# Patient Record
Sex: Male | Born: 1980 | Race: White | Hispanic: No | Marital: Single
Health system: Southern US, Community
[De-identification: ages and names within clinical notes are randomized; demographics above are authoritative.]

## PROBLEM LIST (undated history)

## (undated) ENCOUNTER — Ambulatory Visit: Payer: PRIVATE HEALTH INSURANCE

## (undated) ENCOUNTER — Ambulatory Visit: Payer: BLUE CROSS/BLUE SHIELD | Attending: Urology | Primary: Urology

## (undated) ENCOUNTER — Telehealth
Attending: Student in an Organized Health Care Education/Training Program | Primary: Student in an Organized Health Care Education/Training Program

## (undated) ENCOUNTER — Encounter

## (undated) ENCOUNTER — Ambulatory Visit: Payer: BLUE CROSS/BLUE SHIELD

## (undated) ENCOUNTER — Telehealth

## (undated) ENCOUNTER — Ambulatory Visit
Payer: PRIVATE HEALTH INSURANCE | Attending: Rehabilitative and Restorative Service Providers" | Primary: Rehabilitative and Restorative Service Providers"

## (undated) ENCOUNTER — Ambulatory Visit

## (undated) ENCOUNTER — Encounter: Attending: Urology | Primary: Urology

## (undated) ENCOUNTER — Ambulatory Visit
Payer: PRIVATE HEALTH INSURANCE | Attending: Plastic and Reconstructive Surgery | Primary: Plastic and Reconstructive Surgery

## (undated) ENCOUNTER — Ambulatory Visit: Payer: PRIVATE HEALTH INSURANCE | Attending: Spinal Cord Injury Medicine | Primary: Spinal Cord Injury Medicine

## (undated) ENCOUNTER — Encounter: Attending: Internal Medicine | Primary: Internal Medicine

## (undated) ENCOUNTER — Encounter
Attending: Student in an Organized Health Care Education/Training Program | Primary: Student in an Organized Health Care Education/Training Program

## (undated) ENCOUNTER — Inpatient Hospital Stay

## (undated) ENCOUNTER — Ambulatory Visit
Payer: PRIVATE HEALTH INSURANCE | Attending: Student in an Organized Health Care Education/Training Program | Primary: Student in an Organized Health Care Education/Training Program

## (undated) ENCOUNTER — Encounter: Attending: Spinal Cord Injury Medicine | Primary: Spinal Cord Injury Medicine

## (undated) ENCOUNTER — Ambulatory Visit: Attending: Rheumatology | Primary: Rheumatology

## (undated) ENCOUNTER — Ambulatory Visit: Payer: BLUE CROSS/BLUE SHIELD | Attending: Internal Medicine | Primary: Internal Medicine

## (undated) ENCOUNTER — Ambulatory Visit: Attending: Pharmacist | Primary: Pharmacist

## (undated) ENCOUNTER — Ambulatory Visit: Payer: BLUE CROSS/BLUE SHIELD | Attending: Spinal Cord Injury Medicine | Primary: Spinal Cord Injury Medicine

## (undated) DIAGNOSIS — R569 Unspecified convulsions: Secondary | ICD-10-CM

## (undated) DIAGNOSIS — K5090 Crohn's disease, unspecified, without complications: Secondary | ICD-10-CM

## (undated) DIAGNOSIS — M4690 Unspecified inflammatory spondylopathy, site unspecified: Secondary | ICD-10-CM

## (undated) HISTORY — PX: SPINE SURGERY: SHX786

---

## 2009-06-13 ENCOUNTER — Emergency Department: Payer: Self-pay

## 2010-05-17 ENCOUNTER — Ambulatory Visit: Payer: Self-pay | Admitting: Family Medicine

## 2014-04-24 ENCOUNTER — Emergency Department: Payer: Self-pay | Admitting: Emergency Medicine

## 2014-04-24 LAB — URINALYSIS, COMPLETE
BLOOD: NEGATIVE
Bacteria: NONE SEEN
Bilirubin,UR: NEGATIVE
GLUCOSE, UR: NEGATIVE mg/dL (ref 0–75)
KETONE: NEGATIVE
Leukocyte Esterase: NEGATIVE
NITRITE: NEGATIVE
Ph: 5 (ref 4.5–8.0)
Protein: 100
RBC,UR: NONE SEEN /HPF (ref 0–5)
Specific Gravity: 1.02 (ref 1.003–1.030)
Squamous Epithelial: <1

## 2014-04-24 LAB — DRUG SCREEN, URINE
Amphetamines, Ur Screen: NEGATIVE (ref ?–1000)
BARBITURATES, UR SCREEN: NEGATIVE (ref ?–200)
BENZODIAZEPINE, UR SCRN: NEGATIVE (ref ?–200)
COCAINE METABOLITE, UR ~~LOC~~: NEGATIVE (ref ?–300)
Cannabinoid 50 Ng, Ur ~~LOC~~: POSITIVE (ref ?–50)
MDMA (Ecstasy)Ur Screen: NEGATIVE (ref ?–500)
Methadone, Ur Screen: NEGATIVE (ref ?–300)
Opiate, Ur Screen: NEGATIVE (ref ?–300)
Phencyclidine (PCP) Ur S: NEGATIVE (ref ?–25)
Tricyclic, Ur Screen: NEGATIVE (ref ?–1000)

## 2014-04-24 LAB — COMPREHENSIVE METABOLIC PANEL
ALK PHOS: 67 U/L
ALT: 14 U/L
Albumin: 2.9 g/dL — ABNORMAL LOW (ref 3.4–5.0)
Anion Gap: 8 (ref 7–16)
BUN: 12 mg/dL (ref 7–18)
Bilirubin,Total: 0.3 mg/dL (ref 0.2–1.0)
Calcium, Total: 8.9 mg/dL (ref 8.5–10.1)
Chloride: 107 mmol/L (ref 98–107)
Co2: 26 mmol/L (ref 21–32)
Creatinine: 0.75 mg/dL (ref 0.60–1.30)
EGFR (Non-African Amer.): >60
GLUCOSE: 106 mg/dL — AB (ref 65–99)
Osmolality: 281 (ref 275–301)
POTASSIUM: 3.3 mmol/L — AB (ref 3.5–5.1)
SGOT(AST): 16 U/L (ref 15–37)
Sodium: 141 mmol/L (ref 136–145)
Total Protein: 7.9 g/dL (ref 6.4–8.2)

## 2014-04-24 LAB — TROPONIN I: Troponin-I: <0.02 ng/mL

## 2014-04-24 LAB — CBC
HCT: 33.6 % — AB (ref 40.0–52.0)
HGB: 9.6 g/dL — ABNORMAL LOW (ref 13.0–18.0)
MCH: 18.4 pg — ABNORMAL LOW (ref 26.0–34.0)
MCHC: 28.5 g/dL — ABNORMAL LOW (ref 32.0–36.0)
MCV: 65 fL — ABNORMAL LOW (ref 80–100)
Platelet: 623 10*3/uL — ABNORMAL HIGH (ref 150–440)
RBC: 5.2 10*6/uL (ref 4.40–5.90)
RDW: 18.4 % — AB (ref 11.5–14.5)
WBC: 26.4 10*3/uL — AB (ref 3.8–10.6)

## 2014-04-24 LAB — ETHANOL: Ethanol: <3 mg/dL (ref 0–80)

## 2014-12-09 ENCOUNTER — Encounter: Payer: Self-pay | Admitting: Emergency Medicine

## 2014-12-09 ENCOUNTER — Emergency Department
Admission: EM | Admit: 2014-12-09 | Discharge: 2014-12-09 | Disposition: A | Discharge status: Home / Self Care | Payer: BLUE CROSS/BLUE SHIELD | Attending: Emergency Medicine | Admitting: Emergency Medicine

## 2014-12-09 DIAGNOSIS — R569 Unspecified convulsions: Secondary | ICD-10-CM | POA: Insufficient documentation

## 2014-12-09 HISTORY — DX: Crohn's disease, unspecified, without complications: K50.90

## 2014-12-09 MED ORDER — LEVETIRACETAM 500 MG PO TABS
ORAL_TABLET | ORAL | Status: AC
  Administered 2014-12-09: 500 mg via ORAL
  Filled 2014-12-09: qty 1

## 2014-12-09 MED ORDER — LEVETIRACETAM 500 MG PO TABS
500.0000 mg | ORAL_TABLET | Freq: Once | ORAL | Status: AC
  Administered 2014-12-09: 500 mg via ORAL

## 2014-12-09 MED ORDER — LEVETIRACETAM 500 MG PO TABS: 500.0000 mg | ORAL_TABLET | Freq: Two times a day (BID) | ORAL | Status: DC

## 2014-12-09 NOTE — ED Provider Notes (Signed)
[facility] Emergency Department Provider Note   ____________________________________________  Time seen: On arrival I have reviewed the triage vital signs and the triage nursing note.  HISTORY  Chief Complaint Seizures   Historian Limited as patient is postictal from seizure Mother arrived and provided additional history  HPI Julian Allen is a 34 y.o. male who is currently being worked up by [facility] neurology for seizures. He does have a history of Crohn's and had his first seizure couple weeks ago during a Remicade infusion. He is being worked up as an outpatient and has had an MRI although I don't have the results of this, and he had an EEG in the office this morning. When he got home, his mother witnessed a generalized tonic-clonic seizure which lasted less than 5 minutes. He has had postictal state. He sustained no trauma or injury from the seizure. He's not been ill recently with fever, or other systemic symptoms. His Crohn's doctor is switching him to Humira.    Past Medical History  Diagnosis Date  . Crohn's disease    seizures  There are no active problems to display for this patient.   No past surgical history on file.  Current Outpatient Rx  Name  Route  Sig  Dispense  Refill  . levETIRAcetam (KEPPRA) 500 MG tablet   Oral   Take 1 tablet (500 mg total) by mouth 2 (two) times daily.   60 tablet   0     Allergies Review of patient's allergies indicates no known allergies.  No family history on file.  Social History History  Substance Use Topics  . Smoking status: Not on file  . Smokeless tobacco: Not on file  . Alcohol Use: No    Review of Systems  Constitutional: Negative for fever. Eyes: Negative for visual changes. ENT: Negative for sore throat. Cardiovascular: Negative for chest pain. Respiratory: Negative for shortness of breath. Gastrointestinal: Negative for abdominal pain, vomiting and diarrhea. Genitourinary:  Negative for dysuria. Musculoskeletal: Negative for back pain. Skin: Negative for rash. Neurological: Negative for headaches, focal weakness or numbness.  ____________________________________________   PHYSICAL EXAM:  VITAL SIGNS: ED Triage Vitals  Enc Vitals Group     BP 12/09/14 1548 116/74 mmHg     Pulse Rate 12/09/14 1548 88     Resp 12/09/14 1600 14     Temp 12/09/14 1548 97.7 F (36.5 C)     Temp Source 12/09/14 1548 Oral     SpO2 12/09/14 1548 97 %     Weight 12/09/14 1548 140 lb (63.504 kg)     Height 12/09/14 1548  (1.778 m)     Head Cir --      Peak Flow --      Pain Score --      Pain Loc --      Pain Edu? --      Excl. in GC? --      Constitutional: Alert and oriented. Well appearing and in no distress. Eyes: Conjunctivae are normal. PERRL. Normal extraocular movements. ENT   Head: Normocephalic and atraumatic.   Nose: No congestion/rhinnorhea.   Mouth/Throat: Mucous membranes are moist.   Neck: No stridor. Cardiovascular: Normal rate, regular rhythm.  No murmurs, rubs, or gallops. Respiratory: Normal respiratory effort without tachypnea nor retractions. Breath sounds are clear and equal bilaterally. No wheezes/rales/rhonchi. Gastrointestinal: Soft and nontender. No distention.  Genitourinary: Musculoskeletal: Nontender with normal range of motion in all extremities. No joint effusions.  No lower extremity  tenderness nor edema. Neurologic:  Normal speech, but some confusion. No gross focal neurologic deficits are appreciated. Skin:  Skin is warm, dry and intact. No rash noted. Psychiatric: Mood and affect are normal. Speech and behavior are normal. Patient exhibits appropriate insight and judgment.  ____________________________________________   EKG  None ____________________________________________  LABS (pertinent positives/negatives)  None  ____________________________________________  RADIOLOGY Radiologist results  reviewed  None __________________________________________  PROCEDURES  Procedure(s) performed: None Critical Care performed: None  ____________________________________________   ED COURSE / ASSESSMENT AND PLAN  Pertinent labs & imaging results that were available during my care of the patient were reviewed by me and considered in my medical decision making (see chart for details).   Patient didn't improve from postictal state back to his normal mental status. There are no symptoms to make me concern for meningitis stroke, or other cause for a seizure. He is currently being worked up by [facility] neurology and I was able to talk with his treating neurologist Dr. Vernie Ammons who recommend starting patient on 500 mg Keppra twice a day and follow-up in the office in about 2 weeks as the EEG results will be back by then.  Return precautions and discharge instructions were given to patient and his mother. He was also given no driving instructions due to seizures until cleared by neurology.   ___________________________________________   FINAL CLINICAL IMPRESSION(S) / ED DIAGNOSES   Final diagnoses:  Seizure      Governor Rooks, MD 12/09/14 [phone]

## 2014-12-09 NOTE — Discharge Instructions (Signed)
After discussion with your neurologist, he recommended starting Keppra 500 mg twice a day. The results from the EEG won't be available for 1-2 weeks. He does want you to in follow-up, and just call the office to make an appointment. Return to the emergency department for any new or worsening condition including a fever, headache, altered mental status, seizure lasting longer than 5 minutes, or any other condition concerning to you.   No driving, operating machinery, climbing to heights until released by the neurologist.  Seizure, Adult A seizure is abnormal electrical activity in the brain. Seizures usually last from 30 seconds to 2 minutes. There are various types of seizures. Before a seizure, you may have a warning sensation (aura) that a seizure is about to occur. An aura may include the following symptoms:   Fear or anxiety.  Nausea.  Feeling like the room is spinning (vertigo).  Vision changes, such as seeing flashing lights or spots. Common symptoms during a seizure include:  A change in attention or behavior (altered mental status).  Convulsions with rhythmic jerking movements.  Drooling.  Rapid eye movements.  Grunting.  Loss of bladder and bowel control.  Bitter taste in the mouth.  Tongue biting. After a seizure, you may feel confused and sleepy. You may also have an injury resulting from convulsions during the seizure. HOME CARE INSTRUCTIONS   If you are given medicines, take them exactly as prescribed by your health care provider.  Keep all follow-up appointments as directed by your health care provider.  Do not swim or drive or engage in risky activity during which a seizure could cause further injury to you or others until your health care provider says it is OK.  Get adequate rest.  Teach friends and family what to do if you have a seizure. They should:  Lay you on the ground to prevent a fall.  Put a cushion under your head.  Loosen any tight clothing  around your neck.  Turn you on your side. If vomiting occurs, this helps keep your airway clear.  Stay with you until you recover.  Know whether or not you need emergency care. SEEK IMMEDIATE MEDICAL CARE IF:  The seizure lasts longer than 5 minutes.  The seizure is severe or you do not wake up immediately after the seizure.  You have an altered mental status after the seizure.  You are having more frequent or worsening seizures. Someone should drive you to the emergency department or call local emergency services (911 in [place] MAKE SURE YOU:  Understand these instructions.  Will watch your condition.  Will get help right away if you are not doing well or get worse. Document Released: 06/24/2000 Document Revised: 04/17/2013 Document Reviewed: 02/06/2013 [facility] Patient Information 2015 [place], [place]. This information is not intended to replace advice given to you by your health care provider. Make sure you discuss any questions you have with your health care provider.

## 2014-12-09 NOTE — ED Notes (Signed)
Pt ambulated to bathroom with no difficulty 

## 2014-12-09 NOTE — ED Notes (Signed)
Ems from home. Pt with witness seizure by mom. First seizure 2 months ago during remecaid infusion. Being worked up by [employer] neurology.

## 2016-07-04 ENCOUNTER — Emergency Department
Admission: EM | Admit: 2016-07-04 | Discharge: 2016-07-04 | Disposition: A | Discharge status: Home / Self Care | Payer: BLUE CROSS/BLUE SHIELD | Attending: Emergency Medicine | Admitting: Emergency Medicine

## 2016-07-04 ENCOUNTER — Encounter: Payer: Self-pay | Admitting: Emergency Medicine

## 2016-07-04 DIAGNOSIS — M7989 Other specified soft tissue disorders: Secondary | ICD-10-CM | POA: Diagnosis present

## 2016-07-04 DIAGNOSIS — L03113 Cellulitis of right upper limb: Secondary | ICD-10-CM | POA: Insufficient documentation

## 2016-07-04 HISTORY — DX: Unspecified inflammatory spondylopathy, site unspecified: M46.90

## 2016-07-04 LAB — CBC WITH DIFFERENTIAL/PLATELET
BASOS PCT: 1 %
Basophils Absolute: 0.1 10*3/uL (ref 0–0.1)
Eosinophils Absolute: 0.2 10*3/uL (ref 0–0.7)
Eosinophils Relative: 2 %
HEMATOCRIT: 37.2 % — AB (ref 40.0–52.0)
HEMOGLOBIN: 12.7 g/dL — AB (ref 13.0–18.0)
LYMPHS PCT: 10 %
Lymphs Abs: 1.2 10*3/uL (ref 1.0–3.6)
MCH: 34.7 pg — ABNORMAL HIGH (ref 26.0–34.0)
MCHC: 34.3 g/dL (ref 32.0–36.0)
MCV: 101.4 fL — AB (ref 80.0–100.0)
MONO ABS: 1 10*3/uL (ref 0.2–1.0)
MONOS PCT: 8 %
NEUTROS ABS: 9.9 10*3/uL — AB (ref 1.4–6.5)
NEUTROS PCT: 79 %
Platelets: 251 10*3/uL (ref 150–440)
RBC: 3.66 MIL/uL — ABNORMAL LOW (ref 4.40–5.90)
RDW: 16.2 % — ABNORMAL HIGH (ref 11.5–14.5)
WBC: 12.4 10*3/uL — ABNORMAL HIGH (ref 3.8–10.6)

## 2016-07-04 LAB — BASIC METABOLIC PANEL
ANION GAP: 7 (ref 5–15)
BUN: 15 mg/dL (ref 6–20)
CHLORIDE: 104 mmol/L (ref 101–111)
CO2: 27 mmol/L (ref 22–32)
CREATININE: 0.78 mg/dL (ref 0.61–1.24)
Calcium: 9.3 mg/dL (ref 8.9–10.3)
GFR calc non Af Amer: >60 mL/min (ref >60)
GLUCOSE: 84 mg/dL (ref 65–99)
Potassium: 3.7 mmol/L (ref 3.5–5.1)
Sodium: 138 mmol/L (ref 135–145)

## 2016-07-04 LAB — LACTIC ACID, PLASMA: Lactic Acid, Venous: 0.9 mmol/L (ref 0.5–1.9)

## 2016-07-04 MED ORDER — CEPHALEXIN 500 MG PO CAPS: 500.0000 mg | ORAL_CAPSULE | Freq: Four times a day (QID) | ORAL | 0 refills | Status: AC

## 2016-07-04 MED ORDER — VANCOMYCIN HCL IN DEXTROSE 1-5 GM/200ML-% IV SOLN
1000.0000 mg | Freq: Once | INTRAVENOUS | Status: AC
  Administered 2016-07-04: 1000 mg via INTRAVENOUS
  Filled 2016-07-04: qty 200

## 2016-07-04 NOTE — ED Triage Notes (Signed)
Pt c/o R hand swelling without injury. Pt states woke up this morning and his hand was swelling. Pt c/o mild tenderness between the 2nd and 3rd knuckle and in his upper forearm. Pt maintains sensation and movement at this time. Pt noted to have a small abrasion to his R pointer finger.

## 2016-07-04 NOTE — ED Notes (Signed)
AAOx3.  Skin warm and dry.  Ambulates with easy and steady gait. NAD 

## 2016-07-04 NOTE — Discharge Instructions (Signed)
Please seek medical attention for any high fevers, chest pain, shortness of breath, change in behavior, persistent vomiting, bloody stool or any other new or concerning symptoms.  

## 2016-07-04 NOTE — ED Provider Notes (Signed)
[facility] Emergency Department Provider Note  _____________________________________   I have reviewed the triage vital signs and the nursing notes.   HISTORY  Chief Complaint Hand Swelling   History limited by: Not Limited   HPI Julian Allen is a 35 y.o. male who presents to the emergency department today because right hand swelling. The patient noticed this symptom when he woke up today. He does not recall any injury to his right hand. States it was normal and he went to bed last night. He states he only has very minimal pain. He is able to grip his hand without any significant discomfort. He states that he did suffer a little scratch to the lateral aspect of his index finger. He is not sure how this happened. Patient is on Humira. He denies any fevers. No nausea or vomiting.   Past Medical History:  Diagnosis Date  . Crohn's disease (HCC)   . Spondylitis (HCC)     There are no active problems to display for this patient.   Past Surgical History:  Procedure Laterality Date  . SPINE SURGERY      Prior to Admission medications   Medication Sig Start Date End Date Taking? Authorizing Provider  levETIRAcetam (KEPPRA) 500 MG tablet Take 1 tablet (500 mg total) by mouth 2 (two) times daily. 12/09/14   Governor Rooksebecca Lord, MD    Allergies Remicade [infliximab]  No family history on file.  Social History Social History  Substance Use Topics  . Smoking status: Never Smoker  . Smokeless tobacco: Never Used  . Alcohol use Yes     Comment: Occ    Review of Systems  Constitutional: Negative for fever. Cardiovascular: Negative for chest pain. Respiratory: Negative for shortness of breath. Gastrointestinal: Negative for abdominal pain, vomiting and diarrhea. Genitourinary: Negative for dysuria. Musculoskeletal: Negative for back pain. Skin: Positive for right hand swelling. Neurological: Negative for headaches, focal weakness or numbness.  10-point  ROS otherwise negative.  ____________________________________________   PHYSICAL EXAM:  VITAL SIGNS: ED Triage Vitals  Enc Vitals Group     BP 07/04/16 1652 128/71     Pulse Rate 07/04/16 1652 79     Resp 07/04/16 1652 18     Temp 07/04/16 1652 98 F (36.7 C)     Temp Source 07/04/16 1652 Oral     SpO2 07/04/16 1652 100 %     Weight 07/04/16 1653 165 lb (74.8 kg)     Height 07/04/16 1653 5\' 10"  (1.778 m)     Head Circumference --      Peak Flow --      Pain Score 07/04/16 1653 4     Pain Loc --      Pain Edu? --      Excl. in GC? --      Constitutional: Alert and oriented. Well appearing and in no distress. Eyes: Conjunctivae are normal. Normal extraocular movements. ENT   Head: Normocephalic and atraumatic.   Nose: No congestion/rhinnorhea.   Mouth/Throat: Mucous membranes are moist.   Neck: No stridor. Hematological/Lymphatic/Immunilogical: No cervical lymphadenopathy. Cardiovascular: Normal rate, regular rhythm.  No murmurs, rubs, or gallops.  Respiratory: Normal respiratory effort without tachypnea nor retractions. Breath sounds are clear and equal bilaterally. No wheezes/rales/rhonchi. Gastrointestinal: Soft and non tender. No rebound. No guarding.  Genitourinary: Deferred Musculoskeletal: Normal range of motion in all extremities. Right hand swelling. Neurologic:  Normal speech and language. No gross focal neurologic deficits are appreciated.  Skin:  Swelling to right hand,  some erythema noted. Small superficial laceration to lateral aspect of index finger. No pus. No fluctuance. No pain with extension of finger. Psychiatric: Mood and affect are normal. Speech and behavior are normal. Patient exhibits appropriate insight and judgment.  ____________________________________________    LABS (pertinent positives/negatives)  Labs Reviewed  CBC WITH DIFFERENTIAL/PLATELET - Abnormal; Notable for the following:       Result Value   WBC 12.4 (*)    RBC  3.66 (*)    Hemoglobin 12.7 (*)    HCT 37.2 (*)    MCV 101.4 (*)    MCH 34.7 (*)    RDW 16.2 (*)    Neutro Abs 9.9 (*)    All other components within normal limits  CULTURE, BLOOD (ROUTINE X 2)  CULTURE, BLOOD (ROUTINE X 2)  LACTIC ACID, PLASMA  BASIC METABOLIC PANEL    ____________________________________________   EKG  None  ____________________________________________    RADIOLOGY  None  ____________________________________________   PROCEDURES  Procedures  ____________________________________________   INITIAL IMPRESSION / ASSESSMENT AND PLAN / ED COURSE  Pertinent labs & imaging results that were available during my care of the patient were reviewed by me and considered in my medical decision making (see chart for details).  Patient presented to the emergency department today with concerns for right hand swelling. Exam did believe he has cellulitis. No evidence of deeper infection or tenosynovitis. The patient was given dose of IV vancomycin here. Blood work shows a mild leukocytosis. Feel patient is appropriate for trial of outpatient antibiotic therapy. Discussed return precautions with the patient.  ____________________________________________   FINAL CLINICAL IMPRESSION(S) / ED DIAGNOSES  Final diagnoses:  Cellulitis of right upper extremity     Note: This dictation was prepared with Dragon dictation. Any transcriptional errors that result from this process are unintentional     Phineas Semen, MD 07/04/16 1842

## 2016-07-09 LAB — CULTURE, BLOOD (ROUTINE X 2)
CULTURE: NO GROWTH
Culture: NO GROWTH

## 2016-08-28 ENCOUNTER — Encounter: Payer: Self-pay | Admitting: Emergency Medicine

## 2016-08-28 ENCOUNTER — Emergency Department
Admission: EM | Admit: 2016-08-28 | Discharge: 2016-08-29 | Disposition: A | Discharge status: Home / Self Care | Payer: BLUE CROSS/BLUE SHIELD | Attending: Student in an Organized Health Care Education/Training Program | Admitting: Student in an Organized Health Care Education/Training Program

## 2016-08-28 DIAGNOSIS — F1290 Cannabis use, unspecified, uncomplicated: Secondary | ICD-10-CM | POA: Insufficient documentation

## 2016-08-28 DIAGNOSIS — T148XXA Other injury of unspecified body region, initial encounter: Secondary | ICD-10-CM

## 2016-08-28 DIAGNOSIS — L089 Local infection of the skin and subcutaneous tissue, unspecified: Secondary | ICD-10-CM | POA: Diagnosis present

## 2016-08-28 DIAGNOSIS — L03031 Cellulitis of right toe: Secondary | ICD-10-CM | POA: Insufficient documentation

## 2016-08-28 DIAGNOSIS — Z79899 Other long term (current) drug therapy: Secondary | ICD-10-CM | POA: Diagnosis not present

## 2016-08-28 LAB — CBC WITH DIFFERENTIAL/PLATELET
Basophils Absolute: 0.1 10*3/uL (ref 0–0.1)
Basophils Relative: 1 %
Eosinophils Absolute: 0.3 10*3/uL (ref 0–0.7)
Eosinophils Relative: 3 %
HCT: 34.9 % — ABNORMAL LOW (ref 40.0–52.0)
Hemoglobin: 12.3 g/dL — ABNORMAL LOW (ref 13.0–18.0)
LYMPHS ABS: 1.3 10*3/uL (ref 1.0–3.6)
LYMPHS PCT: 12 %
MCH: 35.6 pg — AB (ref 26.0–34.0)
MCHC: 35.4 g/dL (ref 32.0–36.0)
MCV: 100.5 fL — AB (ref 80.0–100.0)
Monocytes Absolute: 0.8 10*3/uL (ref 0.2–1.0)
Monocytes Relative: 8 %
NEUTROS PCT: 76 %
Neutro Abs: 8 10*3/uL — ABNORMAL HIGH (ref 1.4–6.5)
Platelets: 198 10*3/uL (ref 150–440)
RBC: 3.47 MIL/uL — AB (ref 4.40–5.90)
RDW: 16.3 % — ABNORMAL HIGH (ref 11.5–14.5)
WBC: 10.5 10*3/uL (ref 3.8–10.6)

## 2016-08-28 LAB — BASIC METABOLIC PANEL
Anion gap: 5 (ref 5–15)
BUN: 13 mg/dL (ref 6–20)
CHLORIDE: 108 mmol/L (ref 101–111)
CO2: 26 mmol/L (ref 22–32)
Calcium: 9 mg/dL (ref 8.9–10.3)
Creatinine, Ser: 0.77 mg/dL (ref 0.61–1.24)
GFR calc non Af Amer: >60 mL/min (ref >60)
Glucose, Bld: 122 mg/dL — ABNORMAL HIGH (ref 65–99)
POTASSIUM: 3.6 mmol/L (ref 3.5–5.1)
SODIUM: 139 mmol/L (ref 135–145)

## 2016-08-28 MED ORDER — CLINDAMYCIN PHOSPHATE 600 MG/50ML IV SOLN
600.0000 mg | Freq: Once | INTRAVENOUS | Status: AC
  Administered 2016-08-28: 600 mg via INTRAVENOUS
  Filled 2016-08-28: qty 50

## 2016-08-28 NOTE — ED Triage Notes (Signed)
Pt states is on humira and has a laceration to right third anterior toe. Pt has redness surrounding laceration. Pt states has been present for "a couple of days".

## 2016-08-28 NOTE — ED Notes (Signed)
Pt with dime sized open area consistent with laceration/blister noted to base of 3rd right toe. Pt has redness surrounding laceration extending to approx 10% of base into foot itself. Pt states area is sore, tender to touch. serorsanginous drainage noted.

## 2016-08-29 MED ORDER — CLINDAMYCIN HCL 300 MG PO CAPS: 300.0000 mg | ORAL_CAPSULE | Freq: Four times a day (QID) | ORAL | 0 refills | Status: AC

## 2016-08-29 MED ORDER — BACITRACIN ZINC 500 UNIT/GM EX OINT
TOPICAL_OINTMENT | Freq: Once | CUTANEOUS | Status: AC
  Administered 2016-08-29: 1 via TOPICAL
  Filled 2016-08-29: qty 0.9

## 2016-08-29 NOTE — Discharge Instructions (Signed)
Keep the wound clean, dry, and covered. Wash only with soap & water. Rest with the foot elevated. Take the antibiotic as directed. Follow-up with your provider, [facility], or return to the ED as needed for worsening symptoms.

## 2016-08-29 NOTE — ED Provider Notes (Signed)
[facility] Emergency Department Provider Note ____________________________________________  Time seen: 2255  I have reviewed the triage vital signs and the nursing notes.  HISTORY  Chief Complaint  Wound Infection  HPI Julian Allen is a 36 y.o. male visits to the ED for evaluation of a wound to the top of his third toe on the right foot. He describes and small superficial wound or cut to the top of the third toe, is been there for about 3 days. About 24 hours ago however, he began to noticed increased redness, swelling, and streaking up his dorsal right foot. He's been managing one in the interim with daily hygiene peroxide washes. He denies any interim fevers, chills, sweats. He denies any history of cellulitis in the past.  Past Medical History:  Diagnosis Date  . Crohn's disease (HCC)   . Spondylitis (HCC)     There are no active problems to display for this patient.   Past Surgical History:  Procedure Laterality Date  . SPINE SURGERY      Prior to Admission medications   Medication Sig Start Date End Date Taking? Authorizing Provider  clindamycin (CLEOCIN) 300 MG capsule Take 1 capsule (300 mg total) by mouth 4 (four) times daily. 08/29/16 09/08/16  Jaken Fregia V Bacon Marquet Faircloth, PA-C  levETIRAcetam (KEPPRA) 500 MG tablet Take 1 tablet (500 mg total) by mouth 2 (two) times daily. 12/09/14   Governor Rooks, MD    Allergies Remicade [infliximab]  History reviewed. No pertinent family history.  Social History Social History  Substance Use Topics  . Smoking status: Never Smoker  . Smokeless tobacco: Never Used  . Alcohol use Yes     Comment: Occ    Review of Systems  Constitutional: Negative for fever. Cardiovascular: Negative for chest pain. Respiratory: Negative for shortness of breath. Musculoskeletal: Negative for back pain. Skin: Negative for rash. Right foot cellulitis as above Neurological: Negative for headaches, focal weakness or  numbness. ____________________________________________  PHYSICAL EXAM:  VITAL SIGNS: ED Triage Vitals  Enc Vitals Group     BP 08/28/16 2212 121/74     Pulse Rate 08/28/16 2212 61     Resp 08/28/16 2212 16     Temp 08/28/16 2212 98.1 F (36.7 C)     Temp Source 08/28/16 2212 Oral     SpO2 08/28/16 2212 100 %     Weight 08/28/16 2212 170 lb (77.1 kg)     Height 08/28/16 2212 5\' 10"  (1.778 m)     Head Circumference --      Peak Flow --      Pain Score 08/28/16 2213 7     Pain Loc --      Pain Edu? --      Excl. in GC? --    Constitutional: Alert and oriented. Well appearing and in no distress. Head: Normocephalic and atraumatic. Cardiovascular: Normal rate, regular rhythm. Normal distal pulses. Respiratory: Normal respiratory effort. No wheezes/rales/rhonchi. Gastrointestinal: Soft and nontender. No distention. Musculoskeletal: Nontender with normal range of motion in all extremities.  Neurologic:  Normal gait without ataxia. Normal speech and language. No gross focal neurologic deficits are appreciated. Skin:  Skin is warm, dry and intact. No rash noted. Patient with a superficial, circumferential wound to the dorsal aspect of the right third toe. It appears to be a blister was not intact. There is local erythema noted. There is also spreading of erythema proximally up the dorsal foot. Erythematous streak is also noted at the dorsal aspect of  the distal ankle and shin. ____________________________________________   LABS (pertinent positives/negatives) Labs Reviewed  BASIC METABOLIC PANEL - Abnormal; Notable for the following:       Result Value   Glucose, Bld 122 (*)    All other components within normal limits  CBC WITH DIFFERENTIAL/PLATELET - Abnormal; Notable for the following:    RBC 3.47 (*)    Hemoglobin 12.3 (*)    HCT 34.9 (*)    MCV 100.5 (*)    MCH 35.6 (*)    RDW 16.3 (*)    Neutro Abs 8.0 (*)    All other components within normal limits   ____________________________________________  PROCEDURES  Clindamycin 600 mg IVP Bacitracin ointment  Dressing  ____________________________________________  INITIAL IMPRESSION / ASSESSMENT AND PLAN / ED COURSE  Patient with a local cellulitis secondary to infected foot wound. His labs are reassuring at this time. Patient appears to be stable for outpatient management of his acute cellulitis. He will be discharged with a prescription for clindamycin to dose 3 times a day for the next 10 days. He is advised to keep the wound clean, dry, and covered.; washing only with soap and water daily. He will keep the foot elevated when seated. He is advised to follow-up with his primary care provider or return to the ED for worsening signs of infection as discussed. ____________________________________________  FINAL CLINICAL IMPRESSION(S) / ED DIAGNOSES  Final diagnoses:  Cellulitis of toe of right foot  Wound infection      Lissa Hoard, PA-C 08/29/16 0026    Willy Eddy, MD 08/29/16 1040

## 2017-01-13 MED ORDER — HUMIRA PEN 40 MG/0.8 ML SUBCUTANEOUS KIT
5 refills | 0 days | Status: CP
Start: 2017-01-13 — End: 2018-06-28

## 2017-03-06 MED ORDER — AZATHIOPRINE 50 MG TABLET
ORAL_TABLET | 0 refills | 0 days | Status: CP
Start: 2017-03-06 — End: 2017-04-05

## 2017-04-05 MED ORDER — AZATHIOPRINE 50 MG TABLET
ORAL_TABLET | Freq: Every day | ORAL | 0 refills | 0.00000 days | Status: CP
Start: 2017-04-05 — End: 2017-05-05

## 2017-04-18 ENCOUNTER — Ambulatory Visit
Admission: RE | Admit: 2017-04-18 | Discharge: 2017-04-18 | Disposition: A | Discharge status: Home / Self Care | Payer: BC Managed Care – PPO | Attending: Student in an Organized Health Care Education/Training Program | Admitting: Student in an Organized Health Care Education/Training Program

## 2017-04-18 DIAGNOSIS — K5010 Crohn's disease of large intestine without complications: Principal | ICD-10-CM

## 2017-04-18 DIAGNOSIS — K5080 Crohn's disease of both small and large intestine without complications: Secondary | ICD-10-CM

## 2017-05-05 MED ORDER — AZATHIOPRINE 50 MG TABLET
ORAL_TABLET | 2 refills | 0 days | Status: CP
Start: 2017-05-05 — End: 2017-08-01

## 2017-05-08 ENCOUNTER — Ambulatory Visit
Admission: RE | Admit: 2017-05-08 | Discharge: 2017-05-08 | Disposition: A | Discharge status: Home / Self Care | Attending: Rheumatology | Admitting: Rheumatology

## 2017-05-08 DIAGNOSIS — M0760 Enteropathic arthropathies, unspecified site: Secondary | ICD-10-CM

## 2017-05-08 DIAGNOSIS — K639 Disease of intestine, unspecified: Principal | ICD-10-CM

## 2017-08-01 MED ORDER — AZATHIOPRINE 50 MG TABLET
ORAL_TABLET | 0 refills | 0 days | Status: CP
Start: 2017-08-01 — End: 2017-08-30

## 2017-08-25 ENCOUNTER — Ambulatory Visit: Admit: 2017-08-25 | Discharge: 2017-08-26

## 2017-08-25 DIAGNOSIS — K5080 Crohn's disease of both small and large intestine without complications: Principal | ICD-10-CM

## 2017-08-30 MED ORDER — AZATHIOPRINE 50 MG TABLET
ORAL_TABLET | 0 refills | 0 days | Status: CP
Start: 2017-08-30 — End: 2018-02-24

## 2017-10-28 ENCOUNTER — Emergency Department: Payer: Self-pay

## 2017-10-28 ENCOUNTER — Other Ambulatory Visit: Payer: Self-pay

## 2017-10-28 ENCOUNTER — Emergency Department
Admission: EM | Admit: 2017-10-28 | Discharge: 2017-10-28 | Disposition: A | Discharge status: Home / Self Care | Payer: Self-pay | Attending: Emergency Medicine | Admitting: Emergency Medicine

## 2017-10-28 ENCOUNTER — Encounter: Payer: Self-pay | Admitting: Emergency Medicine

## 2017-10-28 DIAGNOSIS — Z9114 Patient's other noncompliance with medication regimen: Secondary | ICD-10-CM | POA: Insufficient documentation

## 2017-10-28 DIAGNOSIS — G40909 Epilepsy, unspecified, not intractable, without status epilepticus: Secondary | ICD-10-CM | POA: Insufficient documentation

## 2017-10-28 DIAGNOSIS — Z79899 Other long term (current) drug therapy: Secondary | ICD-10-CM | POA: Insufficient documentation

## 2017-10-28 DIAGNOSIS — R451 Restlessness and agitation: Secondary | ICD-10-CM | POA: Insufficient documentation

## 2017-10-28 HISTORY — DX: Unspecified convulsions: R56.9

## 2017-10-28 LAB — CBC WITH DIFFERENTIAL/PLATELET
Basophils Absolute: 0.1 10*3/uL (ref 0–0.1)
Basophils Relative: 1 %
EOS ABS: 0.3 10*3/uL (ref 0–0.7)
Eosinophils Relative: 2 %
HCT: 43.8 % (ref 40.0–52.0)
HEMOGLOBIN: 14.9 g/dL (ref 13.0–18.0)
LYMPHS ABS: 1.9 10*3/uL (ref 1.0–3.6)
Lymphocytes Relative: 18 %
MCH: 32.7 pg (ref 26.0–34.0)
MCHC: 34 g/dL (ref 32.0–36.0)
MCV: 96.3 fL (ref 80.0–100.0)
MONOS PCT: 5 %
Monocytes Absolute: 0.5 10*3/uL (ref 0.2–1.0)
NEUTROS PCT: 74 %
Neutro Abs: 8.3 10*3/uL — ABNORMAL HIGH (ref 1.4–6.5)
Platelets: 272 10*3/uL (ref 150–440)
RBC: 4.55 MIL/uL (ref 4.40–5.90)
RDW: 14.5 % (ref 11.5–14.5)
WBC: 11.1 10*3/uL — ABNORMAL HIGH (ref 3.8–10.6)

## 2017-10-28 LAB — ETHANOL

## 2017-10-28 LAB — BASIC METABOLIC PANEL
Anion gap: 12 (ref 5–15)
BUN: 15 mg/dL (ref 6–20)
CHLORIDE: 107 mmol/L (ref 101–111)
CO2: 17 mmol/L — AB (ref 22–32)
CREATININE: 0.93 mg/dL (ref 0.61–1.24)
Calcium: 9 mg/dL (ref 8.9–10.3)
GFR calc non Af Amer: >60 mL/min (ref >60)
Glucose, Bld: 128 mg/dL — ABNORMAL HIGH (ref 65–99)
Potassium: 3.7 mmol/L (ref 3.5–5.1)
Sodium: 136 mmol/L (ref 135–145)

## 2017-10-28 MED ORDER — KETOROLAC TROMETHAMINE 30 MG/ML IJ SOLN
INTRAMUSCULAR | Status: AC
  Administered 2017-10-28: 30 mg via INTRAVENOUS
  Filled 2017-10-28: qty 1

## 2017-10-28 MED ORDER — LEVETIRACETAM 500 MG PO TABS: 500.0000 mg | ORAL_TABLET | Freq: Two times a day (BID) | ORAL | 1 refills | Status: DC

## 2017-10-28 MED ORDER — SODIUM CHLORIDE 0.9 % IV SOLN
1000.0000 mg | Freq: Once | INTRAVENOUS | Status: AC
  Administered 2017-10-28: 1000 mg via INTRAVENOUS
  Filled 2017-10-28: qty 10

## 2017-10-28 MED ORDER — LEVETIRACETAM IN NACL 1000 MG/100ML IV SOLN: 1000.0000 mg | Freq: Once | INTRAVENOUS | Status: DC

## 2017-10-28 MED ORDER — LORAZEPAM 2 MG/ML IJ SOLN
1.0000 mg | Freq: Once | INTRAMUSCULAR | Status: AC
  Administered 2017-10-28: 1 mg via INTRAVENOUS
  Filled 2017-10-28: qty 1

## 2017-10-28 MED ORDER — KETOROLAC TROMETHAMINE 30 MG/ML IJ SOLN
30.0000 mg | Freq: Once | INTRAMUSCULAR | Status: AC
Start: 2017-10-28 — End: 2017-10-28
  Administered 2017-10-28: 30 mg via INTRAVENOUS

## 2017-10-28 MED ORDER — LEVETIRACETAM IN NACL 1000 MG/100ML IV SOLN
1000.0000 mg | Freq: Once | INTRAVENOUS | Status: DC
  Filled 2017-10-28: qty 100

## 2017-10-28 NOTE — ED Notes (Signed)
Pt calm and cooperative at this time. Understands why he is here and that he is being monitored s/p seizure.

## 2017-10-28 NOTE — ED Notes (Signed)
Pt ambulatory upon discharge; refused wheel chair. Pt would not listen to discharge instructions at first. Eventually, pt did listen to instructions and verbalized understanding of instructions, prescription and follow-up care. VSS. Skin warm and dry.

## 2017-10-28 NOTE — Discharge Instructions (Addendum)
You should start taking your Keppra again today (we have prescribed a 67-month supply).  You need to follow-up with your regular neurologist within the next few weeks.  You not drive or operate heavy machinery until you follow-up with the neurologist.  Return to the ER for new, worsening, recurrent seizures, headache, weakness, or any other new or worsening symptoms that concern you.

## 2017-10-28 NOTE — ED Provider Notes (Signed)
[facility] Emergency Department Provider Note ____________________________________________   First MD Initiated Contact with Patient 10/28/17 [phone]     (approximate)  I have reviewed the triage vital signs and the nursing notes.   HISTORY  Chief Complaint Seizures  Level 5 caveat: History of present illness limited due to altered mental status  HPI Julian Allen is a 37 y.o. male with history of seizure disorder on Keppra who presents with an apparent seizure, acute onset this morning.  It is not clear from EMS whether the seizure was witnessed.  Per EMS, patient had an empty bottle of Keppra.  He is not able to state whether he ran out.  Mother, who arrived afterwards states that he has not taken the Keppra in about 1 month.   Past Medical History:  Diagnosis Date  . Crohn's disease (HCC)   . Seizures (HCC)   . Spondylitis (HCC)     There are no active problems to display for this patient.   Past Surgical History:  Procedure Laterality Date  . SPINE SURGERY      Prior to Admission medications   Medication Sig Start Date End Date Taking? Authorizing Provider  Adalimumab (HUMIRA PEN) 40 MG/0.8ML PNKT Take 40 mg by mouth every 14 (fourteen) days. 01/13/17   [provider]  azaTHIOprine (IMURAN) 50 MG tablet Take 150 mg by mouth daily. 08/01/17   [provider]  levETIRAcetam (KEPPRA) 500 MG tablet Take 1 tablet (500 mg total) by mouth 2 (two) times daily. 10/28/17 12/27/17  Dionne Bucy, MD    Allergies Remicade [infliximab]  No family history on file.  Social History Social History   Tobacco Use  . Smoking status: Never Smoker  . Smokeless tobacco: Never Used  Substance Use Topics  . Alcohol use: Yes    Comment: Occ  . Drug use: Yes    Types: Marijuana    Comment: Occassional    Review of Systems Level 5 caveat: Unable to obtain complete review of systems due to altered mental  status    ____________________________________________   PHYSICAL EXAM:  VITAL SIGNS: ED Triage Vitals  Enc Vitals Group     BP 10/28/17 0849 115/69     Pulse Rate 10/28/17 0849 84     Resp 10/28/17 0849 20     Temp 10/28/17 0849 97.8 F (36.6 C)     Temp Source 10/28/17 0849 Oral     SpO2 10/28/17 0849 96 %     Weight 10/28/17 0850 175 lb (79.4 kg)     Height 10/28/17 0850 5\' 10"  (1.778 m)     Head Circumference --      Peak Flow --      Pain Score 10/28/17 0849 7     Pain Loc --      Pain Edu? --      Excl. in GC? --     Constitutional: Alert, confused.  Comfortable appearing. Eyes: Conjunctivae are normal.  EOMI.  PERRLA. Head: Atraumatic. Nose: No congestion/rhinnorhea. Mouth/Throat: Mucous membranes are moist.  Bite wound to tongue. Neck: Normal range of motion.  No midline cervical spinal tenderness. Cardiovascular: Normal rate, regular rhythm. Grossly normal heart sounds.  Good peripheral circulation. Respiratory: Normal respiratory effort.  No retractions. Lungs CTAB. Gastrointestinal: Soft and nontender. No distention.  Genitourinary: No flank tenderness. Musculoskeletal: No lower extremity edema.  Extremities warm and well perfused.  Neurologic:  Normal speech and language.  Motor and sensory intact in all extremities.  Normal  coordination.  No gross focal neurologic deficits are appreciated.  Skin:  Skin is warm and dry. No rash noted. Psychiatric: Mildly agitated but redirectable.  ____________________________________________   LABS (all labs ordered are listed, but only abnormal results are displayed)  Labs Reviewed  BASIC METABOLIC PANEL - Abnormal; Notable for the following components:      Result Value   CO2 17 (*)    Glucose, Bld 128 (*)    All other components within normal limits  CBC WITH DIFFERENTIAL/PLATELET - Abnormal; Notable for the following components:   WBC 11.1 (*)    Neutro Abs 8.3 (*)    All other components within normal limits   ETHANOL  LEVETIRACETAM LEVEL   ____________________________________________  EKG  ED ECG REPORT I, Dionne Bucy, the attending physician, personally viewed and interpreted this ECG.  Date: 10/28/2017 EKG Time: 855 Rate: 86 Rhythm: normal sinus rhythm QRS Axis: normal Intervals: normal ST/T Wave abnormalities: normal Narrative Interpretation: no evidence of acute ischemia  ____________________________________________  RADIOLOGY  CT C-spine: No acute fracture CT head: No ICH  ____________________________________________   PROCEDURES  Procedure(s) performed: No  Procedures  Critical Care performed: No ____________________________________________   INITIAL IMPRESSION / ASSESSMENT AND PLAN / ED COURSE  Pertinent labs & imaging results that were available during my care of the patient were reviewed by me and considered in my medical decision making (see chart for details).  37 year old male with known seizure disorder who is possibly noncompliant with Keppra presents after an apparent seizure.  Per EMS, the patient was quite combative while en route, however in the ED he was more cooperative.  Patient is confused, repeating similar answers to multiple different questions, however he is aware that he is in the hospital.  He is unable to give much other history.  On exam, vital signs are normal, there is no evidence of trauma except for biting of the tongue, and neuro exam is nonfocal.  Presentation is consistent with seizure with postictal state, which I suspect is likely due to medication noncompliance.  Will obtain basic labs, CT head and C-spine given the fall and history of prior C-spine surgery (and mother's description that the patient f fell from standing height directly onto his head during the seizure), load with IV Keppra, and observe.    ----------------------------------------- 11:12 AM on  10/28/2017 -----------------------------------------  Patient is now ANO x4 and answering all questions appropriately.  His mental status appears to have returned to baseline per his family members.  Lab work-up is unremarkable, and CTs are negative for acute findings.  The patient feels well to go home.  He agrees to resume taking his Keppra, and I counseled him on the importance of this and the risk for recurrent seizures.  We have prescribed a 44-month supply of Keppra, and the patient agrees to follow-up with his neurologist.  Return precautions given, and he expressed understanding.  ____________________________________________   FINAL CLINICAL IMPRESSION(S) / ED DIAGNOSES  Final diagnoses:  Seizure disorder (HCC)      NEW MEDICATIONS STARTED DURING THIS VISIT:  Current Discharge Medication List       Note:  This document was prepared using Dragon voice recognition software and may include unintentional dictation errors.    Dionne Bucy, MD 10/28/17 1112

## 2017-10-28 NOTE — ED Notes (Signed)
Patient transported to CT 

## 2017-10-28 NOTE — ED Notes (Signed)
ED Provider at bedside. 

## 2017-10-28 NOTE — ED Triage Notes (Signed)
Pt arrives via [facility] s/p seizure this morning. Pt was sitting in a rolling chair when he started seizing and hit head. Pt only c/o is a headache at this time. Pt knows name, DOB, that he is in the hospital but unsure which one. Unaware of month and year.  Pt lives with mother. Witnessed fall. Pt hit head during seizure. Mother worried about screw in neck being displaced during the fall.

## 2017-11-02 LAB — LEVETIRACETAM LEVEL: Levetiracetam Lvl: NOT DETECTED ug/mL (ref 10.0–40.0)

## 2018-02-26 MED ORDER — AZATHIOPRINE 50 MG TABLET
ORAL_TABLET | 0 refills | 0 days | Status: CP
Start: 2018-02-26 — End: 2018-04-02

## 2018-03-01 ENCOUNTER — Ambulatory Visit: Admit: 2018-03-01 | Discharge: 2018-03-02 | Payer: PRIVATE HEALTH INSURANCE

## 2018-03-01 DIAGNOSIS — Z79899 Other long term (current) drug therapy: Principal | ICD-10-CM

## 2018-04-02 MED ORDER — AZATHIOPRINE 50 MG TABLET
ORAL_TABLET | 0 refills | 0 days | Status: CP
Start: 2018-04-02 — End: 2018-04-30

## 2018-04-30 MED ORDER — AZATHIOPRINE 50 MG TABLET
ORAL_TABLET | 0 refills | 0 days | Status: CP
Start: 2018-04-30 — End: ?

## 2018-06-26 ENCOUNTER — Ambulatory Visit: Admit: 2018-06-26 | Discharge: 2018-06-27 | Payer: PRIVATE HEALTH INSURANCE

## 2018-06-26 DIAGNOSIS — Z79899 Other long term (current) drug therapy: Secondary | ICD-10-CM

## 2018-06-26 DIAGNOSIS — M459 Ankylosing spondylitis of unspecified sites in spine: Secondary | ICD-10-CM

## 2018-06-26 DIAGNOSIS — K5010 Crohn's disease of large intestine without complications: Principal | ICD-10-CM

## 2018-06-26 DIAGNOSIS — Z598 Other problems related to housing and economic circumstances: Secondary | ICD-10-CM

## 2018-06-26 MED ORDER — AZATHIOPRINE 50 MG TABLET
ORAL_TABLET | Freq: Every day | ORAL | 2 refills | 0 days | Status: CP
Start: 2018-06-26 — End: 2018-09-19

## 2018-06-28 MED ORDER — ADALIMUMAB 80 MG/0.8 ML SUBCUTANEOUS PEN KIT
PACK | 0 refills | 0 days | Status: CP
Start: 2018-06-28 — End: ?

## 2018-06-28 MED ORDER — ADALIMUMAB PEN CITRATE FREE 40 MG/0.4 ML
SUBCUTANEOUS | 6 refills | 0.00000 days | Status: CP
Start: 2018-06-28 — End: ?

## 2018-09-19 MED ORDER — AZATHIOPRINE 50 MG TABLET
ORAL_TABLET | 0 refills | 0 days | Status: CP
Start: 2018-09-19 — End: 2018-10-23

## 2018-10-23 MED ORDER — AZATHIOPRINE 50 MG TABLET
ORAL_TABLET | 0 refills | 0 days | Status: CP
Start: 2018-10-23 — End: 2018-11-19

## 2018-10-25 ENCOUNTER — Encounter: Admit: 2018-10-25 | Discharge: 2018-10-26 | Payer: PRIVATE HEALTH INSURANCE

## 2018-10-25 DIAGNOSIS — Z79899 Other long term (current) drug therapy: Principal | ICD-10-CM

## 2018-11-19 MED ORDER — AZATHIOPRINE 50 MG TABLET
ORAL_TABLET | 0 refills | 0 days | Status: CP
Start: 2018-11-19 — End: 2018-12-17

## 2018-12-17 MED ORDER — AZATHIOPRINE 50 MG TABLET
ORAL_TABLET | 1 refills | 0 days | Status: CP
Start: 2018-12-17 — End: 2019-03-11

## 2019-03-11 MED ORDER — AZATHIOPRINE 50 MG TABLET
ORAL_TABLET | 0 refills | 0 days | Status: CP
Start: 2019-03-11 — End: ?

## 2019-03-12 ENCOUNTER — Non-Acute Institutional Stay: Admit: 2019-03-12 | Discharge: 2019-03-13 | Payer: PRIVATE HEALTH INSURANCE

## 2019-03-12 DIAGNOSIS — Z5181 Encounter for therapeutic drug level monitoring: Secondary | ICD-10-CM

## 2019-03-12 DIAGNOSIS — K5010 Crohn's disease of large intestine without complications: Secondary | ICD-10-CM

## 2019-03-12 DIAGNOSIS — Z79899 Other long term (current) drug therapy: Secondary | ICD-10-CM

## 2019-03-12 DIAGNOSIS — R569 Unspecified convulsions: Secondary | ICD-10-CM

## 2019-04-03 MED ORDER — AZATHIOPRINE 50 MG TABLET
ORAL_TABLET | 0 refills | 0 days | Status: CP
Start: 2019-04-03 — End: 2019-04-15

## 2019-04-15 MED ORDER — AZATHIOPRINE 50 MG TABLET
ORAL_TABLET | Freq: Every day | ORAL | 2 refills | 30 days | Status: CP
Start: 2019-04-15 — End: ?

## 2019-04-24 ENCOUNTER — Encounter: Admit: 2019-04-24 | Discharge: 2019-04-25 | Payer: PRIVATE HEALTH INSURANCE

## 2019-04-24 DIAGNOSIS — K5010 Crohn's disease of large intestine without complications: Principal | ICD-10-CM

## 2019-04-24 DIAGNOSIS — Z79899 Other long term (current) drug therapy: Principal | ICD-10-CM

## 2019-04-24 DIAGNOSIS — M459 Ankylosing spondylitis of unspecified sites in spine: Principal | ICD-10-CM

## 2019-04-24 DIAGNOSIS — D508 Other iron deficiency anemias: Principal | ICD-10-CM

## 2019-05-08 MED ORDER — AZATHIOPRINE 50 MG TABLET
ORAL_TABLET | 1 refills | 0 days | Status: CP
Start: 2019-05-08 — End: ?

## 2019-05-27 DIAGNOSIS — K50919 Crohn's disease, unspecified, with unspecified complications: Principal | ICD-10-CM

## 2019-05-27 MED ORDER — HUMIRA PEN CITRATE FREE 40 MG/0.4 ML
PEN_INJECTOR | 2 refills | 0 days | Status: CP
Start: 2019-05-27 — End: ?

## 2019-06-05 MED ORDER — AZATHIOPRINE 50 MG TABLET
ORAL_TABLET | 1 refills | 0 days | Status: CP
Start: 2019-06-05 — End: ?

## 2019-09-09 ENCOUNTER — Encounter: Admit: 2019-09-09 | Discharge: 2019-09-10 | Payer: PRIVATE HEALTH INSURANCE

## 2019-11-07 MED ORDER — AZATHIOPRINE 50 MG TABLET
ORAL_TABLET | 1 refills | 0 days | Status: CP
Start: 2019-11-07 — End: ?

## 2020-01-07 DIAGNOSIS — K5010 Crohn's disease of large intestine without complications: Principal | ICD-10-CM

## 2020-01-07 DIAGNOSIS — D508 Other iron deficiency anemias: Principal | ICD-10-CM

## 2020-01-07 MED ORDER — AZATHIOPRINE 50 MG TABLET
ORAL_TABLET | 0 refills | 0 days | Status: CP
Start: 2020-01-07 — End: ?

## 2020-02-04 ENCOUNTER — Encounter: Admit: 2020-02-04 | Discharge: 2020-02-05 | Payer: PRIVATE HEALTH INSURANCE

## 2020-02-19 MED ORDER — AZATHIOPRINE 50 MG TABLET
ORAL_TABLET | 0 refills | 0 days | Status: CP
Start: 2020-02-19 — End: ?

## 2020-06-08 DIAGNOSIS — K50919 Crohn's disease, unspecified, with unspecified complications: Principal | ICD-10-CM

## 2020-06-10 ENCOUNTER — Encounter: Admit: 2020-06-10 | Discharge: 2020-06-11 | Payer: PRIVATE HEALTH INSURANCE

## 2020-06-10 DIAGNOSIS — K5010 Crohn's disease of large intestine without complications: Principal | ICD-10-CM

## 2020-06-17 DIAGNOSIS — K50919 Crohn's disease, unspecified, with unspecified complications: Principal | ICD-10-CM

## 2020-06-17 MED ORDER — HUMIRA PEN CITRATE FREE 40 MG/0.4 ML
SUBCUTANEOUS | 0 refills | 0.00000 days | Status: CP
Start: 2020-06-17 — End: 2020-08-04

## 2020-07-02 DIAGNOSIS — K50919 Crohn's disease, unspecified, with unspecified complications: Principal | ICD-10-CM

## 2020-07-12 ENCOUNTER — Other Ambulatory Visit: Payer: Self-pay

## 2020-07-12 ENCOUNTER — Emergency Department: Payer: BLUE CROSS/BLUE SHIELD

## 2020-07-12 ENCOUNTER — Emergency Department
Admission: EM | Admit: 2020-07-12 | Discharge: 2020-07-12 | Disposition: A | Discharge status: Home / Self Care | Payer: BLUE CROSS/BLUE SHIELD | Attending: Emergency Medicine | Admitting: Emergency Medicine

## 2020-07-12 ENCOUNTER — Encounter: Payer: Self-pay | Admitting: Emergency Medicine

## 2020-07-12 DIAGNOSIS — R569 Unspecified convulsions: Secondary | ICD-10-CM | POA: Insufficient documentation

## 2020-07-12 DIAGNOSIS — Z20822 Contact with and (suspected) exposure to covid-19: Secondary | ICD-10-CM | POA: Insufficient documentation

## 2020-07-12 LAB — BASIC METABOLIC PANEL
Anion gap: 15 (ref 5–15)
BUN: 16 mg/dL (ref 6–20)
CO2: 19 mmol/L — ABNORMAL LOW (ref 22–32)
Calcium: 9.3 mg/dL (ref 8.9–10.3)
Chloride: 102 mmol/L (ref 98–111)
Creatinine, Ser: 1 mg/dL (ref 0.61–1.24)
GFR, Estimated: >60 mL/min (ref >60)
Glucose, Bld: 128 mg/dL — ABNORMAL HIGH (ref 70–99)
Potassium: 3.7 mmol/L (ref 3.5–5.1)
Sodium: 136 mmol/L (ref 135–145)

## 2020-07-12 LAB — CBC
HCT: 41.6 % (ref 39.0–52.0)
Hemoglobin: 13.9 g/dL (ref 13.0–17.0)
MCH: 31.7 pg (ref 26.0–34.0)
MCHC: 33.4 g/dL (ref 30.0–36.0)
MCV: 94.8 fL (ref 80.0–100.0)
Platelets: 240 10*3/uL (ref 150–400)
RBC: 4.39 MIL/uL (ref 4.22–5.81)
RDW: 14.2 % (ref 11.5–15.5)
WBC: 13.7 10*3/uL — ABNORMAL HIGH (ref 4.0–10.5)
nRBC: 0 % (ref 0.0–0.2)

## 2020-07-12 MED ORDER — LEVETIRACETAM 750 MG PO TABS: 750.0000 mg | ORAL_TABLET | Freq: Two times a day (BID) | ORAL | 2 refills | Status: DC

## 2020-07-12 MED ORDER — LEVETIRACETAM 500 MG PO TABS
1000.0000 mg | ORAL_TABLET | Freq: Once | ORAL | Status: AC
  Administered 2020-07-12: 1000 mg via ORAL
  Filled 2020-07-12: qty 2

## 2020-07-12 NOTE — ED Provider Notes (Signed)
[facility] Emergency Department Provider Note  Time seen: 12:08 PM  I have reviewed the triage vital signs and the nursing notes.   HISTORY  Chief Complaint Seizures   HPI Julian Allen is a 40 y.o. male with a past medical history of Crohn's, ankylosing spondylitis, seizure disorder on Keppra presents to the emergency department for seizure this morning.  According to the patient he was making breakfast for next thing he remembers is EMS being called.  Patient states a history of seizure disorder takes Keppra 500 mg twice daily but rarely has seizures.  Patient asked has an appointment with neurologist on Friday.  Patient denies any recent illnesses fever cough shortness of breath vomiting diarrhea dysuria.  Does state mild seasonal allergies.  Patient is vaccinated against Covid and recently obtained his booster.   Past Medical History:  Diagnosis Date   Crohn's disease (HCC)    Seizures (HCC)    Spondylitis (HCC)     There are no problems to display for this patient.   Past Surgical History:  Procedure Laterality Date   SPINE SURGERY      Prior to Admission medications   Medication Sig Start Date End Date Taking? Authorizing Provider  levETIRAcetam (KEPPRA) 750 MG tablet Take 1 tablet (750 mg total) by mouth 2 (two) times daily. 07/12/20  Yes Minna Antis, MD  Adalimumab (HUMIRA PEN) 40 MG/0.8ML PNKT Take 40 mg by mouth every 14 (fourteen) days. 01/13/17   [provider]  azaTHIOprine (IMURAN) 50 MG tablet Take 150 mg by mouth daily. 08/01/17   [provider]    Allergies  Allergen Reactions   Remicade [Infliximab] Other (See Comments)    Seizures     No family history on file.  Social History Social History   Tobacco Use   Smoking status: Never Smoker   Smokeless tobacco: Never Used  Substance Use Topics   Alcohol use: Yes    Comment: Occ   Drug use: Yes    Types: Marijuana    Comment: Occassional     Review of Systems Constitutional: Negative for fever. ENT: Mild congestion Cardiovascular: Negative for chest pain. Respiratory: Negative for shortness of breath. Gastrointestinal: Negative for abdominal pain, vomiting and diarrhea. Musculoskeletal: Negative for musculoskeletal complaints Neurological: Negative for headache All other ROS negative  ____________________________________________   PHYSICAL EXAM:  VITAL SIGNS: ED Triage Vitals  Enc Vitals Group     BP 07/12/20 0905 110/72     Pulse Rate 07/12/20 0905 88     Resp 07/12/20 0905 16     Temp 07/12/20 0905 97.7 F (36.5 C)     Temp Source 07/12/20 0905 Oral     SpO2 07/12/20 0905 95 %     Weight 07/12/20 0906 155 lb (70.3 kg)     Height 07/12/20 0906 5\' 10"  (1.778 m)     Head Circumference --      Peak Flow --      Pain Score 07/12/20 0906 5     Pain Loc --      Pain Edu? --      Excl. in GC? --    Constitutional: Alert and oriented. Well appearing and in no distress. Eyes: Normal exam ENT      Head: Normocephalic and atraumatic.      Mouth/Throat: Mucous membranes are moist. Cardiovascular: Normal rate, regular rhythm. Respiratory: Normal respiratory effort without tachypnea nor retractions. Breath sounds are clear Gastrointestinal: Soft and nontender. No distention.   Musculoskeletal:  Nontender with normal range of motion in all extremities.  Neurologic:  Normal speech and language. No gross focal neurologic deficits Skin:  Skin is warm, dry and intact.  Psychiatric: Mood and affect are normal.  ____________________________________________     RADIOLOGY  CT scan negative for acute abnormality  ____________________________________________   INITIAL IMPRESSION / ASSESSMENT AND PLAN / ED COURSE  Pertinent labs & imaging results that were available during my care of the patient were reviewed by me and considered in my medical decision making (see chart for details).   Patient presents emergency  department after seizure this morning.  Overall the patient appears well, lab work is reassuring.  Patient does have mild nasal congestion he is vaccinated and boosted but would like a Covid swab.  Patient has been on Keppra 500 mg twice daily received 2000 neurogram oral loading dose in the emergency department.  Has been seizure-free since arriving to the emergency department.  We will increase the patient from 500 mg to 750 mg Keppra twice daily.  Patient has an appointment with his neurologist on Friday.  I discussed return precautions for any further seizure-like activity.  Patient agreeable plan of care.  Julian Allen was evaluated in Emergency Department on 07/12/2020 for the symptoms described in the history of present illness. He was evaluated in the context of the global COVID-19 pandemic, which necessitated consideration that the patient might be at risk for infection with the SARS-CoV-2 virus that causes COVID-19. Institutional protocols and algorithms that pertain to the evaluation of patients at risk for COVID-19 are in a state of rapid change based on information released by regulatory bodies including the CDC and federal and state organizations. These policies and algorithms were followed during the patient's care in the ED.  ____________________________________________   FINAL CLINICAL IMPRESSION(S) / ED DIAGNOSES  Seizure   Minna Antis, MD 07/12/20 1210

## 2020-07-12 NOTE — ED Triage Notes (Signed)
Pt to ED via [facility] from home for seizures. Pt states that he has hx/o same, take Keppra but ran out yesterday. Per EMS pt had 2 seizures at home this morning and hit his head. Pt was post ictal upon EMS arrival. Pt is currently A & O x 4. Pt denies any other changes. Pt takes Keppra 500 mg BID

## 2020-07-12 NOTE — ED Notes (Signed)
Pt states that he had a seizure around 0800 this morning. Pt states he has a hx of seizures and has been taking Keppra as prescribed. Pt denies HA, is A&O x 4 and ambulatory on arrival. Pt in NAD at this time. VSS.

## 2020-07-12 NOTE — Discharge Instructions (Signed)
Please begin taking your seizure medication as prescribed beginning tonight 07/12/2020.  Please follow-up with your neurologist on Friday as scheduled.  Return to the emergency department for any further seizure-like activity, or any other symptom personally concerning to yourself.

## 2020-07-13 LAB — SARS CORONAVIRUS 2 (TAT 6-24 HRS): SARS Coronavirus 2: NEGATIVE

## 2020-08-04 DIAGNOSIS — K50919 Crohn's disease, unspecified, with unspecified complications: Principal | ICD-10-CM

## 2020-08-04 MED ORDER — AZATHIOPRINE 50 MG TABLET
ORAL_TABLET | Freq: Every day | ORAL | 0 refills | 90 days | Status: CP
Start: 2020-08-04 — End: 2021-08-04

## 2020-08-04 MED ORDER — HUMIRA PEN CITRATE FREE 40 MG/0.4 ML
SUBCUTANEOUS | 3 refills | 0.00000 days | Status: CP
Start: 2020-08-04 — End: ?

## 2020-09-30 MED ORDER — AZATHIOPRINE 50 MG TABLET
ORAL_TABLET | 0 refills | 0 days | Status: CP
Start: 2020-09-30 — End: ?

## 2020-10-14 ENCOUNTER — Ambulatory Visit: Admit: 2020-10-14 | Discharge: 2020-10-14 | Payer: PRIVATE HEALTH INSURANCE

## 2020-10-14 DIAGNOSIS — M459 Ankylosing spondylitis of unspecified sites in spine: Principal | ICD-10-CM

## 2020-10-14 DIAGNOSIS — K5080 Crohn's disease of both small and large intestine without complications: Principal | ICD-10-CM

## 2020-10-14 DIAGNOSIS — Z79899 Other long term (current) drug therapy: Principal | ICD-10-CM

## 2020-10-27 MED ORDER — POLYETHYLENE GLYCOL 3350 17 GRAM/DOSE ORAL POWDER
Freq: Once | ORAL | 0 refills | 1.00000 days | Status: CP
Start: 2020-10-27 — End: 2020-10-27
  Filled 2020-11-02: qty 2, 1d supply, fill #0

## 2020-10-27 MED ORDER — SIMETHICONE 125 MG CHEWABLE TABLET
ORAL_TABLET | ORAL | 0 refills | 0.00000 days | Status: CP
Start: 2020-10-27 — End: ?
  Filled 2020-11-02: qty 4, 2d supply, fill #0

## 2020-10-27 MED ORDER — BISACODYL 5 MG TABLET,DELAYED RELEASE
ORAL_TABLET | Freq: Once | ORAL | 0 refills | 1 days | Status: CP
Start: 2020-10-27 — End: 2020-10-27

## 2020-11-02 MED ORDER — AZATHIOPRINE 50 MG TABLET
ORAL_TABLET | 0 refills | 0 days | Status: CP
Start: 2020-11-02 — End: ?

## 2020-11-02 MED FILL — GAVILAX 17 GRAM/DOSE ORAL POWDER: ORAL | 1 days supply | Qty: 238 | Fill #0

## 2020-11-02 MED FILL — MIRALAX 17 GRAM/DOSE ORAL POWDER: ORAL | 1 days supply | Qty: 119 | Fill #0

## 2020-12-31 DIAGNOSIS — K50919 Crohn's disease, unspecified, with unspecified complications: Principal | ICD-10-CM

## 2021-01-01 MED ORDER — HUMIRA PEN CITRATE FREE 40 MG/0.4 ML
SUBCUTANEOUS | 5 refills | 0.00000 days | Status: CP
Start: 2021-01-01 — End: ?

## 2021-01-04 ENCOUNTER — Ambulatory Visit: Admit: 2021-01-04 | Discharge: 2021-01-04 | Payer: PRIVATE HEALTH INSURANCE

## 2021-01-04 ENCOUNTER — Encounter
Admit: 2021-01-04 | Discharge: 2021-01-04 | Payer: PRIVATE HEALTH INSURANCE | Attending: Anesthesiology | Primary: Anesthesiology

## 2021-01-04 DIAGNOSIS — A630 Anogenital (venereal) warts: Principal | ICD-10-CM

## 2021-01-04 DIAGNOSIS — Z79899 Other long term (current) drug therapy: Principal | ICD-10-CM

## 2021-01-04 DIAGNOSIS — K5080 Crohn's disease of both small and large intestine without complications: Principal | ICD-10-CM

## 2021-01-06 DIAGNOSIS — M459 Ankylosing spondylitis of unspecified sites in spine: Principal | ICD-10-CM

## 2021-01-08 ENCOUNTER — Encounter: Admit: 2021-01-08 | Discharge: 2021-01-09 | Payer: PRIVATE HEALTH INSURANCE

## 2021-01-12 ENCOUNTER — Ambulatory Visit: Admit: 2021-01-12 | Discharge: 2021-01-13 | Payer: PRIVATE HEALTH INSURANCE

## 2021-01-12 DIAGNOSIS — A630 Anogenital (venereal) warts: Principal | ICD-10-CM

## 2021-01-21 ENCOUNTER — Encounter: Admit: 2021-01-21 | Discharge: 2021-01-21 | Payer: PRIVATE HEALTH INSURANCE

## 2021-01-21 MED ORDER — OXYCODONE 5 MG TABLET
ORAL_TABLET | ORAL | 0 refills | 2.00000 days | Status: CP | PRN
Start: 2021-01-21 — End: 2021-01-26

## 2021-01-22 ENCOUNTER — Encounter: Admit: 2021-01-22 | Discharge: 2021-01-23 | Payer: PRIVATE HEALTH INSURANCE

## 2021-02-03 MED ORDER — AZATHIOPRINE 50 MG TABLET
ORAL_TABLET | 0 refills | 0 days | Status: CP
Start: 2021-02-03 — End: ?

## 2021-02-12 DIAGNOSIS — K50819 Crohn's disease of both small and large intestine with unspecified complications: Principal | ICD-10-CM

## 2021-02-12 MED ORDER — HUMIRA PEN CITRATE FREE 40 MG/0.4 ML
SUBCUTANEOUS | 6 refills | 112 days | Status: CP
Start: 2021-02-12 — End: ?

## 2021-02-12 MED ORDER — HUMIRA PEN CITRATE FREE STARTER PACK FOR CROHN'S/UC/HS 3 X 80 MG/0.8 ML
PACK | ORAL | 0 refills | 0.00000 days | Status: CP
Start: 2021-02-12 — End: ?

## 2021-02-15 DIAGNOSIS — K50819 Crohn's disease of both small and large intestine with unspecified complications: Principal | ICD-10-CM

## 2021-02-15 MED ORDER — HUMIRA PEN CITRATE FREE 40 MG/0.4 ML
SUBCUTANEOUS | 6 refills | 28.00000 days | Status: CP
Start: 2021-02-15 — End: ?

## 2021-02-17 ENCOUNTER — Encounter: Admit: 2021-02-17 | Discharge: 2021-02-18 | Payer: PRIVATE HEALTH INSURANCE

## 2021-04-11 ENCOUNTER — Inpatient Hospital Stay (HOSPITAL_COMMUNITY): Payer: BLUE CROSS/BLUE SHIELD | Admitting: Anesthesiology

## 2021-04-11 ENCOUNTER — Inpatient Hospital Stay (HOSPITAL_COMMUNITY)
Admission: EM | Admit: 2021-04-11 | Discharge: 2021-04-27 | DRG: 459 | Disposition: A | Discharge status: Rehabilitation Facility | Payer: BLUE CROSS/BLUE SHIELD | Attending: Surgery | Admitting: Surgery

## 2021-04-11 ENCOUNTER — Emergency Department (HOSPITAL_COMMUNITY): Payer: BLUE CROSS/BLUE SHIELD

## 2021-04-11 ENCOUNTER — Inpatient Hospital Stay (HOSPITAL_COMMUNITY): Payer: BLUE CROSS/BLUE SHIELD

## 2021-04-11 ENCOUNTER — Encounter (HOSPITAL_COMMUNITY): Admission: EM | Disposition: A | Discharge status: Rehabilitation Facility | Payer: Self-pay | Source: Home / Self Care

## 2021-04-11 DIAGNOSIS — T888XXA Other specified complications of surgical and medical care, not elsewhere classified, initial encounter: Secondary | ICD-10-CM | POA: Diagnosis not present

## 2021-04-11 DIAGNOSIS — K5090 Crohn's disease, unspecified, without complications: Secondary | ICD-10-CM | POA: Diagnosis present

## 2021-04-11 DIAGNOSIS — W1789XA Other fall from one level to another, initial encounter: Secondary | ICD-10-CM | POA: Diagnosis present

## 2021-04-11 DIAGNOSIS — Z87820 Personal history of traumatic brain injury: Secondary | ICD-10-CM

## 2021-04-11 DIAGNOSIS — Z20822 Contact with and (suspected) exposure to covid-19: Secondary | ICD-10-CM | POA: Diagnosis present

## 2021-04-11 DIAGNOSIS — Z79899 Other long term (current) drug therapy: Secondary | ICD-10-CM

## 2021-04-11 DIAGNOSIS — J9690 Respiratory failure, unspecified, unspecified whether with hypoxia or hypercapnia: Secondary | ICD-10-CM

## 2021-04-11 DIAGNOSIS — Z23 Encounter for immunization: Secondary | ICD-10-CM

## 2021-04-11 DIAGNOSIS — F419 Anxiety disorder, unspecified: Secondary | ICD-10-CM | POA: Diagnosis present

## 2021-04-11 DIAGNOSIS — M4802 Spinal stenosis, cervical region: Secondary | ICD-10-CM | POA: Diagnosis present

## 2021-04-11 DIAGNOSIS — Y92239 Unspecified place in hospital as the place of occurrence of the external cause: Secondary | ICD-10-CM | POA: Diagnosis not present

## 2021-04-11 DIAGNOSIS — Z981 Arthrodesis status: Secondary | ICD-10-CM

## 2021-04-11 DIAGNOSIS — J9601 Acute respiratory failure with hypoxia: Secondary | ICD-10-CM | POA: Diagnosis not present

## 2021-04-11 DIAGNOSIS — N390 Urinary tract infection, site not specified: Secondary | ICD-10-CM | POA: Diagnosis not present

## 2021-04-11 DIAGNOSIS — Y838 Other surgical procedures as the cause of abnormal reaction of the patient, or of later complication, without mention of misadventure at the time of the procedure: Secondary | ICD-10-CM | POA: Diagnosis not present

## 2021-04-11 DIAGNOSIS — S32019A Unspecified fracture of first lumbar vertebra, initial encounter for closed fracture: Secondary | ICD-10-CM | POA: Diagnosis present

## 2021-04-11 DIAGNOSIS — S12100A Unspecified displaced fracture of second cervical vertebra, initial encounter for closed fracture: Secondary | ICD-10-CM | POA: Diagnosis present

## 2021-04-11 DIAGNOSIS — D62 Acute posthemorrhagic anemia: Secondary | ICD-10-CM | POA: Diagnosis not present

## 2021-04-11 DIAGNOSIS — R52 Pain, unspecified: Secondary | ICD-10-CM

## 2021-04-11 DIAGNOSIS — W19XXXA Unspecified fall, initial encounter: Secondary | ICD-10-CM | POA: Diagnosis present

## 2021-04-11 DIAGNOSIS — R310 Gross hematuria: Secondary | ICD-10-CM | POA: Diagnosis not present

## 2021-04-11 DIAGNOSIS — S32029A Unspecified fracture of second lumbar vertebra, initial encounter for closed fracture: Secondary | ICD-10-CM | POA: Diagnosis present

## 2021-04-11 DIAGNOSIS — K567 Ileus, unspecified: Secondary | ICD-10-CM | POA: Diagnosis not present

## 2021-04-11 DIAGNOSIS — M4804 Spinal stenosis, thoracic region: Secondary | ICD-10-CM | POA: Diagnosis present

## 2021-04-11 DIAGNOSIS — Z4659 Encounter for fitting and adjustment of other gastrointestinal appliance and device: Secondary | ICD-10-CM

## 2021-04-11 DIAGNOSIS — K9189 Other postprocedural complications and disorders of digestive system: Secondary | ICD-10-CM | POA: Diagnosis not present

## 2021-04-11 DIAGNOSIS — M459 Ankylosing spondylitis of unspecified sites in spine: Secondary | ICD-10-CM | POA: Diagnosis present

## 2021-04-11 DIAGNOSIS — I10 Essential (primary) hypertension: Secondary | ICD-10-CM | POA: Diagnosis present

## 2021-04-11 DIAGNOSIS — T794XXA Traumatic shock, initial encounter: Secondary | ICD-10-CM

## 2021-04-11 DIAGNOSIS — R1110 Vomiting, unspecified: Secondary | ICD-10-CM

## 2021-04-11 DIAGNOSIS — S12200A Unspecified displaced fracture of third cervical vertebra, initial encounter for closed fracture: Secondary | ICD-10-CM | POA: Diagnosis present

## 2021-04-11 DIAGNOSIS — T1490XA Injury, unspecified, initial encounter: Secondary | ICD-10-CM

## 2021-04-11 DIAGNOSIS — G8250 Quadriplegia, unspecified: Secondary | ICD-10-CM | POA: Diagnosis present

## 2021-04-11 DIAGNOSIS — G40909 Epilepsy, unspecified, not intractable, without status epilepticus: Secondary | ICD-10-CM | POA: Diagnosis present

## 2021-04-11 HISTORY — PX: POSTERIOR CERVICAL FUSION/FORAMINOTOMY: SHX5038

## 2021-04-11 LAB — I-STAT CHEM 8, ED
BUN: 10 mg/dL (ref 6–20)
Calcium, Ion: 1.12 mmol/L — ABNORMAL LOW (ref 1.15–1.40)
Chloride: 104 mmol/L (ref 98–111)
Creatinine, Ser: 0.6 mg/dL — ABNORMAL LOW (ref 0.61–1.24)
Glucose, Bld: 170 mg/dL — ABNORMAL HIGH (ref 70–99)
HCT: 38 % — ABNORMAL LOW (ref 39.0–52.0)
Hemoglobin: 12.9 g/dL — ABNORMAL LOW (ref 13.0–17.0)
Potassium: 3.6 mmol/L (ref 3.5–5.1)
Sodium: 138 mmol/L (ref 135–145)
TCO2: 24 mmol/L (ref 22–32)

## 2021-04-11 LAB — COMPREHENSIVE METABOLIC PANEL
ALT: 23 U/L (ref 0–44)
AST: 38 U/L (ref 15–41)
Albumin: 3.4 g/dL — ABNORMAL LOW (ref 3.5–5.0)
Alkaline Phosphatase: 64 U/L (ref 38–126)
Anion gap: 9 (ref 5–15)
BUN: 10 mg/dL (ref 6–20)
CO2: 21 mmol/L — ABNORMAL LOW (ref 22–32)
Calcium: 8.4 mg/dL — ABNORMAL LOW (ref 8.9–10.3)
Chloride: 105 mmol/L (ref 98–111)
Creatinine, Ser: 0.78 mg/dL (ref 0.61–1.24)
GFR, Estimated: >60 mL/min (ref >60)
Glucose, Bld: 172 mg/dL — ABNORMAL HIGH (ref 70–99)
Potassium: 3.6 mmol/L (ref 3.5–5.1)
Sodium: 135 mmol/L (ref 135–145)
Total Bilirubin: 0.9 mg/dL (ref 0.3–1.2)
Total Protein: 7 g/dL (ref 6.5–8.1)

## 2021-04-11 LAB — POCT I-STAT 7, (LYTES, BLD GAS, ICA,H+H)
Acid-base deficit: 1 mmol/L (ref 0.0–2.0)
Bicarbonate: 24.7 mmol/L (ref 20.0–28.0)
Calcium, Ion: 1.15 mmol/L (ref 1.15–1.40)
HCT: 32 % — ABNORMAL LOW (ref 39.0–52.0)
Hemoglobin: 10.9 g/dL — ABNORMAL LOW (ref 13.0–17.0)
O2 Saturation: 100 %
Patient temperature: 96.6
Potassium: 3.6 mmol/L (ref 3.5–5.1)
Sodium: 139 mmol/L (ref 135–145)
TCO2: 26 mmol/L (ref 22–32)
pCO2 arterial: 42.8 mmHg (ref 32.0–48.0)
pH, Arterial: 7.364 (ref 7.350–7.450)
pO2, Arterial: 342 mmHg — ABNORMAL HIGH (ref 83.0–108.0)

## 2021-04-11 LAB — SAMPLE TO BLOOD BANK

## 2021-04-11 LAB — RESP PANEL BY RT-PCR (FLU A&B, COVID) ARPGX2
Influenza A by PCR: NEGATIVE
Influenza B by PCR: NEGATIVE
SARS Coronavirus 2 by RT PCR: NEGATIVE

## 2021-04-11 LAB — CBC
HCT: 37.8 % — ABNORMAL LOW (ref 39.0–52.0)
Hemoglobin: 12.5 g/dL — ABNORMAL LOW (ref 13.0–17.0)
MCH: 32.5 pg (ref 26.0–34.0)
MCHC: 33.1 g/dL (ref 30.0–36.0)
MCV: 98.2 fL (ref 80.0–100.0)
Platelets: 230 10*3/uL (ref 150–400)
RBC: 3.85 MIL/uL — ABNORMAL LOW (ref 4.22–5.81)
RDW: 14.8 % (ref 11.5–15.5)
WBC: 16.9 10*3/uL — ABNORMAL HIGH (ref 4.0–10.5)
nRBC: 0 % (ref 0.0–0.2)

## 2021-04-11 LAB — LACTIC ACID, PLASMA: Lactic Acid, Venous: 1.6 mmol/L (ref 0.5–1.9)

## 2021-04-11 LAB — PREPARE RBC (CROSSMATCH)

## 2021-04-11 LAB — PROTIME-INR
INR: 1.1 (ref 0.8–1.2)
Prothrombin Time: 13.9 seconds (ref 11.4–15.2)

## 2021-04-11 LAB — MRSA NEXT GEN BY PCR, NASAL: MRSA by PCR Next Gen: NOT DETECTED

## 2021-04-11 LAB — ETHANOL: Alcohol, Ethyl (B): <10 mg/dL (ref <10)

## 2021-04-11 LAB — ABO/RH: ABO/RH(D): O POS

## 2021-04-11 SURGERY — POSTERIOR CERVICAL FUSION/FORAMINOTOMY LEVEL 2
Anesthesia: General | Site: Neck

## 2021-04-11 MED ORDER — THROMBIN 20000 UNITS EX SOLR
CUTANEOUS | Status: AC
  Filled 2021-04-11: qty 40000

## 2021-04-11 MED ORDER — ONDANSETRON HCL 4 MG/2ML IJ SOLN
4.0000 mg | Freq: Four times a day (QID) | INTRAMUSCULAR | Status: DC | PRN
  Administered 2021-04-12: 4 mg via INTRAVENOUS
  Filled 2021-04-11: qty 2

## 2021-04-11 MED ORDER — FENTANYL CITRATE PF 50 MCG/ML IJ SOSY
50.0000 ug | PREFILLED_SYRINGE | INTRAMUSCULAR | Status: DC | PRN
  Administered 2021-04-11 (×2): 50 ug via INTRAVENOUS
  Administered 2021-04-11: 100 ug via INTRAVENOUS
  Administered 2021-04-11 (×2): 50 ug via INTRAVENOUS
  Administered 2021-04-12: 100 ug via INTRAVENOUS
  Administered 2021-04-12: 50 ug via INTRAVENOUS
  Administered 2021-04-12: 100 ug via INTRAVENOUS
  Administered 2021-04-12 (×4): 50 ug via INTRAVENOUS
  Administered 2021-04-13: 100 ug via INTRAVENOUS
  Administered 2021-04-13: 50 ug via INTRAVENOUS
  Administered 2021-04-13 (×2): 100 ug via INTRAVENOUS
  Administered 2021-04-13 (×2): 50 ug via INTRAVENOUS
  Filled 2021-04-11 (×3): qty 1
  Filled 2021-04-11: qty 2
  Filled 2021-04-11: qty 1
  Filled 2021-04-11: qty 4
  Filled 2021-04-11 (×2): qty 1
  Filled 2021-04-11 (×2): qty 2
  Filled 2021-04-11: qty 1
  Filled 2021-04-11: qty 2
  Filled 2021-04-11: qty 1
  Filled 2021-04-11 (×2): qty 2
  Filled 2021-04-11: qty 1
  Filled 2021-04-11: qty 2

## 2021-04-11 MED ORDER — FENTANYL CITRATE (PF) 100 MCG/2ML IJ SOLN
INTRAMUSCULAR | Status: AC
  Administered 2021-04-11: 50 ug
  Filled 2021-04-11: qty 2

## 2021-04-11 MED ORDER — FENTANYL CITRATE (PF) 250 MCG/5ML IJ SOLN
INTRAMUSCULAR | Status: AC
  Filled 2021-04-11: qty 5

## 2021-04-11 MED ORDER — CEFAZOLIN SODIUM-DEXTROSE 2-4 GM/100ML-% IV SOLN
2.0000 g | Freq: Three times a day (TID) | INTRAVENOUS | Status: DC
  Administered 2021-04-11 – 2021-04-14 (×9): 2 g via INTRAVENOUS
  Filled 2021-04-11 (×11): qty 100

## 2021-04-11 MED ORDER — VANCOMYCIN HCL 1000 MG IV SOLR
INTRAVENOUS | Status: AC
  Filled 2021-04-11: qty 20

## 2021-04-11 MED ORDER — IOHEXOL 300 MG/ML  SOLN
100.0000 mL | Freq: Once | INTRAMUSCULAR | Status: AC | PRN
  Administered 2021-04-11: 100 mL via INTRAVENOUS

## 2021-04-11 MED ORDER — DEXAMETHASONE SODIUM PHOSPHATE 10 MG/ML IJ SOLN
INTRAMUSCULAR | Status: DC | PRN
  Administered 2021-04-11: 10 mg via INTRAVENOUS

## 2021-04-11 MED ORDER — MIDAZOLAM HCL 2 MG/2ML IJ SOLN
INTRAMUSCULAR | Status: DC | PRN
  Administered 2021-04-11: 2 mg via INTRAVENOUS

## 2021-04-11 MED ORDER — FENTANYL CITRATE (PF) 250 MCG/5ML IJ SOLN
INTRAMUSCULAR | Status: DC | PRN
  Administered 2021-04-11: 100 ug via INTRAVENOUS
  Administered 2021-04-11 (×3): 50 ug via INTRAVENOUS

## 2021-04-11 MED ORDER — TETANUS-DIPHTH-ACELL PERTUSSIS 5-2.5-18.5 LF-MCG/0.5 IM SUSY
0.5000 mL | PREFILLED_SYRINGE | Freq: Once | INTRAMUSCULAR | Status: AC
  Administered 2021-04-11: 0.5 mL via INTRAMUSCULAR
  Filled 2021-04-11: qty 0.5

## 2021-04-11 MED ORDER — ROCURONIUM BROMIDE 10 MG/ML (PF) SYRINGE
PREFILLED_SYRINGE | INTRAVENOUS | Status: DC | PRN
  Administered 2021-04-11: 50 mg via INTRAVENOUS
  Administered 2021-04-11: 20 mg via INTRAVENOUS
  Administered 2021-04-11: 30 mg via INTRAVENOUS
  Administered 2021-04-11: 50 mg via INTRAVENOUS
  Administered 2021-04-11: 30 mg via INTRAVENOUS

## 2021-04-11 MED ORDER — NOREPINEPHRINE 4 MG/250ML-% IV SOLN
INTRAVENOUS | Status: AC
  Administered 2021-04-11: 4 mg
  Filled 2021-04-11: qty 250

## 2021-04-11 MED ORDER — 0.9 % SODIUM CHLORIDE (POUR BTL) OPTIME
TOPICAL | Status: DC | PRN
  Administered 2021-04-11: 1000 mL

## 2021-04-11 MED ORDER — ONDANSETRON 4 MG PO TBDP: 4.0000 mg | ORAL_TABLET | Freq: Four times a day (QID) | ORAL | Status: DC | PRN

## 2021-04-11 MED ORDER — PHENYLEPHRINE HCL-NACL 20-0.9 MG/250ML-% IV SOLN
0.0000 ug/min | INTRAVENOUS | Status: DC
  Administered 2021-04-11: 20 ug/min via INTRAVENOUS
  Filled 2021-04-11: qty 250

## 2021-04-11 MED ORDER — FENTANYL CITRATE PF 50 MCG/ML IJ SOSY
50.0000 ug | PREFILLED_SYRINGE | INTRAMUSCULAR | Status: AC | PRN
  Administered 2021-04-11 (×3): 50 ug via INTRAVENOUS

## 2021-04-11 MED ORDER — BACITRACIN ZINC 500 UNIT/GM EX OINT
TOPICAL_OINTMENT | CUTANEOUS | Status: AC
  Filled 2021-04-11: qty 28.35

## 2021-04-11 MED ORDER — VANCOMYCIN HCL 1000 MG IV SOLR
INTRAVENOUS | Status: DC | PRN
  Administered 2021-04-11 (×2): 500 mg

## 2021-04-11 MED ORDER — ALBUMIN HUMAN 5 % IV SOLN: INTRAVENOUS | Status: DC | PRN

## 2021-04-11 MED ORDER — DOCUSATE SODIUM 100 MG PO CAPS: 100.0000 mg | ORAL_CAPSULE | Freq: Two times a day (BID) | ORAL | Status: DC

## 2021-04-11 MED ORDER — LEVETIRACETAM IN NACL 1000 MG/100ML IV SOLN
1000.0000 mg | Freq: Once | INTRAVENOUS | Status: AC
  Administered 2021-04-11: 1000 mg via INTRAVENOUS

## 2021-04-11 MED ORDER — BISACODYL 10 MG RE SUPP: 10.0000 mg | Freq: Every day | RECTAL | Status: DC | PRN

## 2021-04-11 MED ORDER — ONDANSETRON HCL 4 MG/2ML IJ SOLN: 4.0000 mg | Freq: Four times a day (QID) | INTRAMUSCULAR | Status: DC | PRN

## 2021-04-11 MED ORDER — PROPOFOL 10 MG/ML IV BOLUS
INTRAVENOUS | Status: DC | PRN
  Administered 2021-04-11: 30 mg via INTRAVENOUS

## 2021-04-11 MED ORDER — LACTATED RINGERS IV SOLN: INTRAVENOUS | Status: DC

## 2021-04-11 MED ORDER — BUPIVACAINE HCL (PF) 0.25 % IJ SOLN
INTRAMUSCULAR | Status: AC
  Filled 2021-04-11: qty 30

## 2021-04-11 MED ORDER — DOCUSATE SODIUM 50 MG/5ML PO LIQD
100.0000 mg | Freq: Two times a day (BID) | ORAL | Status: DC
  Administered 2021-04-14: 100 mg
  Filled 2021-04-11 (×3): qty 10

## 2021-04-11 MED ORDER — CHLORHEXIDINE GLUCONATE CLOTH 2 % EX PADS
6.0000 | MEDICATED_PAD | Freq: Every day | CUTANEOUS | Status: DC
  Administered 2021-04-11: 6 via TOPICAL

## 2021-04-11 MED ORDER — ONDANSETRON HCL 4 MG PO TABS: 4.0000 mg | ORAL_TABLET | Freq: Four times a day (QID) | ORAL | Status: DC | PRN

## 2021-04-11 MED ORDER — PHENYLEPHRINE 40 MCG/ML (10ML) SYRINGE FOR IV PUSH (FOR BLOOD PRESSURE SUPPORT)
PREFILLED_SYRINGE | INTRAVENOUS | Status: AC
  Administered 2021-04-11: 100 ug
  Filled 2021-04-11: qty 10

## 2021-04-11 MED ORDER — MORPHINE SULFATE (PF) 4 MG/ML IV SOLN
4.0000 mg | Freq: Once | INTRAVENOUS | Status: AC
  Administered 2021-04-11: 4 mg via INTRAVENOUS
  Filled 2021-04-11: qty 1

## 2021-04-11 MED ORDER — MIDAZOLAM HCL 2 MG/2ML IJ SOLN
INTRAMUSCULAR | Status: AC
  Filled 2021-04-11: qty 2

## 2021-04-11 MED ORDER — BACITRACIN ZINC 500 UNIT/GM EX OINT
TOPICAL_OINTMENT | CUTANEOUS | Status: DC | PRN
  Administered 2021-04-11: 1 via TOPICAL

## 2021-04-11 MED ORDER — LEVETIRACETAM IN NACL 500 MG/100ML IV SOLN
500.0000 mg | Freq: Two times a day (BID) | INTRAVENOUS | Status: DC
  Administered 2021-04-11 – 2021-04-17 (×12): 500 mg via INTRAVENOUS
  Filled 2021-04-11 (×12): qty 100

## 2021-04-11 MED ORDER — LACTATED RINGERS IV SOLN: INTRAVENOUS | Status: DC | PRN

## 2021-04-11 MED ORDER — NOREPINEPHRINE 4 MG/250ML-% IV SOLN: 0.0000 ug/min | INTRAVENOUS | Status: DC

## 2021-04-11 MED ORDER — SODIUM CHLORIDE 0.9 % IV SOLN
10.0000 mL/h | Freq: Once | INTRAVENOUS | Status: AC
  Administered 2021-04-13: 10 mL/h via INTRAVENOUS

## 2021-04-11 MED ORDER — SUFENTANIL CITRATE 250 MCG/5ML IV SOLN
0.2500 ug/kg/h | INTRAVENOUS | Status: DC
  Administered 2021-04-11: .2 ug/kg/h via INTRAVENOUS
  Filled 2021-04-11 (×3): qty 5

## 2021-04-11 MED ORDER — ONDANSETRON HCL 4 MG/2ML IJ SOLN
4.0000 mg | Freq: Once | INTRAMUSCULAR | Status: AC
  Administered 2021-04-11: 4 mg via INTRAVENOUS
  Filled 2021-04-11: qty 2

## 2021-04-11 MED ORDER — ONDANSETRON HCL 4 MG/2ML IJ SOLN
INTRAMUSCULAR | Status: DC | PRN
  Administered 2021-04-11: 4 mg via INTRAVENOUS

## 2021-04-11 MED ORDER — LORAZEPAM 2 MG/ML IJ SOLN
INTRAMUSCULAR | Status: AC
  Administered 2021-04-11: 0.5 mg
  Filled 2021-04-11: qty 1

## 2021-04-11 MED ORDER — POLYETHYLENE GLYCOL 3350 17 G PO PACK: 17.0000 g | PACK | Freq: Every day | ORAL | Status: DC

## 2021-04-11 MED ORDER — DEXMEDETOMIDINE HCL IN NACL 400 MCG/100ML IV SOLN
0.0000 ug/kg/h | INTRAVENOUS | Status: AC
  Administered 2021-04-11: 0.2 ug/kg/h via INTRAVENOUS
  Administered 2021-04-11: 0.4 ug/kg/h via INTRAVENOUS
  Administered 2021-04-11: 0.8 ug/kg/h via INTRAVENOUS
  Administered 2021-04-11: 0.4 ug/kg/h via INTRAVENOUS
  Administered 2021-04-12 (×3): 0.8 ug/kg/h via INTRAVENOUS
  Administered 2021-04-12 (×2): 1 ug/kg/h via INTRAVENOUS
  Administered 2021-04-13: 0.4 ug/kg/h via INTRAVENOUS
  Administered 2021-04-13: 1 ug/kg/h via INTRAVENOUS
  Administered 2021-04-14 (×2): 1.2 ug/kg/h via INTRAVENOUS
  Filled 2021-04-11 (×11): qty 100

## 2021-04-11 MED ORDER — CEFAZOLIN SODIUM-DEXTROSE 2-3 GM-%(50ML) IV SOLR
INTRAVENOUS | Status: DC | PRN
  Administered 2021-04-11: 2 g via INTRAVENOUS

## 2021-04-11 MED ORDER — CHLORHEXIDINE GLUCONATE 0.12% ORAL RINSE (MEDLINE KIT)
15.0000 mL | Freq: Two times a day (BID) | OROMUCOSAL | Status: DC
  Administered 2021-04-11 – 2021-04-23 (×24): 15 mL via OROMUCOSAL

## 2021-04-11 MED ORDER — EPINEPHRINE HCL 5 MG/250ML IV SOLN IN NS
0.5000 ug/min | INTRAVENOUS | Status: DC
  Administered 2021-04-11: 0.5 ug/min via INTRAVENOUS
  Administered 2021-04-12: 4 ug/min via INTRAVENOUS
  Administered 2021-04-12: 9 ug/min via INTRAVENOUS
  Filled 2021-04-11 (×4): qty 250

## 2021-04-11 MED ORDER — THROMBIN 20000 UNITS EX SOLR
OROMUCOSAL | Status: DC | PRN
  Administered 2021-04-11: 20 mL via TOPICAL

## 2021-04-11 MED ORDER — CEFAZOLIN SODIUM-DEXTROSE 2-4 GM/100ML-% IV SOLN: 2.0000 g | INTRAVENOUS | Status: DC

## 2021-04-11 MED ORDER — SODIUM CHLORIDE 0.9 % IV SOLN
INTRAVENOUS | Status: AC | PRN
  Administered 2021-04-11: 1000 mL via INTRAVENOUS
  Administered 2021-04-11: 150 mL/h via INTRAVENOUS

## 2021-04-11 MED ORDER — VASOPRESSIN 20 UNITS/100 ML INFUSION FOR SHOCK
0.0000 [IU]/min | INTRAVENOUS | Status: DC
  Filled 2021-04-11 (×2): qty 100

## 2021-04-11 MED ORDER — TRANEXAMIC ACID-NACL 1000-0.7 MG/100ML-% IV SOLN: 1000.0000 mg | INTRAVENOUS | Status: DC

## 2021-04-11 MED ORDER — ORAL CARE MOUTH RINSE
15.0000 mL | OROMUCOSAL | Status: DC
  Administered 2021-04-11 – 2021-04-14 (×33): 15 mL via OROMUCOSAL

## 2021-04-11 MED ORDER — PROPOFOL 10 MG/ML IV BOLUS
INTRAVENOUS | Status: AC
  Filled 2021-04-11: qty 20

## 2021-04-11 MED ORDER — MIDAZOLAM HCL 2 MG/2ML IJ SOLN
INTRAMUSCULAR | Status: AC | PRN
  Administered 2021-04-11: 2 mg via INTRAVENOUS

## 2021-04-11 SURGICAL SUPPLY — 85 items
ADH SKN CLS APL DERMABOND .7 (GAUZE/BANDAGES/DRESSINGS) ×4
APL SKNCLS STERI-STRIP NONHPOA (GAUZE/BANDAGES/DRESSINGS) ×4
BAG COUNTER SPONGE SURGICOUNT (BAG) ×6 IMPLANT
BAG DECANTER FOR FLEXI CONT (MISCELLANEOUS) ×6 IMPLANT
BAG SPNG CNTER NS LX DISP (BAG) ×4
BENZOIN TINCTURE PRP APPL 2/3 (GAUZE/BANDAGES/DRESSINGS) ×6 IMPLANT
BIT DRILL 2.4 (BIT) ×3 IMPLANT
BLADE BN FN 3.2XSTRL LF (MISCELLANEOUS) ×2 IMPLANT
BLADE BONE MILL FINE (MISCELLANEOUS) ×3
BLADE CLIPPER SURG (BLADE) IMPLANT
BUR CUTTER 7.0 ROUND (BURR) IMPLANT
BUR MATCHSTICK NEURO 3.0 LAGG (BURR) ×6 IMPLANT
CANISTER SUCT 3000ML PPV (MISCELLANEOUS) ×6 IMPLANT
CARTRIDGE OIL MAESTRO DRILL (MISCELLANEOUS) ×2 IMPLANT
CLSR STERI-STRIP ANTIMIC 1/2X4 (GAUZE/BANDAGES/DRESSINGS) ×6 IMPLANT
CNTNR URN SCR LID CUP LEK RST (MISCELLANEOUS) ×4 IMPLANT
CONT SPEC 4OZ STRL OR WHT (MISCELLANEOUS) ×6
COUNTER NEEDLE 20 DBL MAG RED (NEEDLE) ×3 IMPLANT
DECANTER SPIKE VIAL GLASS SM (MISCELLANEOUS) ×3 IMPLANT
DERMABOND ADVANCED (GAUZE/BANDAGES/DRESSINGS) ×2
DERMABOND ADVANCED .7 DNX12 (GAUZE/BANDAGES/DRESSINGS) ×4 IMPLANT
DIFFUSER DRILL AIR PNEUMATIC (MISCELLANEOUS) ×6 IMPLANT
DRAPE C-ARM 42X72 X-RAY (DRAPES) ×9 IMPLANT
DRAPE HALF SHEET 40X57 (DRAPES) IMPLANT
DRAPE INCISE IOBAN 66X45 STRL (DRAPES) ×3 IMPLANT
DRAPE LAPAROTOMY 100X72 PEDS (DRAPES) ×3 IMPLANT
DRAPE LAPAROTOMY 100X72X124 (DRAPES) IMPLANT
DRAPE SURG 17X23 STRL (DRAPES) ×12 IMPLANT
DRSG OPSITE POSTOP 4X10 (GAUZE/BANDAGES/DRESSINGS) ×3 IMPLANT
DRSG OPSITE POSTOP 4X6 (GAUZE/BANDAGES/DRESSINGS) ×3 IMPLANT
DURAPREP 26ML APPLICATOR (WOUND CARE) ×9 IMPLANT
ELECT REM PT RETURN 9FT ADLT (ELECTROSURGICAL) ×6
ELECTRODE REM PT RTRN 9FT ADLT (ELECTROSURGICAL) ×4 IMPLANT
EVACUATOR 1/8 PVC DRAIN (DRAIN) ×3 IMPLANT
GAUZE 4X4 16PLY ~~LOC~~+RFID DBL (SPONGE) ×6 IMPLANT
GAUZE SPONGE 4X4 12PLY STRL (GAUZE/BANDAGES/DRESSINGS) ×3 IMPLANT
GLOVE EXAM NITRILE XL STR (GLOVE) IMPLANT
GLOVE SURG ENC MOIS LTX SZ6.5 (GLOVE) ×9 IMPLANT
GLOVE SURG LTX SZ6.5 (GLOVE) ×3 IMPLANT
GLOVE SURG LTX SZ7 (GLOVE) ×18 IMPLANT
GLOVE SURG LTX SZ9 (GLOVE) ×18 IMPLANT
GLOVE SURG UNDER POLY LF SZ6.5 (GLOVE) ×9 IMPLANT
GOWN STRL REUS W/ TWL LRG LVL3 (GOWN DISPOSABLE) IMPLANT
GOWN STRL REUS W/ TWL XL LVL3 (GOWN DISPOSABLE) ×6 IMPLANT
GOWN STRL REUS W/TWL 2XL LVL3 (GOWN DISPOSABLE) IMPLANT
GOWN STRL REUS W/TWL LRG LVL3 (GOWN DISPOSABLE)
GOWN STRL REUS W/TWL XL LVL3 (GOWN DISPOSABLE) ×9
GRAFT BN 10X1XDBM MAGNIFUSE (Bone Implant) ×2 IMPLANT
GRAFT BONE MAGNIFUSE 1X10CM (Bone Implant) ×3 IMPLANT
KIT BASIN OR (CUSTOM PROCEDURE TRAY) ×3 IMPLANT
KIT TURNOVER KIT B (KITS) ×3 IMPLANT
MILL MEDIUM DISP (BLADE) IMPLANT
NEEDLE HYPO 22GX1.5 SAFETY (NEEDLE) ×3 IMPLANT
NEEDLE SPNL 22GX3.5 QUINCKE BK (NEEDLE) ×3 IMPLANT
NS IRRIG 1000ML POUR BTL (IV SOLUTION) ×3 IMPLANT
OIL CARTRIDGE MAESTRO DRILL (MISCELLANEOUS) ×3
PACK LAMINECTOMY NEURO (CUSTOM PROCEDURE TRAY) ×3 IMPLANT
PAD ARMBOARD 7.5X6 YLW CONV (MISCELLANEOUS) ×9 IMPLANT
PIN MAYFIELD SKULL DISP (PIN) ×3 IMPLANT
ROD PRE CUT 3.5X40MM SPINAL (Rod) ×3 IMPLANT
ROD PRECUT 3.5X50 (Rod) ×3 IMPLANT
ROD SPINAL TI CP4 NS 5.5X120 (Rod) ×6 IMPLANT
SCREW MULTI AXIAL 3.5X14MM (Screw) ×15 IMPLANT
SCREW MULTI AXIAL 3.5X16MM (Screw) ×3 IMPLANT
SCREW MULTI AXIAL 3.5X18MM (Screw) ×3 IMPLANT
SCREW SET SOLERA (Screw) ×24 IMPLANT
SCREW SET SOLERA TI5.5 (Screw) ×16 IMPLANT
SCREW SOLERA 45X6.5XMA NS SPNE (Screw) ×16 IMPLANT
SCREW SOLERA 6.5X45MM (Screw) ×24 IMPLANT
SET SCREW INFINITY IFIX THOR (Screw) ×21 IMPLANT
SPONGE SURGIFOAM ABS GEL 100 (HEMOSTASIS) ×6 IMPLANT
SPONGE SURGIFOAM ABS GEL SZ50 (HEMOSTASIS) ×3 IMPLANT
SPONGE T-LAP 4X18 ~~LOC~~+RFID (SPONGE) ×6 IMPLANT
STAPLER VISISTAT 35W (STAPLE) ×3 IMPLANT
STRIP CLOSURE SKIN 1/2X4 (GAUZE/BANDAGES/DRESSINGS) ×9 IMPLANT
SUT VIC AB 0 CT1 18XCR BRD8 (SUTURE) ×6 IMPLANT
SUT VIC AB 0 CT1 8-18 (SUTURE) ×9
SUT VIC AB 2-0 CT1 18 (SUTURE) ×9 IMPLANT
SUT VIC AB 3-0 SH 8-18 (SUTURE) ×15 IMPLANT
TAPE CLOTH 4X10 WHT NS (GAUZE/BANDAGES/DRESSINGS) ×3 IMPLANT
TOWEL GREEN STERILE (TOWEL DISPOSABLE) ×6 IMPLANT
TOWEL GREEN STERILE FF (TOWEL DISPOSABLE) ×6 IMPLANT
TRAY FOLEY MTR SLVR 16FR STAT (SET/KITS/TRAYS/PACK) IMPLANT
TUBE CONNECTING 20X1/4 (TUBING) ×3 IMPLANT
WATER STERILE IRR 1000ML POUR (IV SOLUTION) ×3 IMPLANT

## 2021-04-11 NOTE — Anesthesia Preprocedure Evaluation (Addendum)
Anesthesia Evaluation  Patient identified by MRN, date of birth, ID band Patient unresponsive    Reviewed: Allergy & Precautions, Patient's Chart, lab work & pertinent test results, Unable to perform ROS - Chart review only  Airway Mallampati: Intubated      Comment: c collar  Dental   Intubated and sedated:   Pulmonary neg pulmonary ROS,    breath sounds clear to auscultation       Cardiovascular negative cardio ROS   Rhythm:Irregular Rate:Normal     Neuro/Psych negative neurological ROS     GI/Hepatic negative GI ROS, Neg liver ROS,   Endo/Other  negative endocrine ROS  Renal/GU negative Renal ROS     Musculoskeletal negative musculoskeletal ROS (+)   Abdominal   Peds  Hematology negative hematology ROS (+)   Anesthesia Other Findings   Reproductive/Obstetrics                            Anesthesia Physical Anesthesia Plan  ASA: 2 and emergent  Anesthesia Plan: General   Post-op Pain Management:    Induction: Intravenous and Inhalational  PONV Risk Score and Plan: 3 and Ondansetron, Treatment may vary due to age or medical condition and Dexamethasone  Airway Management Planned: Oral ETT  Additional Equipment: Arterial line  Intra-op Plan:   Post-operative Plan: Post-operative intubation/ventilation  Informed Consent: I have reviewed the patients History and Physical, chart, labs and discussed the procedure including the risks, benefits and alternatives for the proposed anesthesia with the patient or authorized representative who has indicated his/her understanding and acceptance.     Dental advisory given  Plan Discussed with: CRNA  Anesthesia Plan Comments:       Anesthesia Quick Evaluation

## 2021-04-11 NOTE — Op Note (Signed)
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  Surgeon:Amariyana Heacox A.Sparsh Callens, M.D.  Asst. Surgeon: Doran Durand, NP  Anesthesia: General  Indication: 40 year old male with ankylosing spondylitis who also has a seizure disorder and a prior closed head injury presents after a fall of approximately 10 feet after an unwitnessed probable seizure.  The patient was initially noted to have some movement of his extremities although this was not well documented.  Upon returning from his trauma evaluation in the CT scanner the patient was found to be flaccid and in marked respiratory distress.  The patient was intubated for airway protection and pressors were established to improve his hypertension.  Work-up is demonstrated evidence of a chance type fracture at C2-3 with marked angulation and severe spinal canal stenosis.  I was asked to evaluate the patient at this point.  I assessed the situation.  I performed closed reduction of his cervical spine and we then got an MRI scan which demonstrated severe disruption from the Chance fracture at C1-C3 with ongoing cord compression and some early has high signal.  The patient did have some very minimal return of function and prior to surgery had some voluntary flicker movement of his left hand.  He may have had a little bit of deltoid function on the right.  He had no lower extremities function.  Complicating matters further is patient has a severe  fracture dislocation with distraction at L1-L2.  I discussed situation with the patient and his father.  I recommended we move forward with posterior cervical decompression fusion and posterior lumbar decompression and fusion in hopes of stabilizing his spine and possibly improving his situation.  The patient and his father agree to proceed.  Operative note: After induction anesthesia, the patient was carefully positioned prone.  His head was fixed in [place] pin headrest.  Given the patient's severe kyphotic deformity of his thoracic spine positioning was difficult.  C-arm was then used to perform further closed reduction and alignment of the cervical spine.  It was difficult to visualize the L1-2 fracture given the way had to be positioned.  Patient's posterior cervical region prepped and draped sterilely.  Incision was made overlying C1-C5.  Dissection performed bilaterally.  Retractor placed.  Fluoroscopy used.  Levels confirmed.  Decompressive laminectomies then performed to remove the inferior aspect of the lamina of C2 the entire C3 and C4 lamina.  Ligament flavum elevated and resected.  Decompression was carried out widely to the lateral edge of the dura bilaterally.  Attention then placed to spinal fixation.  Entry points into C2-C3 and C4 were identified bilaterally.  Lateral mass screws were placed at C3 and C4 by finding the midpoint of the lateral mass starting 1 mm inferior and medial and angling the screw trajectory approximately 20 degrees cephalad and lateral.  Good purchase of bone was achieved and 14 mm [employer] lateral mass screws were placed.  On the left side at C2 placed a pars interarticularis screw into the body of C2 with good purchase.  On the right at C2 it was difficult to place a pars screw and I placed  essentially a lateral mass screw with good purchase.  Wound was irrigated.  The facet joints were decorticated.  Morselized autograft which had been safe and the decompression was  packed posterior laterally.  Short segment of titanium rods and placed of the screws.  Locking caps then placed over the screws and locking caps and engaged with a construct under compression.  Final images reveal good position of the hardware at the proper operative level.  Wound is then irrigated 1 final time.  Is then closed in layers of Vicryl sutures.  Steri-Strips and sterile dressing were applied.    Attention then placed to the L1-L2 level.  This region had been previously prepped out and draped.  Incision was made from T11-L3 4.  Dissection performed bilaterally.  Retractor placed.  Fluoroscopy used.  Levels confirmed.  Given the patient's deformity and need for being positioned with his arms at the side visualizing the fracture was difficult but eventually this was possible.  Decompressive laminectomy then performed using [facility] a high-speed drill to remove a small aspect of the lamina of L1 and L2.  This facilitated reduction and partial reducing of his deformity.  I did not perform a more aggressive laminectomy with the fracture reduction I think the canal was patent and I thought there is a very high likelihood of CSF laceration from his fracture and I did not want to complicate wound healing by uncovering the CSF leak.  Pedicles of T12 L1-L2 and L3 were identified using surface landmarks and intraoperative fluoroscopy of superficial bone around the pedicle was then removed high-speed drill patient pedicles then probed using pedicle all each pedicle tract was then probed and found to be solid within bone.  Pedicle tract was tapped with a screw tap.  Screw to the hole was probed and found to be solidly within bone.  Short segment of titanium rods and placed over the screw heads from T12-L3.  Locking caps posterior the screws and locking caps then engaged with a construct In neutral position.  Wound is irrigated 1 final time.  Lamina and transverse processes were decorticated.   Morselized autograft and morselized allograft was packed.  Wound was then closed in layers with Vicryl sutures.  Steri-Strips and sterile dressing were applied.  No apparent complications.  Patient tolerated the procedure well and he returns to the recovery room postop.

## 2021-04-11 NOTE — Transfer of Care (Signed)
Immediate Anesthesia Transfer of Care Note  Patient: Julian Allen  Procedure(s) Performed: Cervical Two - Cervical Four Laminectomy and Posterior Fusion With Instrumentation (Neck) Lumbar One, Lumbar Two Decompression, Thoracic Twelve - Lumbar Three Posterior Fusion with Instrumentation (Back)  Patient Location: NICU  Anesthesia Type:General  Level of Consciousness: Patient remains intubated per anesthesia plan  Airway & Oxygen Therapy: Patient remains intubated per anesthesia plan  Post-op Assessment: Report given to RN and Post -op Vital signs reviewed and stable  Post vital signs: Reviewed and stable  Last Vitals:  Vitals Value Taken Time  BP 101/73 04/11/21 1547  Temp    Pulse 47 04/11/21 1554  Resp 18 04/11/21 1554  SpO2 100 % 04/11/21 1554  Vitals shown include unvalidated device data.  Last Pain:  Vitals:   04/11/21 1006  TempSrc:   PainSc: 0-No pain         Complications: No notable events documented.

## 2021-04-11 NOTE — ED Triage Notes (Signed)
Pt arrives via EMS from home after falling through the ceiling and landing on exercise equipment. Pt endorses back pain, worse lumbar. 100 mcg fentanyl given en route.  Hx of seizures where he where walk around the house.

## 2021-04-11 NOTE — ED Notes (Signed)
Pt sats dropped to 80% on NRB. EDP aware and at bedside. Orders for ativan for possible seizure

## 2021-04-11 NOTE — Consult Note (Addendum)
Reason for Consult: Spinal cord injury Referring Physician: Emergency department  Julian Allen is an 40 y.o. male.  HPI: 40 year old male with past history of Crohn's disease and ankylosing spondylitis.  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  Patient reportedly fell through a ceiling and dropped to the ground approximately 72feet.  Cause of fall is unknown but it is assumed the patient had a seizure prior to the fall.  EMS transferred the patient to the emergency department for further evaluation.  By report (unwitnessed by me) the patient was moving his extremities upon arrival.  No assessment regarding the quality or strength of these movements was made in the initial evaluations.  The patient was found to be struggling to breathe shortly thereafter and it was noted that time the patient's extremities were flaccid.  This occurred sometime after his CT scans had been performed.  Patient was intubated for airway control and is required pressors for blood pressure support.  He is currently awake and appears aware.  He follows commands with his face and tongue.  He has no voluntary movement of his Upper or lower extremities.  Work-up demonstrates cervical spine autofusion secondary to ankylosing spondylitis, prior C2 fixation with an odontoid screw and a new severe Chance fracture with dislocation and severe canal compromise.  Patient also with thoracic and lumbar autofusion with L1-L2 Chance fracture with fracture dislocation and distraction.    No past medical history on file.    No family history on file.  Social History:  has no history on file for tobacco use, alcohol use, and drug use.  Allergies: No Known Allergies  Medications: I have reviewed the patient's current medications.  Results for orders placed or performed during the hospital encounter of 04/11/21 (from the past 48 hour(s))  Type and screen [facility]      Status: None   Collection Time: 04/11/21  3:52 AM  Result Value Ref Range   ABO/RH(D) O POS    Antibody Screen NEG    Sample Expiration      04/14/2021,2359 Performed at [facility], [address] [place], [place] [address]   Comprehensive metabolic panel     Status: Abnormal   Collection Time: 04/11/21  3:54 AM  Result Value Ref Range   Sodium 135 135 - 145 mmol/L   Potassium 3.6 3.5 - 5.1 mmol/L   Chloride 105 98 - 111 mmol/L   CO2 21 (L) 22 - 32 mmol/L   Glucose, Bld 172 (H) 70 - 99 mg/dL    Comment: Glucose reference range applies only to samples taken after fasting for at least 8 hours.   BUN 10 6 - 20 mg/dL   Creatinine, Ser 2.53 0.61 - 1.24 mg/dL   Calcium 8.4 (L) 8.9 - 10.3 mg/dL   Total Protein 7.0 6.5 - 8.1 g/dL   Albumin 3.4 (L) 3.5 - 5.0 g/dL   AST 38 15 - 41 U/L   ALT 23 0 - 44 U/L   Alkaline Phosphatase 64 38 - 126 U/L   Total Bilirubin 0.9 0.3 - 1.2 mg/dL   GFR, Estimated >66 >44 mL/min    Comment: (NOTE) Calculated using the CKD-EPI Creatinine Equation (2021)    Anion gap 9 5 - 15    Comment: Performed at [facility], [address] [place], [place] [address]  CBC     Status: Abnormal   Collection Time: 04/11/21  3:54  AM  Result Value Ref Range   WBC 16.9 (H) 4.0 - 10.5 K/uL   RBC 3.85 (L) 4.22 - 5.81 MIL/uL   Hemoglobin 12.5 (L) 13.0 - 17.0 g/dL   HCT 44.0 (L) 34.7 - 42.5 %   MCV 98.2 80.0 - 100.0 fL   MCH 32.5 26.0 - 34.0 pg   MCHC 33.1 30.0 - 36.0 g/dL   RDW 95.6 38.7 - 56.4 %   Platelets 230 150 - 400 K/uL   nRBC 0.0 0.0 - 0.2 %    Comment: Performed at [facility], [address], [place], [place] [address]  Ethanol     Status: None   Collection Time: 04/11/21  3:54 AM  Result Value Ref Range   Alcohol, Ethyl (B) <10 <10 mg/dL    Comment: (NOTE) Lowest detectable limit for serum alcohol is 10 mg/dL.  For medical purposes only. Performed at [facility], [address] [place], [place] [address]   Lactic  acid, plasma     Status: None   Collection Time: 04/11/21  3:54 AM  Result Value Ref Range   Lactic Acid, Venous 1.6 0.5 - 1.9 mmol/L    Comment: Performed at [facility], [address] [place], [place] [address]  Protime-INR     Status: None   Collection Time: 04/11/21  3:54 AM  Result Value Ref Range   Prothrombin Time 13.9 11.4 - 15.2 seconds   INR 1.1 0.8 - 1.2    Comment: (NOTE) INR goal varies based on device and disease states. Performed at [facility], [address], [place], [place] [address]   Sample to Blood Bank     Status: None   Collection Time: 04/11/21  3:58 AM  Result Value Ref Range   Blood Bank Specimen SAMPLE AVAILABLE FOR TESTING    Sample Expiration      04/14/2021,2359 Performed at [facility], [address] [place], [place] [address]   Resp Panel by RT-PCR (Flu A&B, Covid) Nasopharyngeal Swab     Status: None   Collection Time: 04/11/21  4:00 AM   Specimen: Nasopharyngeal Swab; Nasopharyngeal(NP) swabs in vial transport medium  Result Value Ref Range   SARS Coronavirus 2 by RT PCR NEGATIVE NEGATIVE    Comment: (NOTE) SARS-CoV-2 target nucleic acids are NOT DETECTED.  The SARS-CoV-2 RNA is generally detectable in upper respiratory specimens during the acute phase of infection. The lowest concentration of SARS-CoV-2 viral copies this assay can detect is 138 copies/mL. A negative result does not preclude SARS-Cov-2 infection and should not be used as the sole basis for treatment or other patient management decisions. A negative result may occur with  improper specimen collection/handling, submission of specimen other than nasopharyngeal swab, presence of viral mutation(s) within the areas targeted by this assay, and inadequate number of viral copies(<138 copies/mL). A negative result must be combined with clinical observations, patient history, and epidemiological information. The expected result is Negative.  Fact Sheet for  Patients:  BloggerCourse.com  Fact Sheet for Healthcare Providers:  SeriousBroker.it  This test is no t yet approved or cleared by the [place] FDA and  has been authorized for detection and/or diagnosis of SARS-CoV-2 by FDA under an Emergency Use Authorization (EUA). This EUA will remain  in effect (meaning this test can be used) for the duration of the COVID-19 declaration under Section 564(b)(1) of the Act, 21 U.S.C.section 360bbb-3(b)(1), unless the authorization is terminated  or  revoked sooner.       Influenza A by PCR NEGATIVE NEGATIVE   Influenza B by PCR NEGATIVE NEGATIVE    Comment: (NOTE) The Xpert Xpress SARS-CoV-2/FLU/RSV plus assay is intended as an aid in the diagnosis of influenza from Nasopharyngeal swab specimens and should not be used as a sole basis for treatment. Nasal washings and aspirates are unacceptable for Xpert Xpress SARS-CoV-2/FLU/RSV testing.  Fact Sheet for Patients: BloggerCourse.com  Fact Sheet for Healthcare Providers: SeriousBroker.it  This test is not yet approved or cleared by the [place] FDA and has been authorized for detection and/or diagnosis of SARS-CoV-2 by FDA under an Emergency Use Authorization (EUA). This EUA will remain in effect (meaning this test can be used) for the duration of the COVID-19 declaration under Section 564(b)(1) of the Act, 21 U.S.C. section 360bbb-3(b)(1), unless the authorization is terminated or revoked.  Performed at [facility], [address] [place], [place] [address]   I-Stat Chem 8, ED     Status: Abnormal   Collection Time: 04/11/21  4:07 AM  Result Value Ref Range   Sodium 138 135 - 145 mmol/L   Potassium 3.6 3.5 - 5.1 mmol/L   Chloride 104 98 - 111 mmol/L   BUN 10 6 - 20 mg/dL   Creatinine, Ser 9.83 (L) 0.61 - 1.24 mg/dL   Glucose, Bld 382 (H) 70 - 99 mg/dL    Comment:  Glucose reference range applies only to samples taken after fasting for at least 8 hours.   Calcium, Ion 1.12 (L) 1.15 - 1.40 mmol/L   TCO2 24 22 - 32 mmol/L   Hemoglobin 12.9 (L) 13.0 - 17.0 g/dL   HCT 50.5 (L) 39.7 - 67.3 %    CT HEAD WO CONTRAST  Result Date: 04/11/2021 CLINICAL DATA:  Critical poly trauma.  Fall through ceiling EXAM: CT HEAD WITHOUT CONTRAST CT CERVICAL SPINE WITHOUT CONTRAST TECHNIQUE: Multidetector CT imaging of the head and cervical spine was performed following the standard protocol without intravenous contrast. Multiplanar CT image reconstructions of the cervical spine were also generated. COMPARISON:  None. FINDINGS: CT HEAD FINDINGS Brain: No evidence of acute infarction, hemorrhage, hydrocephalus, extra-axial collection or mass lesion/mass effect. Vascular: No hyperdense vessel or unexpected calcification. Skull: Negative for fracture Sinuses/Orbits: No visible injury Other: Generalized motion artifact CT CERVICAL SPINE FINDINGS Alignment: Traumatic widening and retrolisthesis at C2-3. Retrolisthesis measures 5 mm. Skull base and vertebrae: Rigid spine consistent with ankylosing spondylitis with fracture at the C2-3 level involving the disc space, left ring of C1, right lateral mass C3, in the right superior and anterior corner of C3. Prior dens fracture repair with screw. Ankylosis continues from C1 into the thoracic spine. Soft tissues and spinal canal: Expected posterior vertebral hemorrhage with anterior displacement of the airway. There is also swelling to the anterior neck, right eccentric at the level of the strap muscles. No cartilage fracture is noted. Disc levels:  Ankylosis. Upper chest: Reported separately Critical Value/emergent results were called by telephone at the time of interpretation on 04/11/2021 at 4:38 am to provider [facility] , who verbally acknowledged these results. IMPRESSION: 1. Unstable rigid spine fracture at C2-3 with fracture distraction and  retrolisthesis causing canal stenosis. 2. Airway displacement by expected prevertebral hematoma. There is also anterior neck soft contusion, right eccentric, at the level of the strap muscles. 3. No evidence of intracranial injury. Electronically Signed   By: Tiburcio Pea M.D.   On: 04/11/2021 04:43   CT CHEST  W CONTRAST  Addendum Date: 04/11/2021   ADDENDUM REPORT: 04/11/2021 05:20 ADDENDUM: Omitted listing of transverse process fractures which involve L2 and L3 bilaterally, and L4 on the left. Electronically Signed   By: Tiburcio Pea M.D.   On: 04/11/2021 05:20   Result Date: 04/11/2021 CLINICAL DATA:  Critical poly trauma. EXAM: CT CHEST, ABDOMEN, AND PELVIS WITH CONTRAST TECHNIQUE: Multidetector CT imaging of the chest, abdomen and pelvis was performed following the standard protocol during bolus administration of intravenous contrast. CONTRAST:  OMNIPAQUE IOHEXOL 300 MG/ML  SOLN COMPARISON:  None. FINDINGS: CT CHEST FINDINGS Cardiovascular: Normal heart size. No pericardial effusion. No evidence of great vessel injury Mediastinum/Nodes: No hematoma or pneumomediastinum Lungs/Pleura: Minimal ground-glass density in the dependent right lung attributed atelectasis. Secretions in the right bronchus intermedius. No hemothorax or pneumothorax. Musculoskeletal: Diffuse posterior element ankylosis. Superimposed bulky spurring at the open T5-6 level, with severe foraminal impingement and cord impingement from behind. Similar finding seen at T11-12. CT ABDOMEN PELVIS FINDINGS Hepatobiliary: No hepatic injury or perihepatic hematoma. Gallbladder is unremarkable. Pancreas: Negative Spleen: No splenic injury or perisplenic hematoma. Adrenals/Urinary Tract: Slight thickening at the left adrenal gland but not convincing for hemorrhage. No evidence of renal injury. Small bilateral renal calculi and cystic densities. Stomach/Bowel: No evidence of injury. Vascular/Lymphatic: Fat stranding in the retroperitoneum  about the aorta in the hepatic cava at the level of the hiatus to the IMA takeoff, attributed to the spine fracture. No active hemorrhage or discrete vessel injury seen. Reproductive: Negative Other: No ascites or pneumoperitoneum Musculoskeletal: L1-2 fracture dislocation traversing the disc space, L1 inferior endplate, and fused posterior elements. There is anterolisthesis and fracture widening with severe spinal stenosis and probable canal hemorrhage. Definite thecal sac involvement. Hypertrophic posterior elements especially at L3-4 with spinal stenosis. Superior endplate fractures with sclerosis at T10 and L1, nonacute. Rigid spine, sacroiliac ankylosis, and right hip osteoarthritis. IMPRESSION: 1. Rigid spine fracture with marked displacement and spinal stenosis at L1-2. 2. Retroperitoneal hemorrhage due to #1. 3. No evidence of visceral injury. 4. Incidental findings noted above. Electronically Signed: By: Tiburcio Pea M.D. On: 04/11/2021 04:52   CT CERVICAL SPINE WO CONTRAST  Result Date: 04/11/2021 CLINICAL DATA:  Critical poly trauma.  Fall through ceiling EXAM: CT HEAD WITHOUT CONTRAST CT CERVICAL SPINE WITHOUT CONTRAST TECHNIQUE: Multidetector CT imaging of the head and cervical spine was performed following the standard protocol without intravenous contrast. Multiplanar CT image reconstructions of the cervical spine were also generated. COMPARISON:  None. FINDINGS: CT HEAD FINDINGS Brain: No evidence of acute infarction, hemorrhage, hydrocephalus, extra-axial collection or mass lesion/mass effect. Vascular: No hyperdense vessel or unexpected calcification. Skull: Negative for fracture Sinuses/Orbits: No visible injury Other: Generalized motion artifact CT CERVICAL SPINE FINDINGS Alignment: Traumatic widening and retrolisthesis at C2-3. Retrolisthesis measures 5 mm. Skull base and vertebrae: Rigid spine consistent with ankylosing spondylitis with fracture at the C2-3 level involving the disc  space, left ring of C1, right lateral mass C3, in the right superior and anterior corner of C3. Prior dens fracture repair with screw. Ankylosis continues from C1 into the thoracic spine. Soft tissues and spinal canal: Expected posterior vertebral hemorrhage with anterior displacement of the airway. There is also swelling to the anterior neck, right eccentric at the level of the strap muscles. No cartilage fracture is noted. Disc levels:  Ankylosis. Upper chest: Reported separately Critical Value/emergent results were called by telephone at the time of interpretation on 04/11/2021 at 4:38 am to provider COURTNEY HORTON ,  who verbally acknowledged these results. IMPRESSION: 1. Unstable rigid spine fracture at C2-3 with fracture distraction and retrolisthesis causing canal stenosis. 2. Airway displacement by expected prevertebral hematoma. There is also anterior neck soft contusion, right eccentric, at the level of the strap muscles. 3. No evidence of intracranial injury. Electronically Signed   By: Tiburcio Pea M.D.   On: 04/11/2021 04:43   CT ABDOMEN PELVIS W CONTRAST  Addendum Date: 04/11/2021   ADDENDUM REPORT: 04/11/2021 05:20 ADDENDUM: Omitted listing of transverse process fractures which involve L2 and L3 bilaterally, and L4 on the left. Electronically Signed   By: Tiburcio Pea M.D.   On: 04/11/2021 05:20   Result Date: 04/11/2021 CLINICAL DATA:  Critical poly trauma. EXAM: CT CHEST, ABDOMEN, AND PELVIS WITH CONTRAST TECHNIQUE: Multidetector CT imaging of the chest, abdomen and pelvis was performed following the standard protocol during bolus administration of intravenous contrast. CONTRAST:  OMNIPAQUE IOHEXOL 300 MG/ML  SOLN COMPARISON:  None. FINDINGS: CT CHEST FINDINGS Cardiovascular: Normal heart size. No pericardial effusion. No evidence of great vessel injury Mediastinum/Nodes: No hematoma or pneumomediastinum Lungs/Pleura: Minimal ground-glass density in the dependent right lung  attributed atelectasis. Secretions in the right bronchus intermedius. No hemothorax or pneumothorax. Musculoskeletal: Diffuse posterior element ankylosis. Superimposed bulky spurring at the open T5-6 level, with severe foraminal impingement and cord impingement from behind. Similar finding seen at T11-12. CT ABDOMEN PELVIS FINDINGS Hepatobiliary: No hepatic injury or perihepatic hematoma. Gallbladder is unremarkable. Pancreas: Negative Spleen: No splenic injury or perisplenic hematoma. Adrenals/Urinary Tract: Slight thickening at the left adrenal gland but not convincing for hemorrhage. No evidence of renal injury. Small bilateral renal calculi and cystic densities. Stomach/Bowel: No evidence of injury. Vascular/Lymphatic: Fat stranding in the retroperitoneum about the aorta in the hepatic cava at the level of the hiatus to the IMA takeoff, attributed to the spine fracture. No active hemorrhage or discrete vessel injury seen. Reproductive: Negative Other: No ascites or pneumoperitoneum Musculoskeletal: L1-2 fracture dislocation traversing the disc space, L1 inferior endplate, and fused posterior elements. There is anterolisthesis and fracture widening with severe spinal stenosis and probable canal hemorrhage. Definite thecal sac involvement. Hypertrophic posterior elements especially at L3-4 with spinal stenosis. Superior endplate fractures with sclerosis at T10 and L1, nonacute. Rigid spine, sacroiliac ankylosis, and right hip osteoarthritis. IMPRESSION: 1. Rigid spine fracture with marked displacement and spinal stenosis at L1-2. 2. Retroperitoneal hemorrhage due to #1. 3. No evidence of visceral injury. 4. Incidental findings noted above. Electronically Signed: By: Tiburcio Pea M.D. On: 04/11/2021 04:52   DG Pelvis Portable  Result Date: 04/11/2021 CLINICAL DATA:  Critical poly trauma. EXAM: PORTABLE PELVIS 1 VIEWS COMPARISON:  None. FINDINGS: Sacroiliac ankylosis with spurring. No acute fracture or  subluxation involving the pelvis. Degenerative spurring at the right hip. IMPRESSION: 1. No acute finding in the pelvis. 2. Bilateral sacroiliac ankylosis. Electronically Signed   By: Tiburcio Pea M.D.   On: 04/11/2021 04:25   DG Chest Portable 1 View  Result Date: 04/11/2021 CLINICAL DATA:  Central line placement. EXAM: PORTABLE CHEST 1 VIEW COMPARISON:  April 11, 2021 FINDINGS: A new right central line terminates in the central SVC. No pneumothorax. The ETT is in good position. The NG tube terminates below today's film. The cardiomediastinal silhouette is normal. No pulmonary nodules or masses. No focal infiltrates. The lateral aspect of the left lung base was not included on this study. IMPRESSION: 1. The new right central line terminates in the central SVC. No pneumothorax. 2. Other support  apparatus as above. 3. Left midclavicular fracture. 4. Deformity of the right humeral head has a nonacute appearance. Electronically Signed   By: Gerome Sam III M.D.   On: 04/11/2021 05:44   DG Chest Portable 1 View  Result Date: 04/11/2021 CLINICAL DATA:  Intubation EXAM: PORTABLE CHEST 1 VIEW COMPARISON:  Earlier the same day FINDINGS: New endotracheal tube with tip just below the clavicular heads. The enteric tube reaches the stomach. The lungs remain clear. Normal heart size and mediastinal contours. Left mid clavicle fracture. IMPRESSION: 1. Unremarkable hardware. 2. Left mid clavicle fracture. Electronically Signed   By: Tiburcio Pea M.D.   On: 04/11/2021 05:23   DG Chest Port 1 View  Result Date: 04/11/2021 CLINICAL DATA:  Level 2 trauma due to fall. EXAM: PORTABLE CHEST 1 VIEW COMPARISON:  None. FINDINGS: Low volume rotated chest. There is no edema, consolidation, effusion, or pneumothorax. No detected fracture IMPRESSION: Negative rotated chest radiograph. Electronically Signed   By: Tiburcio Pea M.D.   On: 04/11/2021 04:23    Review of systems not obtained due to patient factors. Blood  pressure 106/63, pulse 87, temperature (!) 96.6 F (35.9 C), temperature source Temporal, resp. rate (!) 8, height 5\' 8"  (1.727 m), weight 75.3 kg, SpO2 100 %. Patient is awake and aware.  He is intubated.  He follows commands with his face and tongue.  Pupils are equal round reactive to light at 4 mm.  Face movement is symmetric with intact sensation.  Tongue protrudes the midline.  Patient has no movement of his upper or lower extremities.  He has no sensation below C3.  Reflexes are absent.  Rectal tone is absent.  Examination head ears eyes nose and throat demonstrates some mild surface abrasions but no evidence of significant lacerations or bony abnormality.  Oropharynx, nasopharynx and external auditory canals are clear.  Chest and abdomen are thin but otherwise benign.  Assessment/Plan: C2-3 Chance fracture with severe stenosis and what appears to be complete spinal cord injury.  Plan for emergent MRI scan to better assess this.  Not sure what to make of the patient's neurologic decline whether this may represent an expanding hematoma versus positional injury due to the instability of his fractures.  Nevertheless at this point I remain doubtful that much neurologic improvement can be expected. A Delayne Sanzo 04/11/2021, 6:06 AM

## 2021-04-11 NOTE — ED Notes (Signed)
Pt upgraded to level 1

## 2021-04-11 NOTE — H&P (Signed)
Activation and Reason: level 2-->level 1  Primary Survey:  Airway: not talking on my arrival, moving air with bag mask but sats in 70s Breathing: bilateral breath sounds Circulation: palpable pulses in all 4 ext Disability: GCS 10  HPI: Julian Allen is an 40 y.o. male whom arrived as level 2 activation s/p fall through ceiling at home and landing on exercise equipment at approximately. Fell approx 10 ft.  He was amnestic to the events. Arrived complaining of back pain. Underwent workup in ER and then was upgraded to level 1 when he was not breathing or moving. Did not loose pulses. Reportedly flaccid paralysis, bag mask initiated and intubated.  PMH: Per chart review - seizure disorder and Crohn's disease and Ankylosing spondylosis  Soc: unknown  Fhx: Unknown  Social:  has no history on file for tobacco use, alcohol use, and drug use.  Allergies: No Known Allergies  Medications: I have reviewed the patient's current medications.  Results for orders placed or performed during the hospital encounter of 04/11/21 (from the past 48 hour(s))  Type and screen [facility]     Status: None (Preliminary result)   Collection Time: 04/11/21  3:52 AM  Result Value Ref Range   ABO/RH(D) PENDING    Antibody Screen PENDING    Sample Expiration      04/14/2021,2359 Performed at [facility], [address], [place], [place] [address]   Comprehensive metabolic panel     Status: Abnormal   Collection Time: 04/11/21  3:54 AM  Result Value Ref Range   Sodium 135 135 - 145 mmol/L   Potassium 3.6 3.5 - 5.1 mmol/L   Chloride 105 98 - 111 mmol/L   CO2 21 (L) 22 - 32 mmol/L   Glucose, Bld 172 (H) 70 - 99 mg/dL    Comment: Glucose reference range applies only to samples taken after fasting for at least 8 hours.   BUN 10 6 - 20 mg/dL   Creatinine, Ser 1.44 0.61 - 1.24 mg/dL   Calcium 8.4 (L) 8.9 - 10.3 mg/dL   Total Protein 7.0 6.5 - 8.1 g/dL   Albumin 3.4 (L) 3.5 - 5.0  g/dL   AST 38 15 - 41 U/L   ALT 23 0 - 44 U/L   Alkaline Phosphatase 64 38 - 126 U/L   Total Bilirubin 0.9 0.3 - 1.2 mg/dL   GFR, Estimated >31 >54 mL/min    Comment: (NOTE) Calculated using the CKD-EPI Creatinine Equation (2021)    Anion gap 9 5 - 15    Comment: Performed at [facility], [address] [place], [place] [address]  CBC     Status: Abnormal   Collection Time: 04/11/21  3:54 AM  Result Value Ref Range   WBC 16.9 (H) 4.0 - 10.5 K/uL   RBC 3.85 (L) 4.22 - 5.81 MIL/uL   Hemoglobin 12.5 (L) 13.0 - 17.0 g/dL   HCT 61.9 (L) 50.9 - 32.6 %   MCV 98.2 80.0 - 100.0 fL   MCH 32.5 26.0 - 34.0 pg   MCHC 33.1 30.0 - 36.0 g/dL   RDW 71.2 45.8 - 09.9 %   Platelets 230 150 - 400 K/uL   nRBC 0.0 0.0 - 0.2 %    Comment: Performed at [facility], [address] [place], [place] [address]  Ethanol     Status: None   Collection Time: 04/11/21  3:54 AM  Result Value Ref Range  Alcohol, Ethyl (B) <10 <10 mg/dL    Comment: (NOTE) Lowest detectable limit for serum alcohol is 10 mg/dL.  For medical purposes only. Performed at [facility], [address] [place], [place] [address]   Protime-INR     Status: None   Collection Time: 04/11/21  3:54 AM  Result Value Ref Range   Prothrombin Time 13.9 11.4 - 15.2 seconds   INR 1.1 0.8 - 1.2    Comment: (NOTE) INR goal varies based on device and disease states. Performed at [facility], [address] [place], [place] [address]   Sample to Blood Bank     Status: None   Collection Time: 04/11/21  3:58 AM  Result Value Ref Range   Blood Bank Specimen SAMPLE AVAILABLE FOR TESTING    Sample Expiration      04/14/2021,2359 Performed at [facility], [address], [place], [place] [address]   I-Stat Chem 8, ED     Status: Abnormal   Collection Time: 04/11/21  4:07 AM  Result Value Ref Range   Sodium 138 135 - 145 mmol/L   Potassium 3.6 3.5 - 5.1 mmol/L   Chloride 104 98 - 111 mmol/L   BUN 10 6  - 20 mg/dL   Creatinine, Ser 9.14 (L) 0.61 - 1.24 mg/dL   Glucose, Bld 782 (H) 70 - 99 mg/dL    Comment: Glucose reference range applies only to samples taken after fasting for at least 8 hours.   Calcium, Ion 1.12 (L) 1.15 - 1.40 mmol/L   TCO2 24 22 - 32 mmol/L   Hemoglobin 12.9 (L) 13.0 - 17.0 g/dL   HCT 95.6 (L) 21.3 - 08.6 %    CT HEAD WO CONTRAST  Result Date: 04/11/2021 CLINICAL DATA:  Critical poly trauma.  Fall through ceiling EXAM: CT HEAD WITHOUT CONTRAST CT CERVICAL SPINE WITHOUT CONTRAST TECHNIQUE: Multidetector CT imaging of the head and cervical spine was performed following the standard protocol without intravenous contrast. Multiplanar CT image reconstructions of the cervical spine were also generated. COMPARISON:  None. FINDINGS: CT HEAD FINDINGS Brain: No evidence of acute infarction, hemorrhage, hydrocephalus, extra-axial collection or mass lesion/mass effect. Vascular: No hyperdense vessel or unexpected calcification. Skull: Negative for fracture Sinuses/Orbits: No visible injury Other: Generalized motion artifact CT CERVICAL SPINE FINDINGS Alignment: Traumatic widening and retrolisthesis at C2-3. Retrolisthesis measures 5 mm. Skull base and vertebrae: Rigid spine consistent with ankylosing spondylitis with fracture at the C2-3 level involving the disc space, left ring of C1, right lateral mass C3, in the right superior and anterior corner of C3. Prior dens fracture repair with screw. Ankylosis continues from C1 into the thoracic spine. Soft tissues and spinal canal: Expected posterior vertebral hemorrhage with anterior displacement of the airway. There is also swelling to the anterior neck, right eccentric at the level of the strap muscles. No cartilage fracture is noted. Disc levels:  Ankylosis. Upper chest: Reported separately Critical Value/emergent results were called by telephone at the time of interpretation on 04/11/2021 at 4:38 am to provider [facility] , who verbally  acknowledged these results. IMPRESSION: 1. Unstable rigid spine fracture at C2-3 with fracture distraction and retrolisthesis causing canal stenosis. 2. Airway displacement by expected prevertebral hematoma. There is also anterior neck soft contusion, right eccentric, at the level of the strap muscles. 3. No evidence of intracranial injury. Electronically Signed   By: Tiburcio Pea M.D.   On: 04/11/2021 04:43   CT CHEST W CONTRAST  Result Date: 04/11/2021 CLINICAL DATA:  Critical poly trauma. EXAM: CT CHEST, ABDOMEN, AND PELVIS WITH CONTRAST TECHNIQUE: Multidetector CT imaging of the chest, abdomen and pelvis was performed following the standard protocol during bolus administration of intravenous contrast. CONTRAST:  OMNIPAQUE IOHEXOL 300 MG/ML  SOLN COMPARISON:  None. FINDINGS: CT CHEST FINDINGS Cardiovascular: Normal heart size. No pericardial effusion. No evidence of great vessel injury Mediastinum/Nodes: No hematoma or pneumomediastinum Lungs/Pleura: Minimal ground-glass density in the dependent right lung attributed atelectasis. Secretions in the right bronchus intermedius. No hemothorax or pneumothorax. Musculoskeletal: Diffuse posterior element ankylosis. Superimposed bulky spurring at the open T5-6 level, with severe foraminal impingement and cord impingement from behind. Similar finding seen at T11-12. CT ABDOMEN PELVIS FINDINGS Hepatobiliary: No hepatic injury or perihepatic hematoma. Gallbladder is unremarkable. Pancreas: Negative Spleen: No splenic injury or perisplenic hematoma. Adrenals/Urinary Tract: Slight thickening at the left adrenal gland but not convincing for hemorrhage. No evidence of renal injury. Small bilateral renal calculi and cystic densities. Stomach/Bowel: No evidence of injury. Vascular/Lymphatic: Fat stranding in the retroperitoneum about the aorta in the hepatic cava at the level of the hiatus to the IMA takeoff, attributed to the spine fracture. No active hemorrhage or  discrete vessel injury seen. Reproductive: Negative Other: No ascites or pneumoperitoneum Musculoskeletal: L1-2 fracture dislocation traversing the disc space, L1 inferior endplate, and fused posterior elements. There is anterolisthesis and fracture widening with severe spinal stenosis and probable canal hemorrhage. Definite thecal sac involvement. Hypertrophic posterior elements especially at L3-4 with spinal stenosis. Superior endplate fractures with sclerosis at T10 and L1, nonacute. Rigid spine, sacroiliac ankylosis, and right hip osteoarthritis. IMPRESSION: 1. Rigid spine fracture with marked displacement and spinal stenosis at L1-2. 2. Retroperitoneal hemorrhage due to #1. 3. No evidence of visceral injury. 4. Incidental findings noted above. Electronically Signed   By: Tiburcio Pea M.D.   On: 04/11/2021 04:52   CT CERVICAL SPINE WO CONTRAST  Result Date: 04/11/2021 CLINICAL DATA:  Critical poly trauma.  Fall through ceiling EXAM: CT HEAD WITHOUT CONTRAST CT CERVICAL SPINE WITHOUT CONTRAST TECHNIQUE: Multidetector CT imaging of the head and cervical spine was performed following the standard protocol without intravenous contrast. Multiplanar CT image reconstructions of the cervical spine were also generated. COMPARISON:  None. FINDINGS: CT HEAD FINDINGS Brain: No evidence of acute infarction, hemorrhage, hydrocephalus, extra-axial collection or mass lesion/mass effect. Vascular: No hyperdense vessel or unexpected calcification. Skull: Negative for fracture Sinuses/Orbits: No visible injury Other: Generalized motion artifact CT CERVICAL SPINE FINDINGS Alignment: Traumatic widening and retrolisthesis at C2-3. Retrolisthesis measures 5 mm. Skull base and vertebrae: Rigid spine consistent with ankylosing spondylitis with fracture at the C2-3 level involving the disc space, left ring of C1, right lateral mass C3, in the right superior and anterior corner of C3. Prior dens fracture repair with screw.  Ankylosis continues from C1 into the thoracic spine. Soft tissues and spinal canal: Expected posterior vertebral hemorrhage with anterior displacement of the airway. There is also swelling to the anterior neck, right eccentric at the level of the strap muscles. No cartilage fracture is noted. Disc levels:  Ankylosis. Upper chest: Reported separately Critical Value/emergent results were called by telephone at the time of interpretation on 04/11/2021 at 4:38 am to provider [facility] , who verbally acknowledged these results. IMPRESSION: 1. Unstable rigid spine fracture at C2-3 with fracture distraction and retrolisthesis causing canal stenosis. 2. Airway displacement by expected prevertebral hematoma. There is also anterior neck soft contusion, right eccentric, at the level of the  strap muscles. 3. No evidence of intracranial injury. Electronically Signed   By: Tiburcio Pea M.D.   On: 04/11/2021 04:43   CT ABDOMEN PELVIS W CONTRAST  Result Date: 04/11/2021 CLINICAL DATA:  Critical poly trauma. EXAM: CT CHEST, ABDOMEN, AND PELVIS WITH CONTRAST TECHNIQUE: Multidetector CT imaging of the chest, abdomen and pelvis was performed following the standard protocol during bolus administration of intravenous contrast. CONTRAST:  OMNIPAQUE IOHEXOL 300 MG/ML  SOLN COMPARISON:  None. FINDINGS: CT CHEST FINDINGS Cardiovascular: Normal heart size. No pericardial effusion. No evidence of great vessel injury Mediastinum/Nodes: No hematoma or pneumomediastinum Lungs/Pleura: Minimal ground-glass density in the dependent right lung attributed atelectasis. Secretions in the right bronchus intermedius. No hemothorax or pneumothorax. Musculoskeletal: Diffuse posterior element ankylosis. Superimposed bulky spurring at the open T5-6 level, with severe foraminal impingement and cord impingement from behind. Similar finding seen at T11-12. CT ABDOMEN PELVIS FINDINGS Hepatobiliary: No hepatic injury or perihepatic hematoma.  Gallbladder is unremarkable. Pancreas: Negative Spleen: No splenic injury or perisplenic hematoma. Adrenals/Urinary Tract: Slight thickening at the left adrenal gland but not convincing for hemorrhage. No evidence of renal injury. Small bilateral renal calculi and cystic densities. Stomach/Bowel: No evidence of injury. Vascular/Lymphatic: Fat stranding in the retroperitoneum about the aorta in the hepatic cava at the level of the hiatus to the IMA takeoff, attributed to the spine fracture. No active hemorrhage or discrete vessel injury seen. Reproductive: Negative Other: No ascites or pneumoperitoneum Musculoskeletal: L1-2 fracture dislocation traversing the disc space, L1 inferior endplate, and fused posterior elements. There is anterolisthesis and fracture widening with severe spinal stenosis and probable canal hemorrhage. Definite thecal sac involvement. Hypertrophic posterior elements especially at L3-4 with spinal stenosis. Superior endplate fractures with sclerosis at T10 and L1, nonacute. Rigid spine, sacroiliac ankylosis, and right hip osteoarthritis. IMPRESSION: 1. Rigid spine fracture with marked displacement and spinal stenosis at L1-2. 2. Retroperitoneal hemorrhage due to #1. 3. No evidence of visceral injury. 4. Incidental findings noted above. Electronically Signed   By: Tiburcio Pea M.D.   On: 04/11/2021 04:52   DG Pelvis Portable  Result Date: 04/11/2021 CLINICAL DATA:  Critical poly trauma. EXAM: PORTABLE PELVIS 1 VIEWS COMPARISON:  None. FINDINGS: Sacroiliac ankylosis with spurring. No acute fracture or subluxation involving the pelvis. Degenerative spurring at the right hip. IMPRESSION: 1. No acute finding in the pelvis. 2. Bilateral sacroiliac ankylosis. Electronically Signed   By: Tiburcio Pea M.D.   On: 04/11/2021 04:25   DG Chest Port 1 View  Result Date: 04/11/2021 CLINICAL DATA:  Level 2 trauma due to fall. EXAM: PORTABLE CHEST 1 VIEW COMPARISON:  None. FINDINGS: Low volume  rotated chest. There is no edema, consolidation, effusion, or pneumothorax. No detected fracture IMPRESSION: Negative rotated chest radiograph. Electronically Signed   By: Tiburcio Pea M.D.   On: 04/11/2021 04:23    ROS -Unable to obtain due to condition of patient   PE Blood pressure 95/61, pulse (!) 37, temperature (!) 96.6 F (35.9 C), temperature source Temporal, resp. rate 13, SpO2 100 %. Physical Exam Constitutional: Intubated, no obvious deformities Eyes: Moist conjunctiva; no lid lag; anicteric; PERRL Neck: Trachea midline; no thyromegaly Lungs: No respiratory effort; CTAB; no tactile fremitus CV: RRR; no palpable thrills; no pitting edema GI: Abd soft, nondistended; no palpable hepatosplenomegaly MSK: No ROM of any extremity; no clubbing/cyanosis; no deformities Psychiatric: Unable to assess 2/2 condition of pt Lymphatic: No palpable cervical or axillary lymphadenopathy  Results for orders placed or performed during the hospital encounter  of 04/11/21 (from the past 48 hour(s))  Type and screen [facility]     Status: None (Preliminary result)   Collection Time: 04/11/21  3:52 AM  Result Value Ref Range   ABO/RH(D) PENDING    Antibody Screen PENDING    Sample Expiration      04/14/2021,2359 Performed at [facility], [address] [place], [place] [address]   Comprehensive metabolic panel     Status: Abnormal   Collection Time: 04/11/21  3:54 AM  Result Value Ref Range   Sodium 135 135 - 145 mmol/L   Potassium 3.6 3.5 - 5.1 mmol/L   Chloride 105 98 - 111 mmol/L   CO2 21 (L) 22 - 32 mmol/L   Glucose, Bld 172 (H) 70 - 99 mg/dL    Comment: Glucose reference range applies only to samples taken after fasting for at least 8 hours.   BUN 10 6 - 20 mg/dL   Creatinine, Ser 1.54 0.61 - 1.24 mg/dL   Calcium 8.4 (L) 8.9 - 10.3 mg/dL   Total Protein 7.0 6.5 - 8.1 g/dL   Albumin 3.4 (L) 3.5 - 5.0 g/dL   AST 38 15 - 41 U/L   ALT 23 0 - 44 U/L    Alkaline Phosphatase 64 38 - 126 U/L   Total Bilirubin 0.9 0.3 - 1.2 mg/dL   GFR, Estimated >00 >86 mL/min    Comment: (NOTE) Calculated using the CKD-EPI Creatinine Equation (2021)    Anion gap 9 5 - 15    Comment: Performed at [facility], [address] [place], [place] [address]  CBC     Status: Abnormal   Collection Time: 04/11/21  3:54 AM  Result Value Ref Range   WBC 16.9 (H) 4.0 - 10.5 K/uL   RBC 3.85 (L) 4.22 - 5.81 MIL/uL   Hemoglobin 12.5 (L) 13.0 - 17.0 g/dL   HCT 09.3 (L) 26.7 - 12.4 %   MCV 98.2 80.0 - 100.0 fL   MCH 32.5 26.0 - 34.0 pg   MCHC 33.1 30.0 - 36.0 g/dL   RDW 58.0 99.8 - 33.8 %   Platelets 230 150 - 400 K/uL   nRBC 0.0 0.0 - 0.2 %    Comment: Performed at [facility], [address], [place], [place] [address]  Ethanol     Status: None   Collection Time: 04/11/21  3:54 AM  Result Value Ref Range   Alcohol, Ethyl (B) <10 <10 mg/dL    Comment: (NOTE) Lowest detectable limit for serum alcohol is 10 mg/dL.  For medical purposes only. Performed at [facility], [address], [place], [place] [address]   Protime-INR     Status: None   Collection Time: 04/11/21  3:54 AM  Result Value Ref Range   Prothrombin Time 13.9 11.4 - 15.2 seconds   INR 1.1 0.8 - 1.2    Comment: (NOTE) INR goal varies based on device and disease states. Performed at [facility], [address] [place], [place] [address]   Sample to Blood Bank     Status: None   Collection Time: 04/11/21  3:58 AM  Result Value Ref Range   Blood Bank Specimen SAMPLE AVAILABLE FOR TESTING    Sample Expiration      04/14/2021,2359 Performed at [facility], [address] [place], [place] [address]   I-Stat Chem 8, ED     Status: Abnormal  Collection Time: 04/11/21  4:07 AM  Result Value Ref Range   Sodium 138 135 - 145 mmol/L   Potassium 3.6 3.5 - 5.1 mmol/L   Chloride 104 98 - 111 mmol/L   BUN 10 6 - 20 mg/dL   Creatinine, Ser 1.61 (L) 0.61 - 1.24  mg/dL   Glucose, Bld 096 (H) 70 - 99 mg/dL    Comment: Glucose reference range applies only to samples taken after fasting for at least 8 hours.   Calcium, Ion 1.12 (L) 1.15 - 1.40 mmol/L   TCO2 24 22 - 32 mmol/L   Hemoglobin 12.9 (L) 13.0 - 17.0 g/dL   HCT 04.5 (L) 40.9 - 81.1 %    CT HEAD WO CONTRAST  Result Date: 04/11/2021 CLINICAL DATA:  Critical poly trauma.  Fall through ceiling EXAM: CT HEAD WITHOUT CONTRAST CT CERVICAL SPINE WITHOUT CONTRAST TECHNIQUE: Multidetector CT imaging of the head and cervical spine was performed following the standard protocol without intravenous contrast. Multiplanar CT image reconstructions of the cervical spine were also generated. COMPARISON:  None. FINDINGS: CT HEAD FINDINGS Brain: No evidence of acute infarction, hemorrhage, hydrocephalus, extra-axial collection or mass lesion/mass effect. Vascular: No hyperdense vessel or unexpected calcification. Skull: Negative for fracture Sinuses/Orbits: No visible injury Other: Generalized motion artifact CT CERVICAL SPINE FINDINGS Alignment: Traumatic widening and retrolisthesis at C2-3. Retrolisthesis measures 5 mm. Skull base and vertebrae: Rigid spine consistent with ankylosing spondylitis with fracture at the C2-3 level involving the disc space, left ring of C1, right lateral mass C3, in the right superior and anterior corner of C3. Prior dens fracture repair with screw. Ankylosis continues from C1 into the thoracic spine. Soft tissues and spinal canal: Expected posterior vertebral hemorrhage with anterior displacement of the airway. There is also swelling to the anterior neck, right eccentric at the level of the strap muscles. No cartilage fracture is noted. Disc levels:  Ankylosis. Upper chest: Reported separately Critical Value/emergent results were called by telephone at the time of interpretation on 04/11/2021 at 4:38 am to provider [facility] , who verbally acknowledged these results. IMPRESSION: 1. Unstable  rigid spine fracture at C2-3 with fracture distraction and retrolisthesis causing canal stenosis. 2. Airway displacement by expected prevertebral hematoma. There is also anterior neck soft contusion, right eccentric, at the level of the strap muscles. 3. No evidence of intracranial injury. Electronically Signed   By: Tiburcio Pea M.D.   On: 04/11/2021 04:43   CT CHEST W CONTRAST  Result Date: 04/11/2021 CLINICAL DATA:  Critical poly trauma. EXAM: CT CHEST, ABDOMEN, AND PELVIS WITH CONTRAST TECHNIQUE: Multidetector CT imaging of the chest, abdomen and pelvis was performed following the standard protocol during bolus administration of intravenous contrast. CONTRAST:  OMNIPAQUE IOHEXOL 300 MG/ML  SOLN COMPARISON:  None. FINDINGS: CT CHEST FINDINGS Cardiovascular: Normal heart size. No pericardial effusion. No evidence of great vessel injury Mediastinum/Nodes: No hematoma or pneumomediastinum Lungs/Pleura: Minimal ground-glass density in the dependent right lung attributed atelectasis. Secretions in the right bronchus intermedius. No hemothorax or pneumothorax. Musculoskeletal: Diffuse posterior element ankylosis. Superimposed bulky spurring at the open T5-6 level, with severe foraminal impingement and cord impingement from behind. Similar finding seen at T11-12. CT ABDOMEN PELVIS FINDINGS Hepatobiliary: No hepatic injury or perihepatic hematoma. Gallbladder is unremarkable. Pancreas: Negative Spleen: No splenic injury or perisplenic hematoma. Adrenals/Urinary Tract: Slight thickening at the left adrenal gland but not convincing for hemorrhage. No evidence of renal injury. Small bilateral renal calculi and cystic densities. Stomach/Bowel: No evidence of injury.  Vascular/Lymphatic: Fat stranding in the retroperitoneum about the aorta in the hepatic cava at the level of the hiatus to the IMA takeoff, attributed to the spine fracture. No active hemorrhage or discrete vessel injury seen. Reproductive: Negative  Other: No ascites or pneumoperitoneum Musculoskeletal: L1-2 fracture dislocation traversing the disc space, L1 inferior endplate, and fused posterior elements. There is anterolisthesis and fracture widening with severe spinal stenosis and probable canal hemorrhage. Definite thecal sac involvement. Hypertrophic posterior elements especially at L3-4 with spinal stenosis. Superior endplate fractures with sclerosis at T10 and L1, nonacute. Rigid spine, sacroiliac ankylosis, and right hip osteoarthritis. IMPRESSION: 1. Rigid spine fracture with marked displacement and spinal stenosis at L1-2. 2. Retroperitoneal hemorrhage due to #1. 3. No evidence of visceral injury. 4. Incidental findings noted above. Electronically Signed   By: Tiburcio Pea M.D.   On: 04/11/2021 04:52   CT CERVICAL SPINE WO CONTRAST  Result Date: 04/11/2021 CLINICAL DATA:  Critical poly trauma.  Fall through ceiling EXAM: CT HEAD WITHOUT CONTRAST CT CERVICAL SPINE WITHOUT CONTRAST TECHNIQUE: Multidetector CT imaging of the head and cervical spine was performed following the standard protocol without intravenous contrast. Multiplanar CT image reconstructions of the cervical spine were also generated. COMPARISON:  None. FINDINGS: CT HEAD FINDINGS Brain: No evidence of acute infarction, hemorrhage, hydrocephalus, extra-axial collection or mass lesion/mass effect. Vascular: No hyperdense vessel or unexpected calcification. Skull: Negative for fracture Sinuses/Orbits: No visible injury Other: Generalized motion artifact CT CERVICAL SPINE FINDINGS Alignment: Traumatic widening and retrolisthesis at C2-3. Retrolisthesis measures 5 mm. Skull base and vertebrae: Rigid spine consistent with ankylosing spondylitis with fracture at the C2-3 level involving the disc space, left ring of C1, right lateral mass C3, in the right superior and anterior corner of C3. Prior dens fracture repair with screw. Ankylosis continues from C1 into the thoracic spine. Soft  tissues and spinal canal: Expected posterior vertebral hemorrhage with anterior displacement of the airway. There is also swelling to the anterior neck, right eccentric at the level of the strap muscles. No cartilage fracture is noted. Disc levels:  Ankylosis. Upper chest: Reported separately Critical Value/emergent results were called by telephone at the time of interpretation on 04/11/2021 at 4:38 am to provider Surgery [facility] , who verbally acknowledged these results. IMPRESSION: 1. Unstable rigid spine fracture at C2-3 with fracture distraction and retrolisthesis causing canal stenosis. 2. Airway displacement by expected prevertebral hematoma. There is also anterior neck soft contusion, right eccentric, at the level of the strap muscles. 3. No evidence of intracranial injury. Electronically Signed   By: Tiburcio Pea M.D.   On: 04/11/2021 04:43   CT ABDOMEN PELVIS W CONTRAST  Result Date: 04/11/2021 CLINICAL DATA:  Critical poly trauma. EXAM: CT CHEST, ABDOMEN, AND PELVIS WITH CONTRAST TECHNIQUE: Multidetector CT imaging of the chest, abdomen and pelvis was performed following the standard protocol during bolus administration of intravenous contrast. CONTRAST:  OMNIPAQUE IOHEXOL 300 MG/ML  SOLN COMPARISON:  None. FINDINGS: CT CHEST FINDINGS Cardiovascular: Normal heart size. No pericardial effusion. No evidence of great vessel injury Mediastinum/Nodes: No hematoma or pneumomediastinum Lungs/Pleura: Minimal ground-glass density in the dependent right lung attributed atelectasis. Secretions in the right bronchus intermedius. No hemothorax or pneumothorax. Musculoskeletal: Diffuse posterior element ankylosis. Superimposed bulky spurring at the open T5-6 level, with severe foraminal impingement and cord impingement from behind. Similar finding seen at T11-12. CT ABDOMEN PELVIS FINDINGS Hepatobiliary: No hepatic injury or perihepatic hematoma. Gallbladder is unremarkable. Pancreas: Negative Spleen: No  splenic injury or perisplenic hematoma.  Adrenals/Urinary Tract: Slight thickening at the left adrenal gland but not convincing for hemorrhage. No evidence of renal injury. Small bilateral renal calculi and cystic densities. Stomach/Bowel: No evidence of injury. Vascular/Lymphatic: Fat stranding in the retroperitoneum about the aorta in the hepatic cava at the level of the hiatus to the IMA takeoff, attributed to the spine fracture. No active hemorrhage or discrete vessel injury seen. Reproductive: Negative Other: No ascites or pneumoperitoneum Musculoskeletal: L1-2 fracture dislocation traversing the disc space, L1 inferior endplate, and fused posterior elements. There is anterolisthesis and fracture widening with severe spinal stenosis and probable canal hemorrhage. Definite thecal sac involvement. Hypertrophic posterior elements especially at L3-4 with spinal stenosis. Superior endplate fractures with sclerosis at T10 and L1, nonacute. Rigid spine, sacroiliac ankylosis, and right hip osteoarthritis. IMPRESSION: 1. Rigid spine fracture with marked displacement and spinal stenosis at L1-2. 2. Retroperitoneal hemorrhage due to #1. 3. No evidence of visceral injury. 4. Incidental findings noted above. Electronically Signed   By: Tiburcio Pea M.D.   On: 04/11/2021 04:52   DG Pelvis Portable  Result Date: 04/11/2021 CLINICAL DATA:  Critical poly trauma. EXAM: PORTABLE PELVIS 1 VIEWS COMPARISON:  None. FINDINGS: Sacroiliac ankylosis with spurring. No acute fracture or subluxation involving the pelvis. Degenerative spurring at the right hip. IMPRESSION: 1. No acute finding in the pelvis. 2. Bilateral sacroiliac ankylosis. Electronically Signed   By: Tiburcio Pea M.D.   On: 04/11/2021 04:25   DG Chest Port 1 View  Result Date: 04/11/2021 CLINICAL DATA:  Level 2 trauma due to fall. EXAM: PORTABLE CHEST 1 VIEW COMPARISON:  None. FINDINGS: Low volume rotated chest. There is no edema, consolidation, effusion, or  pneumothorax. No detected fracture IMPRESSION: Negative rotated chest radiograph. Electronically Signed   By: Tiburcio Pea M.D.   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 Wynell Balloon- admit to ICU  CRITICAL CARE Performed by: Andria Meuse   Total critical care time: 90 minutes  Critical care time was exclusive of separately billable procedures and treating other patients.  Critical care was necessary to treat or prevent imminent or life-threatening deterioration.  Critical care was time spent personally by me on the following activities: development of treatment plan with patient and/or surrogate as well as nursing, discussions with consultants, evaluation of patient's response to treatment, examination of patient, obtaining history from patient or surrogate, ordering and performing treatments and interventions, ordering and review of laboratory studies, ordering and review of radiographic studies, pulse oximetry and re-evaluation of patient's condition.   Marin Olp, MD [facility] Use [email or web address] to contact on call provider

## 2021-04-11 NOTE — ED Notes (Signed)
Pt back from Ct with RN.

## 2021-04-11 NOTE — ED Notes (Signed)
ETT placed

## 2021-04-11 NOTE — Progress Notes (Signed)
Orthopedic Tech Progress Note Patient Details:  Julian Allen 07-28-1980 [id number]  Patient ID: Julian Allen, male   DOB: 10/20/1980, 40 y.o.   MRN: [id number] Attended trauma page [place] Post 04/11/2021, 6:10 AM

## 2021-04-11 NOTE — Anesthesia Procedure Notes (Signed)
Arterial Line Insertion Start/End10/08/2020 10:58 AM Performed by: Dairl Ponder, CRNA  Patient location: Pre-op. Preanesthetic checklist: patient identified, IV checked, site marked, risks and benefits discussed, surgical consent, monitors and equipment checked, pre-op evaluation, timeout performed and anesthesia consent Lidocaine 1% used for infiltration Left, radial was placed Catheter size: 20 G Hand hygiene performed  and maximum sterile barriers used   Attempts: 1 Procedure performed without using ultrasound guided technique. Following insertion, dressing applied. Post procedure assessment: normal and unchanged

## 2021-04-11 NOTE — Progress Notes (Signed)
Patient off the unit to OR. Father is at the bedside. Consent is on the chart needs to be completed by Dr. Jordan Likes.

## 2021-04-11 NOTE — ED Provider Notes (Signed)
[facility] EMERGENCY DEPARTMENT Provider Note   CSN: [id number] Arrival date & time: 04/11/21  0348     History No chief complaint on file.   Julian Allen is a 40 y.o. male.  HPI     This is a 40 year old male with a history of Crohn's disease and seizures who presents following a fall.  Per EMS report, he fell through the attic into the room approximately 10 feet.  He was found on the other side of the door.  Unsure of exact timing when he fell.  Patient does not remember falling.  EMS states that he thought he was in his room.  He fell onto some exercise equipment.  Mostly complaining of back pain.  EMS did not note any neurologic deficits.  Patient reports he takes Keppra for seizures.  Reports compliance and no recent breakthrough seizures.  Rates his pain at 10 out of 10.  Per EMS vital signs were stable in route with the exception of O2 sats in the mid 80s.  He was placed on 6 L.  He is not a smoker.  No known history of hypoxia.  Denies chest pain or shortness of breath.  Of note, he was placed in a c-collar in route.  However he was allowed to take a position of comfort on his right side.  No past medical history on file.  Patient Active Problem List   Diagnosis Date Noted   Fall, initial encounter 04/11/2021    Seizures Crohns dz Ankylosing Spondylitis  No family history on file.     Home Medications Prior to Admission medications   Medication Sig Start Date End Date Taking? Authorizing Provider  azaTHIOprine (IMURAN) 50 MG tablet Take 150 mg by mouth daily. 02/03/21  Yes [provider]  HUMIRA PEN 40 MG/0.4ML PNKT Inject 40 mg into the skin once a week. 03/29/21  Yes [provider]  levETIRAcetam (KEPPRA) 500 MG tablet Take 500 mg by mouth 2 (two) times daily. 12/19/20  Yes [provider]    Allergies    Patient has no known allergies.  Review of Systems   Review of Systems  Constitutional:  Negative for fever.   Respiratory:  Negative for shortness of breath.   Cardiovascular:  Negative for chest pain.  Gastrointestinal:  Negative for abdominal pain, nausea and vomiting.  Genitourinary:  Positive for flank pain. Negative for dysuria.  Musculoskeletal:  Positive for back pain.  All other systems reviewed and are negative.  Physical Exam Updated Vital Signs BP (!) 113/52   Pulse (!) 127   Temp (!) 96.6 F (35.9 C) (Temporal)   Resp (!) 8   Ht 1.727 m (5\' 8" )   SpO2 100%   Physical Exam Vitals and nursing note reviewed.  Constitutional:      Appearance: He is well-developed.     Comments: ABCs intact  HENT:     Head: Normocephalic.     Comments: Dried blood noted about the right face and neck, source unclear    Nose: Nose normal.     Mouth/Throat:     Mouth: Mucous membranes are moist.  Eyes:     Extraocular Movements: Extraocular movements intact.     Pupils: Pupils are equal, round, and reactive to light.  Neck:     Comments: C-collar in place Cardiovascular:     Rate and Rhythm: Normal rate and regular rhythm.     Heart sounds: Normal heart sounds. No murmur heard. Pulmonary:  Effort: No respiratory distress.     Breath sounds: Normal breath sounds. No wheezing.     Comments: Mouth breathing, clear breath sounds bilaterally Chest:     Chest wall: No tenderness.  Abdominal:     General: Bowel sounds are normal.     Palpations: Abdomen is soft.     Tenderness: There is abdominal tenderness. There is no rebound.  Musculoskeletal:     Comments: Midthoracic tenderness to palpation, step-off noted,   Skin:    General: Skin is warm and dry.     Comments: Large abrasion/hematoma to the left flank  Neurological:     Mental Status: He is alert and oriented to person, place, and time.     Comments: Patient moves all 4 extremities, 5 out of 5 grip strength and plantar and dorsiflexion on bilateral feet  Psychiatric:        Mood and Affect: Mood normal.    ED Results /  Procedures / Treatments   Labs (all labs ordered are listed, but only abnormal results are displayed) Labs Reviewed  COMPREHENSIVE METABOLIC PANEL - Abnormal; Notable for the following components:      Result Value   CO2 21 (*)    Glucose, Bld 172 (*)    Calcium 8.4 (*)    Albumin 3.4 (*)    All other components within normal limits  CBC - Abnormal; Notable for the following components:   WBC 16.9 (*)    RBC 3.85 (*)    Hemoglobin 12.5 (*)    HCT 37.8 (*)    All other components within normal limits  I-STAT CHEM 8, ED - Abnormal; Notable for the following components:   Creatinine, Ser 0.60 (*)    Glucose, Bld 170 (*)    Calcium, Ion 1.12 (*)    Hemoglobin 12.9 (*)    HCT 38.0 (*)    All other components within normal limits  RESP PANEL BY RT-PCR (FLU A&B, COVID) ARPGX2  ETHANOL  LACTIC ACID, PLASMA  PROTIME-INR  URINALYSIS, ROUTINE W REFLEX MICROSCOPIC  CK  SAMPLE TO BLOOD BANK  TYPE AND SCREEN  ABO/RH    EKG None  Radiology CT HEAD WO CONTRAST  Result Date: 04/11/2021 CLINICAL DATA:  Critical poly trauma.  Fall through ceiling EXAM: CT HEAD WITHOUT CONTRAST CT CERVICAL SPINE WITHOUT CONTRAST TECHNIQUE: Multidetector CT imaging of the head and cervical spine was performed following the standard protocol without intravenous contrast. Multiplanar CT image reconstructions of the cervical spine were also generated. COMPARISON:  None. FINDINGS: CT HEAD FINDINGS Brain: No evidence of acute infarction, hemorrhage, hydrocephalus, extra-axial collection or mass lesion/mass effect. Vascular: No hyperdense vessel or unexpected calcification. Skull: Negative for fracture Sinuses/Orbits: No visible injury Other: Generalized motion artifact CT CERVICAL SPINE FINDINGS Alignment: Traumatic widening and retrolisthesis at C2-3. Retrolisthesis measures 5 mm. Skull base and vertebrae: Rigid spine consistent with ankylosing spondylitis with fracture at the C2-3 level involving the disc space,  left ring of C1, right lateral mass C3, in the right superior and anterior corner of C3. Prior dens fracture repair with screw. Ankylosis continues from C1 into the thoracic spine. Soft tissues and spinal canal: Expected posterior vertebral hemorrhage with anterior displacement of the airway. There is also swelling to the anterior neck, right eccentric at the level of the strap muscles. No cartilage fracture is noted. Disc levels:  Ankylosis. Upper chest: Reported separately Critical Value/emergent results were called by telephone at the time of interpretation on 04/11/2021 at 4:38 am to  provider Ross Marcus , who verbally acknowledged these results. IMPRESSION: 1. Unstable rigid spine fracture at C2-3 with fracture distraction and retrolisthesis causing canal stenosis. 2. Airway displacement by expected prevertebral hematoma. There is also anterior neck soft contusion, right eccentric, at the level of the strap muscles. 3. No evidence of intracranial injury. Electronically Signed   By: Tiburcio Pea M.D.   On: 04/11/2021 04:43   CT CHEST W CONTRAST  Addendum Date: 04/11/2021   ADDENDUM REPORT: 04/11/2021 05:20 ADDENDUM: Omitted listing of transverse process fractures which involve L2 and L3 bilaterally, and L4 on the left. Electronically Signed   By: Tiburcio Pea M.D.   On: 04/11/2021 05:20   Result Date: 04/11/2021 CLINICAL DATA:  Critical poly trauma. EXAM: CT CHEST, ABDOMEN, AND PELVIS WITH CONTRAST TECHNIQUE: Multidetector CT imaging of the chest, abdomen and pelvis was performed following the standard protocol during bolus administration of intravenous contrast. CONTRAST:  OMNIPAQUE IOHEXOL 300 MG/ML  SOLN COMPARISON:  None. FINDINGS: CT CHEST FINDINGS Cardiovascular: Normal heart size. No pericardial effusion. No evidence of great vessel injury Mediastinum/Nodes: No hematoma or pneumomediastinum Lungs/Pleura: Minimal ground-glass density in the dependent right lung attributed atelectasis.  Secretions in the right bronchus intermedius. No hemothorax or pneumothorax. Musculoskeletal: Diffuse posterior element ankylosis. Superimposed bulky spurring at the open T5-6 level, with severe foraminal impingement and cord impingement from behind. Similar finding seen at T11-12. CT ABDOMEN PELVIS FINDINGS Hepatobiliary: No hepatic injury or perihepatic hematoma. Gallbladder is unremarkable. Pancreas: Negative Spleen: No splenic injury or perisplenic hematoma. Adrenals/Urinary Tract: Slight thickening at the left adrenal gland but not convincing for hemorrhage. No evidence of renal injury. Small bilateral renal calculi and cystic densities. Stomach/Bowel: No evidence of injury. Vascular/Lymphatic: Fat stranding in the retroperitoneum about the aorta in the hepatic cava at the level of the hiatus to the IMA takeoff, attributed to the spine fracture. No active hemorrhage or discrete vessel injury seen. Reproductive: Negative Other: No ascites or pneumoperitoneum Musculoskeletal: L1-2 fracture dislocation traversing the disc space, L1 inferior endplate, and fused posterior elements. There is anterolisthesis and fracture widening with severe spinal stenosis and probable canal hemorrhage. Definite thecal sac involvement. Hypertrophic posterior elements especially at L3-4 with spinal stenosis. Superior endplate fractures with sclerosis at T10 and L1, nonacute. Rigid spine, sacroiliac ankylosis, and right hip osteoarthritis. IMPRESSION: 1. Rigid spine fracture with marked displacement and spinal stenosis at L1-2. 2. Retroperitoneal hemorrhage due to #1. 3. No evidence of visceral injury. 4. Incidental findings noted above. Electronically Signed: By: Tiburcio Pea M.D. On: 04/11/2021 04:52   CT CERVICAL SPINE WO CONTRAST  Result Date: 04/11/2021 CLINICAL DATA:  Critical poly trauma.  Fall through ceiling EXAM: CT HEAD WITHOUT CONTRAST CT CERVICAL SPINE WITHOUT CONTRAST TECHNIQUE: Multidetector CT imaging of the head  and cervical spine was performed following the standard protocol without intravenous contrast. Multiplanar CT image reconstructions of the cervical spine were also generated. COMPARISON:  None. FINDINGS: CT HEAD FINDINGS Brain: No evidence of acute infarction, hemorrhage, hydrocephalus, extra-axial collection or mass lesion/mass effect. Vascular: No hyperdense vessel or unexpected calcification. Skull: Negative for fracture Sinuses/Orbits: No visible injury Other: Generalized motion artifact CT CERVICAL SPINE FINDINGS Alignment: Traumatic widening and retrolisthesis at C2-3. Retrolisthesis measures 5 mm. Skull base and vertebrae: Rigid spine consistent with ankylosing spondylitis with fracture at the C2-3 level involving the disc space, left ring of C1, right lateral mass C3, in the right superior and anterior corner of C3. Prior dens fracture repair with screw. Ankylosis continues from  C1 into the thoracic spine. Soft tissues and spinal canal: Expected posterior vertebral hemorrhage with anterior displacement of the airway. There is also swelling to the anterior neck, right eccentric at the level of the strap muscles. No cartilage fracture is noted. Disc levels:  Ankylosis. Upper chest: Reported separately Critical Value/emergent results were called by telephone at the time of interpretation on 04/11/2021 at 4:38 am to provider [facility] , who verbally acknowledged these results. IMPRESSION: 1. Unstable rigid spine fracture at C2-3 with fracture distraction and retrolisthesis causing canal stenosis. 2. Airway displacement by expected prevertebral hematoma. There is also anterior neck soft contusion, right eccentric, at the level of the strap muscles. 3. No evidence of intracranial injury. Electronically Signed   By: Tiburcio Pea M.D.   On: 04/11/2021 04:43   CT ABDOMEN PELVIS W CONTRAST  Addendum Date: 04/11/2021   ADDENDUM REPORT: 04/11/2021 05:20 ADDENDUM: Omitted listing of transverse process  fractures which involve L2 and L3 bilaterally, and L4 on the left. Electronically Signed   By: Tiburcio Pea M.D.   On: 04/11/2021 05:20   Result Date: 04/11/2021 CLINICAL DATA:  Critical poly trauma. EXAM: CT CHEST, ABDOMEN, AND PELVIS WITH CONTRAST TECHNIQUE: Multidetector CT imaging of the chest, abdomen and pelvis was performed following the standard protocol during bolus administration of intravenous contrast. CONTRAST:  OMNIPAQUE IOHEXOL 300 MG/ML  SOLN COMPARISON:  None. FINDINGS: CT CHEST FINDINGS Cardiovascular: Normal heart size. No pericardial effusion. No evidence of great vessel injury Mediastinum/Nodes: No hematoma or pneumomediastinum Lungs/Pleura: Minimal ground-glass density in the dependent right lung attributed atelectasis. Secretions in the right bronchus intermedius. No hemothorax or pneumothorax. Musculoskeletal: Diffuse posterior element ankylosis. Superimposed bulky spurring at the open T5-6 level, with severe foraminal impingement and cord impingement from behind. Similar finding seen at T11-12. CT ABDOMEN PELVIS FINDINGS Hepatobiliary: No hepatic injury or perihepatic hematoma. Gallbladder is unremarkable. Pancreas: Negative Spleen: No splenic injury or perisplenic hematoma. Adrenals/Urinary Tract: Slight thickening at the left adrenal gland but not convincing for hemorrhage. No evidence of renal injury. Small bilateral renal calculi and cystic densities. Stomach/Bowel: No evidence of injury. Vascular/Lymphatic: Fat stranding in the retroperitoneum about the aorta in the hepatic cava at the level of the hiatus to the IMA takeoff, attributed to the spine fracture. No active hemorrhage or discrete vessel injury seen. Reproductive: Negative Other: No ascites or pneumoperitoneum Musculoskeletal: L1-2 fracture dislocation traversing the disc space, L1 inferior endplate, and fused posterior elements. There is anterolisthesis and fracture widening with severe spinal stenosis and probable  canal hemorrhage. Definite thecal sac involvement. Hypertrophic posterior elements especially at L3-4 with spinal stenosis. Superior endplate fractures with sclerosis at T10 and L1, nonacute. Rigid spine, sacroiliac ankylosis, and right hip osteoarthritis. IMPRESSION: 1. Rigid spine fracture with marked displacement and spinal stenosis at L1-2. 2. Retroperitoneal hemorrhage due to #1. 3. No evidence of visceral injury. 4. Incidental findings noted above. Electronically Signed: By: Tiburcio Pea M.D. On: 04/11/2021 04:52   DG Pelvis Portable  Result Date: 04/11/2021 CLINICAL DATA:  Critical poly trauma. EXAM: PORTABLE PELVIS 1 VIEWS COMPARISON:  None. FINDINGS: Sacroiliac ankylosis with spurring. No acute fracture or subluxation involving the pelvis. Degenerative spurring at the right hip. IMPRESSION: 1. No acute finding in the pelvis. 2. Bilateral sacroiliac ankylosis. Electronically Signed   By: Tiburcio Pea M.D.   On: 04/11/2021 04:25   DG Chest Portable 1 View  Result Date: 04/11/2021 CLINICAL DATA:  Intubation EXAM: PORTABLE CHEST 1 VIEW COMPARISON:  Earlier the same  day FINDINGS: New endotracheal tube with tip just below the clavicular heads. The enteric tube reaches the stomach. The lungs remain clear. Normal heart size and mediastinal contours. Left mid clavicle fracture. IMPRESSION: 1. Unremarkable hardware. 2. Left mid clavicle fracture. Electronically Signed   By: Tiburcio Pea M.D.   On: 04/11/2021 05:23   DG Chest Port 1 View  Result Date: 04/11/2021 CLINICAL DATA:  Level 2 trauma due to fall. EXAM: PORTABLE CHEST 1 VIEW COMPARISON:  None. FINDINGS: Low volume rotated chest. There is no edema, consolidation, effusion, or pneumothorax. No detected fracture IMPRESSION: Negative rotated chest radiograph. Electronically Signed   By: Tiburcio Pea M.D.   On: 04/11/2021 04:23    Procedures .Critical Care Performed by: Shon Baton, MD Authorized by: Shon Baton, MD    Critical care provider statement:    Critical care time (minutes):  90   Critical care was necessary to treat or prevent imminent or life-threatening deterioration of the following conditions:  Trauma and shock   Critical care was time spent personally by me on the following activities:  Discussions with consultants, evaluation of patient's response to treatment, examination of patient, ordering and performing treatments and interventions, ordering and review of laboratory studies, ordering and review of radiographic studies, pulse oximetry, re-evaluation of patient's condition, obtaining history from patient or surrogate and review of old charts Procedure Name: Intubation Date/Time: 04/11/2021 5:35 AM Performed by: Shon Baton, MD Pre-anesthesia Checklist: Patient identified Oxygen Delivery Method: Ambu bag Induction Type: Rapid sequence Laryngoscope Size: Glidescope and 3 Grade View: Grade I Tube size: 7.5 mm Number of attempts: 1 Placement Confirmation: CO2 detector, ETT inserted through vocal cords under direct vision and Breath sounds checked- equal and bilateral Tube secured with: ETT holder Dental Injury: Teeth and Oropharynx as per pre-operative assessment  Difficulty Due To: Difficulty was anticipated Comments: Patient with significant C-spine injury.  CT scan reviewed.  Second ED doc and trauma surgery at bedside for backup.  Given his somnolence, I decided to take a look to see if I could see cords with a videoscope.  I was able to see cords and passed the tube without full RSI.  Technically an awake intubation.    Marthenia Rolling Line  Date/Time: 04/11/2021 5:40 AM Performed by: Shon Baton, MD Authorized by: Shon Baton, MD   Consent:    Consent obtained:  Emergent situation Pre-procedure details:    Indication(s): central venous access, hemodynamic monitoring and insufficient peripheral access     Hand hygiene: Hand hygiene performed prior to insertion      Sterile barrier technique: All elements of maximal sterile technique followed     Skin preparation:  Chlorhexidine   Skin preparation agent: Skin preparation agent completely dried prior to procedure   Sedation:    Sedation type:  None Anesthesia:    Anesthesia method:  None Procedure details:    Location:  R subclavian   Site selection rationale:  Trauma with c collar   Patient position:  Supine   Procedural supplies:  Triple lumen   Landmarks identified: yes     Ultrasound guidance: yes     Number of attempts:  2   Successful placement: yes   Post-procedure details:    Post-procedure:  Dressing applied   Assessment:  Blood return through all ports, free fluid flow, no pneumothorax on x-ray and placement verified by x-ray   Procedure completion:  Tolerated   Medications Ordered in ED Medications  0.9 %  sodium chloride infusion (150 mL/hr Intravenous New Bag/Given 04/11/21 0511)  midazolam (VERSED) injection (2 mg Intravenous Given 04/11/21 0503)  norepinephrine (LEVOPHED) 4mg  in premix infusion (10 mcg/min Intravenous Rate/Dose Change 04/11/21 0507)  morphine 4 MG/ML injection 4 mg (4 mg Intravenous Given 04/11/21 0400)  ondansetron (ZOFRAN) injection 4 mg (4 mg Intravenous Given 04/11/21 0401)  Tdap (BOOSTRIX) injection 0.5 mL (0.5 mLs Intramuscular Given 04/11/21 0401)  iohexol (OMNIPAQUE) 300 MG/ML solution 100 mL (100 mLs Intravenous Contrast Given 04/11/21 0430)  LORazepam (ATIVAN) 2 MG/ML injection (0.5 mg  Given 04/11/21 0434)  levETIRAcetam (KEPPRA) IVPB 1000 mg/100 mL premix (0 mg Intravenous Stopped 04/11/21 0442)  phenylephrine 0.4-0.9 MG/10ML-% injection (100 mcg  Given 04/11/21 0501)    ED Course  I have reviewed the triage vital signs and the nursing notes.  Pertinent labs & imaging results that were available during my care of the patient were reviewed by me and considered in my medical decision making (see chart for details).  Clinical Course as of 04/11/21  0533  06/11/21 Apr 11, 2021  0430 Called to the patient's bedside for unresponsiveness and hypoxia.  Question whether the patient was having a seizure.  No tonic-clonic movement noted but gaze was deviated.  Ordered Ativan 0.5 mg.  He did respond a little bit to Ativan.  He remained hypoxic.  While his respiratory rate seems appropriate, he appears to be guppy breathing and does not have good chest rise.  I have reviewed his CT images and he has significant C-spine and L-spine injuries.  Question high cord injury.  O2 sats 70s to 80s.  Patient was upgraded to a level 1 trauma given known injuries and what appears to be the sequelae of a high C-spine injury.  On reassessment given that he was altered and no longer following commands, could not reassess him for movement; however on initial evaluation he was moving all 4 extremities.  Patient was intubated without drugs.  He was noted to be hypotensive postintubation and was started on Levophed.  Central line was placed.  Trauma surgery at the bedside.  Neurosurgery consulted.  Trauma discussed with neurosurgery and they are in route. [CH]    Clinical Course User Index [CH] Donika Butner, Apr 13, 2021, MD   MDM Rules/Calculators/A&P                           Patient initially presented as a level 2 trauma following a fall.  Vital signs were stable in route and his neurologic exam was reassuring.  He had a known deformity to the high lumbar spine.  He was transported in a position of comfort on his right side with c-collar.  When moved over to the trauma gurney, he was logrolled onto his back.  He has significant ankylosing spondylitis and did not tolerate lying flat.  Initial neurologic assessment was intact.  However, patient had progressive hypoxia and guppy breathing as well as altered mental status.  Question whether he may have had a seizure as well although it is unclear to me.  After reviewing his trauma scans, it became clear that he likely was having sequelae of  a high C-spine injury.  He did not have a good chest rise with his respirations.  See clinical course above.  He was intubated without drugs.  He was started on Levophed for hypotension and bradycardia likely secondary to neurogenic shock.  Trauma surgery and neurosurgery consulted.  Repeat neurologic exam was  not able to be done secondary to altered mental status on my repeat assessment and concern for hypoxia and airway.  I independently reviewed his trauma and imaging as well as his subsequent x-rays showing good line placement and ET tube placement.  He was resuscitated with fluids and pressors.  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

## 2021-04-11 NOTE — Anesthesia Postprocedure Evaluation (Signed)
Anesthesia Post Note  Patient: Julian Allen  Procedure(s) Performed: Cervical Two - Cervical Four Laminectomy and Posterior Fusion With Instrumentation (Neck) Lumbar One, Lumbar Two Decompression, Thoracic Twelve - Lumbar Three Posterior Fusion with Instrumentation (Back)     Patient location during evaluation: SICU Anesthesia Type: General Level of consciousness: sedated and patient remains intubated per anesthesia plan Pain management: pain level controlled Vital Signs Assessment: post-procedure vital signs reviewed and stable Respiratory status: patient on ventilator - see flowsheet for VS and patient remains intubated per anesthesia plan Cardiovascular status: stable Anesthetic complications: no   No notable events documented.  Last Vitals:  Vitals:   04/11/21 1558 04/11/21 1600  BP:  109/73  Pulse:  (!) 45  Resp:  17  Temp:  (!) 34.4 C  SpO2: 100% 100%    Last Pain:  Vitals:   04/11/21 1600  TempSrc: Axillary  PainSc:                  Lewie Loron

## 2021-04-11 NOTE — Progress Notes (Signed)
Patient headed to the OR with Dr. Dutch Quint for spinal fusion. Continue with ventilatory support Discussed with father in the room on the morning of surgery.  All questions answered.

## 2021-04-11 NOTE — Brief Op Note (Signed)
04/11/2021  3:24 PM  PATIENT:  Julian Allen  40 y.o. 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, L1-L2 fracture dislocation with spinal cord injury  PROCEDURE:  Procedure(s): Cervical Two - Cervical Four Laminectomy and Posterior Fusion With Instrumentation (N/A) Lumbar One, Lumbar Two Decompression, Thoracic Twelve - Lumbar Three Posterior Fusion with Instrumentation (N/A)  SURGEON:  Surgeon(s) and Role:    Julio Sicks, MD - Primary  PHYSICIAN ASSISTANT:   ASSISTANTS: bergman,NP   ANESTHESIA:   general  EBL:  250 mL   BLOOD ADMINISTERED:none  DRAINS: (Medium) Hemovact drain(s) in the epidural space with  Suction Open   LOCAL MEDICATIONS USED:  NONE  SPECIMEN:  No Specimen  DISPOSITION OF SPECIMEN:  N/A  COUNTS:  YES  TOURNIQUET:  * No tourniquets in log *  DICTATION: .Dragon Dictation  PLAN OF CARE: Admit to inpatient   PATIENT DISPOSITION:  ICU - intubated and critically ill.   Delay start of Pharmacological VTE agent (>24hrs) due to surgical blood loss or risk of bleeding: yes

## 2021-04-11 NOTE — ED Notes (Signed)
EDP at bedside placing central line.  

## 2021-04-11 NOTE — ED Notes (Signed)
0.5 mg ativan given with EDP at bedside

## 2021-04-11 NOTE — Progress Notes (Addendum)
MRI of cervical and lumbar spine complete.  MRI scan of cervical spine once again demonstrates Chance fracture at C2-3 with canal compromise and cord compression.  There is some mild signal abnormality within the cord.  No evidence of frank hematoma.  MRI scan of the lumbar spine demonstrates fracture dislocation distraction injury at L1-L2.  There is significant stenosis and some degree of cord compression at this level as well.  I discussed situation with the patient and his father.  Currently he demonstrates and exam consistent with a very high cervical spinal cord injury however the patient now has some minimal voluntary movement of his left upper extremity and some trace movement in his right shoulder.  I have discussed moving forward emergently with posterior cervical decompression at C2-3 with posterior cervical fusion utilizing lateral mass instrumentation, local autografting and possible bone morphogenic protein.  I have also discussed the need to move forward with posterior thoracic O lumbar decompression and fusion surgery utilizing segmental pedicle screw fixation from T12-L3 in hopes of stabilizing and decompressing his injury at this level.  I discussed the risks and benefits involved with both procedures including not limited to risk of anesthesia, bleeding, infection, CSF leak, spinal cord injury, fusion failure, station failure, continued pain, not benefit.  Patient has been given the option as questions.  He and his father wish to proceed with surgery emergently.

## 2021-04-11 NOTE — ED Notes (Signed)
Neurosurgeon at bedside °

## 2021-04-12 ENCOUNTER — Inpatient Hospital Stay (HOSPITAL_COMMUNITY): Payer: BLUE CROSS/BLUE SHIELD

## 2021-04-12 LAB — POCT I-STAT 7, (LYTES, BLD GAS, ICA,H+H)
Acid-base deficit: 1 mmol/L (ref 0.0–2.0)
Acid-base deficit: 2 mmol/L (ref 0.0–2.0)
Bicarbonate: 27 mmol/L (ref 20.0–28.0)
Bicarbonate: 24 mmol/L (ref 20.0–28.0)
Calcium, Ion: 1.23 mmol/L (ref 1.15–1.40)
Calcium, Ion: 1.21 mmol/L (ref 1.15–1.40)
HCT: 35 % — ABNORMAL LOW (ref 39.0–52.0)
HCT: 32 % — ABNORMAL LOW (ref 39.0–52.0)
Hemoglobin: 11.9 g/dL — ABNORMAL LOW (ref 13.0–17.0)
Hemoglobin: 10.9 g/dL — ABNORMAL LOW (ref 13.0–17.0)
O2 Saturation: 100 %
O2 Saturation: 100 %
Patient temperature: 35.6
Patient temperature: 35.3
Potassium: 4 mmol/L (ref 3.5–5.1)
Potassium: 4 mmol/L (ref 3.5–5.1)
Sodium: 139 mmol/L (ref 135–145)
Sodium: 140 mmol/L (ref 135–145)
TCO2: 29 mmol/L (ref 22–32)
TCO2: 25 mmol/L (ref 22–32)
pCO2 arterial: 54.4 mmHg — ABNORMAL HIGH (ref 32.0–48.0)
pCO2 arterial: 41.9 mmHg (ref 32.0–48.0)
pH, Arterial: 7.296 — ABNORMAL LOW (ref 7.350–7.450)
pH, Arterial: 7.359 (ref 7.350–7.450)
pO2, Arterial: 540 mmHg — ABNORMAL HIGH (ref 83.0–108.0)
pO2, Arterial: 259 mmHg — ABNORMAL HIGH (ref 83.0–108.0)

## 2021-04-12 LAB — BASIC METABOLIC PANEL
Anion gap: 10 (ref 5–15)
BUN: 14 mg/dL (ref 6–20)
CO2: 20 mmol/L — ABNORMAL LOW (ref 22–32)
Calcium: 8.1 mg/dL — ABNORMAL LOW (ref 8.9–10.3)
Chloride: 106 mmol/L (ref 98–111)
Creatinine, Ser: 0.99 mg/dL (ref 0.61–1.24)
GFR, Estimated: >60 mL/min (ref >60)
Glucose, Bld: 215 mg/dL — ABNORMAL HIGH (ref 70–99)
Potassium: 3.6 mmol/L (ref 3.5–5.1)
Sodium: 136 mmol/L (ref 135–145)

## 2021-04-12 LAB — CBC
HCT: 25.1 % — ABNORMAL LOW (ref 39.0–52.0)
Hemoglobin: 8.4 g/dL — ABNORMAL LOW (ref 13.0–17.0)
MCH: 32.3 pg (ref 26.0–34.0)
MCHC: 33.5 g/dL (ref 30.0–36.0)
MCV: 96.5 fL (ref 80.0–100.0)
Platelets: 198 10*3/uL (ref 150–400)
RBC: 2.6 MIL/uL — ABNORMAL LOW (ref 4.22–5.81)
RDW: 14.8 % (ref 11.5–15.5)
WBC: 13.7 10*3/uL — ABNORMAL HIGH (ref 4.0–10.5)
nRBC: 0 % (ref 0.0–0.2)

## 2021-04-12 LAB — PREPARE RBC (CROSSMATCH)

## 2021-04-12 LAB — HIV ANTIBODY (ROUTINE TESTING W REFLEX): HIV Screen 4th Generation wRfx: NONREACTIVE

## 2021-04-12 LAB — GLUCOSE, CAPILLARY
Glucose-Capillary: 152 mg/dL — ABNORMAL HIGH (ref 70–99)
Glucose-Capillary: 126 mg/dL — ABNORMAL HIGH (ref 70–99)

## 2021-04-12 MED ORDER — ACETAMINOPHEN 160 MG/5ML PO SOLN: 650.0000 mg | Freq: Four times a day (QID) | ORAL | Status: DC | PRN

## 2021-04-12 MED ORDER — CHLORHEXIDINE GLUCONATE CLOTH 2 % EX PADS
6.0000 | MEDICATED_PAD | Freq: Every day | CUTANEOUS | Status: DC
  Administered 2021-04-12 – 2021-04-13 (×2): 6 via TOPICAL

## 2021-04-12 MED ORDER — OXYCODONE HCL 5 MG/5ML PO SOLN
10.0000 mg | ORAL | Status: DC | PRN
Start: 2021-04-12 — End: 2021-04-14
  Administered 2021-04-14: 10 mg
  Filled 2021-04-12: qty 10

## 2021-04-12 MED ORDER — INSULIN ASPART 100 UNIT/ML IJ SOLN
0.0000 [IU] | INTRAMUSCULAR | Status: DC
  Administered 2021-04-12: 3 [IU] via SUBCUTANEOUS

## 2021-04-12 MED ORDER — IOHEXOL 350 MG/ML SOLN
75.0000 mL | Freq: Once | INTRAVENOUS | Status: AC | PRN
  Administered 2021-04-12: 75 mL via INTRAVENOUS

## 2021-04-12 NOTE — TOC CAGE-AID Note (Signed)
Transition of Care [facility]) - CAGE-AID Screening   Patient Details  Name: Julian Allen MRN: [id number] Date of Birth: 1981-03-28  [facility] of [facility] CM/SW Contact:    Antaeus Karel C Tarpley-Carter, LCSWA Phone Number: 04/12/2021, 11:46 AM   Clinical Narrative: Pt is unable to participate in Cage Aid.  Pt is intubated.  Glynn Freas Tarpley-Carter, MSW, LCSW-A Pronouns:  She/Her/Hers [facility] Clinical Social Worker Direct Number:  [phone] [email or web address]   CAGE-AID Screening: Substance Abuse Screening unable to be completed due to: : Patient unable to participate (Pt is intubated.)             Substance Abuse Education Offered: No

## 2021-04-12 NOTE — Progress Notes (Signed)
Pt transported to CT and back to 4N21 without any complications.  

## 2021-04-12 NOTE — Progress Notes (Signed)
Initial Nutrition Assessment  DOCUMENTATION CODES:   Not applicable  INTERVENTION:   -If unable to extubate, recommend:  Initiate Pivot 1.5 @ 65 ml/hr via OGT (1560 ml daily)  Tube feeding regimen provides 2340 kcal (98% of needs), 146 grams of protein, and 1173 ml of H2O.    NUTRITION DIAGNOSIS:   Inadequate oral intake related to inability to eat as evidenced by NPO status.  GOAL:   Patient will meet greater than or equal to 90% of their needs  MONITOR:   Vent status, Labs, Weight trends, Skin, I & O's  REASON FOR ASSESSMENT:   Ventilator    ASSESSMENT:   Julian Allen is an 40 y.o. male whom arrived as level 2 activation s/p fall through ceiling at home and landing on exercise equipment at approximately. Fell approx 10 ft.  He was amnestic to the events. Arrived complaining of back pain. Underwent workup in ER and then was upgraded to level 1 when he was not breathing or moving. Did not loose pulses. Reportedly flaccid paralysis, bag mask initiated and intubated.  Pt admitted with C2-C3 fracture with apparent canal stenosis and prevertebral hematoma cuaisn fair way displacement and L1/L2 fx racture dislocation.    10/2- s/p C2-C3-C4 decompressive laminectomy; C2-3-4 posterior lateral fusion utilizing lateral mass instrumentation, local autograft; L1-L2 decompressive laminectomy and reduction of fracture dislocation;  T12 L1-L2-L3 posterior lateral arthrodesis utilizing segmental pedicle screw fixation and local autografting  Patient is currently intubated on ventilator support MV: 9.8 L/min Temp (24hrs), Avg:97.3 F (36.3 C), Min:93.9 F (34.4 C), Max:99.1 F (37.3 C)  Reviewed I/O's: +2.3 L x 24 hours and +4.3 L since admission  UOP: 2.3 L x 24 hours  NGT output: 250 ml x 24 hours  Drain output: 350 ml x 24 hours MAP: 81  Per RN and MD, pt vomited this morning while getting a bath. OGT on low, intermittent suction. No plans to start TF today.    Medications reviewed and include colace, mirlax, precedex, adrenalin, lactated ringers infusion @ 100 ml/hr, keppra, and pitressin.   Labs reviewed.   NUTRITION - FOCUSED PHYSICAL EXAM:  Flowsheet Row Most Recent Value  Orbital Region Mild depletion  Upper Arm Region No depletion  Thoracic and Lumbar Region No depletion  Buccal Region Unable to assess  Temple Region Mild depletion  Clavicle Bone Region No depletion  Clavicle and Acromion Bone Region No depletion  Scapular Bone Region Mild depletion  Dorsal Hand No depletion  Patellar Region No depletion  Anterior Thigh Region No depletion  Posterior Calf Region No depletion  Edema (RD Assessment) None  Hair Reviewed  Eyes Reviewed  Mouth Reviewed  Skin Reviewed  Nails Reviewed       Diet Order:   Diet Order             Diet NPO time specified  Diet effective now                   EDUCATION NEEDS:   Not appropriate for education at this time  Skin:  Skin Assessment: Skin Integrity Issues: Skin Integrity Issues:: Incisions Incisions: closed neck and back  Last BM:  Unknown  Height:   Ht Readings from Last 1 Encounters:  04/11/21 5\' 8"  (1.727 m)    Weight:   Wt Readings from Last 1 Encounters:  04/11/21 75.3 kg    Ideal Body Weight:  70 kg  BMI:  Body mass index is 25.24 kg/m.  Estimated Nutritional Needs:  Kcal:  2400-2600  Protein:  125-150 grams  Fluid:  > 2 L    Levada Schilling, RD, LDN, CDCES Registered Dietitian II Certified Diabetes Care and Education Specialist Please refer to [facility] for RD and/or RD on-call/weekend/after hours pager

## 2021-04-12 NOTE — Progress Notes (Addendum)
Providing Compassionate, Quality Care - Together   Subjective: Patient is intubated. He is able to nod and shake his head slightly to yes or no questions.  Objective: Vital signs in last 24 hours: Temp:  [93.9 F (34.4 C)-99.1 F (37.3 C)] 97.3 F (36.3 C) (10/03 0900) Pulse Rate:  [42-117] 60 (10/03 0900) Resp:  [7-28] 12 (10/03 0900) BP: (98-131)/(54-82) 113/61 (10/03 0900) SpO2:  [92 %-100 %] 100 % (10/03 0900) Arterial Line BP: (103-155)/(46-71) 120/57 (10/03 0900) FiO2 (%):  [40 %] 40 % (10/03 0751)  Intake/Output from previous day: 10/02 0701 - 10/03 0700 In: 5404.6 [I.V.:4489.8; IV Piggyback:914.8] Out: 3100 [Urine:2250; Emesis/NG output:250; Drains:350; Blood:250] Intake/Output this shift: Total I/O In: 258.3 [I.V.:258.3] Out: 50 [Emesis/NG output:50]  Responds to voice PERRLA Intubated, weaning on PS Wiggles fingers and toes. Nods that he can feel when his extremities are touched. Incisions are covered with Honeycomb dressings and Steri Strips; Dressings is are dry and intact, with a small amount of dried sanguinous drainage. Hemovac with 350 mL out overnight  Lab Results: Recent Labs    04/11/21 0354 04/11/21 0407 04/11/21 0614 04/12/21 0434  WBC 16.9*  --   --  13.7*  HGB 12.5*   < > 10.9* 8.4*  HCT 37.8*   < > 32.0* 25.1*  PLT 230  --   --  198   < > = values in this interval not displayed.   BMET Recent Labs    04/11/21 0354 04/11/21 0407 04/11/21 0614 04/12/21 0434  NA 135 138 139 136  K 3.6 3.6 3.6 3.6  CL 105 104  --  106  CO2 21*  --   --  20*  GLUCOSE 172* 170*  --  215*  BUN 10 10  --  14  CREATININE 0.78 0.60*  --  0.99  CALCIUM 8.4*  --   --  8.1*    Studies/Results: DG Cervical Spine 1 View  Result Date: 04/11/2021 CLINICAL DATA:  Cervical fusion.  Fluoroscopy time 16 seconds. EXAM: DG CERVICAL SPINE - 1 VIEW; DG C-ARM 1-60 MIN-NO REPORT COMPARISON:  04/11/2021, CT and MRI FINDINGS: Interval reduction of the cervical spine.  Limited detail. Remote odontoid screw again noted. There has been posterior fixation of the UPPER cervical spine, at levels C2, C3, and C4. Detail below C4 is limited. Study quality is degraded by patient motion. IMPRESSION: Posterior cervical fusion. Electronically Signed   By: Norva Pavlov M.D.   On: 04/11/2021 14:40   DG Abd 1 View  Result Date: 04/11/2021 CLINICAL DATA:  Evaluate OG tube EXAM: ABDOMEN - 1 VIEW COMPARISON:  None. FINDINGS: The OG tube terminates in the stomach. IMPRESSION: The OG tube terminates in the stomach. Electronically Signed   By: Gerome Sam III M.D.   On: 04/11/2021 10:13   CT HEAD WO CONTRAST  Result Date: 04/11/2021 CLINICAL DATA:  Critical poly trauma.  Fall through ceiling EXAM: CT HEAD WITHOUT CONTRAST CT CERVICAL SPINE WITHOUT CONTRAST TECHNIQUE: Multidetector CT imaging of the head and cervical spine was performed following the standard protocol without intravenous contrast. Multiplanar CT image reconstructions of the cervical spine were also generated. COMPARISON:  None. FINDINGS: CT HEAD FINDINGS Brain: No evidence of acute infarction, hemorrhage, hydrocephalus, extra-axial collection or mass lesion/mass effect. Vascular: No hyperdense vessel or unexpected calcification. Skull: Negative for fracture Sinuses/Orbits: No visible injury Other: Generalized motion artifact CT CERVICAL SPINE FINDINGS Alignment: Traumatic widening and retrolisthesis at C2-3. Retrolisthesis measures 5 mm. Skull base and  vertebrae: Rigid spine consistent with ankylosing spondylitis with fracture at the C2-3 level involving the disc space, left ring of C1, right lateral mass C3, in the right superior and anterior corner of C3. Prior dens fracture repair with screw. Ankylosis continues from C1 into the thoracic spine. Soft tissues and spinal canal: Expected posterior vertebral hemorrhage with anterior displacement of the airway. There is also swelling to the anterior neck, right eccentric  at the level of the strap muscles. No cartilage fracture is noted. Disc levels:  Ankylosis. Upper chest: Reported separately Critical Value/emergent results were called by telephone at the time of interpretation on 04/11/2021 at 4:38 am to provider [facility] , who verbally acknowledged these results. IMPRESSION: 1. Unstable rigid spine fracture at C2-3 with fracture distraction and retrolisthesis causing canal stenosis. 2. Airway displacement by expected prevertebral hematoma. There is also anterior neck soft contusion, right eccentric, at the level of the strap muscles. 3. No evidence of intracranial injury. Electronically Signed   By: Tiburcio Pea M.D.   On: 04/11/2021 04:43   CT CHEST W CONTRAST  Addendum Date: 04/11/2021   ADDENDUM REPORT: 04/11/2021 05:20 ADDENDUM: Omitted listing of transverse process fractures which involve L2 and L3 bilaterally, and L4 on the left. Electronically Signed   By: Tiburcio Pea M.D.   On: 04/11/2021 05:20   Result Date: 04/11/2021 CLINICAL DATA:  Critical poly trauma. EXAM: CT CHEST, ABDOMEN, AND PELVIS WITH CONTRAST TECHNIQUE: Multidetector CT imaging of the chest, abdomen and pelvis was performed following the standard protocol during bolus administration of intravenous contrast. CONTRAST:  OMNIPAQUE IOHEXOL 300 MG/ML  SOLN COMPARISON:  None. FINDINGS: CT CHEST FINDINGS Cardiovascular: Normal heart size. No pericardial effusion. No evidence of great vessel injury Mediastinum/Nodes: No hematoma or pneumomediastinum Lungs/Pleura: Minimal ground-glass density in the dependent right lung attributed atelectasis. Secretions in the right bronchus intermedius. No hemothorax or pneumothorax. Musculoskeletal: Diffuse posterior element ankylosis. Superimposed bulky spurring at the open T5-6 level, with severe foraminal impingement and cord impingement from behind. Similar finding seen at T11-12. CT ABDOMEN PELVIS FINDINGS Hepatobiliary: No hepatic injury or  perihepatic hematoma. Gallbladder is unremarkable. Pancreas: Negative Spleen: No splenic injury or perisplenic hematoma. Adrenals/Urinary Tract: Slight thickening at the left adrenal gland but not convincing for hemorrhage. No evidence of renal injury. Small bilateral renal calculi and cystic densities. Stomach/Bowel: No evidence of injury. Vascular/Lymphatic: Fat stranding in the retroperitoneum about the aorta in the hepatic cava at the level of the hiatus to the IMA takeoff, attributed to the spine fracture. No active hemorrhage or discrete vessel injury seen. Reproductive: Negative Other: No ascites or pneumoperitoneum Musculoskeletal: L1-2 fracture dislocation traversing the disc space, L1 inferior endplate, and fused posterior elements. There is anterolisthesis and fracture widening with severe spinal stenosis and probable canal hemorrhage. Definite thecal sac involvement. Hypertrophic posterior elements especially at L3-4 with spinal stenosis. Superior endplate fractures with sclerosis at T10 and L1, nonacute. Rigid spine, sacroiliac ankylosis, and right hip osteoarthritis. IMPRESSION: 1. Rigid spine fracture with marked displacement and spinal stenosis at L1-2. 2. Retroperitoneal hemorrhage due to #1. 3. No evidence of visceral injury. 4. Incidental findings noted above. Electronically Signed: By: Tiburcio Pea M.D. On: 04/11/2021 04:52   CT CERVICAL SPINE WO CONTRAST  Result Date: 04/11/2021 CLINICAL DATA:  Critical poly trauma.  Fall through ceiling EXAM: CT HEAD WITHOUT CONTRAST CT CERVICAL SPINE WITHOUT CONTRAST TECHNIQUE: Multidetector CT imaging of the head and cervical spine was performed following the standard protocol without intravenous contrast. Multiplanar  CT image reconstructions of the cervical spine were also generated. COMPARISON:  None. FINDINGS: CT HEAD FINDINGS Brain: No evidence of acute infarction, hemorrhage, hydrocephalus, extra-axial collection or mass lesion/mass effect.  Vascular: No hyperdense vessel or unexpected calcification. Skull: Negative for fracture Sinuses/Orbits: No visible injury Other: Generalized motion artifact CT CERVICAL SPINE FINDINGS Alignment: Traumatic widening and retrolisthesis at C2-3. Retrolisthesis measures 5 mm. Skull base and vertebrae: Rigid spine consistent with ankylosing spondylitis with fracture at the C2-3 level involving the disc space, left ring of C1, right lateral mass C3, in the right superior and anterior corner of C3. Prior dens fracture repair with screw. Ankylosis continues from C1 into the thoracic spine. Soft tissues and spinal canal: Expected posterior vertebral hemorrhage with anterior displacement of the airway. There is also swelling to the anterior neck, right eccentric at the level of the strap muscles. No cartilage fracture is noted. Disc levels:  Ankylosis. Upper chest: Reported separately Critical Value/emergent results were called by telephone at the time of interpretation on 04/11/2021 at 4:38 am to provider [facility] , who verbally acknowledged these results. IMPRESSION: 1. Unstable rigid spine fracture at C2-3 with fracture distraction and retrolisthesis causing canal stenosis. 2. Airway displacement by expected prevertebral hematoma. There is also anterior neck soft contusion, right eccentric, at the level of the strap muscles. 3. No evidence of intracranial injury. Electronically Signed   By: Tiburcio Pea M.D.   On: 04/11/2021 04:43   MR CERVICAL SPINE WO CONTRAST  Result Date: 04/11/2021 CLINICAL DATA:  Cervical spine fracture characterization. EXAM: MRI CERVICAL SPINE WITHOUT CONTRAST TECHNIQUE: Multiplanar, multisequence MR imaging of the cervical spine was performed. No intravenous contrast was administered. COMPARISON:  Cervical spine CT from earlier today FINDINGS: Alignment: Traumatic retrolisthesis at C2-3 measuring 7 mm Vertebrae: Known fracture through a rigid spine at the level of C2-3 with posterior  displacement. The posterior longitudinal ligament appears up lifted from the posterior C3 body with subjacent hemorrhage extending into the disc space. No occult fracture is noted. Cord: Cord compression and mild central cord edema at the level of C2-3 due to posttraumatic deformity. Minimal cord hemorrhage on gradient imaging. Posterior Fossa, vertebral arteries, paraspinal tissues: Prevertebral hematoma. Paraspinal musculature strain worse on the left. The ligamentum flavum is not consistently demonstrated at C2-3. No flow seen within the left vertebral artery. No visible ischemic changes in the covered posterior fossa. Disc levels: No degenerative impingement. A call has been placed to trauma service concerning the left vertebral artery. IMPRESSION: 1. Rigid spine fracture at C2-3 with retrolisthesis causing cord compression. Cord contusion with milder than expected edema and hemorrhage in the central cord. 2. No detected flow in the left vertebral artery, although it was enhancing on prior CT of the chest (which covered to the level of C2). Transverse foramina are distorted by fracture and malpositioning, consider CTA follow-up. Electronically Signed   By: Tiburcio Pea M.D.   On: 04/11/2021 08:52   MR LUMBAR SPINE WO CONTRAST  Result Date: 04/11/2021 CLINICAL DATA:  Fall from attic. EXAM: MRI LUMBAR SPINE WITHOUT CONTRAST TECHNIQUE: Multiplanar, multisequence MR imaging of the lumbar spine was performed. No intravenous contrast was administered. COMPARISON:  None. FINDINGS: Segmentation:  5 lumbar type vertebrae Alignment: Traumatic distraction of a L1-2 fracture with anterolisthesis. Vertebrae: Rigid spine with L1-2 fracture at the superior endplate/disc level of L2 extending through the fused posterior elements. Displacement causes severe thecal sac narrowing exacerbated by epidural blood at the level of L1 and L2. Multiple transverse  process fractures as described on dedicated study. Conus medullaris  and cauda equina: Conus extends to the L1-2 level. No detected conus edema. Paraspinal and other soft tissues: Retroperitoneal hemorrhage. Extensive paraspinal and psoas muscular strain. Distended urinary bladder. Disc levels: No degenerative impingement IMPRESSION: 1. Rigid spine fracture with distraction and listhesis at L1-2 causing severe spinal stenosis that is exacerbated by regional epidural hemorrhage. 2. Retroperitoneal hemorrhage and extensive strain. 3. Distended urinary bladder, question retention. 4. Known transverse process fractures. Electronically Signed   By: Tiburcio Pea M.D.   On: 04/11/2021 08:57   CT ABDOMEN PELVIS W CONTRAST  Addendum Date: 04/11/2021   ADDENDUM REPORT: 04/11/2021 05:20 ADDENDUM: Omitted listing of transverse process fractures which involve L2 and L3 bilaterally, and L4 on the left. Electronically Signed   By: Tiburcio Pea M.D.   On: 04/11/2021 05:20   Result Date: 04/11/2021 CLINICAL DATA:  Critical poly trauma. EXAM: CT CHEST, ABDOMEN, AND PELVIS WITH CONTRAST TECHNIQUE: Multidetector CT imaging of the chest, abdomen and pelvis was performed following the standard protocol during bolus administration of intravenous contrast. CONTRAST:  OMNIPAQUE IOHEXOL 300 MG/ML  SOLN COMPARISON:  None. FINDINGS: CT CHEST FINDINGS Cardiovascular: Normal heart size. No pericardial effusion. No evidence of great vessel injury Mediastinum/Nodes: No hematoma or pneumomediastinum Lungs/Pleura: Minimal ground-glass density in the dependent right lung attributed atelectasis. Secretions in the right bronchus intermedius. No hemothorax or pneumothorax. Musculoskeletal: Diffuse posterior element ankylosis. Superimposed bulky spurring at the open T5-6 level, with severe foraminal impingement and cord impingement from behind. Similar finding seen at T11-12. CT ABDOMEN PELVIS FINDINGS Hepatobiliary: No hepatic injury or perihepatic hematoma. Gallbladder is unremarkable. Pancreas:  Negative Spleen: No splenic injury or perisplenic hematoma. Adrenals/Urinary Tract: Slight thickening at the left adrenal gland but not convincing for hemorrhage. No evidence of renal injury. Small bilateral renal calculi and cystic densities. Stomach/Bowel: No evidence of injury. Vascular/Lymphatic: Fat stranding in the retroperitoneum about the aorta in the hepatic cava at the level of the hiatus to the IMA takeoff, attributed to the spine fracture. No active hemorrhage or discrete vessel injury seen. Reproductive: Negative Other: No ascites or pneumoperitoneum Musculoskeletal: L1-2 fracture dislocation traversing the disc space, L1 inferior endplate, and fused posterior elements. There is anterolisthesis and fracture widening with severe spinal stenosis and probable canal hemorrhage. Definite thecal sac involvement. Hypertrophic posterior elements especially at L3-4 with spinal stenosis. Superior endplate fractures with sclerosis at T10 and L1, nonacute. Rigid spine, sacroiliac ankylosis, and right hip osteoarthritis. IMPRESSION: 1. Rigid spine fracture with marked displacement and spinal stenosis at L1-2. 2. Retroperitoneal hemorrhage due to #1. 3. No evidence of visceral injury. 4. Incidental findings noted above. Electronically Signed: By: Tiburcio Pea M.D. On: 04/11/2021 04:52   DG Lumbar Spine 1 View  Result Date: 04/11/2021 CLINICAL DATA:  PLIF lumbar EXAM: LUMBAR SPINE - 1 VIEW COMPARISON:  Lumbar spine MRI 04/11/2021. FINDINGS: Three intraoperative fluoroscopic views were obtained during posterior lumbar fusion hardware placement. Fluoroscopy time 76 seconds. IMPRESSION: Intraoperative fluoroscopy as above. Electronically Signed   By: Darliss Cheney M.D.   On: 04/11/2021 16:53   DG Pelvis Portable  Result Date: 04/11/2021 CLINICAL DATA:  Critical poly trauma. EXAM: PORTABLE PELVIS 1 VIEWS COMPARISON:  None. FINDINGS: Sacroiliac ankylosis with spurring. No acute fracture or subluxation involving  the pelvis. Degenerative spurring at the right hip. IMPRESSION: 1. No acute finding in the pelvis. 2. Bilateral sacroiliac ankylosis. Electronically Signed   By: Tiburcio Pea M.D.   On: 04/11/2021 04:25  DG CHEST PORT 1 VIEW  Result Date: 04/12/2021 CLINICAL DATA:  Respiratory failure, vomiting EXAM: PORTABLE CHEST 1 VIEW COMPARISON:  Portable exam 0521 hours compared to 04/11/2021 FINDINGS: Tip of endotracheal tube projects 3.8 cm above carina. Nasogastric tube extends into stomach. RIGHT subclavian line with tip projecting over SVC. Normal heart size, mediastinal contours, and pulmonary vascularity. Lungs clear. No pleural effusion or pneumothorax. Prior thoracolumbar fusion. IMPRESSION: No acute abnormalities. Electronically Signed   By: Ulyses Southward M.D.   On: 04/12/2021 08:28   DG Chest Portable 1 View  Result Date: 04/11/2021 CLINICAL DATA:  Central line placement. EXAM: PORTABLE CHEST 1 VIEW COMPARISON:  April 11, 2021 FINDINGS: A new right central line terminates in the central SVC. No pneumothorax. The ETT is in good position. The NG tube terminates below today's film. The cardiomediastinal silhouette is normal. No pulmonary nodules or masses. No focal infiltrates. The lateral aspect of the left lung base was not included on this study. IMPRESSION: 1. The new right central line terminates in the central SVC. No pneumothorax. 2. Other support apparatus as above. 3. Left midclavicular fracture. 4. Deformity of the right humeral head has a nonacute appearance. Electronically Signed   By: Gerome Sam III M.D.   On: 04/11/2021 05:44   DG Chest Portable 1 View  Result Date: 04/11/2021 CLINICAL DATA:  Intubation EXAM: PORTABLE CHEST 1 VIEW COMPARISON:  Earlier the same day FINDINGS: New endotracheal tube with tip just below the clavicular heads. The enteric tube reaches the stomach. The lungs remain clear. Normal heart size and mediastinal contours. Left mid clavicle fracture. IMPRESSION: 1.  Unremarkable hardware. 2. Left mid clavicle fracture. Electronically Signed   By: Tiburcio Pea M.D.   On: 04/11/2021 05:23   DG Chest Port 1 View  Result Date: 04/11/2021 CLINICAL DATA:  Level 2 trauma due to fall. EXAM: PORTABLE CHEST 1 VIEW COMPARISON:  None. FINDINGS: Low volume rotated chest. There is no edema, consolidation, effusion, or pneumothorax. No detected fracture IMPRESSION: Negative rotated chest radiograph. Electronically Signed   By: Tiburcio Pea M.D.   On: 04/11/2021 04:23   DG C-Arm 1-60 Min-No Report  Result Date: 04/11/2021 CLINICAL DATA:  Cervical fusion.  Fluoroscopy time 16 seconds. EXAM: DG CERVICAL SPINE - 1 VIEW; DG C-ARM 1-60 MIN-NO REPORT COMPARISON:  04/11/2021, CT and MRI FINDINGS: Interval reduction of the cervical spine. Limited detail. Remote odontoid screw again noted. There has been posterior fixation of the UPPER cervical spine, at levels C2, C3, and C4. Detail below C4 is limited. Study quality is degraded by patient motion. IMPRESSION: Posterior cervical fusion. Electronically Signed   By: Norva Pavlov M.D.   On: 04/11/2021 14:40   DG C-Arm 1-60 Min-No Report  Result Date: 04/11/2021 Fluoroscopy was utilized by the requesting physician.  No radiographic interpretation.    Assessment/Plan: Patient sustained a chance type fracture at C2-3 with marked angulation and severe spinal canal stenosis and a severe fracture dislocation with distraction at L1-L2 following a fall from about 10'. Dr. Jordan Likes performed a C2-C3-C4 decompressive laminectomy, followed by a C2-3-4 posterior lateral fusion utilizing lateral mass instrumentation. The patient is moving his hands and feet and appears to have sensation. Trauma service is managing ventilator.   LOS: 1 day   -Continue to hold Imuran for now. -Extubation per Trauma team -CTA neck planned for this morning -Maintain Hemovac for now. Likely discontinue tomorrow.  Val Eagle, DNP, AGNP-C Nurse  Practitioner  [facility] [address], [place], [place] [address] P: [phone]    F: [phone]  04/12/2021, 10:00 AM

## 2021-04-12 NOTE — Progress Notes (Addendum)
Patient ID: Julian Allen, male   DOB: 1980/10/08, 40 y.o.   MRN: [id number] Follow up - Trauma Critical Care  Patient Details:    Julian Allen is an 40 y.o. male.  Lines/tubes : Airway 7.5 mm (Active)  Secured at (cm) 25 cm 04/12/21 0751  Measured From Lips 04/12/21 0751  Secured Location Center 04/12/21 0751  Secured By [employer] 04/12/21 0751  Tube Holder Repositioned Yes 04/12/21 0751  Prone position No 04/12/21 0751  Cuff Pressure (cm H2O) Green OR 18-26 [facility] 04/12/21 0751  Site Condition Dry 04/12/21 0751     CVC Triple Lumen 04/11/21 Right Subclavian (Active)  Indication for Insertion or Continuance of Line Vasoactive infusions 04/11/21 2000  Site Assessment Clean;Dry;Intact 04/11/21 2000  Proximal Lumen Status Flushed;Infusing 04/11/21 2000  Medial Lumen Status Flushed;Infusing 04/11/21 2000  Distal Lumen Status Flushed;Infusing 04/11/21 2000  Dressing Type Transparent 04/11/21 2000  Dressing Status Clean;Dry;Intact 04/11/21 2000  Antimicrobial disc in place? Yes 04/11/21 2000  Line Care Connections checked and tightened 04/11/21 2000  Dressing Change Due 04/18/21 04/11/21 2000     Arterial Line 04/11/21 Left Radial (Active)  Site Assessment Clean;Dry;Intact 04/11/21 2000  Line Status Pulsatile blood flow 04/11/21 2000  Art Line Waveform Appropriate 04/11/21 2000  Art Line Interventions Zeroed and calibrated;Leveled;Connections checked and tightened 04/11/21 2000  Color/Movement/Sensation Capillary refill less than 3 sec;Cool fingers/toes 04/11/21 2000  Dressing Type Transparent 04/11/21 2000  Dressing Status Clean;Dry;Intact;Antimicrobial disc in place 04/11/21 2000  Dressing Change Due 04/18/21 04/11/21 2000     Closed System Drain 1 Posterior Back Accordion (Hemovac) 10 Fr. (Active)  Site Description Unremarkable 04/11/21 2000  Dressing Status Dry;Intact;Old drainage 04/11/21 2000  Drainage Appearance Bloody 04/11/21 2000  Status To suction  (Charged) 04/11/21 2000  Output (mL) 100 mL 04/12/21 0600     NG/OG Vented/Dual Lumen 16 Fr. Oral (Active)  Tube Position (Required) External length of tube 04/11/21 2000  Measurement (cm) (Required) 55 cm 04/11/21 2000  Site Assessment Clean;Dry;Intact;Tape intact 04/11/21 2000  Interventions Clamped 04/11/21 2000  Status Low intermittent suction 04/12/21 0400  Drainage Appearance Bloody;Brown 04/12/21 0400  Output (mL) 250 mL 04/12/21 0600     Urethral Catheter Emogene Morgan RN 16 Fr. (Active)  Indication for Insertion or Continuance of Catheter Unstable critically ill patients first 24-48 hours (See Criteria) 04/12/21 0723  Site Assessment Clean;Intact;Dry 04/12/21 0723  Catheter Maintenance Bag below level of bladder;Catheter secured;Drainage bag/tubing not touching floor;No dependent loops 04/12/21 0723  Collection Container Standard drainage bag 04/12/21 0723  Securement Method Securing device (Describe) 04/12/21 0723  Urinary Catheter Interventions (if applicable) Unclamped 04/12/21 0723  Output (mL) 75 mL 04/12/21 0600    Microbiology/Sepsis markers: Results for orders placed or performed during the hospital encounter of 04/11/21  Resp Panel by RT-PCR (Flu A&B, Covid) Nasopharyngeal Swab     Status: None   Collection Time: 04/11/21  4:00 AM   Specimen: Nasopharyngeal Swab; Nasopharyngeal(NP) swabs in vial transport medium  Result Value Ref Range Status   SARS Coronavirus 2 by RT PCR NEGATIVE NEGATIVE Final    Comment: (NOTE) SARS-CoV-2 target nucleic acids are NOT DETECTED.  The SARS-CoV-2 RNA is generally detectable in upper respiratory specimens during the acute phase of infection. The lowest concentration of SARS-CoV-2 viral copies this assay can detect is 138 copies/mL. A negative result does not preclude SARS-Cov-2 infection and should not be used as the sole basis for treatment or other patient management decisions. A negative result may occur with  improper  specimen collection/handling, submission of specimen other than nasopharyngeal swab, presence of viral mutation(s) within the areas targeted by this assay, and inadequate number of viral copies(<138 copies/mL). A negative result must be combined with clinical observations, patient history, and epidemiological information. The expected result is Negative.  Fact Sheet for Patients:  BloggerCourse.com  Fact Sheet for Healthcare Providers:  SeriousBroker.it  This test is no t yet approved or cleared by the [place] FDA and  has been authorized for detection and/or diagnosis of SARS-CoV-2 by FDA under an Emergency Use Authorization (EUA). This EUA will remain  in effect (meaning this test can be used) for the duration of the COVID-19 declaration under Section 564(b)(1) of the Act, 21 U.S.C.section 360bbb-3(b)(1), unless the authorization is terminated  or revoked sooner.       Influenza A by PCR NEGATIVE NEGATIVE Final   Influenza B by PCR NEGATIVE NEGATIVE Final    Comment: (NOTE) The Xpert Xpress SARS-CoV-2/FLU/RSV plus assay is intended as an aid in the diagnosis of influenza from Nasopharyngeal swab specimens and should not be used as a sole basis for treatment. Nasal washings and aspirates are unacceptable for Xpert Xpress SARS-CoV-2/FLU/RSV testing.  Fact Sheet for Patients: BloggerCourse.com  Fact Sheet for Healthcare Providers: SeriousBroker.it  This test is not yet approved or cleared by the [place] FDA and has been authorized for detection and/or diagnosis of SARS-CoV-2 by FDA under an Emergency Use Authorization (EUA). This EUA will remain in effect (meaning this test can be used) for the duration of the COVID-19 declaration under Section 564(b)(1) of the Act, 21 U.S.C. section 360bbb-3(b)(1), unless the authorization is terminated or revoked.  Performed at  [facility], [address] [place], [place] [address]   MRSA Next Gen by PCR, Nasal     Status: None   Collection Time: 04/11/21  9:35 AM   Specimen: Nasal Mucosa; Nasal Swab  Result Value Ref Range Status   MRSA by PCR Next Gen NOT DETECTED NOT DETECTED Final    Comment: (NOTE) The GeneXpert MRSA Assay (FDA approved for NASAL specimens only), is one component of a comprehensive MRSA colonization surveillance program. It is not intended to diagnose MRSA infection nor to guide or monitor treatment for MRSA infections. Test performance is not FDA approved in patients less than 61 years old. Performed at [facility], [address] [place], [place] [address]     Anti-infectives:  Anti-infectives (From admission, onward)    Start     Dose/Rate Route Frequency Ordered Stop   04/11/21 1800  ceFAZolin (ANCEF) IVPB 2g/100 mL premix        2 g 200 mL/hr over 30 Minutes Intravenous Every 8 hours 04/11/21 1659     04/11/21 1659  ceFAZolin (ANCEF) IVPB 2g/100 mL premix  Status:  Discontinued        2 g 200 mL/hr over 30 Minutes Intravenous 30 min pre-op 04/11/21 1700 04/11/21 1715   04/11/21 1255  vancomycin (VANCOCIN) powder  Status:  Discontinued          As needed 04/11/21 1310 04/11/21 1535       Best Practice/Protocols:  VTE Prophylaxis: Mechanical Continous Sedation  Consults: Treatment Team:  Julio Sicks, MD    Studies:    Events:  Subjective:    Overnight Issues:   Objective:  Vital signs for last 24 hours: Temp:  [93.9 F (34.4 C)-99.1 F (37.3 C)] 97.3 F (36.3 C) (10/03 0735) Pulse Rate:  [  42-117] 61 (10/03 0751) Resp:  [13-28] 19 (10/03 0751) BP: (91-175)/(54-95) 115/54 (10/03 0751) SpO2:  [92 %-100 %] 100 % (10/03 0751) Arterial Line BP: (103-155)/(46-71) 116/56 (10/03 0735) FiO2 (%):  [40 %] 40 % (10/03 0751)  Hemodynamic parameters for last 24 hours:    Intake/Output from previous day: 10/02 0701 - 10/03 0700 In: 5404.6  [I.V.:4489.8; IV Piggyback:914.8] Out: 3100 [Urine:2250; Emesis/NG output:250; Drains:350; Blood:250]  Intake/Output this shift: No intake/output data recorded.  Vent settings for last 24 hours: Vent Mode: PSV;CPAP FiO2 (%):  [40 %] 40 % Set Rate:  [18 bmp] 18 bmp Vt Set:  [540 mL] 540 mL PEEP:  [5 cmH20] 5 cmH20 Pressure Support:  [10 cmH20] 10 cmH20 Plateau Pressure:  [13 cmH20-14 cmH20] 14 cmH20  Physical Exam:  General: on vent Neuro: awake on vent, does move L hand, R fingers, B toes, ?LTS HEENT/Neck: collar Resp: clear to auscultation bilaterally CVS: RRR 60s GI: soft, NT Extremities: calves soft  Results for orders placed or performed during the hospital encounter of 04/11/21 (from the past 24 hour(s))  MRSA Next Gen by PCR, Nasal     Status: None   Collection Time: 04/11/21  9:35 AM   Specimen: Nasal Mucosa; Nasal Swab  Result Value Ref Range   MRSA by PCR Next Gen NOT DETECTED NOT DETECTED  Prepare RBC (crossmatch)     Status: None   Collection Time: 04/11/21  9:43 AM  Result Value Ref Range   Order Confirmation      BB SAMPLE OR UNITS ALREADY AVAILABLE Performed at [facility], [address], [place], [place] [address]   Prepare RBC (crossmatch)     Status: None   Collection Time: 04/11/21  9:44 AM  Result Value Ref Range   Order Confirmation      ORDER PROCESSED BY BLOOD BANK Performed at [facility], [address] [place], [place] [address]   ABO/Rh     Status: None   Collection Time: 04/11/21  9:52 AM  Result Value Ref Range   ABO/RH(D)      O POS Performed at [facility], [address] [place], [place] [address]   CBC     Status: Abnormal   Collection Time: 04/12/21  4:34 AM  Result Value Ref Range   WBC 13.7 (H) 4.0 - 10.5 K/uL   RBC 2.60 (L) 4.22 - 5.81 MIL/uL   Hemoglobin 8.4 (L) 13.0 - 17.0 g/dL   HCT 92.1 (L) 19.4 - 17.4 %   MCV 96.5 80.0 - 100.0 fL   MCH 32.3 26.0 - 34.0 pg   MCHC 33.5 30.0 - 36.0 g/dL   RDW  08.1 44.8 - 18.5 %   Platelets 198 150 - 400 K/uL   nRBC 0.0 0.0 - 0.2 %  Basic metabolic panel     Status: Abnormal   Collection Time: 04/12/21  4:34 AM  Result Value Ref Range   Sodium 136 135 - 145 mmol/L   Potassium 3.6 3.5 - 5.1 mmol/L   Chloride 106 98 - 111 mmol/L   CO2 20 (L) 22 - 32 mmol/L   Glucose, Bld 215 (H) 70 - 99 mg/dL   BUN 14 6 - 20 mg/dL   Creatinine, Ser 6.31 0.61 - 1.24 mg/dL   Calcium 8.1 (L) 8.9 - 10.3 mg/dL   GFR, Estimated >49 >70 mL/min   Anion gap 10 5 - 15    Assessment & Plan: Present on Admission:  Fall, initial encounter    LOS: 1 day   Additional comments:I reviewed the patient's new clinical lab test results. 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 Jordan Likes VTE - PAS, LMWH when OK with Dr. Jordan Likes FEN - vomited, no TF today, meds per tube, IVF Dispo - ICU, vent, CT angio neck  Critical Care Total Time*: 44 Minutes  Violeta Gelinas, MD, MPH, FACS Trauma & General Surgery Use [email or web address] to contact on call provider  04/12/2021  *Care during the described time interval was provided by me. I have reviewed this patient's available data, including medical history, events of note, physical examination and test results as part of my evaluation.

## 2021-04-12 NOTE — Progress Notes (Addendum)
eLink Physician-Brief Progress Note Patient Name: Julian Allen DOB: 1981/05/14 MRN: [id number]   Date of Service  04/12/2021  HPI/Events of Note  Notified that patient vomited while getting a bath.  OGT has been attached to LIWS.  Camera assessment, BP 111/67, HR 67, RR 20, O2 sats 99%.  Pt appears comfortable, not in distress.  eICU Interventions  Get CXR.       Intervention Category Minor Interventions: Other:  Larinda Buttery 04/12/2021, 4:50 AM

## 2021-04-13 ENCOUNTER — Encounter (HOSPITAL_COMMUNITY): Payer: Self-pay | Admitting: Neurosurgery

## 2021-04-13 ENCOUNTER — Ambulatory Visit: Admit: 2021-04-13 | Payer: PRIVATE HEALTH INSURANCE

## 2021-04-13 LAB — CBC
HCT: 18.5 % — ABNORMAL LOW (ref 39.0–52.0)
Hemoglobin: 6.3 g/dL — CL (ref 13.0–17.0)
MCH: 33.2 pg (ref 26.0–34.0)
MCHC: 34.1 g/dL (ref 30.0–36.0)
MCV: 97.4 fL (ref 80.0–100.0)
Platelets: 121 10*3/uL — ABNORMAL LOW (ref 150–400)
RBC: 1.9 MIL/uL — ABNORMAL LOW (ref 4.22–5.81)
RDW: 15.2 % (ref 11.5–15.5)
WBC: 6.3 10*3/uL (ref 4.0–10.5)
nRBC: 0 % (ref 0.0–0.2)

## 2021-04-13 LAB — GLUCOSE, CAPILLARY
Glucose-Capillary: 107 mg/dL — ABNORMAL HIGH (ref 70–99)
Glucose-Capillary: 110 mg/dL — ABNORMAL HIGH (ref 70–99)
Glucose-Capillary: 113 mg/dL — ABNORMAL HIGH (ref 70–99)
Glucose-Capillary: 114 mg/dL — ABNORMAL HIGH (ref 70–99)
Glucose-Capillary: 119 mg/dL — ABNORMAL HIGH (ref 70–99)
Glucose-Capillary: 125 mg/dL — ABNORMAL HIGH (ref 70–99)

## 2021-04-13 LAB — HEMOGLOBIN A1C
Hgb A1c MFr Bld: 5.4 % (ref 4.8–5.6)
Mean Plasma Glucose: 108.28 mg/dL

## 2021-04-13 LAB — BASIC METABOLIC PANEL
Anion gap: 6 (ref 5–15)
BUN: 11 mg/dL (ref 6–20)
CO2: 25 mmol/L (ref 22–32)
Calcium: 7.8 mg/dL — ABNORMAL LOW (ref 8.9–10.3)
Chloride: 104 mmol/L (ref 98–111)
Creatinine, Ser: 0.7 mg/dL (ref 0.61–1.24)
GFR, Estimated: >60 mL/min (ref >60)
Glucose, Bld: 106 mg/dL — ABNORMAL HIGH (ref 70–99)
Potassium: 3.6 mmol/L (ref 3.5–5.1)
Sodium: 135 mmol/L (ref 135–145)

## 2021-04-13 LAB — PREPARE RBC (CROSSMATCH)

## 2021-04-13 LAB — HEMOGLOBIN AND HEMATOCRIT, BLOOD
HCT: 21.5 % — ABNORMAL LOW (ref 39.0–52.0)
Hemoglobin: 7.3 g/dL — ABNORMAL LOW (ref 13.0–17.0)

## 2021-04-13 MED ORDER — SODIUM CHLORIDE 0.9 % IV SOLN: INTRAVENOUS | Status: DC

## 2021-04-13 MED ORDER — SODIUM CHLORIDE 0.9% IV SOLUTION: Freq: Once | INTRAVENOUS | Status: AC

## 2021-04-13 MED ORDER — POTASSIUM CHLORIDE IN NACL 20-0.9 MEQ/L-% IV SOLN
INTRAVENOUS | Status: DC
  Filled 2021-04-13 (×7): qty 1000

## 2021-04-13 MED ORDER — CHLORHEXIDINE GLUCONATE CLOTH 2 % EX PADS
6.0000 | MEDICATED_PAD | Freq: Every day | CUTANEOUS | Status: DC
  Administered 2021-04-14 – 2021-04-27 (×13): 6 via TOPICAL

## 2021-04-13 NOTE — Progress Notes (Signed)
  Test: Hgb Critical Value: 6.3  Name of Provider Notified: Dr. Andrey Campanile  Orders Received? Or Actions Taken?: Transfuse 1 unit PRBC

## 2021-04-13 NOTE — Evaluation (Signed)
Occupational Therapy Evaluation Patient Details Name: Julian Allen MRN: [id number] DOB: [birth year] Today's Date: 04/13/2021   History of Present Illness 40 yo male fall through ceiling landing on exercise equipment. intubated 10/2 C2-3 fx with canal stenosis with cord compression and prevertebral hematoma L1-2 fx dislocation 10/2 PLIF C2-4 L1-2 Decompression laminectomy with reduction of fx T12 L1 L2 L3 PLA with pedicle screw fixation and local autografting, PMH seizure, crohns disease   Clinical Impression   Patient is s/p cervical and lumbar surgeries with cord compression noted resulting in functional limitations due to the deficits listed below (see OT problem list). Pt currently requires total +2 max (A) to total (A) for all aspects of care.pt with all extremities moving during session. Pt noted pins and needles sensation to bil UE. Pt could benefit from prafo for R LE and sip/ puff call bell once extubated. RN checking on sip and puff call bell to place on bed at this time. Next session recommend oob with hoyer to chair with attempts to sit<>Stand as pt can tolerate. BP management with SCD this session but could benefit from ted hose thigh high as progressing.  Patient will benefit from skilled OT acutely to increase independence and safety with ADLS to allow discharge CIR.  Has father mentioned as possible (A) - not present this session to confirm. Pt with head nods so difficult to gather more information.       Recommendations for follow up therapy are one component of a multi-disciplinary discharge planning process, led by the attending physician.  Recommendations may be updated based on patient status, additional functional criteria and insurance authorization.   Follow Up Recommendations  CIR    Equipment Recommendations  3 in 1 bedside commode;Wheelchair (measurements OT);Wheelchair cushion (measurements OT);Hospital bed (sip and puff call bell)    Recommendations for Other  Services Rehab consult     Precautions / Restrictions Precautions Precautions: Fall;Cervical;Back Precaution Comments: vent support cpap mode, Aline, Foley, NG tube      Mobility Bed Mobility Overal bed mobility: Needs Assistance Bed Mobility: Rolling;Supine to Sit;Sit to Supine Rolling: +2 for physical assistance;Total assist   Supine to sit: +2 for physical assistance;Max assist Sit to supine: +2 for physical assistance;Total assist   General bed mobility comments: pt requires (A) of therapist and pad to progress toward EOB static sitting. pt activating core at EOB to sit up and going into full extension. pt given cue to stop and pt immediately responds. pt attempting to follow commands but noted to have accessory recruitment. pt when returning supine with student watching vent lines and x2 therapist returning to side lying the supine position with total (A)    Transfers Overall transfer level: Needs assistance Equipment used: 2 person hand held assist Transfers: Sit to/from Stand Sit to Stand: +2 physical assistance;Max assist;From elevated surface         General transfer comment: pt initiates task but unable to sustain. SCD on bil LE for BP management. pt bil LE blocked with R LE buckle noted.    Balance Overall balance assessment: Needs assistance Sitting-balance support: No upper extremity supported;Feet supported Sitting balance-Leahy Scale: Zero Sitting balance - Comments: strong posterior lean when core engaged and needs faciliation for upright posture Postural control: Posterior lean                                 ADL either performed or assessed with clinical judgement  ADL Overall ADL's : Needs assistance/impaired Eating/Feeding: NPO   Grooming: Maximal assistance   Upper Body Bathing: Total assistance   Lower Body Bathing: Total assistance   Upper Body Dressing : Total assistance   Lower Body Dressing: Total assistance     Toilet  Transfer Details (indicate cue type and reason): attempted sit<>Stand and will need squat pivot or maxi move next session           General ADL Comments: pt progressed from supine to sitting eob on vent support CPAP tolerating well with SCD for BP control on bil LE     Vision Ability to See in Adequate Light: 0 Adequate Additional Comments: vision difficult to fully assess but appears intact without deficits noted     Perception     Praxis      Pertinent Vitals/Pain Pain Assessment: Faces Faces Pain Scale: Hurts little more Pain Location: legs. back when sitting Pain Descriptors / Indicators: Grimacing Pain Intervention(s): Monitored during session;Premedicated before session;Repositioned     Hand Dominance Right   Extremity/Trunk Assessment Upper Extremity Assessment Upper Extremity Assessment: RUE deficits/detail;LUE deficits/detail RUE Deficits / Details: AROM should elbow wrist and digts supine. pt AROM shoulder abduction 45 degrees against bed surface and shoulder flexion 20 degrees but unable to sustain. Pt grasp but difficulty with isolated digit movement. pt able to flicker thumb, able to show initiation of 2 digits but unable to show. pt unable to adduct elbow to body at this time for switch access. Requesting oral switch for extubation RUE Sensation: decreased light touch;decreased proprioception (reports pins needles denies burning) RUE Coordination: decreased gross motor LUE Deficits / Details: AROM should elbow wrist and digts supine. pt AROM shoulder abduction 45 degrees against bed surface and shoulder flexion 20 degrees but unable to sustain. Pt grasp of closed all digits on command.  pt unable to adduct elbow to body at this time for switch access. Requesting oral switch for extubation LUE Sensation: decreased light touch;decreased proprioception (denies burning but reports pins and needles) LUE Coordination: decreased fine motor;decreased gross motor   Lower  Extremity Assessment Lower Extremity Assessment: Defer to PT evaluation;RLE deficits/detail;LLE deficits/detail RLE Deficits / Details: weaker than L LE. pt attempting to adduct hip but with weakness and demonstrates abduction. pt with quad activation but unable to sustain LLE Deficits / Details: pt able to lift off bed surface with knee extension. pt able to push away and pull toward core with (A). LLE stronger than R LE   Cervical / Trunk Assessment Cervical / Trunk Assessment: Other exceptions Cervical / Trunk Exceptions: s/p surg in ccollar. skin inspected this session with no signs of break down   Communication Communication Communication: Other (comment) (orally intubated)   Cognition Arousal/Alertness: Awake/alert Behavior During Therapy: WFL for tasks assessed/performed Overall Cognitive Status: Within Functional Limits for tasks assessed                                 General Comments: pt nodding head yes and no to questions. pt at times attempting to mouth around intubation. pt able to make click sound around vent when asked. Pt given this sound and function as a way to communicate immediate needs to staff. pt able to return demo   General Comments  CPAP vent 40% peep 5    Exercises Exercises: Other exercises Other Exercises Other Exercises: scapula retraction , scapula elevation, digit flexion / extension, forearm supination/ pronation, elbow  flexion, hip flexion and extension/ abduction adduction knee flexion/ extension, ankle flexion   Shoulder Instructions      Home Living Family/patient expects to be discharged to:: Private residence                                 Additional Comments: unable to report at this time but will gather more information next session      Prior Functioning/Environment Level of Independence: Independent                 OT Problem List: Decreased strength;Decreased range of motion;Decreased activity  tolerance;Impaired balance (sitting and/or standing);Decreased coordination;Decreased cognition;Decreased safety awareness;Decreased knowledge of use of DME or AE;Decreased knowledge of precautions;Impaired sensation;Impaired UE functional use      OT Treatment/Interventions: Self-care/ADL training;Therapeutic exercise;Neuromuscular education;Energy conservation;DME and/or AE instruction;Manual therapy;Modalities;Therapeutic activities;Visual/perceptual remediation/compensation;Patient/family education;Balance training    OT Goals(Current goals can be found in the care plan section) Acute Rehab OT Goals Patient Stated Goal: none stated OT Goal Formulation: With patient Time For Goal Achievement: 04/27/21 Potential to Achieve Goals: Good  OT Frequency: Min 3X/week   Barriers to D/C: Other (comment) (unknown at this time)          Co-evaluation PT/OT/SLP Co-Evaluation/Treatment: Yes Reason for Co-Treatment: Necessary to address cognition/behavior during functional activity;Complexity of the patient's impairments (multi-system involvement);For patient/therapist safety;To address functional/ADL transfers   OT goals addressed during session: ADL's and self-care;Proper use of Adaptive equipment and DME;Strengthening/ROM      AM-PAC OT "6 Clicks" Daily Activity     Outcome Measure Help from another person eating meals?: A Lot Help from another person taking care of personal grooming?: A Lot Help from another person toileting, which includes using toliet, bedpan, or urinal?: A Lot Help from another person bathing (including washing, rinsing, drying)?: A Lot Help from another person to put on and taking off regular upper body clothing?: A Lot Help from another person to put on and taking off regular lower body clothing?: Total 6 Click Score: 11   End of Session Equipment Utilized During Treatment: Oxygen Nurse Communication: Mobility status;Precautions;Need for lift equipment  Activity  Tolerance: Patient tolerated treatment well Patient left: in bed;with call bell/phone within reach;with chair alarm set;with SCD's reapplied  OT Visit Diagnosis: Unsteadiness on feet (R26.81);Muscle weakness (generalized) (M62.81);Ataxia, unspecified (R27.0);Other abnormalities of gait and mobility (R26.89)                Time: 1478-2956 OT Time Calculation (min): 40 min Charges:  OT General Charges $OT Visit: 1 Visit OT Evaluation $OT Eval High Complexity: 1 High OT Treatments $Self Care/Home Management : 8-22 mins   Brynn, OTR/L  Acute Rehabilitation Services Pager: [phone] Office: [phone] .   Mateo Flow 04/13/2021, 1:06 PM

## 2021-04-13 NOTE — Progress Notes (Addendum)
Providing Compassionate, Quality Care - Together   Subjective: Patient is intubated. He communicates that his back is hurting. The nurse is getting ready to give pain medication.  Objective: Vital signs in last 24 hours: Temp:  [96.8 F (36 C)-99.9 F (37.7 C)] 98.8 F (37.1 C) (10/04 0800) Pulse Rate:  [45-92] 70 (10/04 0808) Resp:  [0-32] 23 (10/04 0808) BP: (92-145)/(48-74) 105/50 (10/04 0808) SpO2:  [92 %-100 %] 93 % (10/04 0808) Arterial Line BP: (87-159)/(41-75) 101/49 (10/04 0800) FiO2 (%):  [40 %-100 %] 40 % (10/04 0808)  Intake/Output from previous day: 10/03 0701 - 10/04 0700 In: 3388.3 [I.V.:2866.1; Blood:30; IV Piggyback:492.3] Out: 2220 [Urine:1700; Emesis/NG output:400; Drains:120] Intake/Output this shift: Total I/O In: 110.4 [I.V.:110.4] Out: -   Responds to voice PERRLA Intubated, weaning on PS Wiggles fingers and toes. Able to raise left leg off the bed. Nods that he can feel when his extremities are touched. Incisions are covered with Honeycomb dressings and Steri Strips; Dressings are dry and intact, with a small amount of dried sanguinous drainage. Hemovac with 120 mL out over last 24 hours  Lab Results: Recent Labs    04/12/21 0434 04/13/21 0540  WBC 13.7* 6.3  HGB 8.4* 6.3*  HCT 25.1* 18.5*  PLT 198 121*   BMET Recent Labs    04/12/21 0434 04/13/21 0540  NA 136 135  K 3.6 3.6  CL 106 104  CO2 20* 25  GLUCOSE 215* 106*  BUN 14 11  CREATININE 0.99 0.70  CALCIUM 8.1* 7.8*    Studies/Results: DG Cervical Spine 1 View  Result Date: 04/11/2021 CLINICAL DATA:  Cervical fusion.  Fluoroscopy time 16 seconds. EXAM: DG CERVICAL SPINE - 1 VIEW; DG C-ARM 1-60 MIN-NO REPORT COMPARISON:  04/11/2021, CT and MRI FINDINGS: Interval reduction of the cervical spine. Limited detail. Remote odontoid screw again noted. There has been posterior fixation of the UPPER cervical spine, at levels C2, C3, and C4. Detail below C4 is limited. Study quality  is degraded by patient motion. IMPRESSION: Posterior cervical fusion. Electronically Signed   By: Norva Pavlov M.D.   On: 04/11/2021 14:40   DG Abd 1 View  Result Date: 04/11/2021 CLINICAL DATA:  Evaluate OG tube EXAM: ABDOMEN - 1 VIEW COMPARISON:  None. FINDINGS: The OG tube terminates in the stomach. IMPRESSION: The OG tube terminates in the stomach. Electronically Signed   By: Gerome Sam III M.D.   On: 04/11/2021 10:13   CT ANGIO NECK W OR WO CONTRAST  Result Date: 04/12/2021 CLINICAL DATA:  Neck trauma, arterial injury suspected. EXAM: CT ANGIOGRAPHY NECK TECHNIQUE: Multidetector CT imaging of the neck was performed using the standard protocol during bolus administration of intravenous contrast. Multiplanar CT image reconstructions and MIPs were obtained to evaluate the vascular anatomy. Carotid stenosis measurements (when applicable) are obtained utilizing NASCET criteria, using the distal internal carotid diameter as the denominator. CONTRAST:  75mL OMNIPAQUE IOHEXOL 350 MG/ML SOLN COMPARISON:  Cervical spine CT 04/11/2021. Cervical spine MRI 04/11/2021. FINDINGS: Aortic arch: The visualized aortic arch is unremarkable. The origins of the innominate and left common carotid arteries are excluded from the field of view. Right carotid system: CCA and ICA patent within the neck without stenosis or evidence of traumatic vascular injury. Left carotid system: The origin of the common carotid artery is excluded from the field of view. Within this limitation, the common carotid and internal carotid arteries are patent within the neck without stenosis or evidence of traumatic vascular injury. Vertebral arteries:  Vertebral arteries patent within the neck without significant stenosis or evidence of traumatic vascular injury. The right vertebral artery is dominant. The left vertebral artery is duplicated proximally. Skeleton: Sequela of ankylosing spondylitis. Redemonstrated acute fracture at the C2-C3  level, involving the C2-C3 disc space, articular pillars on the left at C2-C3, superior aspect of the C2 vertebral body and right C2 lateral mass. A nondisplaced acute fracture of the left C2 transverse foramen is also again noted. There has been interval surgical fixation. A screw now traverses the C2 vertebral body and dens. A posterior spinal fusion construct now spans the C2-C4 levels. Persistent apex ventral angulation at the level of the C2-C3 fracture. There is otherwise reversal of the expected cervical lordosis. Redemonstrated acute, displaced left clavicular fracture. Other neck: Surgical changes to the dorsal cervical soft tissues at the operative levels. Bilateral soft tissue swelling/hematoma within the neck. Upper chest: No consolidation within the imaged lung apices. Centrilobular emphysema. Small scattered foci of subcutaneous gas within the upper posterior chest wall. Other: The ET tube terminates above the level of the carina. Additional linear device within the upper airway and trachea, possibly reflecting a temperature monitoring probe. Partially imaged enteric tube. IMPRESSION: The origin of the left common carotid artery is excluded from the field of view (as is the origin of the innominate artery). Within this limitation, the common carotid, internal carotid and vertebral arteries are patent within the neck without significant stenosis or evidence of traumatic vascular injury. Redemonstrated C2-C3 fractures, as described. Postoperative changes to the cervical spine, as detailed and new from the cervical spine MRI of 04/11/2021. Persistent apex ventral angulation at the level of the C2-C3. Soft tissue swelling/hematoma within the bilateral neck. Emphysema (ICD10-J43.9). Electronically Signed   By: Jackey Loge D.O.   On: 04/12/2021 11:05   DG Lumbar Spine 1 View  Result Date: 04/11/2021 CLINICAL DATA:  PLIF lumbar EXAM: LUMBAR SPINE - 1 VIEW COMPARISON:  Lumbar spine MRI 04/11/2021.  FINDINGS: Three intraoperative fluoroscopic views were obtained during posterior lumbar fusion hardware placement. Fluoroscopy time 76 seconds. IMPRESSION: Intraoperative fluoroscopy as above. Electronically Signed   By: Darliss Cheney M.D.   On: 04/11/2021 16:53   DG CHEST PORT 1 VIEW  Result Date: 04/12/2021 CLINICAL DATA:  Respiratory failure, vomiting EXAM: PORTABLE CHEST 1 VIEW COMPARISON:  Portable exam 0521 hours compared to 04/11/2021 FINDINGS: Tip of endotracheal tube projects 3.8 cm above carina. Nasogastric tube extends into stomach. RIGHT subclavian line with tip projecting over SVC. Normal heart size, mediastinal contours, and pulmonary vascularity. Lungs clear. No pleural effusion or pneumothorax. Prior thoracolumbar fusion. IMPRESSION: No acute abnormalities. Electronically Signed   By: Ulyses Southward M.D.   On: 04/12/2021 08:28   DG C-Arm 1-60 Min-No Report  Result Date: 04/11/2021 CLINICAL DATA:  Cervical fusion.  Fluoroscopy time 16 seconds. EXAM: DG CERVICAL SPINE - 1 VIEW; DG C-ARM 1-60 MIN-NO REPORT COMPARISON:  04/11/2021, CT and MRI FINDINGS: Interval reduction of the cervical spine. Limited detail. Remote odontoid screw again noted. There has been posterior fixation of the UPPER cervical spine, at levels C2, C3, and C4. Detail below C4 is limited. Study quality is degraded by patient motion. IMPRESSION: Posterior cervical fusion. Electronically Signed   By: Norva Pavlov M.D.   On: 04/11/2021 14:40   DG C-Arm 1-60 Min-No Report  Result Date: 04/11/2021 Fluoroscopy was utilized by the requesting physician.  No radiographic interpretation.    Assessment/Plan: Patient sustained a chance type fracture at C2-3 with marked angulation  and severe spinal canal stenosis and a severe fracture dislocation with distraction at L1-L2 following a fall from about 10'. Dr. Jordan Likes performed a C2-C3-C4 decompressive laminectomy, followed by a C2-3-4 posterior lateral fusion utilizing lateral mass  instrumentation on 04/11/2021. The patient is moving his hands and feet and confirms he has sensation in all extremities. Trauma service is managing ventilator. CTA neck on 04/12/2021 negative.   LOS: 2 days   -Continue to hold Imuran for now. -Extubation per Trauma team -Discontinue Hemovac   Val Eagle, DNP, AGNP-C Nurse Practitioner  [facility] [address], [place], [place] [address] P: [phone]    F: [phone]  04/13/2021, 9:36 AM

## 2021-04-13 NOTE — Evaluation (Signed)
Physical Therapy Evaluation Patient Details Name: Brayan Votaw MRN: [id number] DOB: 10-27-1980 Today's Date: 04/13/2021  History of Present Illness  40 yo male fall through ceiling landing on exercise equipment. intubated 10/2 C2-3 fx with canal stenosis with cord compression and prevertebral hematoma L1-2 fx dislocation 10/2 PLIF C2-4 L1-2 Decompression laminectomy with reduction of fx T12 L1 L2 L3 PLA with pedicle screw fixation and local autografting, PMH seizure, crohns disease  Clinical Impression  Pt admitted with above diagnosis. Pt currently intubated, required max A +2 for all mobility but tolerated sitting EOB as well as partial stand with VSS stable and no distress. Would benefit from B TED hose as well as R PRAFO. Chart mentions pt's father as support system, not present on eval.  Pt currently with functional limitations due to the deficits listed below (see PT Problem List). Pt will benefit from skilled PT to increase their independence and safety with mobility to allow discharge to the venue listed below.          Recommendations for follow up therapy are one component of a multi-disciplinary discharge planning process, led by the attending physician.  Recommendations may be updated based on patient status, additional functional criteria and insurance authorization.  Follow Up Recommendations CIR    Equipment Recommendations  Other (comment) (TBD)    Recommendations for Other Services Rehab consult     Precautions / Restrictions Precautions Precautions: Fall;Cervical;Back Precaution Comments: vent support cpap mode, A line, Foley, NG tube Restrictions Weight Bearing Restrictions: No      Mobility  Bed Mobility Overal bed mobility: Needs Assistance Bed Mobility: Rolling;Supine to Sit;Sit to Supine Rolling: +2 for physical assistance;Total assist   Supine to sit: +2 for physical assistance;Max assist Sit to supine: +2 for physical assistance;Total assist   General  bed mobility comments: pt requires (A) of therapist and pad to progress toward EOB static sitting. pt activating core at EOB to sit up and going into full extension. pt given cue to stop and pt immediately responds. pt attempting to follow commands but noted to have accessory recruitment. pt when returning supine with student watching vent lines and x2 therapist returning to side lying the supine position with total (A)    Transfers Overall transfer level: Needs assistance Equipment used: 2 person hand held assist Transfers: Sit to/from Stand Sit to Stand: +2 physical assistance;Max assist;From elevated surface         General transfer comment: pt initiates task but unable to sustain. SCD on bil LE for BP management. pt bil LE blocked with R LE buckle noted. Pt able to actively anteriorly tilt pelvis for intiation of standing  Ambulation/Gait             General Gait Details: TBD  Stairs            Wheelchair Mobility    Modified Rankin (Stroke Patients Only)       Balance Overall balance assessment: Needs assistance Sitting-balance support: No upper extremity supported;Feet supported Sitting balance-Leahy Scale: Zero Sitting balance - Comments: strong posterior lean when core engaged and needs faciliation for upright posture, posterior pelvic tilt in sitting Postural control: Posterior lean Standing balance support: Bilateral upper extremity supported Standing balance-Leahy Scale: Zero Standing balance comment: max A +2 to maintain partial stand                             Pertinent Vitals/Pain Pain Assessment: Faces Faces Pain Scale: Hurts  little more Pain Location: legs. back when sitting Pain Descriptors / Indicators: Grimacing Pain Intervention(s): Limited activity within patient's tolerance;Monitored during session;RN gave pain meds during session    Home Living Family/patient expects to be discharged to:: Private residence Living  Arrangements: Parent               Additional Comments: unable to report at this time but will gather more information next session    Prior Function Level of Independence: Independent               Hand Dominance   Dominant Hand: Right    Extremity/Trunk Assessment   Upper Extremity Assessment Upper Extremity Assessment: Defer to OT evaluation RUE Deficits / Details: AROM should elbow wrist and digts supine. pt AROM shoulder abduction 45 degrees against bed surface and shoulder flexion 20 degrees but unable to sustain. Pt grasp but difficulty with isolated digit movement. pt able to flicker thumb, able to show initiation of 2 digits but unable to show. pt unable to adduct elbow to body at this time for switch access. Requesting oral switch for extubation RUE Sensation: decreased light touch;decreased proprioception (reports pins needles denies burning) RUE Coordination: decreased gross motor LUE Deficits / Details: AROM should elbow wrist and digts supine. pt AROM shoulder abduction 45 degrees against bed surface and shoulder flexion 20 degrees but unable to sustain. Pt grasp of closed all digits on command.  pt unable to adduct elbow to body at this time for switch access. Requesting oral switch for extubation LUE Sensation: decreased light touch;decreased proprioception (denies burning but reports pins and needles) LUE Coordination: decreased fine motor;decreased gross motor    Lower Extremity Assessment Lower Extremity Assessment: RLE deficits/detail;LLE deficits/detail RLE Deficits / Details: weaker than L LE. pt attempting to adduct hip but with weakness (1/5) and demonstrates abduction. pt with quad activation but unable to sustain. Able to move toes in gravity eliminated position but not ankle. Hip flex 1/5 RLE Sensation: decreased light touch;decreased proprioception RLE Coordination: decreased fine motor;decreased gross motor LLE Deficits / Details: hip flex >3/5,  ankle at least 3/5, knee ext 3/5, able to actively abd/ add at hip. LLE Sensation:  (needs more testing once extubated) LLE Coordination: decreased gross motor    Cervical / Trunk Assessment Cervical / Trunk Assessment: Other exceptions Cervical / Trunk Exceptions: s/p surg in ccollar. skin inspected this session with no signs of break down  Communication   Communication: Other (comment) (orally intubated)  Cognition Arousal/Alertness: Awake/alert Behavior During Therapy: WFL for tasks assessed/performed Overall Cognitive Status: Within Functional Limits for tasks assessed                                 General Comments: pt nodding head yes and no to questions. pt at times attempting to mouth around intubation. pt able to make click sound around vent when asked. Pt given this sound and function as a way to communicate immediate needs to staff. pt able to return demo      General Comments General comments (skin integrity, edema, etc.): CPAP vent 40% peep 5. BP with positive response to sititng    Exercises Other Exercises Other Exercises: scapula retraction , scapula elevation, digit flexion / extension, forearm supination/ pronation, elbow flexion, hip flexion and extension/ abduction adduction knee flexion/ extension, ankle flexion   Assessment/Plan    PT Assessment Patient needs continued PT services  PT Problem List Decreased  strength;Decreased range of motion;Decreased activity tolerance;Decreased balance;Decreased mobility;Decreased coordination;Decreased cognition;Decreased knowledge of use of DME;Decreased safety awareness;Decreased knowledge of precautions;Impaired tone;Cardiopulmonary status limiting activity;Impaired sensation;Pain       PT Treatment Interventions DME instruction;Gait training;Functional mobility training;Therapeutic activities;Therapeutic exercise;Balance training;Neuromuscular re-education;Cognitive remediation;Patient/family education     PT Goals (Current goals can be found in the Care Plan section)  Acute Rehab PT Goals Patient Stated Goal: tube out of throat PT Goal Formulation: With patient Time For Goal Achievement: 04/27/21 Potential to Achieve Goals: Good    Frequency Min 4X/week   Barriers to discharge  (TBD)      Co-evaluation PT/OT/SLP Co-Evaluation/Treatment: Yes Reason for Co-Treatment: Complexity of the patient's impairments (multi-system involvement);Necessary to address cognition/behavior during functional activity;For patient/therapist safety PT goals addressed during session: Mobility/safety with mobility;Balance OT goals addressed during session: ADL's and self-care;Proper use of Adaptive equipment and DME;Strengthening/ROM       AM-PAC PT "6 Clicks" Mobility  Outcome Measure Help needed turning from your back to your side while in a flat bed without using bedrails?: Total Help needed moving from lying on your back to sitting on the side of a flat bed without using bedrails?: Total Help needed moving to and from a bed to a chair (including a wheelchair)?: Total Help needed standing up from a chair using your arms (e.g., wheelchair or bedside chair)?: Total Help needed to walk in hospital room?: Total Help needed climbing 3-5 steps with a railing? : Total 6 Click Score: 6    End of Session Equipment Utilized During Treatment: Oxygen Activity Tolerance: Patient tolerated treatment well Patient left: in bed;with call bell/phone within reach Nurse Communication: Mobility status PT Visit Diagnosis: Unsteadiness on feet (R26.81);Muscle weakness (generalized) (M62.81);Pain;Difficulty in walking, not elsewhere classified (R26.2) Pain - part of body:  (neck/ back)    Time: 1001-1037 PT Time Calculation (min) (ACUTE ONLY): 36 min   Charges:   PT Evaluation $PT Eval Moderate Complexity: 1 Mod          [place], PT  Acute Rehab Services  Pager [phone] Office  [phone]   Lawana Chambers Qaadir Kent 04/13/2021, 1:32 PM

## 2021-04-13 NOTE — Progress Notes (Addendum)
Patient ID: Julian Allen, male   DOB: 12/10/1980, 40 y.o.   MRN: [id number] Follow up - Trauma Critical Care  Patient Details:    Julian Allen is an 40 y.o. male.  Lines/tubes : Airway 7.5 mm (Active)  Secured at (cm) 25 cm 04/13/21 0808  Measured From Lips 04/13/21 0808  Secured Location Center 04/13/21 6767  Secured By [employer] 04/13/21 0808  Tube Holder Repositioned Yes 04/13/21 0808  Prone position No 04/13/21 0808  Cuff Pressure (cm H2O) Green OR 18-26 [facility] 04/13/21 2094  Site Condition Dry 04/13/21 0808     CVC Triple Lumen 04/11/21 Right Subclavian (Active)  Indication for Insertion or Continuance of Line Vasoactive infusions 04/12/21 2000  Site Assessment Clean;Dry;Intact 04/12/21 2000  Proximal Lumen Status Flushed;Infusing 04/12/21 2000  Medial Lumen Status Flushed;Infusing 04/12/21 2000  Distal Lumen Status Flushed;Infusing 04/12/21 2000  Dressing Type Transparent 04/12/21 2000  Dressing Status Clean;Dry;Intact 04/12/21 2000  Antimicrobial disc in place? Yes 04/12/21 2000  Line Care Connections checked and tightened 04/12/21 2000  Dressing Change Due 04/18/21 04/12/21 2000     Arterial Line 04/11/21 Left Radial (Active)  Site Assessment Clean;Dry;Intact 04/12/21 2000  Line Status Pulsatile blood flow 04/12/21 2000  Art Line Waveform Appropriate 04/12/21 2000  Art Line Interventions Zeroed and calibrated;Leveled 04/12/21 2000  Color/Movement/Sensation Cool fingers/toes;Capillary refill less than 3 sec 04/12/21 2000  Dressing Type Transparent 04/12/21 2000  Dressing Status Clean;Dry;Intact;Antimicrobial disc in place 04/12/21 2000  Dressing Change Due 04/18/21 04/12/21 2000     Closed System Drain 1 Posterior Back Accordion (Hemovac) 10 Fr. (Active)  Site Description Unremarkable 04/12/21 2000  Dressing Status Dry;Intact;Old drainage 04/12/21 2000  Drainage Appearance Bloody 04/12/21 2000  Status To suction (Charged) 04/12/21 2000  Output (mL) 0  mL 04/13/21 0529     NG/OG Vented/Dual Lumen 16 Fr. Oral (Active)  Tube Position (Required) Marking at nare/corner of mouth 04/12/21 2000  Measurement (cm) (Required) 55 cm 04/12/21 2000  Site Assessment Clean;Dry;Intact;Tape intact 04/12/21 2000  Interventions Clamped 04/12/21 0800  Status Low intermittent suction 04/12/21 2000  Drainage Appearance Bloody;Brown 04/12/21 2000  Output (mL) 300 mL 04/13/21 0529     Urethral Catheter Emogene Morgan RN 16 Fr. (Active)  Indication for Insertion or Continuance of Catheter Unstable critically ill patients first 24-48 hours (See Criteria) 04/13/21 0747  Site Assessment Clean;Intact;Dry 04/13/21 0747  Catheter Maintenance Bag below level of bladder;Catheter secured;Drainage bag/tubing not touching floor;No dependent loops 04/13/21 0747  Collection Container Standard drainage bag 04/13/21 0747  Securement Method Securing device (Describe) 04/13/21 0747  Urinary Catheter Interventions (if applicable) Unclamped 04/13/21 0747  Output (mL) 125 mL 04/13/21 0529    Microbiology/Sepsis markers: Results for orders placed or performed during the hospital encounter of 04/11/21  Resp Panel by RT-PCR (Flu A&B, Covid) Nasopharyngeal Swab     Status: None   Collection Time: 04/11/21  4:00 AM   Specimen: Nasopharyngeal Swab; Nasopharyngeal(NP) swabs in vial transport medium  Result Value Ref Range Status   SARS Coronavirus 2 by RT PCR NEGATIVE NEGATIVE Final    Comment: (NOTE) SARS-CoV-2 target nucleic acids are NOT DETECTED.  The SARS-CoV-2 RNA is generally detectable in upper respiratory specimens during the acute phase of infection. The lowest concentration of SARS-CoV-2 viral copies this assay can detect is 138 copies/mL. A negative result does not preclude SARS-Cov-2 infection and should not be used as the sole basis for treatment or other patient management decisions. A negative result may occur with  improper specimen  collection/handling,  submission of specimen other than nasopharyngeal swab, presence of viral mutation(s) within the areas targeted by this assay, and inadequate number of viral copies(<138 copies/mL). A negative result must be combined with clinical observations, patient history, and epidemiological information. The expected result is Negative.  Fact Sheet for Patients:  BloggerCourse.com  Fact Sheet for Healthcare Providers:  SeriousBroker.it  This test is no t yet approved or cleared by the [place] FDA and  has been authorized for detection and/or diagnosis of SARS-CoV-2 by FDA under an Emergency Use Authorization (EUA). This EUA will remain  in effect (meaning this test can be used) for the duration of the COVID-19 declaration under Section 564(b)(1) of the Act, 21 U.S.C.section 360bbb-3(b)(1), unless the authorization is terminated  or revoked sooner.       Influenza A by PCR NEGATIVE NEGATIVE Final   Influenza B by PCR NEGATIVE NEGATIVE Final    Comment: (NOTE) The Xpert Xpress SARS-CoV-2/FLU/RSV plus assay is intended as an aid in the diagnosis of influenza from Nasopharyngeal swab specimens and should not be used as a sole basis for treatment. Nasal washings and aspirates are unacceptable for Xpert Xpress SARS-CoV-2/FLU/RSV testing.  Fact Sheet for Patients: BloggerCourse.com  Fact Sheet for Healthcare Providers: SeriousBroker.it  This test is not yet approved or cleared by the [place] FDA and has been authorized for detection and/or diagnosis of SARS-CoV-2 by FDA under an Emergency Use Authorization (EUA). This EUA will remain in effect (meaning this test can be used) for the duration of the COVID-19 declaration under Section 564(b)(1) of the Act, 21 U.S.C. section 360bbb-3(b)(1), unless the authorization is terminated or revoked.  Performed at [facility], [address] [place], [place] [address]   MRSA Next Gen by PCR, Nasal     Status: None   Collection Time: 04/11/21  9:35 AM   Specimen: Nasal Mucosa; Nasal Swab  Result Value Ref Range Status   MRSA by PCR Next Gen NOT DETECTED NOT DETECTED Final    Comment: (NOTE) The GeneXpert MRSA Assay (FDA approved for NASAL specimens only), is one component of a comprehensive MRSA colonization surveillance program. It is not intended to diagnose MRSA infection nor to guide or monitor treatment for MRSA infections. Test performance is not FDA approved in patients less than 27 years old. Performed at [facility], [address], [place], [place] [address]     Anti-infectives:  Anti-infectives (From admission, onward)    Start     Dose/Rate Route Frequency Ordered Stop   04/11/21 1800  ceFAZolin (ANCEF) IVPB 2g/100 mL premix        2 g 200 mL/hr over 30 Minutes Intravenous Every 8 hours 04/11/21 1659     04/11/21 1659  ceFAZolin (ANCEF) IVPB 2g/100 mL premix  Status:  Discontinued        2 g 200 mL/hr over 30 Minutes Intravenous 30 min pre-op 04/11/21 1700 04/11/21 1715   04/11/21 1255  vancomycin (VANCOCIN) powder  Status:  Discontinued          As needed 04/11/21 1310 04/11/21 1535       Best Practice/Protocols:  VTE Prophylaxis: Mechanical Continous Sedation  Consults: Treatment Team:  Julio Sicks, MD    Studies:    Events:  Subjective:    Overnight Issues:   Objective:  Vital signs for last 24 hours: Temp:  [96.8 F (36 C)-99.9 F (37.7 C)] 98.8 F (37.1 C) (10/04 0800) Pulse Rate:  [45-92] 70 (  10/04 4098) Resp:  [0-32] 23 (10/04 1191) BP: (92-145)/(48-74) 105/50 (10/04 0808) SpO2:  [92 %-100 %] 93 % (10/04 0808) Arterial Line BP: (87-159)/(41-75) 101/49 (10/04 0800) FiO2 (%):  [40 %-100 %] 40 % (10/04 0808)  Hemodynamic parameters for last 24 hours:    Intake/Output from previous day: 10/03 0701 - 10/04 0700 In: 3388.3 [I.V.:2866.1; Blood:30; IV  Piggyback:492.3] Out: 2220 [Urine:1700; Emesis/NG output:400; Drains:120]  Intake/Output this shift: Total I/O In: 110.4 [I.V.:110.4] Out: -   Vent settings for last 24 hours: Vent Mode: PSV;CPAP FiO2 (%):  [40 %-100 %] 40 % Set Rate:  [18 bmp] 18 bmp Vt Set:  [540 mL] 540 mL PEEP:  [5 cmH20] 5 cmH20 Pressure Support:  [10 cmH20] 10 cmH20 Plateau Pressure:  [14 cmH20] 14 cmH20  Physical Exam:  General: on vent wean Neuro: awake on vent, improved L biceps, B grip, more mvt LLE and some RLE HEENT/Neck: ETT and colalr Resp: clear to auscultation bilaterally CVS: RRR GI: soft, NT Extremities: min edema  Results for orders placed or performed during the hospital encounter of 04/11/21 (from the past 24 hour(s))  Glucose, capillary     Status: Abnormal   Collection Time: 04/12/21  7:37 PM  Result Value Ref Range   Glucose-Capillary 152 (H) 70 - 99 mg/dL  Glucose, capillary     Status: Abnormal   Collection Time: 04/12/21 11:20 PM  Result Value Ref Range   Glucose-Capillary 126 (H) 70 - 99 mg/dL  Glucose, capillary     Status: Abnormal   Collection Time: 04/13/21  3:19 AM  Result Value Ref Range   Glucose-Capillary 113 (H) 70 - 99 mg/dL  CBC     Status: Abnormal   Collection Time: 04/13/21  5:40 AM  Result Value Ref Range   WBC 6.3 4.0 - 10.5 K/uL   RBC 1.90 (L) 4.22 - 5.81 MIL/uL   Hemoglobin 6.3 (LL) 13.0 - 17.0 g/dL   HCT 47.8 (L) 29.5 - 62.1 %   MCV 97.4 80.0 - 100.0 fL   MCH 33.2 26.0 - 34.0 pg   MCHC 34.1 30.0 - 36.0 g/dL   RDW 30.8 65.7 - 84.6 %   Platelets 121 (L) 150 - 400 K/uL   nRBC 0.0 0.0 - 0.2 %  Basic metabolic panel     Status: Abnormal   Collection Time: 04/13/21  5:40 AM  Result Value Ref Range   Sodium 135 135 - 145 mmol/L   Potassium 3.6 3.5 - 5.1 mmol/L   Chloride 104 98 - 111 mmol/L   CO2 25 22 - 32 mmol/L   Glucose, Bld 106 (H) 70 - 99 mg/dL   BUN 11 6 - 20 mg/dL   Creatinine, Ser 9.62 0.61 - 1.24 mg/dL   Calcium 7.8 (L) 8.9 - 10.3 mg/dL    GFR, Estimated >95 >28 mL/min   Anion gap 6 5 - 15  Hemoglobin A1c     Status: None   Collection Time: 04/13/21  5:40 AM  Result Value Ref Range   Hgb A1c MFr Bld 5.4 4.8 - 5.6 %   Mean Plasma Glucose 108.28 mg/dL  Prepare RBC (crossmatch)     Status: None   Collection Time: 04/13/21  6:15 AM  Result Value Ref Range   Order Confirmation      ORDER PROCESSED BY BLOOD BANK BB SAMPLE OR UNITS ALREADY AVAILABLE Performed at [facility], [address] [place], [place] [address]   Glucose, capillary  Status: Abnormal   Collection Time: 04/13/21  8:02 AM  Result Value Ref Range   Glucose-Capillary 119 (H) 70 - 99 mg/dL    Assessment & Plan: Present on Admission:  Fall, initial encounter    LOS: 2 days   Additional comments:I reviewed the patient's new clinical lab test results. . 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 Jordan Likes 10/2, motor function improving each day, collar, CT angio neck neg L1-2 Chance FX with SCI - S/P decompressive laminectomy and T12-L3 posterior lateral fuison by Dr Jordan Likes 10/2, see above Acute hypoxic ventilator dependent respiratory failure - weaning with good volumes on 10/5, did get tired yesterday PM. Hopefully can extubate later this week. 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 Hold off on TF today. Ankylosing spondylitis - hold home Imuran per NS Crohn's - hold Humira for now, once can start TF will D/W RD VTE - PAS, LMWH when OK with Dr. Jordan Likes FEN - IVF, see above, change IVF to 0.9NS with 20K Dispo - ICU, vent wean I spoke with his father at the bedside. Critical Care Total Time*: 38 Minutes  Violeta Gelinas, MD, MPH, FACS Trauma & General Surgery Use [email or web address] to contact on call provider  04/13/2021  *Care during  the described time interval was provided by me. I have reviewed this patient's available data, including medical history, events of note, physical examination and test results as part of my evaluation.

## 2021-04-13 NOTE — Progress Notes (Addendum)
  Inpatient Rehabilitation Admissions Coordinator  Inpatient rehab screen per therapy recommendations. Patient has non participating BCBS of [facility]. [facility] is out of network.,Other rehab venues need to be explored. I will notify Raynelle Fanning, RN CM for Trauma.  Ottie Glazier, RN, MSN Rehab Admissions Coordinator [phone] 04/13/2021 2:26 PM

## 2021-04-14 LAB — CBC
HCT: 20.9 % — ABNORMAL LOW (ref 39.0–52.0)
HCT: 25.2 % — ABNORMAL LOW (ref 39.0–52.0)
Hemoglobin: 6.9 g/dL — CL (ref 13.0–17.0)
Hemoglobin: 8.7 g/dL — ABNORMAL LOW (ref 13.0–17.0)
MCH: 31.2 pg (ref 26.0–34.0)
MCH: 32 pg (ref 26.0–34.0)
MCHC: 33 g/dL (ref 30.0–36.0)
MCHC: 34.5 g/dL (ref 30.0–36.0)
MCV: 94.6 fL (ref 80.0–100.0)
MCV: 92.6 fL (ref 80.0–100.0)
Platelets: 120 10*3/uL — ABNORMAL LOW (ref 150–400)
Platelets: 133 10*3/uL — ABNORMAL LOW (ref 150–400)
RBC: 2.21 MIL/uL — ABNORMAL LOW (ref 4.22–5.81)
RBC: 2.72 MIL/uL — ABNORMAL LOW (ref 4.22–5.81)
RDW: 16.1 % — ABNORMAL HIGH (ref 11.5–15.5)
RDW: 16 % — ABNORMAL HIGH (ref 11.5–15.5)
WBC: 6.8 10*3/uL (ref 4.0–10.5)
WBC: 7.8 10*3/uL (ref 4.0–10.5)
nRBC: 0 % (ref 0.0–0.2)
nRBC: 0 % (ref 0.0–0.2)

## 2021-04-14 LAB — PREPARE RBC (CROSSMATCH)

## 2021-04-14 LAB — BASIC METABOLIC PANEL
Anion gap: 9 (ref 5–15)
BUN: 14 mg/dL (ref 6–20)
CO2: 22 mmol/L (ref 22–32)
Calcium: 8 mg/dL — ABNORMAL LOW (ref 8.9–10.3)
Chloride: 105 mmol/L (ref 98–111)
Creatinine, Ser: 0.66 mg/dL (ref 0.61–1.24)
GFR, Estimated: >60 mL/min (ref >60)
Glucose, Bld: 114 mg/dL — ABNORMAL HIGH (ref 70–99)
Potassium: 3.7 mmol/L (ref 3.5–5.1)
Sodium: 136 mmol/L (ref 135–145)

## 2021-04-14 LAB — GLUCOSE, CAPILLARY
Glucose-Capillary: 115 mg/dL — ABNORMAL HIGH (ref 70–99)
Glucose-Capillary: 119 mg/dL — ABNORMAL HIGH (ref 70–99)
Glucose-Capillary: 108 mg/dL — ABNORMAL HIGH (ref 70–99)
Glucose-Capillary: 115 mg/dL — ABNORMAL HIGH (ref 70–99)
Glucose-Capillary: 102 mg/dL — ABNORMAL HIGH (ref 70–99)

## 2021-04-14 MED ORDER — SODIUM CHLORIDE 0.9% IV SOLUTION: Freq: Once | INTRAVENOUS | Status: AC

## 2021-04-14 MED ORDER — VITAL HIGH PROTEIN PO LIQD: 1000.0000 mL | ORAL | Status: DC

## 2021-04-14 MED ORDER — OXYCODONE HCL 5 MG/5ML PO SOLN: 5.0000 mg | ORAL | Status: DC | PRN

## 2021-04-14 MED ORDER — BUSPIRONE HCL 5 MG PO TABS
15.0000 mg | ORAL_TABLET | Freq: Three times a day (TID) | ORAL | Status: DC
  Administered 2021-04-14 – 2021-04-27 (×37): 15 mg via ORAL
  Filled 2021-04-14 (×3): qty 3
  Filled 2021-04-14: qty 1
  Filled 2021-04-14 (×3): qty 3
  Filled 2021-04-14: qty 1
  Filled 2021-04-14 (×3): qty 3
  Filled 2021-04-14: qty 1
  Filled 2021-04-14 (×3): qty 3
  Filled 2021-04-14: qty 1
  Filled 2021-04-14 (×2): qty 3
  Filled 2021-04-14: qty 1
  Filled 2021-04-14: qty 3
  Filled 2021-04-14 (×3): qty 1
  Filled 2021-04-14 (×13): qty 3
  Filled 2021-04-14: qty 1
  Filled 2021-04-14 (×2): qty 3
  Filled 2021-04-14 (×2): qty 1
  Filled 2021-04-14: qty 3

## 2021-04-14 MED ORDER — OXYCODONE HCL 5 MG PO TABS
5.0000 mg | ORAL_TABLET | ORAL | Status: DC | PRN
Start: 2021-04-14 — End: 2021-04-27
  Administered 2021-04-14 – 2021-04-24 (×18): 5 mg via ORAL
  Filled 2021-04-14 (×19): qty 1

## 2021-04-14 MED ORDER — ACETAMINOPHEN 325 MG PO TABS
650.0000 mg | ORAL_TABLET | Freq: Four times a day (QID) | ORAL | Status: DC
  Administered 2021-04-14 – 2021-04-27 (×47): 650 mg via ORAL
  Filled 2021-04-14 (×47): qty 2

## 2021-04-14 MED ORDER — DOCUSATE SODIUM 100 MG PO CAPS
100.0000 mg | ORAL_CAPSULE | Freq: Two times a day (BID) | ORAL | Status: DC
  Administered 2021-04-17: 100 mg via ORAL
  Filled 2021-04-14 (×4): qty 1

## 2021-04-14 MED ORDER — ONDANSETRON HCL 4 MG PO TABS: 4.0000 mg | ORAL_TABLET | Freq: Four times a day (QID) | ORAL | Status: DC | PRN

## 2021-04-14 MED ORDER — PROSOURCE TF PO LIQD
45.0000 mL | Freq: Two times a day (BID) | ORAL | Status: DC
  Administered 2021-04-14: 45 mL
  Filled 2021-04-14: qty 45

## 2021-04-14 MED ORDER — METHOCARBAMOL 500 MG PO TABS
1000.0000 mg | ORAL_TABLET | Freq: Three times a day (TID) | ORAL | Status: DC
Start: 2021-04-14 — End: 2021-04-27
  Administered 2021-04-14 – 2021-04-27 (×36): 1000 mg via ORAL
  Filled 2021-04-14 (×38): qty 2

## 2021-04-14 MED ORDER — ENOXAPARIN SODIUM 30 MG/0.3ML IJ SOSY
30.0000 mg | PREFILLED_SYRINGE | Freq: Two times a day (BID) | INTRAMUSCULAR | Status: DC
  Administered 2021-04-15 – 2021-04-27 (×25): 30 mg via SUBCUTANEOUS
  Filled 2021-04-14 (×25): qty 0.3

## 2021-04-14 MED ORDER — METHOCARBAMOL 500 MG PO TABS
1000.0000 mg | ORAL_TABLET | Freq: Three times a day (TID) | ORAL | Status: DC
  Administered 2021-04-14: 1000 mg
  Filled 2021-04-14: qty 2

## 2021-04-14 MED ORDER — ACETAMINOPHEN 160 MG/5ML PO SOLN
650.0000 mg | Freq: Four times a day (QID) | ORAL | Status: DC
  Administered 2021-04-14: 650 mg
  Filled 2021-04-14: qty 20.3

## 2021-04-14 MED ORDER — POLYETHYLENE GLYCOL 3350 17 G PO PACK: 17.0000 g | PACK | Freq: Every day | ORAL | Status: DC

## 2021-04-14 MED ORDER — ONDANSETRON HCL 4 MG/2ML IJ SOLN: 4.0000 mg | Freq: Four times a day (QID) | INTRAMUSCULAR | Status: DC | PRN

## 2021-04-14 MED ORDER — OXYCODONE HCL 5 MG PO TABS
10.0000 mg | ORAL_TABLET | ORAL | Status: DC | PRN
  Administered 2021-04-16 – 2021-04-27 (×22): 10 mg via ORAL
  Filled 2021-04-14 (×23): qty 2

## 2021-04-14 MED ORDER — BUSPIRONE HCL 15 MG PO TABS: 15.0000 mg | ORAL_TABLET | Freq: Three times a day (TID) | ORAL | Status: DC

## 2021-04-14 NOTE — Evaluation (Signed)
Clinical/Bedside Swallow Evaluation Patient Details  Name: Julian Allen MRN: [id number] Date of Birth: [birth year]  Today's Date: 04/14/2021 Time: SLP Start Time (ACUTE ONLY): 1325 SLP Stop Time (ACUTE ONLY): 1346 SLP Time Calculation (min) (ACUTE ONLY): 21 min  Past Medical History: No past medical history on file. Past Surgical History:  HPI:  40 yo male fall through ceiling landing on exercise equipment. intubated 10/2-10/5 C2-3 fx with canal stenosis with cord compression and prevertebral hematoma L1-2 fx dislocation 10/2 PLIF C2-4 L1-2 Decompression laminectomy with reduction of fx T12 L1 L2 L3 PLA with pedicle screw fixation and local autografting, PMH seizure, crohns disease, pt reported ACDF surgery "after another fall years ago".    Assessment / Plan / Recommendation  Clinical Impression  Risk factors for dysphagia include extubation 2 1/2 hours prior to assessment and cervical laminectomy and posterior fusion. Pt's vocal quality and intensity is adequate. His oral-motor exam was unremarkable. Airway closure suspected inadequte given consistent immediate cough and throat clear with thin (cup and straw) not present with pudding. Given additional time post extubation is recommended prior to allowing consistent po's. He could have dysfunction from due to fusion. Recommend allowing meds crushed in po's and occasional ice chip with ST to plan to return tomorrow for initiation of meals trays or instrumental assessment. SLP Visit Diagnosis: Dysphagia, unspecified (R13.10)    Aspiration Risk  Mild aspiration risk;Moderate aspiration risk    Diet Recommendation NPO except meds;Other (Comment) (crush in applesauce)   Medication Administration: Crushed with puree Supervision: Staff to assist with self feeding Postural Changes: Seated upright at 90 degrees    Other  Recommendations Oral Care Recommendations: Oral care BID    Recommendations for follow up therapy are one component of a  multi-disciplinary discharge planning process, led by the attending physician.  Recommendations may be updated based on patient status, additional functional criteria and insurance authorization.  Follow up Recommendations Inpatient Rehab      Frequency and Duration min 2x/week  2 weeks       Prognosis Prognosis for Safe Diet Advancement: Good      Swallow Study   General Date of Onset: 04/11/21 HPI: 40 yo male fall through ceiling landing on exercise equipment. intubated 10/2-10/5 C2-3 fx with canal stenosis with cord compression and prevertebral hematoma L1-2 fx dislocation 10/2 PLIF C2-4 L1-2 Decompression laminectomy with reduction of fx T12 L1 L2 L3 PLA with pedicle screw fixation and local autografting, PMH seizure, crohns disease, pt reported ACDF surgery "after another fall years ago". Type of Study: Bedside Swallow Evaluation Previous Swallow Assessment:  (none) Diet Prior to this Study: NPO Temperature Spikes Noted: No Respiratory Status: Nasal cannula History of Recent Intubation: Yes Length of Intubations (days):  (3 1/2) Date extubated: 04/14/21 Behavior/Cognition: Cooperative;Alert;Pleasant mood Oral Cavity Assessment: Within Functional Limits Oral Care Completed by SLP: No Oral Cavity - Dentition: Adequate natural dentition Vision: Functional for self-feeding Self-Feeding Abilities: Needs assist Patient Positioning: Upright in bed Baseline Vocal Quality: Normal Volitional Cough: Strong Volitional Swallow: Able to elicit    Oral/Motor/Sensory Function Overall Oral Motor/Sensory Function: Within functional limits   Ice Chips Ice chips: Within functional limits   Thin Liquid Thin Liquid: Impaired Presentation: Cup;Straw Pharyngeal  Phase Impairments: Throat Clearing - Immediate;Cough - Immediate;Throat Clearing - Delayed    Nectar Thick Nectar Thick Liquid: Not tested   Honey Thick Honey Thick Liquid: Not tested   Puree Puree: Impaired Presentation:  Spoon Pharyngeal Phase Impairments: Multiple swallows (no s/s aspiration)   Solid  Solid: Not tested      Royce Macadamia 04/14/2021,2:50 PM   Breck Coons Lonell Face.Ed Nurse, children's [phone] Office [phone]

## 2021-04-14 NOTE — Progress Notes (Signed)
Trauma/Critical Care Follow Up Note  Subjective:    Overnight Issues:   Objective:  Vital signs for last 24 hours: Temp:  [97.7 F (36.5 C)-99 F (37.2 C)] 99 F (37.2 C) (10/05 0811) Pulse Rate:  [54-88] 62 (10/05 0811) Resp:  [16-27] 18 (10/05 0811) BP: (98-153)/(56-89) 136/74 (10/05 0811) SpO2:  [89 %-100 %] 98 % (10/05 0811) Arterial Line BP: (91-165)/(52-80) 154/77 (10/05 0811) FiO2 (%):  [40 %] 40 % (10/05 0741)  Hemodynamic parameters for last 24 hours:    Intake/Output from previous day: 10/04 0701 - 10/05 0700 In: 3481.2 [I.V.:2566.5; Blood:315; IV Piggyback:599.7] Out: 2150 [Urine:1775; Emesis/NG output:375]  Intake/Output this shift: Total I/O In: 93.3 [I.V.:93.3] Out: 175 [Urine:175]  Vent settings for last 24 hours: Vent Mode: PSV;CPAP FiO2 (%):  [40 %] 40 % Set Rate:  [18 bmp] 18 bmp Vt Set:  [540 mL] 540 mL PEEP:  [5 cmH20] 5 cmH20 Pressure Support:  [10 cmH20] 10 cmH20 Plateau Pressure:  [14 cmH20-20 cmH20] 15 cmH20  Physical Exam:  Gen: comfortable, no distress Neuro: non-focal exam, f/c, weaker on R than L HEENT: PERRL Neck: c-collar CV: RRR Pulm: unlabored breathing Abd: soft, NT GU: clear yellow urine, foley Extr: wwp, no edema   Results for orders placed or performed during the hospital encounter of 04/11/21 (from the past 24 hour(s))  Glucose, capillary     Status: Abnormal   Collection Time: 04/13/21 11:48 AM  Result Value Ref Range   Glucose-Capillary 110 (H) 70 - 99 mg/dL  Glucose, capillary     Status: Abnormal   Collection Time: 04/13/21  3:11 PM  Result Value Ref Range   Glucose-Capillary 107 (H) 70 - 99 mg/dL  Glucose, capillary     Status: Abnormal   Collection Time: 04/13/21  7:45 PM  Result Value Ref Range   Glucose-Capillary 125 (H) 70 - 99 mg/dL  Hemoglobin and hematocrit, blood     Status: Abnormal   Collection Time: 04/13/21  9:00 PM  Result Value Ref Range   Hemoglobin 7.3 (L) 13.0 - 17.0 g/dL   HCT 14.7 (L)  82.9 - 52.0 %  Glucose, capillary     Status: Abnormal   Collection Time: 04/13/21 11:34 PM  Result Value Ref Range   Glucose-Capillary 114 (H) 70 - 99 mg/dL  CBC     Status: Abnormal   Collection Time: 04/14/21  6:07 AM  Result Value Ref Range   WBC 6.8 4.0 - 10.5 K/uL   RBC 2.21 (L) 4.22 - 5.81 MIL/uL   Hemoglobin 6.9 (LL) 13.0 - 17.0 g/dL   HCT 56.2 (L) 13.0 - 86.5 %   MCV 94.6 80.0 - 100.0 fL   MCH 31.2 26.0 - 34.0 pg   MCHC 33.0 30.0 - 36.0 g/dL   RDW 78.4 (H) 69.6 - 29.5 %   Platelets 120 (L) 150 - 400 K/uL   nRBC 0.0 0.0 - 0.2 %  Basic metabolic panel     Status: Abnormal   Collection Time: 04/14/21  6:07 AM  Result Value Ref Range   Sodium 136 135 - 145 mmol/L   Potassium 3.7 3.5 - 5.1 mmol/L   Chloride 105 98 - 111 mmol/L   CO2 22 22 - 32 mmol/L   Glucose, Bld 114 (H) 70 - 99 mg/dL   BUN 14 6 - 20 mg/dL   Creatinine, Ser 2.84 0.61 - 1.24 mg/dL   Calcium 8.0 (L) 8.9 - 10.3 mg/dL   GFR, Estimated >13 >24  mL/min   Anion gap 9 5 - 15  Prepare RBC (crossmatch)     Status: None   Collection Time: 04/14/21  6:46 AM  Result Value Ref Range   Order Confirmation      ORDER PROCESSED BY BLOOD BANK BB SAMPLE OR UNITS ALREADY AVAILABLE Performed at [facility], [address], [place], [place] [address]   Glucose, capillary     Status: Abnormal   Collection Time: 04/14/21  8:10 AM  Result Value Ref Range   Glucose-Capillary 115 (H) 70 - 99 mg/dL    Assessment & Plan: The plan of care was discussed with the bedside nurse for the day, who is in agreement with this plan (d/c foley, art line, CVC, and likely ETT) and no additional concerns were raised.   Present on Admission:  Fall, initial encounter    LOS: 3 days   Additional comments:I reviewed the patient's new clinical lab test results.   and I reviewed the patients new imaging test results.    Fall through ceiling  C2-3 Chance FX with severe stenosis and SCI - S/P decompressive laminectomy and C2-4  posterior lateral fusion by Dr. Jordan Likes 10/2, motor function improving each day, collar, CT angio neck neg L1-2 Chance FX with SCI - S/P decompressive laminectomy and T12-L3 posterior lateral fuison by Dr Jordan Likes 10/2, see above Acute hypoxic ventilator dependent respiratory failure - weaning with good volumes on 5/5, did get tired 10/3 PM. Hopefully can extubate today. 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 SLP if able to extubate. Ankylosing spondylitis - hold home Imuran per NS Crohn's - hold Humira for now pending extubation Foley - d/c Art line - d/c Subclavian line - d/c VTE - PAS, LMWH when OK with Dr. Jordan Likes FEN - IVF, IVF NS with 20K Dispo - ICU  Critical Care Total Time: 45 minutes  Clinical update provided to patient's father at bedside.   Diamantina Monks, MD Trauma & General Surgery Please use [email or web address] to contact on call provider  04/14/2021  *Care during the described time interval was provided by me. I have reviewed this patient's available data, including medical history, events of note, physical examination and test results as part of my evaluation.

## 2021-04-14 NOTE — Progress Notes (Addendum)
   Providing Compassionate, Quality Care - Together   Subjective: Patient reports neck and back pain reasonably controlled at this time. He tells me he has sensation, but it is decreased. He was extubated at 10:52 this morning.  Objective: Vital signs in last 24 hours: Temp:  [97.7 F (36.5 C)-99.3 F (37.4 C)] 98.8 F (37.1 C) (10/05 1202) Pulse Rate:  [51-88] 53 (10/05 1300) Resp:  [16-27] 21 (10/05 1300) BP: (98-153)/(62-89) 119/63 (10/05 1300) SpO2:  [89 %-100 %] 99 % (10/05 1300) Arterial Line BP: (91-165)/(53-80) 139/67 (10/05 1200) FiO2 (%):  [40 %] 40 % (10/05 0741)  Intake/Output from previous day: 10/04 0701 - 10/05 0700 In: 3481.2 [I.V.:2566.5; Blood:315; IV Piggyback:599.7] Out: 2150 [Urine:1775; Emesis/NG output:375] Intake/Output this shift: Total I/O In: 751.3 [I.V.:291.3; Blood:360; IV Piggyback:100] Out: 305 [Urine:305]  Alert and oriented PERRLA Speech clear, RA Wiggles fingers and toes. Able to raise left leg off the bed. Incisions are covered with Honeycomb dressings and Steri Strips; Dressings are dry and intact, with a small amount of dried sanguinous drainage.  Lab Results: Recent Labs    04/13/21 0540 04/13/21 2100 04/14/21 0607  WBC 6.3  --  6.8  HGB 6.3* 7.3* 6.9*  HCT 18.5* 21.5* 20.9*  PLT 121*  --  120*   BMET Recent Labs    04/13/21 0540 04/14/21 0607  NA 135 136  K 3.6 3.7  CL 104 105  CO2 25 22  GLUCOSE 106* 114*  BUN 11 14  CREATININE 0.70 0.66  CALCIUM 7.8* 8.0*    Studies/Results: No results found.  Assessment/Plan: Patient sustained a chance type fracture at C2-3 with marked angulation and severe spinal canal stenosis and a severe fracture dislocation with distraction at L1-L2 following a fall from about 10'. Dr. Jordan Likes performed a C2-C3-C4 decompressive laminectomy, followed by a C2-3-4 posterior lateral fusion utilizing lateral mass instrumentation on 04/11/2021. The patient is moving his hands and feet and confirms  he has sensation in all extremities. CTA neck on 04/12/2021 negative. Extubated 04/14/2021.   LOS: 3 days   -Can start LMWH on 04/15/2021 -Mobilize with therapies once brace has arrived, CTO brace ordered   Val Eagle, DNP, AGNP-C Nurse Practitioner  [facility] [address], [place], [place] [address] P: [phone]    F: [phone]  04/14/2021, 12:48 PM

## 2021-04-14 NOTE — Progress Notes (Signed)
Cortrak Team Note    Spoke with pt nurse. Holding off on Cortrak placement today due to pt being able to take medicine with pudding. SLP to work with pt and hopeful for diet advancement soon. Cortrak team able to place on Friday if needed.   Kirby Crigler BS, PLDN Clinical Dietitian See [facility] for contact information.

## 2021-04-14 NOTE — Progress Notes (Addendum)
Nutrition Follow-up  DOCUMENTATION CODES:   Not applicable  INTERVENTION:   -RD will follow for diet advancement and add supplements as appropriate  -If cortrak tube is placed:  Initiate Pivot 1.5 @ 65 ml/hr (1560 ml daily)   Tube feeding regimen provides 2340 kcal (98% of needs), 146 grams of protein, and 1173 ml of H2O.    NUTRITION DIAGNOSIS:   Inadequate oral intake related to inability to eat as evidenced by NPO status.  Ongoing  GOAL:   Patient will meet greater than or equal to 90% of their needs  Progressing   MONITOR:   Diet advancement, Labs, Weight trends, Skin, I & O's  REASON FOR ASSESSMENT:   Ventilator    ASSESSMENT:   Julian Allen is an 40 y.o. male whom arrived as level 2 activation s/p fall through ceiling at home and landing on exercise equipment at approximately. Fell approx 10 ft.  He was amnestic to the events. Arrived complaining of back pain. Underwent workup in ER and then was upgraded to level 1 when he was not breathing or moving. Did not loose pulses. Reportedly flaccid paralysis, bag mask initiated and intubated.  10/2- s/p C2-C3-C4 decompressive laminectomy; C2-3-4 posterior lateral fusion utilizing lateral mass instrumentation, local autograft; L1-L2 decompressive laminectomy and reduction of fracture dislocation;  T12 L1-L2-L3 posterior lateral arthrodesis utilizing segmental pedicle screw fixation and local autografting   10/5- extubated, s/p BSE- recommend NPO  Reviewed I/O's: +1.3 L x 24 hours and +6.8 L since admission  UOP: 1.8 L x 24 hours  Per SLP notes, plan to follow-up for potential for instrumental testing tomorrow.   Case discussed with RN; pt is able to take crushed pills in puree, however, no liquids yet (pt currently on IVF). Plan to hold off on cortrak today, as SLP will re-evaluate tomorrow. RN is hopeful that pt will be placed on a PO diet tomorrow.   Medications reviewed and include colace, miralax, 0.9% NaCl  with KCl 20 mEq/L infusion @ 100 ml/hr, and keppra.   Labs reviewed: CBGS: 115-119 (inpatient orders for glycemic control are 0-15 units insulin aspart every 4 hours).    Diet Order:   Diet Order             Diet NPO time specified  Diet effective now                   EDUCATION NEEDS:   Not appropriate for education at this time  Skin:  Skin Assessment: Skin Integrity Issues: Skin Integrity Issues:: Incisions Incisions: closed neck and back  Last BM:  Unknown  Height:   Ht Readings from Last 1 Encounters:  04/11/21 5\' 8"  (1.727 m)    Weight:   Wt Readings from Last 1 Encounters:  04/11/21 75.3 kg    Ideal Body Weight:  70 kg  BMI:  Body mass index is 25.24 kg/m.  Estimated Nutritional Needs:   Kcal:  2400-2600  Protein:  125-150 grams  Fluid:  > 2 L    06/11/21, RD, LDN, CDCES Registered Dietitian II Certified Diabetes Care and Education Specialist Please refer to [facility] for RD and/or RD on-call/weekend/after hours pager

## 2021-04-14 NOTE — Progress Notes (Signed)
PT Cancellation Note  Patient Details Name: Julian Allen MRN: [id number] DOB: January 17, 1981   Cancelled Treatment:    Reason Eval/Treat Not Completed: Other (comment) Waiting for brace to attempt OOB mobility. OT in contact with [employer] rep for consult.   [name] Dan Humphreys PT, DPT Acute Rehabilitation Services Pager [phone] Office [phone]    [name] 04/14/2021, 2:54 PM

## 2021-04-14 NOTE — Progress Notes (Signed)
Pt's hgb this morning 6.9. Thompson notified, verbal order to give 1 unit PRBC

## 2021-04-14 NOTE — Progress Notes (Signed)
OT NOTE  OT attempting to see patient when brace order placed minutes before starting. OT calling [employer] rep Scott to notify prior to 3pm so that brace can be ordered. Brace will prevent eob and OOB attempts at this time. OT to check back as appropriate.   Timmothy Euler, OTR/L  Acute Rehabilitation Services Pager: [phone] Office: [phone] .

## 2021-04-14 NOTE — Progress Notes (Signed)
Orthopedic Tech Progress Note Patient Details:  Julian Allen [birth year] [id number]  Called in order to [employer] for a CTO BRACE.  Patient ID: Julian Allen, male   DOB: [birth year], 40 y.o.   MRN: [id number]  Julian Allen 04/14/2021, 1:51 PM

## 2021-04-14 NOTE — Procedures (Signed)
Extubation Procedure Note  Patient Details:   Name: Julian Allen DOB: Mar 26, 1981 MRN: [id number]   Airway Documentation:    Vent end date: 04/14/21 Vent end time: 1052   Evaluation  O2 sats: stable throughout Complications: No apparent complications Patient did tolerate procedure well. Bilateral Breath Sounds: Clear, Diminished   Yes  Pt extubated per MD order with RN at bedside. Extubated to 2L [place]. Positive cuff leak noted, no stridor heard. Rt will continue to monitor.   Memory Argue 04/14/2021, 11:01 AM

## 2021-04-15 LAB — BPAM RBC
Blood Product Expiration Date: 202210292359
Blood Product Expiration Date: 202211022359
ISSUE DATE / TIME: 202210040634
ISSUE DATE / TIME: 202210050742
Unit Type and Rh: 5100
Unit Type and Rh: 5100

## 2021-04-15 LAB — TYPE AND SCREEN
ABO/RH(D): O POS
Antibody Screen: NEGATIVE
Unit division: 0
Unit division: 0

## 2021-04-15 LAB — CBC
HCT: 27.8 % — ABNORMAL LOW (ref 39.0–52.0)
Hemoglobin: 9.4 g/dL — ABNORMAL LOW (ref 13.0–17.0)
MCH: 31.1 pg (ref 26.0–34.0)
MCHC: 33.8 g/dL (ref 30.0–36.0)
MCV: 92.1 fL (ref 80.0–100.0)
Platelets: 155 10*3/uL (ref 150–400)
RBC: 3.02 MIL/uL — ABNORMAL LOW (ref 4.22–5.81)
RDW: 15.9 % — ABNORMAL HIGH (ref 11.5–15.5)
WBC: 9.3 10*3/uL (ref 4.0–10.5)
nRBC: 0 % (ref 0.0–0.2)

## 2021-04-15 LAB — BASIC METABOLIC PANEL
Anion gap: 8 (ref 5–15)
BUN: 14 mg/dL (ref 6–20)
CO2: 24 mmol/L (ref 22–32)
Calcium: 8.1 mg/dL — ABNORMAL LOW (ref 8.9–10.3)
Chloride: 108 mmol/L (ref 98–111)
Creatinine, Ser: 0.6 mg/dL — ABNORMAL LOW (ref 0.61–1.24)
GFR, Estimated: >60 mL/min (ref >60)
Glucose, Bld: 106 mg/dL — ABNORMAL HIGH (ref 70–99)
Potassium: 3.3 mmol/L — ABNORMAL LOW (ref 3.5–5.1)
Sodium: 140 mmol/L (ref 135–145)

## 2021-04-15 LAB — GLUCOSE, CAPILLARY
Glucose-Capillary: 84 mg/dL (ref 70–99)
Glucose-Capillary: 90 mg/dL (ref 70–99)
Glucose-Capillary: 95 mg/dL (ref 70–99)

## 2021-04-15 MED ORDER — POTASSIUM CHLORIDE 20 MEQ PO PACK
40.0000 meq | PACK | Freq: Once | ORAL | Status: AC
  Administered 2021-04-15: 40 meq via ORAL
  Filled 2021-04-15: qty 2

## 2021-04-15 MED ORDER — POTASSIUM CHLORIDE CRYS ER 20 MEQ PO TBCR
40.0000 meq | EXTENDED_RELEASE_TABLET | Freq: Once | ORAL | Status: DC
  Filled 2021-04-15: qty 2

## 2021-04-15 MED ORDER — ADULT MULTIVITAMIN W/MINERALS CH
1.0000 | ORAL_TABLET | Freq: Every day | ORAL | Status: DC
  Administered 2021-04-15 – 2021-04-27 (×13): 1 via ORAL
  Filled 2021-04-15 (×13): qty 1

## 2021-04-15 MED ORDER — ALPRAZOLAM 0.25 MG PO TABS
0.2500 mg | ORAL_TABLET | Freq: Three times a day (TID) | ORAL | Status: DC | PRN
  Administered 2021-04-17 – 2021-04-26 (×11): 0.25 mg via ORAL
  Filled 2021-04-15 (×13): qty 1

## 2021-04-15 MED ORDER — INFLUENZA VAC SPLIT QUAD 0.5 ML IM SUSY
0.5000 mL | PREFILLED_SYRINGE | INTRAMUSCULAR | Status: AC
  Administered 2021-04-16: 0.5 mL via INTRAMUSCULAR
  Filled 2021-04-15: qty 0.5

## 2021-04-15 MED ORDER — ENSURE ENLIVE PO LIQD
237.0000 mL | Freq: Three times a day (TID) | ORAL | Status: DC
  Administered 2021-04-15 – 2021-04-26 (×27): 237 mL via ORAL

## 2021-04-15 MED ORDER — BETHANECHOL CHLORIDE 25 MG PO TABS
25.0000 mg | ORAL_TABLET | Freq: Three times a day (TID) | ORAL | Status: DC
  Administered 2021-04-15 – 2021-04-27 (×37): 25 mg via ORAL
  Filled 2021-04-15 (×37): qty 1

## 2021-04-15 NOTE — Progress Notes (Signed)
Physical Therapy Treatment Patient Details Name: Julian Allen MRN: [id number] DOB: June 29, 1981 Today's Date: 04/15/2021   History of Present Illness 40 yo male fall through ceiling landing on exercise equipment. intubated 10/2 C2-3 fx with canal stenosis with cord compression and prevertebral hematoma L1-2 fx dislocation 10/2 PLIF C2-4 L1-2 Decompression laminectomy with reduction of fx T12 L1 L2 L3 PLA with pedicle screw fixation and local autografting, PMH seizure, crohns disease    PT Comments    Seen in conjunction with OT and [employer] Rep Lorin Picket) for CTO fitting. Patient overall totalA+2 for rolling and supine<>sit. Patient with movement of bilateral LE (L>R) but impaired coordination and weakness. Continue to recommend comprehensive inpatient rehab (CIR) for post-acute therapy needs.     Recommendations for follow up therapy are one component of a multi-disciplinary discharge planning process, led by the attending physician.  Recommendations may be updated based on patient status, additional functional criteria and insurance authorization.  Follow Up Recommendations  CIR     Equipment Recommendations  Other (comment) (TBD)    Recommendations for Other Services       Precautions / Restrictions Precautions Precautions: Fall;Cervical;Back Precaution Booklet Issued: No Precaution Comments: watch BP Required Braces or Orthoses: Other Brace Other Brace: CTO, can be donned and doffed in sitting; cervical collar on while in bed (per chat text with neuro NP) Restrictions Weight Bearing Restrictions: No     Mobility  Bed Mobility Overal bed mobility: Needs Assistance Bed Mobility: Rolling;Sidelying to Sit;Sit to Supine Rolling: +2 for physical assistance;Total assist Sidelying to sit: Total assist;+2 for physical assistance   Sit to supine: +2 for physical assistance;Total assist   General bed mobility comments: totalA+2 for all aspects of bed mobility due to weakness and  pain.    Transfers                    Ambulation/Gait                 Stairs             Wheelchair Mobility    Modified Rankin (Stroke Patients Only)       Balance Overall balance assessment: Needs assistance Sitting-balance support: Bilateral upper extremity supported;Feet supported Sitting balance-Leahy Scale: Zero Sitting balance - Comments: strong posterior lean when core engaged and needs faciliation for upright posture, posterior pelvic tilt in sitting Postural control: Posterior lean                                  Cognition Arousal/Alertness: Awake/alert Behavior During Therapy: WFL for tasks assessed/performed Overall Cognitive Status: Within Functional Limits for tasks assessed                                        Exercises Other Exercises Other Exercises: Instructed pt on sequence of movements in Bil UEs while he is laying in bed--to do the sequence at least once an hour when he is awake (up to 2x/hour). Composite flexion/extension, thumb to each finger, finger abduction/adduction, wrist flexion/extension (patting pillow), forearm supination/pronation, touch nose with pointer finger or thumb, touch opposite shoulder.  Also educated him on moving toes, ankles and internal/external rotation of hips. He was able to do all of these with S.    General Comments General comments (skin integrity, edema, etc.): VSS on RA  Pertinent Vitals/Pain Pain Assessment: Faces Faces Pain Scale: Hurts little more Pain Location: arms/shoulders with working on exercises Pain Descriptors / Indicators: Sore;Aching;Discomfort Pain Intervention(s): Monitored during session    Home Living                      Prior Function            PT Goals (current goals can now be found in the care plan section) Acute Rehab PT Goals Patient Stated Goal: get stronger PT Goal Formulation: With patient Time For Goal  Achievement: 04/27/21 Potential to Achieve Goals: Good Progress towards PT goals: Progressing toward goals    Frequency    Min 4X/week      PT Plan Current plan remains appropriate    Co-evaluation PT/OT/SLP Co-Evaluation/Treatment: Yes Reason for Co-Treatment: Complexity of the patient's impairments (multi-system involvement);For patient/therapist safety PT goals addressed during session: Mobility/safety with mobility;Balance OT goals addressed during session: Strengthening/ROM      AM-PAC PT "6 Clicks" Mobility   Outcome Measure  Help needed turning from your back to your side while in a flat bed without using bedrails?: Total Help needed moving from lying on your back to sitting on the side of a flat bed without using bedrails?: Total Help needed moving to and from a bed to a chair (including a wheelchair)?: Total Help needed standing up from a chair using your arms (e.g., wheelchair or bedside chair)?: Total Help needed to walk in hospital room?: Total Help needed climbing 3-5 steps with a railing? : Total 6 Click Score: 6    End of Session         PT Visit Diagnosis: Unsteadiness on feet (R26.81);Muscle weakness (generalized) (M62.81);Pain;Difficulty in walking, not elsewhere classified (R26.2)     Time: 5916-3846 PT Time Calculation (min) (ACUTE ONLY): 38 min  Charges:  $Therapeutic Activity: 23-37 mins                     Travers Goodley A. Dan Humphreys PT, DPT Acute Rehabilitation Services Pager [phone] Office [phone]    [name] 04/15/2021, 5:17 PM

## 2021-04-15 NOTE — Progress Notes (Signed)
Nutrition Follow-up  DOCUMENTATION CODES:   Not applicable  INTERVENTION:   -Ensure Enlive po TID, each supplement provides 350 kcal and 20 grams of protein  -MVI with minerals daily -Magic cup TID with meals, each supplement provides 290 kcal and 9 grams of protein  -Hormel Shake TID with meals, each supplement provides 520 kcals and 22 grams protein  NUTRITION DIAGNOSIS:   Inadequate oral intake related to inability to eat as evidenced by NPO status.  Progressing; advanced to a dysphagia 3 diet with thin liquids on 04/15/21  GOAL:   Patient will meet greater than or equal to 90% of their needs  Progressing   MONITOR:   Diet advancement, Labs, Weight trends, Skin, I & O's  REASON FOR ASSESSMENT:   Ventilator    ASSESSMENT:   Julian Allen is an 40 y.o. male whom arrived as level 2 activation s/p fall through ceiling at home and landing on exercise equipment at approximately. Fell approx 10 ft.  He was amnestic to the events. Arrived complaining of back pain. Underwent workup in ER and then was upgraded to level 1 when he was not breathing or moving. Did not loose pulses. Reportedly flaccid paralysis, bag mask initiated and intubated.  10/2- s/p C2-C3-C4 decompressive laminectomy; C2-3-4 posterior lateral fusion utilizing lateral mass instrumentation, local autograft; L1-L2 decompressive laminectomy and reduction of fracture dislocation;  T12 L1-L2-L3 posterior lateral arthrodesis utilizing segmental pedicle screw fixation and local autografting 10/5- extubated, s/p BSE- recommend NPO 10/6- s/p BSE- advanced to dysphagai 3 diet witht hin liquids  Reviewed I/O's: -57 ml x 24 hours and +6.7 L since admission  UOP: 1.4 L x 24 hours  Rectal tube output: 150 ml x 24 hours  Pt just advanced to a dysphagia 3 diet with thin liquids. RD will add supplements due to increased nutritional needs secondary to trauma.    Medications reviewed and include miralax, keppra, and colace.    Labs reviewed: K: 3.3, CBGS: 84-102 (inpatient orders for glycemic control are 0-15 units insulin aspart every 4 hours).    Diet Order:   Diet Order             DIET DYS 3 Room service appropriate? Yes with Assist; Fluid consistency: Thin  Diet effective now                   EDUCATION NEEDS:   Not appropriate for education at this time  Skin:  Skin Assessment: Skin Integrity Issues: Skin Integrity Issues:: Incisions Incisions: closed neck and back  Last BM:  Unknown  Height:   Ht Readings from Last 1 Encounters:  04/11/21 5\' 8"  (1.727 m)    Weight:   Wt Readings from Last 1 Encounters:  04/11/21 75.3 kg    Ideal Body Weight:  70 kg  BMI:  Body mass index is 25.24 kg/m.  Estimated Nutritional Needs:   Kcal:  2400-2600  Protein:  125-150 grams  Fluid:  > 2 L    06/11/21, RD, LDN, CDCES Registered Dietitian II Certified Diabetes Care and Education Specialist Please refer to [facility] for RD and/or RD on-call/weekend/after hours pager

## 2021-04-15 NOTE — Progress Notes (Signed)
Patient ID: Julian Allen, male   DOB: [birth year], 40 y.o.   MRN: [id number] Follow up - Trauma Critical Care  Patient Details:    Julian Allen is an 40 y.o. male.  Lines/tubes : External Urinary Catheter (Active)  Collection Container Standard drainage bag 04/14/21 2000  Suction (Verified suction is between 40-80 mmHg) N/A (Patient has condom catheter) 04/14/21 2000  Securement Method Securing device (Describe) 04/14/21 2000  Site Assessment Clean;Intact 04/14/21 2000     Fecal Management System (Active)  Output (mL) 150 mL 04/15/21 0600    Microbiology/Sepsis markers: Results for orders placed or performed during the hospital encounter of 04/11/21  Resp Panel by RT-PCR (Flu A&B, Covid) Nasopharyngeal Swab     Status: None   Collection Time: 04/11/21  4:00 AM   Specimen: Nasopharyngeal Swab; Nasopharyngeal(NP) swabs in vial transport medium  Result Value Ref Range Status   SARS Coronavirus 2 by RT PCR NEGATIVE NEGATIVE Final    Comment: (NOTE) SARS-CoV-2 target nucleic acids are NOT DETECTED.  The SARS-CoV-2 RNA is generally detectable in upper respiratory specimens during the acute phase of infection. The lowest concentration of SARS-CoV-2 viral copies this assay can detect is 138 copies/mL. A negative result does not preclude SARS-Cov-2 infection and should not be used as the sole basis for treatment or other patient management decisions. A negative result may occur with  improper specimen collection/handling, submission of specimen other than nasopharyngeal swab, presence of viral mutation(s) within the areas targeted by this assay, and inadequate number of viral copies(<138 copies/mL). A negative result must be combined with clinical observations, patient history, and epidemiological information. The expected result is Negative.  Fact Sheet for Patients:  BloggerCourse.com  Fact Sheet for Healthcare Providers:   SeriousBroker.it  This test is no t yet approved or cleared by the [place] FDA and  has been authorized for detection and/or diagnosis of SARS-CoV-2 by FDA under an Emergency Use Authorization (EUA). This EUA will remain  in effect (meaning this test can be used) for the duration of the COVID-19 declaration under Section 564(b)(1) of the Act, 21 U.S.C.section 360bbb-3(b)(1), unless the authorization is terminated  or revoked sooner.       Influenza A by PCR NEGATIVE NEGATIVE Final   Influenza B by PCR NEGATIVE NEGATIVE Final    Comment: (NOTE) The Xpert Xpress SARS-CoV-2/FLU/RSV plus assay is intended as an aid in the diagnosis of influenza from Nasopharyngeal swab specimens and should not be used as a sole basis for treatment. Nasal washings and aspirates are unacceptable for Xpert Xpress SARS-CoV-2/FLU/RSV testing.  Fact Sheet for Patients: BloggerCourse.com  Fact Sheet for Healthcare Providers: SeriousBroker.it  This test is not yet approved or cleared by the [place] FDA and has been authorized for detection and/or diagnosis of SARS-CoV-2 by FDA under an Emergency Use Authorization (EUA). This EUA will remain in effect (meaning this test can be used) for the duration of the COVID-19 declaration under Section 564(b)(1) of the Act, 21 U.S.C. section 360bbb-3(b)(1), unless the authorization is terminated or revoked.  Performed at [facility], [address] [place], [place] [address]   MRSA Next Gen by PCR, Nasal     Status: None   Collection Time: 04/11/21  9:35 AM   Specimen: Nasal Mucosa; Nasal Swab  Result Value Ref Range Status   MRSA by PCR Next Gen NOT DETECTED NOT DETECTED Final    Comment: (NOTE) The GeneXpert MRSA Assay (FDA approved for NASAL specimens only), is one component  of a comprehensive MRSA colonization surveillance program. It is not intended to diagnose  MRSA infection nor to guide or monitor treatment for MRSA infections. Test performance is not FDA approved in patients less than 55 years old. Performed at [facility], [address], [place], [place] [address]     Anti-infectives:  Anti-infectives (From admission, onward)    Start     Dose/Rate Route Frequency Ordered Stop   04/11/21 1800  ceFAZolin (ANCEF) IVPB 2g/100 mL premix  Status:  Discontinued        2 g 200 mL/hr over 30 Minutes Intravenous Every 8 hours 04/11/21 1659 04/14/21 1606   04/11/21 1659  ceFAZolin (ANCEF) IVPB 2g/100 mL premix  Status:  Discontinued        2 g 200 mL/hr over 30 Minutes Intravenous 30 min pre-op 04/11/21 1700 04/11/21 1715   04/11/21 1255  vancomycin (VANCOCIN) powder  Status:  Discontinued          As needed 04/11/21 1310 04/11/21 1535       Best Practice/Protocols:  VTE Prophylaxis: Lovenox (prophylaxtic dose) .  Consults: Treatment Team:  Julio Sicks, MD    Studies:    Events:  Subjective:    Overnight Issues:   Objective:  Vital signs for last 24 hours: Temp:  [98.5 F (36.9 C)-100 F (37.8 C)] 99.1 F (37.3 C) (10/06 0800) Pulse Rate:  [51-90] 90 (10/06 0800) Resp:  [7-30] 24 (10/06 0800) BP: (101-146)/(54-80) 138/62 (10/06 0800) SpO2:  [93 %-99 %] 97 % (10/06 0800) Arterial Line BP: (130-159)/(67-79) 139/67 (10/05 1200)  Hemodynamic parameters for last 24 hours:    Intake/Output from previous day: 10/05 0701 - 10/06 0700 In: 1459.3 [I.V.:991.3; Blood:360; IV Piggyback:107.9] Out: 1516 [Urine:1366; Stool:150]  Intake/Output this shift: No intake/output data recorded.  Vent settings for last 24 hours:    Physical Exam:  General: alert and no respiratory distress Neuro: alert, oriented, and MAE more than my prev exam. L side >R side HEENT/Neck: no JVD Resp: clear to auscultation bilaterally CVS: RRR with ectopy GI: soft, nontender, BS WNL, no r/g Extremities: calves soft  Results for orders  placed or performed during the hospital encounter of 04/11/21 (from the past 24 hour(s))  Glucose, capillary     Status: Abnormal   Collection Time: 04/14/21 11:56 AM  Result Value Ref Range   Glucose-Capillary 119 (H) 70 - 99 mg/dL  CBC     Status: Abnormal   Collection Time: 04/14/21  1:42 PM  Result Value Ref Range   WBC 7.8 4.0 - 10.5 K/uL   RBC 2.72 (L) 4.22 - 5.81 MIL/uL   Hemoglobin 8.7 (L) 13.0 - 17.0 g/dL   HCT 70.2 (L) 63.7 - 85.8 %   MCV 92.6 80.0 - 100.0 fL   MCH 32.0 26.0 - 34.0 pg   MCHC 34.5 30.0 - 36.0 g/dL   RDW 85.0 (H) 27.7 - 41.2 %   Platelets 133 (L) 150 - 400 K/uL   nRBC 0.0 0.0 - 0.2 %  Glucose, capillary     Status: Abnormal   Collection Time: 04/14/21  3:59 PM  Result Value Ref Range   Glucose-Capillary 108 (H) 70 - 99 mg/dL  Glucose, capillary     Status: Abnormal   Collection Time: 04/14/21  7:41 PM  Result Value Ref Range   Glucose-Capillary 115 (H) 70 - 99 mg/dL  Glucose, capillary     Status: Abnormal   Collection Time: 04/14/21 11:13 PM  Result Value  Ref Range   Glucose-Capillary 102 (H) 70 - 99 mg/dL  Glucose, capillary     Status: None   Collection Time: 04/15/21  3:16 AM  Result Value Ref Range   Glucose-Capillary 84 70 - 99 mg/dL  CBC     Status: Abnormal   Collection Time: 04/15/21  6:33 AM  Result Value Ref Range   WBC 9.3 4.0 - 10.5 K/uL   RBC 3.02 (L) 4.22 - 5.81 MIL/uL   Hemoglobin 9.4 (L) 13.0 - 17.0 g/dL   HCT 79.8 (L) 92.1 - 19.4 %   MCV 92.1 80.0 - 100.0 fL   MCH 31.1 26.0 - 34.0 pg   MCHC 33.8 30.0 - 36.0 g/dL   RDW 17.4 (H) 08.1 - 44.8 %   Platelets 155 150 - 400 K/uL   nRBC 0.0 0.0 - 0.2 %  Basic metabolic panel     Status: Abnormal   Collection Time: 04/15/21  6:33 AM  Result Value Ref Range   Sodium 140 135 - 145 mmol/L   Potassium 3.3 (L) 3.5 - 5.1 mmol/L   Chloride 108 98 - 111 mmol/L   CO2 24 22 - 32 mmol/L   Glucose, Bld 106 (H) 70 - 99 mg/dL   BUN 14 6 - 20 mg/dL   Creatinine, Ser 1.85 (L) 0.61 - 1.24 mg/dL    Calcium 8.1 (L) 8.9 - 10.3 mg/dL   GFR, Estimated >63 >14 mL/min   Anion gap 8 5 - 15  Glucose, capillary     Status: None   Collection Time: 04/15/21  7:40 AM  Result Value Ref Range   Glucose-Capillary 90 70 - 99 mg/dL    Assessment & Plan: Present on Admission:  Fall, initial encounter    LOS: 4 days   Additional comments:I reviewed the patient's new clinical lab test results. . 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 SLP to work again with him - held off on Cortrak yesterday as they expect recovery Ankylosing spondylitis - hold home Imuran per NS Crohn's - hold Humira for now  Acute urinary retention, may be neurogenic component - I&O protocol, start urecholine Anxiety - buspar started 10/5, add PRN Xanax. VTE - PAS, LMWH  FEN - IVF, ST re-eval Dispo - ICU I spoke with his father. His father is very concerned about his anxiety and says he might refuse meds. I updated him about our plan. Critical Care Total Time*: 35 Minutes  Violeta Gelinas, MD, MPH, FACS Trauma & General Surgery Use [email or web address] to contact on call provider  04/15/2021  *Care during the described time interval was provided by me. I have reviewed this patient's available data, including medical history, events of note, physical examination and test results as part of my evaluation.

## 2021-04-15 NOTE — Progress Notes (Addendum)
Speech Language Pathology Treatment: Dysphagia  Patient Details Name: Julian Allen MRN: [id number] DOB: August 18, 1980 Today's Date: 04/15/2021 Time: 6333-5456 SLP Time Calculation (min) (ACUTE ONLY): 21 min  Assessment / Plan / Recommendation Clinical Impression  Pt was seen for therapy targeting swallowing goals. Pt reported intermittent coughing with ice chips/puree with RN. SLP trailed small and large sips of thin liquid from cup/straw, puree, and regular solids. Pt required full assist with cup and partial assist with regular solid for feeding but showed no oral phase impairments, although SLP noted pt talks while chewing and directly after swallowing despite intermittent verbal cues to wait at least 3 seconds after swallowing to speak. Pharyngeal phase c/b immediate cough and throat clear with large sips of thin and thin liquid wash following regular solid. Small sips mitigated pharyngeal symptoms. SLP suspects that small amounts of thin liquid are penetrating intermittently but are sensed and cleared with strong cough. Pt educated re: aspiration precautions and use of compensatory strategies. Recommend Dys3/thin liquid diet using small sips/bites compensatory strategy and refraining from speaking directly after a swallow. SLP will continue to follow for management of diet toleration.    HPI HPI: 40 yo male fall through ceiling landing on exercise equipment. intubated 10/2-10/5 C2-3 fx with canal stenosis with cord compression and prevertebral hematoma L1-2 fx dislocation 10/2 PLIF C2-4 L1-2 Decompression laminectomy with reduction of fx T12 L1 L2 L3 PLA with pedicle screw fixation and local autografting, PMH seizure, crohns disease, pt reported ACDF surgery "after another fall years ago".      SLP Plan  Continue with current plan of care      Recommendations for follow up therapy are one component of a multi-disciplinary discharge planning process, led by the attending physician.   Recommendations may be updated based on patient status, additional functional criteria and insurance authorization.    Recommendations  Diet recommendations: Thin liquid;Dysphagia 3 (mechanical soft) Liquids provided via: Straw;Cup Medication Administration: Crushed with puree Supervision: Staff to assist with self feeding Compensations: Slow rate;Small sips/bites (no verbalizations after swallow) Postural Changes and/or Swallow Maneuvers: Seated upright 90 degrees                Oral Care Recommendations: Oral care BID Follow up Recommendations: 24 hour supervision/assistance SLP Visit Diagnosis: Dysphagia, unspecified (R13.10) Plan: Continue with current plan of care       GO             Jeannie Done, SLP-Student    Jeannie Done  04/15/2021, 12:23 PM

## 2021-04-15 NOTE — Progress Notes (Signed)
Occupational Therapy Treatment Patient Details Name: Julian Allen MRN: [id number] DOB: 1980/07/22 Today's Date: 04/15/2021   History of present illness 40 yo male fall through ceiling landing on exercise equipment. intubated 10/2 C2-3 fx with canal stenosis with cord compression and prevertebral hematoma L1-2 fx dislocation 10/2 PLIF C2-4 L1-2 Decompression laminectomy with reduction of fx T12 L1 L2 L3 PLA with pedicle screw fixation and local autografting, PMH seizure, crohns disease   OT comments  Pt seen a 2nd time today to educate RN on doffing/donning CTO and to start Bil UE and minimal LE exercise program with him. He is getting movement back in extremities x4 with ataxia noted.He will continue to benefit from acute OT with follow up on a CIR venue.   Recommendations for follow up therapy are one component of a multi-disciplinary discharge planning process, led by the attending physician.  Recommendations may be updated based on patient status, additional functional criteria and insurance authorization.    Follow Up Recommendations  CIR;Supervision/Assistance - 24 hour    Equipment Recommendations  3 in 1 bedside commode;Wheelchair (measurements OT);Wheelchair cushion (measurements OT);Hospital bed    Recommendations for Other Services Rehab consult    Precautions / Restrictions Precautions Precautions: Fall;Cervical;Back Precaution Comments: watch BP Required Braces or Orthoses: Other Brace Other Brace: CTO, can be donned and doffed in sitting; cervical collar on while in bed (per chat text with neuro NP) Restrictions Weight Bearing Restrictions: No              ADL either performed or assessed with clinical judgement   ADL                                         General ADL Comments: Pt seen to show RN how to doff/don CTO since he was not present when pt was fitted with CTO--education completed.     Vision Ability to See in Adequate Light: 0  Adequate            Cognition Arousal/Alertness: Awake/alert Behavior During Therapy: WFL for tasks assessed/performed Overall Cognitive Status: Within Functional Limits for tasks assessed                                          Exercises Other Exercises Other Exercises: Instructed pt on sequence of movements in Bil UEs while he is laying in bed--to do the sequence at least once an hour when he is awake (up to 2x/hour). Composite flexion/extension, thumb to each finger, finger abduction/adduction, wrist flexion/extension (patting pillow), forearm supination/pronation, touch nose with pointer finger or thumb, touch opposite shoulder.  Also educated him on moving toes, ankles and internal/external rotation of hips. He was able to do all of these with S.           Pertinent Vitals/ Pain       Pain Assessment: Faces Faces Pain Scale: Hurts little more Pain Location: arms/shoulders with working on exercises Pain Descriptors / Indicators: Sore;Aching;Discomfort Pain Intervention(s): Limited activity within patient's tolerance;Monitored during session;Repositioned         Frequency  Min 3X/week        Progress Toward Goals  OT Goals(current goals can now be found in the care plan section)  Progress towards OT goals: Progressing toward goals  Acute Rehab OT Goals Patient Stated  Goal: get stronger OT Goal Formulation: With patient Time For Goal Achievement: 04/27/21 Potential to Achieve Goals: Good  Plan Discharge plan remains appropriate    Co-evaluation    PT/OT/SLP Co-Evaluation/Treatment: Yes Reason for Co-Treatment: Complexity of the patient's impairments (multi-system involvement);For patient/therapist safety PT goals addressed during session: Mobility/safety with mobility;Balance OT goals addressed during session: Strengthening/ROM      AM-PAC OT "6 Clicks" Daily Activity     Outcome Measure   Help from another person eating meals?: A  Lot Help from another person taking care of personal grooming?: A Lot Help from another person toileting, which includes using toliet, bedpan, or urinal?: Total Help from another person bathing (including washing, rinsing, drying)?: A Lot Help from another person to put on and taking off regular upper body clothing?: Total Help from another person to put on and taking off regular lower body clothing?: Total 6 Click Score: 9    End of Session Equipment Utilized During Treatment:  (CTO)  OT Visit Diagnosis: Unsteadiness on feet (R26.81);Other abnormalities of gait and mobility (R26.89);Ataxia, unspecified (R27.0);Pain;Muscle weakness (generalized) (M62.81) Pain - Right/Left:  (both) Pain - part of body:  (arms/shoulders)   Activity Tolerance Patient tolerated treatment well   Patient Left in bed;with call bell/phone within reach;with family/visitor present (RN securing IV (bed raised and alarm not on))   Nurse Communication  (How to doff/donn CTO)        Time: 5409-8119 OT Time Calculation (min): 20 min  Charges: OT General Charges $OT Visit: 1 Visit OT Treatments $Self Care/Home Management : 8-22 mins $Therapeutic Exercise: 8-22 mins  Ignacia Palma, OTR/L Acute Rehab Services Pager [phone] Office [phone] ]  Evette Georges 04/15/2021, 2:01 PM

## 2021-04-15 NOTE — Progress Notes (Signed)
Occupational Therapy Treatment Patient Details Name: Julian Allen MRN: [id number] DOB: 12/01/1980 Today's Date: 04/15/2021   History of present illness 40 yo male fall through ceiling landing on exercise equipment. intubated 10/2 C2-3 fx with canal stenosis with cord compression and prevertebral hematoma L1-2 fx dislocation 10/2 PLIF C2-4 L1-2 Decompression laminectomy with reduction of fx T12 L1 L2 L3 PLA with pedicle screw fixation and local autografting, PMH seizure, crohns disease   OT comments  This 40 yo male seen in conjunction with PT today for fitting of CTO by [employer] Lorin Picket), worked on rolling in bed, getting up to EOB, sit>supine. Pt is total A +2 for all mobility with being able to A some with movement of Bil LEs (L>R) and Bil UEs (L>R). He will continue to benefit from acute OT with follow up at a CIR venue.   Recommendations for follow up therapy are one component of a multi-disciplinary discharge planning process, led by the attending physician.  Recommendations may be updated based on patient status, additional functional criteria and insurance authorization.    Follow Up Recommendations  CIR;Supervision/Assistance - 24 hour    Equipment Recommendations  3 in 1 bedside commode;Wheelchair (measurements OT);Wheelchair cushion (measurements OT);Hospital bed    Recommendations for Other Services Rehab consult    Precautions / Restrictions Precautions Precautions: Fall;Cervical;Back Precaution Comments: watch BP Required Braces or Orthoses: Other Brace Other Brace: CTO, can be donned and doffed in sitting; cervical collar on while in bed Restrictions Weight Bearing Restrictions: No       Mobility Bed Mobility Overal bed mobility: Needs Assistance Bed Mobility: Rolling;Sidelying to Sit;Sit to Supine Rolling: +2 for physical assistance;Total assist Sidelying to sit: Total assist;+2 for physical assistance   Sit to supine: +2 for physical assistance;Total assist                   Balance Overall balance assessment: Needs assistance Sitting-balance support: Bilateral upper extremity supported;Feet supported Sitting balance-Leahy Scale: Zero Sitting balance - Comments: strong posterior lean when core engaged and needs faciliation for upright posture, posterior pelvic tilt in sitting                                      Vision Ability to See in Adequate Light: 0 Adequate            Cognition Arousal/Alertness: Awake/alert Behavior During Therapy: WFL for tasks assessed/performed Overall Cognitive Status: Within Functional Limits for tasks assessed                                                     Pertinent Vitals/ Pain       Pain Assessment: Faces Faces Pain Scale: Hurts little more Pain Location: all over Pain Descriptors / Indicators: Sore;Aching;Discomfort Pain Intervention(s): Limited activity within patient's tolerance;Monitored during session;Repositioned                   Frequency  Min 3X/week        Progress Toward Goals  OT Goals(current goals can now be found in the care plan section)  Progress towards OT goals: Progressing toward goals  Acute Rehab OT Goals Patient Stated Goal: get stronger OT Goal Formulation: With patient Time For Goal Achievement: 04/27/21 Potential to Achieve Goals:  Good  Plan Discharge plan remains appropriate    Co-evaluation    PT/OT/SLP Co-Evaluation/Treatment: Yes Reason for Co-Treatment: For patient/therapist safety;To address functional/ADL transfers;Complexity of the patient's impairments (multi-system involvement) PT goals addressed during session: Mobility/safety with mobility;Balance OT goals addressed during session: Strengthening/ROM      AM-PAC OT "6 Clicks" Daily Activity     Outcome Measure   Help from another person eating meals?: A Lot Help from another person taking care of personal grooming?: A Lot Help from  another person toileting, which includes using toliet, bedpan, or urinal?: Total Help from another person bathing (including washing, rinsing, drying)?: A Lot Help from another person to put on and taking off regular upper body clothing?: Total Help from another person to put on and taking off regular lower body clothing?: Total 6 Click Score: 9    End of Session Equipment Utilized During Treatment:  (CTO)  OT Visit Diagnosis: Unsteadiness on feet (R26.81);Other abnormalities of gait and mobility (R26.89);Ataxia, unspecified (R27.0);Pain;Muscle weakness (generalized) (M62.81) Pain - part of body:  (all over)   Activity Tolerance Patient tolerated treatment well   Patient Left in bed;with call bell/phone within reach;with family/visitor present           Time: 2130-8657 OT Time Calculation (min): 37 min  Charges: OT General Charges $OT Visit: 1 Visit OT Treatments $Self Care/Home Management : 8-22 mins  Ignacia Palma, OTR/L Acute [employer] Pager [phone] Office [phone]    Evette Georges 04/15/2021, 1:49 PM

## 2021-04-15 NOTE — Progress Notes (Signed)
   Providing Compassionate, Quality Care - Together   Subjective: Patient reports no issues overnight. Patient's father with concerns about patient's anxiety level.  Objective: Vital signs in last 24 hours: Temp:  [98.5 F (36.9 C)-100 F (37.8 C)] 99.1 F (37.3 C) (10/06 0800) Pulse Rate:  [53-90] 90 (10/06 0800) Resp:  [7-30] 24 (10/06 0800) BP: (101-146)/(54-80) 138/62 (10/06 0800) SpO2:  [93 %-99 %] 97 % (10/06 0800) Arterial Line BP: (139-159)/(67-79) 139/67 (10/05 1200)  Intake/Output from previous day: 10/05 0701 - 10/06 0700 In: 1459.3 [I.V.:991.3; Blood:360; IV Piggyback:107.9] Out: 1516 [Urine:1366; Stool:150] Intake/Output this shift: No intake/output data recorded.  Alert and oriented PERRLA Speech clear, RA Wiggles fingers and toes. Able to raise BUE and left leg off the bed. Incisions are covered with Honeycomb dressings and Steri Strips; Dressings are dry and intact, with a small amount of dried sanguinous drainage.  Lab Results: Recent Labs    04/14/21 1342 04/15/21 0633  WBC 7.8 9.3  HGB 8.7* 9.4*  HCT 25.2* 27.8*  PLT 133* 155   BMET Recent Labs    04/14/21 0607 04/15/21 0633  NA 136 140  K 3.7 3.3*  CL 105 108  CO2 22 24  GLUCOSE 114* 106*  BUN 14 14  CREATININE 0.66 0.60*  CALCIUM 8.0* 8.1*    Studies/Results: No results found.  Assessment/Plan: Patient sustained a chance type fracture at C2-3 with marked angulation and severe spinal canal stenosis and a severe fracture dislocation with distraction at L1-L2 following a fall from about 10'. Dr. Jordan Likes performed a C2-C3-C4 decompressive laminectomy, followed by a C2-3-4 posterior lateral fusion utilizing lateral mass instrumentation on 04/11/2021. The patient is moving his hands and feet and confirms he has sensation in all extremities. CTA neck on 04/12/2021 negative. Extubated 04/14/2021.   LOS: 4 days   -CTO brace at the bedside. Pt to work with therapies later today.    Val Eagle, DNP, AGNP-C Nurse Practitioner  [facility] [address], [place], [place] [address] P: [phone]    F: [phone]  04/15/2021, 9:12 AM

## 2021-04-16 ENCOUNTER — Inpatient Hospital Stay (HOSPITAL_COMMUNITY): Payer: BLUE CROSS/BLUE SHIELD

## 2021-04-16 LAB — CBC
HCT: 26.3 % — ABNORMAL LOW (ref 39.0–52.0)
Hemoglobin: 8.7 g/dL — ABNORMAL LOW (ref 13.0–17.0)
MCH: 31.3 pg (ref 26.0–34.0)
MCHC: 33.1 g/dL (ref 30.0–36.0)
MCV: 94.6 fL (ref 80.0–100.0)
Platelets: 174 10*3/uL (ref 150–400)
RBC: 2.78 MIL/uL — ABNORMAL LOW (ref 4.22–5.81)
RDW: 15.9 % — ABNORMAL HIGH (ref 11.5–15.5)
WBC: 7.2 10*3/uL (ref 4.0–10.5)
nRBC: 0 % (ref 0.0–0.2)

## 2021-04-16 LAB — BASIC METABOLIC PANEL
Anion gap: 4 — ABNORMAL LOW (ref 5–15)
BUN: 10 mg/dL (ref 6–20)
CO2: 25 mmol/L (ref 22–32)
Calcium: 7.9 mg/dL — ABNORMAL LOW (ref 8.9–10.3)
Chloride: 111 mmol/L (ref 98–111)
Creatinine, Ser: 0.58 mg/dL — ABNORMAL LOW (ref 0.61–1.24)
GFR, Estimated: >60 mL/min (ref >60)
Glucose, Bld: 112 mg/dL — ABNORMAL HIGH (ref 70–99)
Potassium: 3.6 mmol/L (ref 3.5–5.1)
Sodium: 140 mmol/L (ref 135–145)

## 2021-04-16 MED ORDER — AZATHIOPRINE 50 MG PO TABS
150.0000 mg | ORAL_TABLET | Freq: Two times a day (BID) | ORAL | Status: DC
  Administered 2021-04-16 – 2021-04-27 (×22): 150 mg via ORAL
  Filled 2021-04-16 (×24): qty 3

## 2021-04-16 MED ORDER — TAMSULOSIN HCL 0.4 MG PO CAPS
0.4000 mg | ORAL_CAPSULE | Freq: Every day | ORAL | Status: DC
  Administered 2021-04-16 – 2021-04-27 (×12): 0.4 mg via ORAL
  Filled 2021-04-16 (×12): qty 1

## 2021-04-16 NOTE — Progress Notes (Signed)
Patient ID: Julian Allen, male   DOB: 01/17/1981, 40 y.o.   MRN: [id number] 5 Days Post-Op   Subjective: Reports he is moving his arms a little more. Has been taking the anxiety meds. ROS negative except as listed above. Objective: Vital signs in last 24 hours: Temp:  [98.2 F (36.8 C)-99.7 F (37.6 C)] 98.8 F (37.1 C) (10/07 0800) Pulse Rate:  [56-95] 70 (10/07 0900) Resp:  [8-31] 23 (10/07 0900) BP: (113-155)/(57-76) 144/76 (10/07 0900) SpO2:  [95 %-99 %] 97 % (10/07 0900) Weight:  [73.4 kg] 73.4 kg (10/07 0408) Last BM Date: 04/15/21  Intake/Output from previous day: 10/06 0701 - 10/07 0700 In: 2300 [I.V.:2100; IV Piggyback:200] Out: 2350 [Urine:1800; Stool:550] Intake/Output this shift: Total I/O In: -  Out: 330 [Urine:330]  General appearance: cooperative Neck: collar Cardio: regular rate and rhythm GI: soft, NT Extremities: calves soft Neurologic: Mental status: Alert, oriented, thought content appropriate Motor: B grip and biceps L>R, lifts LLE, some movement RLE  Lab Results: CBC  Recent Labs    04/15/21 0633 04/16/21 0302  WBC 9.3 7.2  HGB 9.4* 8.7*  HCT 27.8* 26.3*  PLT 155 174   BMET Recent Labs    04/15/21 0633 04/16/21 0302  NA 140 140  K 3.3* 3.6  CL 108 111  CO2 24 25  GLUCOSE 106* 112*  BUN 14 10  CREATININE 0.60* 0.58*  CALCIUM 8.1* 7.9*   PT/INR No results for input(s): LABPROT, INR in the last 72 hours. ABG No results for input(s): PHART, HCO3 in the last 72 hours.  Invalid input(s): PCO2, PO2  Studies/Results: No results found.  Anti-infectives: Anti-infectives (From admission, onward)    Start     Dose/Rate Route Frequency Ordered Stop   04/11/21 1800  ceFAZolin (ANCEF) IVPB 2g/100 mL premix  Status:  Discontinued        2 g 200 mL/hr over 30 Minutes Intravenous Every 8 hours 04/11/21 1659 04/14/21 1606   04/11/21 1659  ceFAZolin (ANCEF) IVPB 2g/100 mL premix  Status:  Discontinued        2 g 200 mL/hr over 30  Minutes Intravenous 30 min pre-op 04/11/21 1700 04/11/21 1715   04/11/21 1255  vancomycin (VANCOCIN) powder  Status:  Discontinued          As needed 04/11/21 1310 04/11/21 1535       Assessment/Plan: Fall through ceiling  C2-3 Chance FX with severe stenosis and SCI - S/P decompressive laminectomy and C2-4 posterior lateral fusion by Dr. 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 Improving. VTE - PAS, LMWH  FEN - on D3 thins, D/C IVF Dispo - to 4NP, plan CIR  LOS: 5 days    Violeta Gelinas, MD, MPH, FACS Trauma & General Surgery Use [email or web address] to contact on call provider  04/16/2021

## 2021-04-16 NOTE — Progress Notes (Addendum)
Speech Language Pathology Treatment: Dysphagia  Patient Details Name: Reiner Loewen MRN: [id number] DOB: 1981-06-27 Today's Date: 04/16/2021 Time: 8280-0349 SLP Time Calculation (min) (ACUTE ONLY): 16 min  Assessment / Plan / Recommendation Clinical Impression  Skilled dysphagia intervention during breakfast with hand over hand with milk carton and utensil. RN tech and pt reported pt had several productive coughs. He took small sips from straw with supervision cues and did not vocalize after swallows 90% of session. Coughed once after initial bite grits clearing and suctioning. Mastication was more prolonged than expected. He requested to sit more upright. His intact cognition, suspected adequate sensation, strong reflexive cough and motivation to improve are beneficial to his safety and success in therapy. Continue Dys 3 texture for energy conservation, thin liquids, strategies and ST.   HPI HPI: 40 yo male fall through ceiling landing on exercise equipment. intubated 10/2-10/5 C2-3 fx with canal stenosis with cord compression and prevertebral hematoma L1-2 fx dislocation 10/2 PLIF C2-4 L1-2 Decompression laminectomy with reduction of fx T12 L1 L2 L3 PLA with pedicle screw fixation and local autografting, PMH seizure, crohns disease, pt reported ACDF surgery "after another fall years ago".      SLP Plan  Continue with current plan of care      Recommendations for follow up therapy are one component of a multi-disciplinary discharge planning process, led by the attending physician.  Recommendations may be updated based on patient status, additional functional criteria and insurance authorization.    Recommendations  Diet recommendations: Dysphagia 3 (mechanical soft);Thin liquid Liquids provided via: Straw;Cup Medication Administration: Crushed with puree Supervision: Staff to assist with self feeding Compensations: Slow rate;Small sips/bites (no vocalization immediately after  swallow) Postural Changes and/or Swallow Maneuvers: Seated upright 90 degrees                Oral Care Recommendations: Oral care BID Follow up Recommendations: 24 hour supervision/assistance SLP Visit Diagnosis: Dysphagia, unspecified (R13.10) Plan: Continue with current plan of care       GO                Royce Macadamia  04/16/2021, 9:42 AM  Breck Coons Lonell Face.Ed Nurse, children's [phone] Office [phone]

## 2021-04-16 NOTE — Progress Notes (Signed)
  BRACE APPLICATION IN Correct positioning  FRONT   Start at the bottom clip of the brace at the ribs Then the shoulder clip ( make sure the black clip is in the lock position) works like a zip tie Next is the check clip Final step is the head strap The chin should rest on the brace   *of note - please make the cervical and thoracic braces touch like in this picture in sitting. If the patient is in supine then you will need to turn the knobs and open the brace up to create a space between the braces.    SIDE VIEW   BACK VIEW    Julian Allen, OTR/L  Acute Rehabilitation Services Pager: [phone] Office: [phone] .

## 2021-04-16 NOTE — Progress Notes (Signed)
Physical Therapy Treatment Patient Details Name: Julian Allen MRN: [id number] DOB: 12/14/1980 Today's Date: 04/16/2021   History of Present Illness 40 yo male fall through ceiling landing on exercise equipment. intubated 10/2 C2-3 fx with canal stenosis with cord compression and prevertebral hematoma L1-2 fx dislocation 10/2 PLIF C2-4 L1-2 Decompression laminectomy with reduction of fx T12 L1 L2 L3 PLA with pedicle screw fixation and local autografting, PMH seizure, crohns disease    PT Comments    Patient seen in conjunction with OT for brace application/alignment and OOB transfer.  Patient tolerating maneuvering with moderate pain on R side.  Able to flex L knee for initiating rolling and A for L arm on chest, but needs total A for pushing to roll.  Patient with impaired LE sensation below axillae and below mid forearms bilaterally.  Seems to feel a sensation of touch, but not able to localize.  Patient remains appropriate for CIR level rehab at d/c.  PT will continue to follow.   Recommendations for follow up therapy are one component of a multi-disciplinary discharge planning process, led by the attending physician.  Recommendations may be updated based on patient status, additional functional criteria and insurance authorization.  Follow Up Recommendations  CIR     Equipment Recommendations  Other (comment) (TBA)    Recommendations for Other Services       Precautions / Restrictions Precautions Precautions: Fall;Cervical;Back Required Braces or Orthoses: Other Brace Other Brace: CTO, can be donned and doffed in sitting; cervical collar on while in bed (per chat text with neuro NP)     Mobility  Bed Mobility Overal bed mobility: Needs Assistance Bed Mobility: Rolling;Sidelying to Sit Rolling: +2 for physical assistance;Total assist Sidelying to sit: Total assist;+2 for physical assistance       General bed mobility comments: Rolling: pt can A with bending up LLE (but has  difficulty maintaining leg bent up, can reach across body with LUE but not far enough to reach rail. Side-lying >sit: pt can initiate legs off of bed but not follow through; cannot help with trunk at all.    Transfers Overall transfer level: Needs assistance Equipment used: None Transfers: Lateral/Scoot Transfers          Lateral/Scoot Transfers: +2 physical assistance;Total assist General transfer comment: 3rd person was useful for lateral transfer, but could manage with +2  Ambulation/Gait                 Stairs             Wheelchair Mobility    Modified Rankin (Stroke Patients Only)       Balance Overall balance assessment: Needs assistance Sitting-balance support: Bilateral upper extremity supported;Feet supported Sitting balance-Leahy Scale: Zero Sitting balance - Comments: strong posterior lean when core engaged and needs faciliation for upright posture, posterior pelvic tilt in sitting                                    Cognition Arousal/Alertness: Awake/alert Behavior During Therapy: WFL for tasks assessed/performed Overall Cognitive Status: Within Functional Limits for tasks assessed                                        Exercises      General Comments        Pertinent Vitals/Pain Pain Assessment:  Faces Faces Pain Scale: Hurts even more Pain Location: right side with movement Pain Descriptors / Indicators: Sore;Aching;Discomfort Pain Intervention(s): Limited activity within patient's tolerance;Monitored during session;Repositioned    Home Living                      Prior Function            PT Goals (current goals can now be found in the care plan section) Progress towards PT goals: Progressing toward goals    Frequency    Min 4X/week      PT Plan Current plan remains appropriate    Co-evaluation PT/OT/SLP Co-Evaluation/Treatment: Yes Reason for Co-Treatment: Complexity of the  patient's impairments (multi-system involvement);For patient/therapist safety;To address functional/ADL transfers          AM-PAC PT "6 Clicks" Mobility   Outcome Measure  Help needed turning from your back to your side while in a flat bed without using bedrails?: Total Help needed moving from lying on your back to sitting on the side of a flat bed without using bedrails?: Total Help needed moving to and from a bed to a chair (including a wheelchair)?: Total Help needed standing up from a chair using your arms (e.g., wheelchair or bedside chair)?: Total Help needed to walk in hospital room?: Total Help needed climbing 3-5 steps with a railing? : Total 6 Click Score: 6    End of Session   Activity Tolerance: Patient tolerated treatment well Patient left: in chair;with call bell/phone within reach;with chair alarm set Nurse Communication: Mobility status;Other (comment) (taking off brace) PT Visit Diagnosis: Other abnormalities of gait and mobility (R26.89);History of falling (Z91.81);Muscle weakness (generalized) (M62.81);Pain Pain - Right/Left: Right Pain - part of body:  (side)     Time: 8850-2774 PT Time Calculation (min) (ACUTE ONLY): 49 min  Charges:  $Therapeutic Activity: 8-22 mins                     Sheran Lawless, PT Acute Rehabilitation Services [phone] Office:[phone] 04/16/2021    Julian Allen 04/16/2021, 5:07 PM

## 2021-04-16 NOTE — Progress Notes (Signed)
   Providing Compassionate, Quality Care - Together   Subjective: Patient with no new complaints this morning.  Objective: Vital signs in last 24 hours: Temp:  [98.2 F (36.8 C)-99.7 F (37.6 C)] 99.5 F (37.5 C) (10/07 0400) Pulse Rate:  [56-95] 62 (10/07 0700) Resp:  [8-31] 27 (10/07 0700) BP: (113-143)/(57-74) 135/70 (10/07 0700) SpO2:  [95 %-99 %] 98 % (10/07 0700) Weight:  [73.4 kg] 73.4 kg (10/07 0408)  Intake/Output from previous day: 10/06 0701 - 10/07 0700 In: 2300 [I.V.:2100; IV Piggyback:200] Out: 2350 [Urine:1800; Stool:550] Intake/Output this shift: No intake/output data recorded.  Alert and oriented PERRLA Speech clear, RA Wiggles fingers and toes. Able to raise BUE and left leg off the bed. Left side is stronger than right. Incisions are covered with Honeycomb dressings and Steri Strips Lumbar dressing changed by nurse. Nurse reports there was a small amount of serosanguinous drainage on dressing prior to changing. Cervical dressing changed with nurse during this assessment. Dressing saturated with darker sanguinous drainage. Surgical wound doesn't appear to be actively draining. Steri Strips removed during assessment. Mepilex dressing applied.  Lab Results: Recent Labs    04/15/21 0633 04/16/21 0302  WBC 9.3 7.2  HGB 9.4* 8.7*  HCT 27.8* 26.3*  PLT 155 174   BMET Recent Labs    04/15/21 0633 04/16/21 0302  NA 140 140  K 3.3* 3.6  CL 108 111  CO2 24 25  GLUCOSE 106* 112*  BUN 14 10  CREATININE 0.60* 0.58*  CALCIUM 8.1* 7.9*    Studies/Results: No results found.  Assessment/Plan: Patient sustained a chance type fracture at C2-3 with marked angulation and severe spinal canal stenosis and a severe fracture dislocation with distraction at L1-L2 following a fall from about 10'. Dr. Jordan Likes performed a C2-C3-C4 decompressive laminectomy, followed by a C2-3-4 posterior lateral fusion utilizing lateral mass instrumentation on 04/11/2021. The patient  is moving his hands and feet and confirms he has sensation in all extremities. CTA neck on 04/12/2021 negative. Extubated 04/14/2021.   LOS: 5 days   -Per Trauma Service, plan is to transfer patient out of the ICU today -CTO brace at the bedside. Patient donning the [place] J cervical collar while in bed. Continue to mobilize as tolerated. -Change dressings as needed to keep surgical sites dry.  Val Eagle, DNP, AGNP-C Nurse Practitioner  [facility] [address], [address], [place], [place] [address] P: [phone]    F: [phone]  04/16/2021, 8:22 AM
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
## 2021-04-16 NOTE — Progress Notes (Signed)
Patient ID: Julian Allen, male   DOB: 07-06-1981, 40 y.o.   MRN: [id number] Per NS OK to start home Imuran and Humira. I ordered the Imuran. I discussed the Humira with pharmacy and, if it is due (it is weekly), his family can bring it in so it can be given.   Violeta Gelinas, MD, MPH, FACS Please use [email or web address] to contact on call provider

## 2021-04-17 ENCOUNTER — Other Ambulatory Visit: Payer: Self-pay

## 2021-04-17 ENCOUNTER — Encounter (HOSPITAL_COMMUNITY): Payer: Self-pay

## 2021-04-17 MED ORDER — LEVETIRACETAM 500 MG PO TABS
500.0000 mg | ORAL_TABLET | Freq: Two times a day (BID) | ORAL | Status: DC
  Administered 2021-04-17: 500 mg via ORAL
  Filled 2021-04-17 (×2): qty 1

## 2021-04-17 NOTE — Progress Notes (Addendum)
Patient transferred room unit 4NICU to room 4NP06 at this time. Alert and in stable condition. Room oriented to and call bell within reach.

## 2021-04-17 NOTE — Progress Notes (Signed)
Patient ID: Julian Allen, male   DOB: 02/08/1981, 40 y.o.   MRN: [id number] 6 Days Post-Op   Subjective: States he is doing well. Has been taking the anxiety meds and is getting better. Had french toast and sausage for breakfast ROS negative except as listed above. Objective: Vital signs in last 24 hours: Temp:  [98.1 F (36.7 C)-99.7 F (37.6 C)] 99.4 F (37.4 C) (10/08 0400) Pulse Rate:  [57-71] 57 (10/08 0600) Resp:  [9-32] 17 (10/08 0600) BP: (126-141)/(67-71) 131/67 (10/08 0400) SpO2:  [94 %-97 %] 96 % (10/08 0600) Last BM Date: 04/16/21  Intake/Output from previous day: 10/07 0701 - 10/08 0700 In: 1370 [P.O.:720; I.V.:500; IV Piggyback:100] Out: 2085 [Urine:1785; Stool:300] Intake/Output this shift: Total I/O In: -  Out: 350 [Urine:350]  General appearance: cooperative Neck: collar Chest: cta b/l, symm chest rise Cardio: regular rate and rhythm GI: soft, NT Extremities: calves soft Neurologic: Mental status: Alert, oriented, thought content appropriate Motor: B grip and biceps L>R, lifts LLE, some movement RLE  Lab Results: CBC  Recent Labs    04/15/21 0633 04/16/21 0302  WBC 9.3 7.2  HGB 9.4* 8.7*  HCT 27.8* 26.3*  PLT 155 174    BMET Recent Labs    04/15/21 0633 04/16/21 0302  NA 140 140  K 3.3* 3.6  CL 108 111  CO2 24 25  GLUCOSE 106* 112*  BUN 14 10  CREATININE 0.60* 0.58*  CALCIUM 8.1* 7.9*    PT/INR No results for input(s): LABPROT, INR in the last 72 hours. ABG No results for input(s): PHART, HCO3 in the last 72 hours.  Invalid input(s): PCO2, PO2  Studies/Results: DG Lumbar Spine 2-3 Views  Result Date: 04/16/2021 CLINICAL DATA:  Status post fusion EXAM: LUMBAR SPINE - 2-3 VIEW COMPARISON:  04/11/2021, MRI 04/11/2021, CT 04/11/2021 FINDINGS: Interval posterior spinal fusion T12 through L3 with intact hardware. More normal appearance of the disc space at L1-L2 compared to prior CT. Mild superior endplate deformity at L1 with  sclerosis as before. IMPRESSION: Interval posterior spinal fusion T12 through L3 with intact appearing hardware and reduction at L1-L2. Electronically Signed   By: Jasmine Pang M.D.   On: 04/16/2021 16:32    Anti-infectives: Anti-infectives (From admission, onward)    Start     Dose/Rate Route Frequency Ordered Stop   04/11/21 1800  ceFAZolin (ANCEF) IVPB 2g/100 mL premix  Status:  Discontinued        2 g 200 mL/hr over 30 Minutes Intravenous Every 8 hours 04/11/21 1659 04/14/21 1606   04/11/21 1659  ceFAZolin (ANCEF) IVPB 2g/100 mL premix  Status:  Discontinued        2 g 200 mL/hr over 30 Minutes Intravenous 30 min pre-op 04/11/21 1700 04/11/21 1715   04/11/21 1255  vancomycin (VANCOCIN) powder  Status:  Discontinued          As needed 04/11/21 1310 04/11/21 1535       Assessment/Plan: Fall through ceiling  C2-3 Chance FX with severe stenosis and SCI - S/P decompressive laminectomy and C2-4 posterior lateral fusion by Dr. 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 Improving. VTE - PAS, LMWH  FEN - on D3 thins, D/C IVF Dispo - to 4NP, plan CIR  LOS: 6 days   Mary Sella. Andrey Campanile, MD, FACS General, Bariatric, & Minimally Invasive Surgery [facility], [place]   04/17/2021

## 2021-04-17 NOTE — Progress Notes (Signed)
   Providing Compassionate, Quality Care - Together  NEUROSURGERY PROGRESS NOTE   S: No issues overnight.  No new complaints this morning.  O: EXAM:  BP 131/67 (BP Location: Right Arm)   Pulse (!) 57   Temp 99.4 F (37.4 C) (Oral)   Resp 17   Ht 5\' 8"  (1.727 m)   Wt 73.4 kg   SpO2 96%   BMI 24.60 kg/m   Awake, alert, oriented  Speech fluent, appropriate  Bilateral upper extremity antigravity Left lower extremity antigravity, right lower extremity 2/5  ASSESSMENT:  40 y.o. male with   C2-3 fracture -Status post C2-4 decompression fusion  PLAN: -Continue therapies -Continue collar -Pain controlled    Thank you for allowing me to participate in this patient's care.  Please do not hesitate to call with questions or concerns.   45, DO Neurosurgeon [facility] Cell: [phone]

## 2021-04-18 MED ORDER — DOCUSATE SODIUM 50 MG/5ML PO LIQD
100.0000 mg | Freq: Two times a day (BID) | ORAL | Status: DC
  Administered 2021-04-20: 100 mg via ORAL
  Filled 2021-04-18 (×3): qty 10

## 2021-04-18 MED ORDER — LEVETIRACETAM 100 MG/ML PO SOLN
500.0000 mg | Freq: Two times a day (BID) | ORAL | Status: DC
  Administered 2021-04-18 – 2021-04-27 (×19): 500 mg via ORAL
  Filled 2021-04-18 (×19): qty 5

## 2021-04-18 NOTE — Progress Notes (Signed)
Patient ID: Julian Allen, male   DOB: Nov 13, 1980, 40 y.o.   MRN: [id number] 7 Days Post-Op   Subjective: States he is doing well. Has been taking the anxiety meds and is getting better. No c/o; currently eating with assistance ROS negative except as listed above. Objective: Vital signs in last 24 hours: Temp:  [97.8 F (36.6 C)-99.7 F (37.6 C)] 98.6 F (37 C) (10/09 0716) Pulse Rate:  [69-100] 88 (10/09 0716) Resp:  [20-23] 22 (10/09 0716) BP: (105-140)/(69-82) 105/74 (10/09 0716) SpO2:  [94 %-96 %] 96 % (10/09 0716) Last BM Date: 04/17/21  Intake/Output from previous day: 10/08 0701 - 10/09 0700 In: 30  Out: 1550 [Urine:1550] Intake/Output this shift: No intake/output data recorded.  General appearance: cooperative Neck: collar Chest: cta b/l, symm chest rise Cardio: regular rate and rhythm GI: soft, NT Extremities: calves soft Neurologic: Mental status: Alert, oriented, thought content appropriate Motor: B grip and biceps L>R, lifts LLE, some movement RLE  Lab Results: CBC  Recent Labs    04/16/21 0302  WBC 7.2  HGB 8.7*  HCT 26.3*  PLT 174    BMET Recent Labs    04/16/21 0302  NA 140  K 3.6  CL 111  CO2 25  GLUCOSE 112*  BUN 10  CREATININE 0.58*  CALCIUM 7.9*    PT/INR No results for input(s): LABPROT, INR in the last 72 hours. ABG No results for input(s): PHART, HCO3 in the last 72 hours.  Invalid input(s): PCO2, PO2  Studies/Results: DG Lumbar Spine 2-3 Views  Result Date: 04/16/2021 CLINICAL DATA:  Status post fusion EXAM: LUMBAR SPINE - 2-3 VIEW COMPARISON:  04/11/2021, MRI 04/11/2021, CT 04/11/2021 FINDINGS: Interval posterior spinal fusion T12 through L3 with intact hardware. More normal appearance of the disc space at L1-L2 compared to prior CT. Mild superior endplate deformity at L1 with sclerosis as before. IMPRESSION: Interval posterior spinal fusion T12 through L3 with intact appearing hardware and reduction at L1-L2. Electronically  Signed   By: Jasmine Pang M.D.   On: 04/16/2021 16:32    Anti-infectives: Anti-infectives (From admission, onward)    Start     Dose/Rate Route Frequency Ordered Stop   04/11/21 1800  ceFAZolin (ANCEF) IVPB 2g/100 mL premix  Status:  Discontinued        2 g 200 mL/hr over 30 Minutes Intravenous Every 8 hours 04/11/21 1659 04/14/21 1606   04/11/21 1659  ceFAZolin (ANCEF) IVPB 2g/100 mL premix  Status:  Discontinued        2 g 200 mL/hr over 30 Minutes Intravenous 30 min pre-op 04/11/21 1700 04/11/21 1715   04/11/21 1255  vancomycin (VANCOCIN) powder  Status:  Discontinued          As needed 04/11/21 1310 04/11/21 1535       Assessment/Plan: Fall through ceiling  C2-3 Chance FX with severe stenosis and SCI - S/P decompressive laminectomy and C2-4 posterior lateral fusion by Dr. 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 Improving.  VTE - PAS, LMWH  FEN - on D3 thins, D/C IVF Dispo -, plan CIR  LOS: 7 days   Mary Sella. Andrey Campanile, MD, FACS General, Bariatric, & Minimally Invasive Surgery [facility], [place]   04/18/2021

## 2021-04-18 NOTE — Progress Notes (Signed)
   Providing Compassionate, Quality Care - Together  NEUROSURGERY PROGRESS NOTE   S: No issues overnight. Sitting up, being fed  O: EXAM:  BP 105/74 (BP Location: Left Arm)   Pulse 88   Temp 98.6 F (37 C) (Oral)   Resp (!) 22   Ht 5\' 8"  (1.727 m)   Wt 73.4 kg   SpO2 96%   BMI 24.60 kg/m   Awake, alert, oriented  Speech fluent, appropriate  Bilateral upper extremity antigravity Left lower extremity antigravity, right lower extremity 2/5   ASSESSMENT:  40 y.o. male with    C2-3 fracture -Status post C2-4 decompression fusion   PLAN: -Continue therapies -Continue collar -Pain controlled      Thank you for allowing me to participate in this patient's care.  Please do not hesitate to call with questions or concerns.   45, DO Neurosurgeon [facility] Cell: [phone]

## 2021-04-19 NOTE — TOC Progression Note (Signed)
Transition of Care [facility]) - Progression Note    Patient Details  Name: Kole Hilyard MRN: [id number] Date of Birth: 05-Mar-1981  Transition of Care [facility]) CM/SW Contact  Glennon Mac, RN Phone Number: 04/19/2021, 1:54 PM  Clinical Narrative:    Received notification that patient's insurance is contracted with [facility], inpatient has been denied by both [facility] facilities.  We will plan to fax referral to [facility] inpatient rehab, fax number [phone].  Updated patient on insurance issues with inpatient rehab, and he is agreeable to look at [facility] for rehab.  Should [facility] deny patient, may be able to consider single case agreement with insurance for [facility] CIR.   Expected Discharge Plan: IP Rehab Facility Barriers to Discharge: Continued Medical Work up  Expected Discharge Plan and Services Expected Discharge Plan: IP Rehab Facility   Discharge Planning Services: CM Consult                                           Social Determinants of Health (SDOH) Interventions    Readmission Risk Interventions No flowsheet data found.  Quintella Baton, RN, BSN  Trauma/Neuro ICU Case Manager [phone]

## 2021-04-19 NOTE — Progress Notes (Signed)
Patient ID: Julian Allen, male   DOB: May 05, 1981, 40 y.o.   MRN: [id number] 8 Days Post-Op   Subjective: No complaints this AM  Objective: Vital signs in last 24 hours: Temp:  [97.9 F (36.6 C)-99.1 F (37.3 C)] 98.7 F (37.1 C) (10/10 0326) Pulse Rate:  [90-110] 90 (10/10 0326) Resp:  [20-31] 20 (10/10 0326) BP: (109-136)/(61-81) 109/65 (10/10 0326) SpO2:  [93 %-96 %] 94 % (10/10 0326) Last BM Date: 04/18/21  Intake/Output from previous day: 10/09 0701 - 10/10 0700 In: 720 [P.O.:720] Out: 1250 [Urine:1250] Intake/Output this shift: No intake/output data recorded.  General appearance: cooperative Neck: collar Chest: cta b/l, symm chest rise Cardio: regular rate and rhythm GI: soft, NT Extremities: calves soft Neurologic: Mental status: Alert, oriented, thought content appropriate Motor: B grip and biceps L>R, lifts LLE, some movement RLE  Lab Results: CBC  No results for input(s): WBC, HGB, HCT, PLT in the last 72 hours.  BMET No results for input(s): NA, K, CL, CO2, GLUCOSE, BUN, CREATININE, CALCIUM in the last 72 hours.  PT/INR No results for input(s): LABPROT, INR in the last 72 hours. ABG No results for input(s): PHART, HCO3 in the last 72 hours.  Invalid input(s): PCO2, PO2  Studies/Results: No results found.  Anti-infectives: Anti-infectives (From admission, onward)    Start     Dose/Rate Route Frequency Ordered Stop   04/11/21 1800  ceFAZolin (ANCEF) IVPB 2g/100 mL premix  Status:  Discontinued        2 g 200 mL/hr over 30 Minutes Intravenous Every 8 hours 04/11/21 1659 04/14/21 1606   04/11/21 1659  ceFAZolin (ANCEF) IVPB 2g/100 mL premix  Status:  Discontinued        2 g 200 mL/hr over 30 Minutes Intravenous 30 min pre-op 04/11/21 1700 04/11/21 1715   04/11/21 1255  vancomycin (VANCOCIN) powder  Status:  Discontinued          As needed 04/11/21 1310 04/11/21 1535       Assessment/Plan: Fall through ceiling  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 Improving. VTE - PAS, LMWH  FEN - on D3 thins, D/C IVF Dispo - ready for discharge to CIR  LOS: 8 days   Quentin Ore, MD  General, Bariatric, & Minimally Invasive Surgery [facility], [place]   04/19/2021

## 2021-04-19 NOTE — TOC Initial Note (Signed)
Transition of Care [facility]) - Initial/Assessment Note    Patient Details  Name: Julian Allen MRN: [id number] Date of Birth: 1980-11-18  Transition of Care [facility]) CM/SW Contact:    Ella Bodo, RN Phone Number: 04/19/2021, 12:18 PM  Clinical Narrative:                 40 yo male fall through ceiling landing on exercise equipment. intubated 10/2 C2-3 fx with canal stenosis with cord compression and prevertebral hematoma L1-2 fx dislocation 10/2 PLIF C2-4 L1-2 Decompression laminectomy with reduction of fx T12 L1 L2 L3 PLA with pedicle screw fixation and local autografting. PTA, pt independent and living at home with mother.  His father is at bedside.  Met with pt and father to discuss options for rehab; pt's insurance is out of network for [place]; we discussed sending referrals to [facility] in [place], [place], and patient/family agreeable to this. Will send referrals out to area IP Rehabs, and provide updates as available.    Expected Discharge Plan: IP Rehab Facility Barriers to Discharge: Continued Medical Work up   Patient Goals and CMS Choice Patient states their goals for this hospitalization and ongoing recovery are:: to get better CMS Medicare.gov Compare Post Acute Care list provided to:: Patient Choice offered to / list presented to : Patient, Parent  Expected Discharge Plan and Services Expected Discharge Plan: [place]   Discharge Planning Services: CM Consult                                          Prior Living Arrangements/Services   Lives with:: Parents Patient language and need for interpreter reviewed:: Yes Do you feel safe going back to the place where you live?: Yes      Need for Family Participation in Patient Care: Yes (Comment) Care giver support system in place?: Yes (comment)   Criminal Activity/Legal Involvement Pertinent to Current Situation/Hospitalization: No - Comment as needed  Activities of Daily  Living Home Assistive Devices/Equipment: None ADL Screening (condition at time of admission) Patient's cognitive ability adequate to safely complete daily activities?: Yes Is the patient deaf or have difficulty hearing?: No Does the patient have difficulty seeing, even when wearing glasses/contacts?: No Does the patient have difficulty concentrating, remembering, or making decisions?: No Patient able to express need for assistance with ADLs?: No Does the patient have difficulty dressing or bathing?: Yes Independently performs ADLs?: No Communication: Dependent Is this a change from baseline?: Change from baseline, expected to last >3 days Dressing (OT): Dependent Is this a change from baseline?: Change from baseline, expected to last >3 days Grooming: Dependent Is this a change from baseline?: Change from baseline, expected to last >3 days Feeding: Dependent Is this a change from baseline?: Change from baseline, expected to last >3 days Bathing: Dependent Is this a change from baseline?: Change from baseline, expected to last >3 days Toileting: Dependent Is this a change from baseline?: Change from baseline, expected to last >3days In/Out Bed: Dependent Is this a change from baseline?: Change from baseline, expected to last >3 days Walks in Home: Dependent Is this a change from baseline?: Change from baseline, expected to last >3 days Does the patient have difficulty walking or climbing stairs?: Yes Weakness of Legs: Right Weakness of Arms/Hands: Both  Permission Sought/Granted  Emotional Assessment Appearance:: Appears stated age Attitude/Demeanor/Rapport: Engaged Affect (typically observed): Accepting Orientation: : Oriented to Self, Oriented to Place, Oriented to  Time, Oriented to Situation      Admission diagnosis:  Trauma [T14.90XA] 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  JReinaldo Raddle RN, BSN  Trauma/Neuro ICU Case Manager [phone]

## 2021-04-19 NOTE — Progress Notes (Signed)
   Providing Compassionate, Quality Care - Together   Subjective: Patient reports no issues overnight. Feels his strength and coordination are improving.  Objective: Vital signs in last 24 hours: Temp:  [97.9 F (36.6 C)-99.1 F (37.3 C)] 98.7 F (37.1 C) (10/10 0326) Pulse Rate:  [90-110] 90 (10/10 0326) Resp:  [20-31] 20 (10/10 0326) BP: (109-136)/(61-81) 109/65 (10/10 0326) SpO2:  [94 %-96 %] 94 % (10/10 0326)  Intake/Output from previous day: 10/09 0701 - 10/10 0700 In: 720 [P.O.:720] Out: 1250 [Urine:1250] Intake/Output this shift: Total I/O In: 600 [P.O.:600] Out: 350 [Urine:350]  Alert and oriented PERRLA Speech clear, RA Dexterity much improved in hands. Able to raise BUE and left leg off the bed. LLE > RLE Lumbar incision is covered with Honeycomb dressing and Steri Strips, clean, dry, and intact Posterior cervical incision is covered with Mepilex dressing; Incision is clean, dry, and intact Hard cervical collar in place Foley catheter in place for urinary retention Flexi-seal  Lab Results: No results for input(s): WBC, HGB, HCT, PLT in the last 72 hours. BMET No results for input(s): NA, K, CL, CO2, GLUCOSE, BUN, CREATININE, CALCIUM in the last 72 hours.  Studies/Results: No results found.  Assessment/Plan: Patient sustained a chance type fracture at C2-3 with marked angulation and severe spinal canal stenosis and a severe fracture dislocation with distraction at L1-L2 following a fall from about 10'. Dr. Jordan Likes performed a C2-C3-C4 decompressive laminectomy, followed by a C2-3-4 posterior lateral fusion utilizing lateral mass instrumentation on 04/11/2021. The patient is moving his hands and feet and confirms he has sensation in all extremities. CTA neck on 04/12/2021 negative. Extubated 04/14/2021.    LOS: 8 days   -Case Manager is looking into [facility] inpatient rehab dur to insurance -CTO brace at the bedside. Patient donning the [place] cervical collar while in  bed. Continue to mobilize as tolerated. -Change dressings as needed to keep surgical sites dry.  Val Eagle, DNP, AGNP-C Nurse Practitioner  [facility] [address], [place], [place] [address] P: [phone]    F: [phone]  04/19/2021, 2:12 PM

## 2021-04-19 NOTE — Evaluation (Signed)
Physical Therapy Evaluation Patient Details Name: Julian Allen MRN: [id number] DOB: Sep 11, 1980 Today's Date: 04/19/2021  History of Present Illness  40 yo male fall through ceiling landing on exercise equipment. intubated 10/2 C2-3 fx with canal stenosis with cord compression and prevertebral hematoma L1-2 fx dislocation 10/2 PLIF C2-4 L1-2 Decompression laminectomy with reduction of fx T12 L1 L2 L3 PLA with pedicle screw fixation and local autografting, PMH seizure, crohns disease  Clinical Impression  Pt tolerated a significant amount of mobility today, first, multiple rolls for peri care, bed and gown clean up and donning of CTO (I was alone and was not confident I could do it solo with dad's help in sitting EOB).  Then we sat EOB for 20 mins working on weight shifting, reaching, sitting tolerance, serial BPs, down to bil elbows and back up, kicking, reaching.  Then it was back to bed to get onto the lift pad for OOB mobility.  Unfortunately, his lunch tray never came up and once positioned in the recliner chair the CTO prevented him from opening his mouth wide enough to eat, so back to bed with the total lift to get him in the other cervical collar and positioned to eat.  It was an hour and a half session and he is tired and painful. Rn in room giving him pain meds.     Recommendations for follow up therapy are one component of a multi-disciplinary discharge planning process, led by the attending physician.  Recommendations may be updated based on patient status, additional functional criteria and insurance authorization.  Follow Up Recommendations CIR    Equipment Recommendations  Wheelchair (measurements PT);Wheelchair cushion (measurements PT);Hospital bed (18x18 tilt in space)    Recommendations for Other Services       Precautions / Restrictions Precautions Precautions: Fall;Cervical;Back Required Braces or Orthoses: Other Brace Other Brace: CTO, can be donned and doffed in sitting;  cervical collar on while in bed (per chat text with neuro NP)      Mobility  Bed Mobility Overal bed mobility: Needs Assistance Bed Mobility: Rolling;Sidelying to Sit;Sit to Sidelying Rolling: Max assist Sidelying to sit: Max assist     Sit to sidelying: Max assist General bed mobility comments: Max assist to roll using elbow hooking, pt able to help flex knee up and attempts to hook bedrail once in sidelying. Assist needed to progress bil legs over EOB and support trunk to come up to sitting EOB.  Once sitting, pt attempting to prop with arms, tripod was too painful to sustain.    Transfers       Sit to Stand: Total assist         General transfer comment: Used maxi move lift for OOB to chair, could not remain in chair >10 mins as he had not eaten all day and in sitting the CTO was preventing him from opening his mouth.  Ambulation/Gait                Stairs            Wheelchair Mobility    Modified Rankin (Stroke Patients Only)       Balance Overall balance assessment: Needs assistance Sitting-balance support: Feet supported;Bilateral upper extremity supported Sitting balance-Leahy Scale: Zero Sitting balance - Comments: max assist EOB and total assist as he fatigued, serial BPs were stable to high, HR in the 130s. Postural control: Posterior lean  Pertinent Vitals/Pain Pain Assessment: Faces Faces Pain Scale: Hurts whole lot Pain Location: back, shoulders Pain Descriptors / Indicators: Sore;Aching;Discomfort Pain Intervention(s): Limited activity within patient's tolerance;Monitored during session;Repositioned    Home Living                        Prior Function                 Hand Dominance        Extremity/Trunk Assessment                Communication      Cognition Arousal/Alertness: Awake/alert Behavior During Therapy: WFL for tasks assessed/performed Overall  Cognitive Status: Within Functional Limits for tasks assessed                                        General Comments      Exercises     Assessment/Plan    PT Assessment    PT Problem List         PT Treatment Interventions      PT Goals (Current goals can be found in the Care Plan section)  Acute Rehab PT Goals Patient Stated Goal: get stronger    Frequency Min 4X/week   Barriers to discharge        Co-evaluation               AM-PAC PT "6 Clicks" Mobility  Outcome Measure Help needed turning from your back to your side while in a flat bed without using bedrails?: Total Help needed moving from lying on your back to sitting on the side of a flat bed without using bedrails?: Total Help needed moving to and from a bed to a chair (including a wheelchair)?: Total Help needed standing up from a chair using your arms (e.g., wheelchair or bedside chair)?: Total Help needed to walk in hospital room?: Total Help needed climbing 3-5 steps with a railing? : Total 6 Click Score: 6    End of Session   Activity Tolerance: Patient limited by pain;Patient limited by fatigue Patient left: in bed;with family/visitor present Nurse Communication: Mobility status;Need for lift equipment PT Visit Diagnosis: Other abnormalities of gait and mobility (R26.89);History of falling (Z91.81);Muscle weakness (generalized) (M62.81);Pain Pain - Right/Left: Right Pain - part of body:  (neck back)    Time: 1623-1800 PT Time Calculation (min) (ACUTE ONLY): 97 min   Charges:     PT Treatments $Therapeutic Activity: 68-82 mins        Corinna Capra, PT, DPT  Acute Rehabilitation Ortho Tech Supervisor [phone] pager [phone]) [phone] office

## 2021-04-20 LAB — URINALYSIS, ROUTINE W REFLEX MICROSCOPIC

## 2021-04-20 LAB — BASIC METABOLIC PANEL
Anion gap: 7 (ref 5–15)
BUN: 16 mg/dL (ref 6–20)
CO2: 28 mmol/L (ref 22–32)
Calcium: 8.8 mg/dL — ABNORMAL LOW (ref 8.9–10.3)
Chloride: 100 mmol/L (ref 98–111)
Creatinine, Ser: 0.64 mg/dL (ref 0.61–1.24)
GFR, Estimated: >60 mL/min (ref >60)
Glucose, Bld: 122 mg/dL — ABNORMAL HIGH (ref 70–99)
Potassium: 4.2 mmol/L (ref 3.5–5.1)
Sodium: 135 mmol/L (ref 135–145)

## 2021-04-20 LAB — CBC
HCT: 31.3 % — ABNORMAL LOW (ref 39.0–52.0)
Hemoglobin: 10 g/dL — ABNORMAL LOW (ref 13.0–17.0)
MCH: 30.8 pg (ref 26.0–34.0)
MCHC: 31.9 g/dL (ref 30.0–36.0)
MCV: 96.3 fL (ref 80.0–100.0)
Platelets: 359 10*3/uL (ref 150–400)
RBC: 3.25 MIL/uL — ABNORMAL LOW (ref 4.22–5.81)
RDW: 15.3 % (ref 11.5–15.5)
WBC: 11.5 10*3/uL — ABNORMAL HIGH (ref 4.0–10.5)
nRBC: 0 % (ref 0.0–0.2)

## 2021-04-20 LAB — URINALYSIS, MICROSCOPIC (REFLEX)
RBC / HPF: >50 RBC/hpf (ref 0–5)
Squamous Epithelial / HPF: NONE SEEN (ref 0–5)

## 2021-04-20 MED ORDER — NUTRISOURCE FIBER PO PACK
1.0000 | PACK | Freq: Two times a day (BID) | ORAL | Status: DC
  Administered 2021-04-20 (×2): 1
  Filled 2021-04-20 (×4): qty 1

## 2021-04-20 MED ORDER — LOPERAMIDE HCL 2 MG PO CAPS
2.0000 mg | ORAL_CAPSULE | Freq: Every day | ORAL | Status: DC
  Administered 2021-04-20 – 2021-04-27 (×8): 2 mg via ORAL
  Filled 2021-04-20 (×8): qty 1

## 2021-04-20 MED ORDER — DOCUSATE SODIUM 50 MG/5ML PO LIQD
100.0000 mg | Freq: Every day | ORAL | Status: DC
  Administered 2021-04-21: 100 mg via ORAL
  Filled 2021-04-20 (×6): qty 10

## 2021-04-20 NOTE — Progress Notes (Signed)
PT Cancellation Note  Patient Details Name: Julian Allen MRN: [id number] DOB: [birth year]   Cancelled Treatment:    Reason Eval/Treat Not Completed: Other (comment).  Pt just returned to bed with RN staff after being up 2 hours post OT session.  PT assisted in removing CTO and applying in bed cervical collar as well as positioning pt.  He is agreeable for me to check back later as time allows.   Thanks,  Corinna Capra, PT, DPT  Acute Rehabilitation Ortho Tech Supervisor [phone] pager #[phone] office      Julian Allen 04/20/2021, 11:50 AM

## 2021-04-20 NOTE — TOC Progression Note (Signed)
Transition of Care [facility]) - Progression Note    Patient Details  Name: Radames Mejorado MRN: [id number] Date of Birth: 1981/06/07  Transition of Care [facility]) CM/SW Contact  Glennon Mac, RN Phone Number: 04/20/2021, 11:55 AM  Clinical Narrative:    Received call from [facility] Admissions requesting updated clinical information.  Last MD note, PT/OT notes faxed to [phone].   Expected Discharge Plan: IP Rehab Facility Barriers to Discharge: Continued Medical Work up  Expected Discharge Plan and Services Expected Discharge Plan: IP Rehab Facility   Discharge Planning Services: CM Consult                                           Social Determinants of Health (SDOH) Interventions    Readmission Risk Interventions No flowsheet data found.  Quintella Baton, RN, BSN  Trauma/Neuro ICU Case Manager [phone]

## 2021-04-20 NOTE — Progress Notes (Signed)
   Trauma/Critical Care Follow Up Note  Subjective:    Overnight Issues:   Objective:  Vital signs for last 24 hours: Temp:  [98 F (36.7 C)-98.4 F (36.9 C)] 98.2 F (36.8 C) (10/11 0500) Pulse Rate:  [107-128] 107 (10/11 0500) Resp:  [20-31] 27 (10/11 0500) BP: (110-133)/(63-80) 110/63 (10/11 0500) SpO2:  [91 %-99 %] 91 % (10/11 0500)  Hemodynamic parameters for last 24 hours:    Intake/Output from previous day: 10/10 0701 - 10/11 0700 In: 1080 [P.O.:1080] Out: 1970 [Urine:1870; Stool:100]  Intake/Output this shift: Total I/O In: 480 [P.O.:480] Out: 600 [Urine:600]  Vent settings for last 24 hours:    Physical Exam:  Gen: comfortable, no distress Neuro: non-focal exam, stable weakness of extremities HEENT: PERRL Neck: supple CV: RRR Pulm: unlabored breathing Abd: soft, NT, rectal tube GU: bloody urine, foley Extr: wwp, no edema   No results found for this or any previous visit (from the past 24 hour(s)).  Assessment & Plan:  Present on Admission:  Fall, initial encounter    LOS: 9 days   Additional comments:I reviewed the patient's new clinical lab test results.   and I reviewed the patients new imaging test results.    Fall through ceiling   C2-3 Chance FX with severe stenosis and SCI - S/P decompressive laminectomy and C2-4 posterior lateral fusion by Dr. 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 Improving. VTE - PAS, cont LMWH  FEN - on D3 thins, add imodium and fiber Dispo - ready for discharge to CIR   Diamantina Monks, MD Trauma & General Surgery Please use [email or web address] to contact on call provider  04/20/2021  *Care during the described time interval was provided by me. I have reviewed this patient's available data, including medical history, events of note, physical examination and test results as part of my evaluation.

## 2021-04-20 NOTE — Progress Notes (Signed)
Occupational Therapy Treatment Patient Details Name: Julian Allen MRN: [id number] DOB: [birth year] Today's Date: 04/20/2021   History of present illness 40 yo male fall through ceiling landing on exercise equipment. intubated 10/2 C2-3 fx with canal stenosis with cord compression and prevertebral hematoma L1-2 fx dislocation 10/2 PLIF C2-4 L1-2 Decompression laminectomy with reduction of fx T12 L1 L2 L3 PLA with pedicle screw fixation and local autografting, PMH seizure, crohns disease   OT comments  Pt progressed with self feeding this session using red foam and mug with long straw. Pt motivated to get OOB. Pt requires two person to don brace in sitting. Pt upright in chair this session with call bell and mug to drink. Recommendation remains CIR.    Recommendations for follow up therapy are one component of a multi-disciplinary discharge planning process, led by the attending physician.  Recommendations may be updated based on patient status, additional functional criteria and insurance authorization.    Follow Up Recommendations  CIR;Supervision/Assistance - 24 hour    Equipment Recommendations  3 in 1 bedside commode;Wheelchair (measurements OT);Wheelchair cushion (measurements OT);Hospital bed    Recommendations for Other Services Rehab consult    Precautions / Restrictions Precautions Precautions: Fall;Cervical;Back Precaution Comments: watch BP, flexiseal , foley Required Braces or Orthoses: Other Brace Other Brace: CTO, can be donned and doffed in sitting; cervical collar on while in bed (per chat text with neuro NP)       Mobility Bed Mobility Overal bed mobility: Needs Assistance Bed Mobility: Rolling Rolling: Max assist         General bed mobility comments: pt rolling R and L x2 for hygiene and pad placement. the bed was increased to long sitting to apply brace due to poor attempt to log roll into the brace.    Transfers                 General transfer  comment: hoyer lift from bed to chair this session with floor tech (A). due to extensive time spend on hygiene and working on AE hoyer lift to chair this session. pt requires close guarding in maximove due to anterior lob toward machine in sling .    Balance Overall balance assessment: Needs assistance                                         ADL either performed or assessed with clinical judgement   ADL Overall ADL's : Needs assistance/impaired Eating/Feeding: Minimal assistance;Bed level Eating/Feeding Details (indicate cue type and reason): educated on advocating for repositioning higher in the bed for reposition prior to self feeding. provided mug with long straw to allow self drinking. pt demonstrates and reports "this is working" Grooming: Oral care;Set up Grooming Details (indicate cue type and reason): brushing teeth with backward chain of paste on brush, brush in red foam and handed to patient.                               General ADL Comments: pt demonstrates L knee flexion to (A) with log roll toward the R. pt reaching with L UE. pt requires increased (A) to the L side due to R weakness. Pt educated on advocating for OOB times and repositioning. pt with redness on sacrum area and stool leaking from flexiseal     Vision  Perception     Praxis      Cognition Arousal/Alertness: Awake/alert Behavior During Therapy: WFL for tasks assessed/performed Overall Cognitive Status: Within Functional Limits for tasks assessed                                          Exercises     Shoulder Instructions       General Comments HR 122 on arrival. pt sustain HR 122-124 during session. pt did decrease HR 112 while side lying in bed. Pt noticeable red urine out put in foley. provided geo mat for chair for sacrum protection    Pertinent Vitals/ Pain       Pain Assessment: Faces Faces Pain Scale: Hurts little more Pain Location:  back shoulders Pain Descriptors / Indicators: Sore;Aching;Discomfort Pain Intervention(s): Monitored during session;Repositioned  Home Living                                          Prior Functioning/Environment              Frequency  Min 3X/week        Progress Toward Goals  OT Goals(current goals can now be found in the care plan section)  Progress towards OT goals: Progressing toward goals  Acute Rehab OT Goals Patient Stated Goal: to do whatever it takes OT Goal Formulation: With patient Time For Goal Achievement: 04/27/21 Potential to Achieve Goals: Good ADL Goals Pt Will Perform Eating: with min assist;with adaptive utensils;with assist to don/doff brace/orthosis;sitting;bed level Pt Will Perform Grooming: with mod assist;sitting;with adaptive equipment Pt Will Transfer to Toilet: with mod assist;with transfer board;bedside commode Pt/caregiver will Perform Home Exercise Program: Increased ROM;Increased strength;Both right and left upper extremity;Independently;With written HEP provided Additional ADL Goal #1: pt will tolerate EOB for 10 minutes stable vital signs as precursor to adls. Additional ADL Goal #2: Pt will demonstrate shoulder retraction x10 reps min (A) Additional ADL Goal #3: Pt will be able to sit EOB working on sitting balance with no more than min A for balance.  Plan Discharge plan remains appropriate    Co-evaluation                 AM-PAC OT "6 Clicks" Daily Activity     Outcome Measure   Help from another person eating meals?: A Little Help from another person taking care of personal grooming?: A Lot Help from another person toileting, which includes using toliet, bedpan, or urinal?: Total Help from another person bathing (including washing, rinsing, drying)?: A Lot Help from another person to put on and taking off regular upper body clothing?: A Lot Help from another person to put on and taking off regular lower  body clothing?: Total 6 Click Score: 11    End of Session Equipment Utilized During Treatment: Back brace;Cervical collar  OT Visit Diagnosis: Other abnormalities of gait and mobility (R26.89);Muscle weakness (generalized) (M62.81);Pain Pain - Right/Left: Right   Activity Tolerance Patient tolerated treatment well   Patient Left in chair;with call bell/phone within reach;with chair alarm set   Nurse Communication Mobility status;Precautions        Time: 8099-8338 OT Time Calculation (min): 66 min  Charges: OT General Charges $OT Visit: 1 Visit OT Treatments $Self Care/Home Management : 53-67 mins   Brynn, OTR/L  Acute Rehabilitation Services Pager: [phone] Office: [phone] .   Mateo Flow 04/20/2021, 9:24 AM

## 2021-04-20 NOTE — Progress Notes (Addendum)
  [facility] notified of patient's bloody urine this morning. Pt had good urinary output. No new orders.

## 2021-04-20 NOTE — Progress Notes (Signed)
Physical Therapy Treatment Patient Details Name: Julian Allen MRN: [id number] DOB: [birth year] Today's Date: 04/20/2021   History of Present Illness 40 yo male fall through ceiling landing on exercise equipment. intubated 10/2 C2-3 fx with canal stenosis with cord compression and prevertebral hematoma L1-2 fx dislocation 10/2 PLIF C2-4 L1-2 Decompression laminectomy with reduction of fx T12 L1 L2 L3 PLA with pedicle screw fixation and local autografting, PMH seizure, crohns disease    PT Comments    Pt tolerated another hour long session with me today working on remembering how he can actively help in rolling and coming back to supine, sitting EOB >20 mins working on sitting balance, trunk perturbations and transitions.  Pt was up 2 hours after OT session this AM for OOB time and is now exhausted after PT session this PM.  He is making great gains and seems to do a little more every day to help himself. Positioned off of his bottom.  We will need to keep an eye out on his skin as his rectal tube leaks profusely and he will easily develop a pressure wound.  We may be close to needing an air bed (which will make sitting EOB quite difficult).  PT will continue to follow acutely for safe mobility progression.   Recommendations for follow up therapy are one component of a multi-disciplinary discharge planning process, led by the attending physician.  Recommendations may be updated based on patient status, additional functional criteria and insurance authorization.  Follow Up Recommendations  CIR     Equipment Recommendations  Wheelchair (measurements PT);Wheelchair cushion (measurements PT);Hospital bed (18 x18 tilt in space)    Recommendations for Other Services       Precautions / Restrictions Precautions Precautions: Fall;Cervical;Back Precaution Comments: watch BP, flexiseal , foley Required Braces or Orthoses: Other Brace Other Brace: CTO, can be donned and doffed in sitting; cervical  collar on while in bed (per chat text with neuro NP)     Mobility  Bed Mobility Overal bed mobility: Needs Assistance Bed Mobility: Rolling Rolling: Max assist Sidelying to sit: Max assist     Sit to sidelying: Max assist General bed mobility comments: Pt able to start to remember steps to help roll, take shoulder across, bend knee and take it across, assist with gripping rail once in sidelying.  Externally rotate hip and elbow back when coming back on his back. Flexi seal is still leaking profusely, so bed had to be changed, so we rolled at least 6 times both ways.  I also donned his CTO in supine because I did not feel I could do it safely once sitting with just my assist. working on sliding legs over EOB active assist, difficult to come up on elbow, so trunk was near total assist to come up to sitting, but max assit to return as we have been practicing down on left elbow with right arm in front to help catch his body.    Transfers                 General transfer comment: Pt was OOB in chair this AM for 2 hours and is exhausted.  He did not feel he could try again. I talked to him about getting a better WC to sit up in that is more supportive and that hopefully it will help his sitting tolerance and comfort.  Ambulation/Gait                 Stairs  Wheelchair Mobility    Modified Rankin (Stroke Patients Only)       Balance Overall balance assessment: Needs assistance Sitting-balance support: Feet supported;Bilateral upper extremity supported;Single extremity supported;No upper extremity supported Sitting balance-Leahy Scale: Zero Sitting balance - Comments: max to total assist EOB depending on what we were doing.  Sat EOB for >20 mins HR max observed 137.  Worked on leaning to both elbows and coming back up, anterior/posterior leans, shoulder shrugs, singular AA scapular retractions, bil scapular retractions, seated LAQs, seated toe and heel  raises. Postural control: Posterior lean                                  Cognition Arousal/Alertness: Awake/alert Behavior During Therapy: WFL for tasks assessed/performed Overall Cognitive Status: Within Functional Limits for tasks assessed                                 General Comments: Pt reports he does not remember the events surrounding his accident.      Exercises General Exercises - Lower Extremity Long Arc Quad: AROM;AAROM;Left;Right;10 reps;Seated Toe Raises: AROM;AAROM;Left;Right;10 reps Other Exercises Other Exercises: unilateral scap retractions AA by supporting the arm/forearm x10 each bil in sitting EOB, shoulder shrugs bil x 10 each seated, bil shoulder scapular retractions x 10 each seated EOB.    General Comments General comments (skin integrity, edema, etc.): bottom is a bit pink, so left him on his left side at end of session to help decrease pressure on his bottom, heels floated.      Pertinent Vitals/Pain Pain Assessment: Faces Faces Pain Scale: Hurts whole lot Pain Location: back, shoulders bil Pain Descriptors / Indicators: Sore;Aching;Discomfort Pain Intervention(s): Limited activity within patient's tolerance;Monitored during session;Repositioned;RN gave pain meds during session    Home Living                      Prior Function            PT Goals (current goals can now be found in the care plan section) Progress towards PT goals: Progressing toward goals    Frequency    Min 4X/week      PT Plan Current plan remains appropriate    Co-evaluation              AM-PAC PT "6 Clicks" Mobility   Outcome Measure  Help needed turning from your back to your side while in a flat bed without using bedrails?: Total Help needed moving from lying on your back to sitting on the side of a flat bed without using bedrails?: Total Help needed moving to and from a bed to a chair (including a wheelchair)?:  Total Help needed standing up from a chair using your arms (e.g., wheelchair or bedside chair)?: Total Help needed to walk in hospital room?: Total Help needed climbing 3-5 steps with a railing? : Total 6 Click Score: 6    End of Session Equipment Utilized During Treatment: Cervical collar Activity Tolerance: No increased pain;Patient limited by fatigue Patient left: in bed;with call bell/phone within reach   PT Visit Diagnosis: Other abnormalities of gait and mobility (R26.89);History of falling (Z91.81);Muscle weakness (generalized) (M62.81);Pain Pain - Right/Left:  (bil shoulders) Pain - part of body:  (mid back bil shoulders)     Time: 9741-6384 PT Time Calculation (min) (ACUTE ONLY): 58 min  Charges:  $Therapeutic Activity: 53-67 mins                     Corinna Capra, PT, DPT  Acute Rehabilitation Ortho Tech Supervisor [phone] pager [phone] office

## 2021-04-20 NOTE — Progress Notes (Signed)
   Providing Compassionate, Quality Care - Together   Subjective: Patient reports pain in lower back with sitting up to chair. This is his first time attempting to sit up in the chair for an extended period of time.  Objective: Vital signs in last 24 hours: Temp:  [98 F (36.7 C)-98.4 F (36.9 C)] 98.2 F (36.8 C) (10/11 0500) Pulse Rate:  [107-128] 107 (10/11 0500) Resp:  [20-31] 27 (10/11 0500) BP: (110-133)/(63-80) 110/63 (10/11 0500) SpO2:  [91 %-99 %] 91 % (10/11 0500)  Intake/Output from previous day: 10/10 0701 - 10/11 0700 In: 1080 [P.O.:1080] Out: 1970 [Urine:1870; Stool:100] Intake/Output this shift: Total I/O In: 480 [P.O.:480] Out: 600 [Urine:600]  Alert and oriented PERRLA Speech clear, RA Dexterity much improved in hands. Able to raise BUE and left leg off the bed. LLE > RLE Lumbar incision is covered with Honeycomb dressing and Steri Strips, clean, dry, and intact Posterior cervical incision is covered with Mepilex dressing; Incision is clean, dry, and intact Hard cervical collar in place Foley catheter in place for urinary retention Flexi-seal  Lab Results: No results for input(s): WBC, HGB, HCT, PLT in the last 72 hours. BMET No results for input(s): NA, K, CL, CO2, GLUCOSE, BUN, CREATININE, CALCIUM in the last 72 hours.  Studies/Results: No results found.  Assessment/Plan: Patient sustained a chance type fracture at C2-3 with marked angulation and severe spinal canal stenosis and a severe fracture dislocation with distraction at L1-L2 following a fall from about 10'. Dr. Jordan Likes performed a C2-C3-C4 decompressive laminectomy, followed by a C2-3-4 posterior lateral fusion utilizing lateral mass instrumentation on 04/11/2021. The patient is moving his hands and feet and confirms he has sensation in all extremities. CTA neck on 04/12/2021 negative. Extubated 04/14/2021.   LOS: 9 days   -Case Manager is looking into [facility] inpatient rehab due to insurance -CTO  brace at the bedside. Patient donning the [place] J cervical collar while in bed. Continue to mobilize as tolerated. -Assess surgical wounds daily and change dressings as needed to keep surgical sites dry.   Val Eagle, DNP, AGNP-C Nurse Practitioner  [facility] [address], [place], [place] [address] P: [phone]    F: [phone]  04/20/2021, 10:01 AM

## 2021-04-20 NOTE — Progress Notes (Signed)
Speech Language Pathology Treatment: Dysphagia  Patient Details Name: Julian Allen MRN: [id number] DOB: 10-Apr-1981 Today's Date: 04/20/2021 Time: 0950-1010 SLP Time Calculation (min) (ACUTE ONLY): 20 min  Assessment / Plan / Recommendation Clinical Impression  Pt was seen for therapy targeting swallowing goals. Pt reports intermittent coughing at baseline that continues through mealtimes but no overt difficulty with mealtimes. SLP observed pt with thin liquid and regular solids. Pt exhibited immediate cough with thin liquid after large sips and delayed throat clear with graham cracker that pt reported was d/t presence of phlegm. Recommend regular/thin liquid diet using small sips/bites strategies and remaining silent for 3 seconds after each swallow. He continues to demonstrate instances of inconsistent reduced airway protection and overall his risk for infection is increased given decreased mobility. Lungs were CTA yesterday with MD; RR stable during session without increased work of breathing. SLP will continue to follow for management of diet toleration, consistency of swallow strategy implementation and need for instrumentation of swallow.    HPI HPI: 40 yo male fall through ceiling landing on exercise equipment. intubated 10/2-10/5 C2-3 fx with canal stenosis with cord compression and prevertebral hematoma L1-2 fx dislocation 10/2 PLIF C2-4 L1-2 Decompression laminectomy with reduction of fx T12 L1 L2 L3 PLA with pedicle screw fixation and local autografting, PMH seizure, crohns disease, pt reported ACDF surgery "after another fall years ago".      SLP Plan  Continue with current plan of care      Recommendations for follow up therapy are one component of a multi-disciplinary discharge planning process, led by the attending physician.  Recommendations may be updated based on patient status, additional functional criteria and insurance authorization.    Recommendations  Diet  recommendations: Regular;Thin liquid Liquids provided via: Straw Medication Administration: Crushed with puree Supervision: Staff to assist with self feeding Compensations: Slow rate;Small sips/bites;Other (Comment) (no vocalization immediately after swallow) Postural Changes and/or Swallow Maneuvers: Seated upright 90 degrees                General recommendations: Rehab consult Oral Care Recommendations: Oral care BID Follow up Recommendations: Inpatient Rehab SLP Visit Diagnosis: Dysphagia, unspecified (R13.10) Plan: Continue with current plan of care       GO             Jeannie Done, SLP-Student    Jeannie Done  04/20/2021, 12:22 PM

## 2021-04-21 ENCOUNTER — Inpatient Hospital Stay (HOSPITAL_COMMUNITY): Payer: BLUE CROSS/BLUE SHIELD

## 2021-04-21 DIAGNOSIS — G8250 Quadriplegia, unspecified: Secondary | ICD-10-CM

## 2021-04-21 LAB — BASIC METABOLIC PANEL
Anion gap: 10 (ref 5–15)
BUN: 17 mg/dL (ref 6–20)
CO2: 25 mmol/L (ref 22–32)
Calcium: 8.8 mg/dL — ABNORMAL LOW (ref 8.9–10.3)
Chloride: 99 mmol/L (ref 98–111)
Creatinine, Ser: 0.76 mg/dL (ref 0.61–1.24)
GFR, Estimated: >60 mL/min (ref >60)
Glucose, Bld: 125 mg/dL — ABNORMAL HIGH (ref 70–99)
Potassium: 4.4 mmol/L (ref 3.5–5.1)
Sodium: 134 mmol/L — ABNORMAL LOW (ref 135–145)

## 2021-04-21 LAB — CBC
HCT: 31.5 % — ABNORMAL LOW (ref 39.0–52.0)
Hemoglobin: 10 g/dL — ABNORMAL LOW (ref 13.0–17.0)
MCH: 31 pg (ref 26.0–34.0)
MCHC: 31.7 g/dL (ref 30.0–36.0)
MCV: 97.5 fL (ref 80.0–100.0)
Platelets: 365 10*3/uL (ref 150–400)
RBC: 3.23 MIL/uL — ABNORMAL LOW (ref 4.22–5.81)
RDW: 15 % (ref 11.5–15.5)
WBC: 16.8 10*3/uL — ABNORMAL HIGH (ref 4.0–10.5)
nRBC: 0 % (ref 0.0–0.2)

## 2021-04-21 MED ORDER — NUTRISOURCE FIBER PO PACK
1.0000 | PACK | Freq: Three times a day (TID) | ORAL | Status: DC
  Administered 2021-04-21 – 2021-04-27 (×13): 1 via ORAL
  Filled 2021-04-21 (×21): qty 1

## 2021-04-21 MED ORDER — SODIUM CHLORIDE 0.9 % IV SOLN
2.0000 g | Freq: Every day | INTRAVENOUS | Status: DC
  Administered 2021-04-21 – 2021-04-26 (×6): 2 g via INTRAVENOUS
  Filled 2021-04-21 (×6): qty 20

## 2021-04-21 NOTE — Progress Notes (Signed)
Lower extremity venous has been completed.   Preliminary results in CV Proc.   Julian Allen Julian Allen 04/21/2021 1:24 PM

## 2021-04-21 NOTE — Progress Notes (Signed)
   Providing Compassionate, Quality Care - Together   Subjective: Patient reports no issues overnight. Gross hematuria. Foley in place.  Objective: Vital signs in last 24 hours: Temp:  [97.4 F (36.3 C)-98.2 F (36.8 C)] 97.8 F (36.6 C) (10/12 0727) Pulse Rate:  [100-112] 102 (10/12 0727) Resp:  [16-28] 20 (10/12 0727) BP: (108-129)/(74-81) 123/74 (10/12 0727) SpO2:  [90 %-100 %] 90 % (10/12 0727)  Intake/Output from previous day: 10/11 0701 - 10/12 0700 In: 840 [P.O.:840] Out: 2450 [Urine:2450] Intake/Output this shift: Total I/O In: 200 [P.O.:200] Out: -   Alert and oriented PERRLA Speech clear, RA Dexterity much improved in hands. Able to raise BUE and left leg off the bed. LLE > RLE Lumbar incision is covered with ABD dressing and Steri Strips, old drainage Posterior cervical incision is covered with Mepilex dressing; Dressing with old  Hard cervical collar in place Foley catheter in place for urinary retention and UTI Flexi-seal  Lab Results: Recent Labs    04/20/21 1154 04/21/21 0418  WBC 11.5* 16.8*  HGB 10.0* 10.0*  HCT 31.3* 31.5*  PLT 359 365   BMET Recent Labs    04/20/21 1154 04/21/21 0418  NA 135 134*  K 4.2 4.4  CL 100 99  CO2 28 25  GLUCOSE 122* 125*  BUN 16 17  CREATININE 0.64 0.76  CALCIUM 8.8* 8.8*    Studies/Results: No results found.  Assessment/Plan: Patient sustained a chance type fracture at C2-3 with marked angulation and severe spinal canal stenosis and a severe fracture dislocation with distraction at L1-L2 following a fall from about 10'. Dr. Jordan Likes performed a C2-C3-C4 decompressive laminectomy, followed by a C2-3-4 posterior lateral fusion utilizing lateral mass instrumentation on 04/11/2021. The patient is moving his hands and feet and confirms he has sensation in all extremities. CTA neck on 04/12/2021 negative. Extubated 04/14/2021. Assisted CNA with removal of Flexi-Seal today. Changed surgical wound dressings and c-collar  pads.    LOS: 10 days    -Case Manager is looking into [facility] inpatient rehab due to insurance -CTO brace at the bedside. Patient donning the [place] J cervical collar while in bed. Continue to mobilize as tolerated. -Foley to remain in place for now; patient receiving abx for UTI -Assess surgical wounds daily and change dressings as needed to keep surgical sites dry.      Val Eagle, DNP, AGNP-C Nurse Practitioner  [facility] [address], [address], [place], [place] [address] P: [phone]    F: [phone]  04/21/2021, 10:08 AM

## 2021-04-21 NOTE — Progress Notes (Signed)
Nutrition Follow-up  DOCUMENTATION CODES:   Not applicable  INTERVENTION:   - Please obtain updated weight  - Ensure Enlive po TID, each supplement provides 350 kcal and 20 grams of protein  - Magic Cup TID with meals, each supplement provides 290 kcal and 9 grams of protein  - Hormel Shake TID with meals, each supplement provides 520 kcals and 22 grams protein  - MVI with minerals daily  NUTRITION DIAGNOSIS:   Inadequate oral intake related to inability to eat as evidenced by NPO status.  Progressing, pt now on regular diet with thin liquids  GOAL:   Patient will meet greater than or equal to 90% of their needs  Progressing  MONITOR:   PO intake, Supplement acceptance, Diet advancement, Labs, Weight trends, Skin, I & O's  REASON FOR ASSESSMENT:   Ventilator    ASSESSMENT:   Julian Allen is an 40 y.o. male whom arrived as level 2 activation s/p fall through ceiling at home and landing on exercise equipment at approximately. Fell approx 10 ft.  He was amnestic to the events. Arrived complaining of back pain. Underwent workup in ER and then was upgraded to level 1 when he was not breathing or moving. Did not loose pulses. Reportedly flaccid paralysis, bag mask initiated and intubated.  10/02 - s/p C2-C3-C4 decompressive laminectomy, C2-3-4 posterior lateral fusion, L1-L2 decompressive laminectomy, T12 L1-L2-L3 posterior lateral arthrodesis 10/05 - extubated 10/06 - diet advanced to dysphagia 3 diet with thin liquids 10/11 - regular diet  Noted plan for pt to d/c to inpatient rehab at [facility]. Per notes, rectal tube removed today.  Spoke with pt at bedside. Pt requesting assistance with repositioning in bed. Pt also requesting water and assistance with order lunch meal. RD provided assistance with these items and provided pt with some water in a cup with straw. Pt is full supervision so did not allow pt to have water after RD left room.  Pt reports appetite is good and  that he is eating well. He enjoys the oral nutrition supplements. RD will continue with current supplement regimen at this time.  No new weights since 10/7. Requested updated weight.  Meal Completion: 40-100% x last 8 documented meals (averaging 87%)  Medications reviewed and include: colace, Ensure Enlive TID, nutrisource fiber TID, imodium 2 mg daily, MVI with minerals, IV abx  Labs reviewed: sodium 134, hemoglobin 10.0  UOP: 2450 ml x 24 hours I/O's: +1.0 L since admit  Diet Order:   Diet Order             Diet regular Room service appropriate? Yes with Assist; Fluid consistency: Thin  Diet effective now                   EDUCATION NEEDS:   Not appropriate for education at this time  Skin:  Skin Assessment: Skin Integrity Issues: Incisions: closed neck and back  Last BM:  04/21/21 large type 6  Height:   Ht Readings from Last 1 Encounters:  04/11/21 5\' 8"  (1.727 m)    Weight:   Wt Readings from Last 1 Encounters:  04/16/21 73.4 kg    Ideal Body Weight:  70 kg  BMI:  Body mass index is 24.6 kg/m.  Estimated Nutritional Needs:   Kcal:  2400-2600  Protein:  125-150 grams  Fluid:  > 2 L    06/16/21, MS, RD, LDN Inpatient Clinical Dietitian Please see AMiON for contact information.

## 2021-04-21 NOTE — TOC Progression Note (Signed)
Transition of Care [facility]) - Progression Note    Patient Details  Name: Julian Allen MRN: [id number] Date of Birth: [birth year]  Transition of [facility]) CM/SW Contact  Astrid Drafts Berna Spare, RN Phone Number: 04/21/2021, 9:20am  Clinical Narrative:    Received call from Theodoro Grist, admissions coordinator for [facility] rehab; he states that patient is being reviewed by rehab physician for potential admission.  He states that patient would need to have bilateral lower extremity Doppler studies prior to admission, as required by admitting physician.  He requests ultimate discharge plan after rehab, and physician contact number.  I spoke with patient, and updated him on [facility] referral; he asks that I speak with his dad regarding dc arrangements.   Addendum: 1:48pm Spoke with patient's father, Darius Lundberg, regarding potential admission to [facility] rehab, and support available after rehab stay.  Father states that patient's mother is unable to care for Eddie, and that patient will be discharging to father's home, with care provided by him and step-mother.  Left message for Theodoro Grist at [facility] rehab with all requested information, including MD contact information and support available at discharge.  Anticipate update in a.m. from [facility].   Expected Discharge Plan: IP Rehab Facility Barriers to Discharge: Continued Medical Work up  Expected Discharge Plan and Services Expected Discharge Plan: IP Rehab Facility   Discharge Planning Services: CM Consult                                           Social Determinants of Health (SDOH) Interventions    Readmission Risk Interventions No flowsheet data found.  Quintella Baton, RN, BSN  Trauma/Neuro ICU Case Manager [phone]

## 2021-04-21 NOTE — Progress Notes (Signed)
Trauma/Critical Care Follow Up Note  Subjective:    Overnight Issues: no acute issues.  Still having gross hematuria.  Trying to feed himself.  Objective:  Vital signs for last 24 hours: Temp:  [97.4 F (36.3 C)-98.2 F (36.8 C)] 97.8 F (36.6 C) (10/12 0727) Pulse Rate:  [100-112] 102 (10/12 0727) Resp:  [16-28] 20 (10/12 0727) BP: (108-129)/(74-81) 123/74 (10/12 0727) SpO2:  [90 %-100 %] 90 % (10/12 0727)   Intake/Output from previous day: 10/11 0701 - 10/12 0700 In: 840 [P.O.:840] Out: 2450 [Urine:2450]  Intake/Output this shift: No intake/output data recorded.    Physical Exam:  Gen: comfortable, no distress Neuro: non-focal exam, stable weakness of extremities HEENT: PERRL Neck: supple CV: RRR Pulm: unlabored breathing, CTAB Abd: soft, NT, rectal tube with feculent output GU: bloody urine, foley Extr: wwp, no edema   Results for orders placed or performed during the hospital encounter of 04/11/21 (from the past 24 hour(s))  CBC     Status: Abnormal   Collection Time: 04/20/21 11:54 AM  Result Value Ref Range   WBC 11.5 (H) 4.0 - 10.5 K/uL   RBC 3.25 (L) 4.22 - 5.81 MIL/uL   Hemoglobin 10.0 (L) 13.0 - 17.0 g/dL   HCT 44.0 (L) 34.7 - 42.5 %   MCV 96.3 80.0 - 100.0 fL   MCH 30.8 26.0 - 34.0 pg   MCHC 31.9 30.0 - 36.0 g/dL   RDW 95.6 38.7 - 56.4 %   Platelets 359 150 - 400 K/uL   nRBC 0.0 0.0 - 0.2 %  Basic metabolic panel     Status: Abnormal   Collection Time: 04/20/21 11:54 AM  Result Value Ref Range   Sodium 135 135 - 145 mmol/L   Potassium 4.2 3.5 - 5.1 mmol/L   Chloride 100 98 - 111 mmol/L   CO2 28 22 - 32 mmol/L   Glucose, Bld 122 (H) 70 - 99 mg/dL   BUN 16 6 - 20 mg/dL   Creatinine, Ser 3.32 0.61 - 1.24 mg/dL   Calcium 8.8 (L) 8.9 - 10.3 mg/dL   GFR, Estimated >95 >18 mL/min   Anion gap 7 5 - 15  Urinalysis, Routine w reflex microscopic Urine, Catheterized     Status: Abnormal   Collection Time: 04/20/21 12:15 PM  Result Value Ref Range    Color, Urine RED (A) YELLOW   APPearance TURBID (A) CLEAR   Specific Gravity, Urine  1.005 - 1.030    TEST NOT REPORTED DUE TO COLOR INTERFERENCE OF URINE PIGMENT   pH  5.0 - 8.0    TEST NOT REPORTED DUE TO COLOR INTERFERENCE OF URINE PIGMENT   Glucose, UA (A) NEGATIVE mg/dL    TEST NOT REPORTED DUE TO COLOR INTERFERENCE OF URINE PIGMENT   Hgb urine dipstick (A) NEGATIVE    TEST NOT REPORTED DUE TO COLOR INTERFERENCE OF URINE PIGMENT   Bilirubin Urine (A) NEGATIVE    TEST NOT REPORTED DUE TO COLOR INTERFERENCE OF URINE PIGMENT   Ketones, ur (A) NEGATIVE mg/dL    TEST NOT REPORTED DUE TO COLOR INTERFERENCE OF URINE PIGMENT   Protein, ur (A) NEGATIVE mg/dL    TEST NOT REPORTED DUE TO COLOR INTERFERENCE OF URINE PIGMENT   Nitrite (A) NEGATIVE    TEST NOT REPORTED DUE TO COLOR INTERFERENCE OF URINE PIGMENT   Leukocytes,Ua (A) NEGATIVE    TEST NOT REPORTED DUE TO COLOR INTERFERENCE OF URINE PIGMENT  Urinalysis, Microscopic (reflex)     Status: Abnormal  Collection Time: 04/20/21 12:15 PM  Result Value Ref Range   RBC / HPF >50 0 - 5 RBC/hpf   WBC, UA 21-50 0 - 5 WBC/hpf   Bacteria, UA MANY (A) NONE SEEN   Squamous Epithelial / LPF NONE SEEN 0 - 5  Urine Culture     Status: Abnormal (Preliminary result)   Collection Time: 04/20/21  1:53 PM   Specimen: Urine, Clean Catch  Result Value Ref Range   Specimen Description URINE, CLEAN CATCH    Special Requests NONE    Culture (A)     >=100,000 COLONIES/mL GRAM NEGATIVE RODS SUSCEPTIBILITIES TO FOLLOW Performed at [facility], [address] [place], [place] [address]    Report Status PENDING   CBC     Status: Abnormal   Collection Time: 04/21/21  4:18 AM  Result Value Ref Range   WBC 16.8 (H) 4.0 - 10.5 K/uL   RBC 3.23 (L) 4.22 - 5.81 MIL/uL   Hemoglobin 10.0 (L) 13.0 - 17.0 g/dL   HCT 85.2 (L) 77.8 - 24.2 %   MCV 97.5 80.0 - 100.0 fL   MCH 31.0 26.0 - 34.0 pg   MCHC 31.7 30.0 - 36.0 g/dL   RDW 35.3 61.4 - 43.1 %    Platelets 365 150 - 400 K/uL   nRBC 0.0 0.0 - 0.2 %  Basic metabolic panel     Status: Abnormal   Collection Time: 04/21/21  4:18 AM  Result Value Ref Range   Sodium 134 (L) 135 - 145 mmol/L   Potassium 4.4 3.5 - 5.1 mmol/L   Chloride 99 98 - 111 mmol/L   CO2 25 22 - 32 mmol/L   Glucose, Bld 125 (H) 70 - 99 mg/dL   BUN 17 6 - 20 mg/dL   Creatinine, Ser 5.40 0.61 - 1.24 mg/dL   Calcium 8.8 (L) 8.9 - 10.3 mg/dL   GFR, Estimated >08 >67 mL/min   Anion gap 10 5 - 15    Assessment & Plan:  Present on Admission:  Fall, initial encounter    LOS: 10 days   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  Start Rocephin 2g daily x 7 days at least.  Monitor hgb, but currently stable. Anxiety - buspar started 10/5, PRN Xanax. Improving. VTE - PAS, cont LMWH  FEN - on D3 thins, add imodium and fiber Dispo - ready for discharge to CIR   Letha Cape, PA-C Trauma & General Surgery Please use [email or web address] to contact on call provider  04/21/2021

## 2021-04-21 NOTE — Progress Notes (Signed)
Physical Therapy Treatment Patient Details Name: Julian Allen MRN: [id number] DOB: 1980/09/13 Today's Date: 04/21/2021   History of Present Illness 40 yo male fall through ceiling landing on exercise equipment. intubated 10/2 C2-3 fx with canal stenosis with cord compression and prevertebral hematoma L1-2 fx dislocation 10/2 PLIF C2-4 L1-2 Decompression laminectomy with reduction of fx T12 L1 L2 L3 PLA with pedicle screw fixation and local autografting, PMH seizure, crohns disease    PT Comments    Pt is more solum today.  He appears to be processing some things and reports some increased anxiety around the next steps (CIR ) in his recovery.  He reports that he is exhausted from double sessions yesterday, but understands that it is necessary to build his tolerance, but he just does not have the mental capacity to do full on planned therapy.  He was agreeable for BM check which he was soiled and I reinforced the importance of regular checking to his skin.  We positioned him in semi side lying with bil heels propped off of the bed and assisted with multiple rolls with pericare and bed change. PT will continue to follow acutely for safe mobility progression.   Recommendations for follow up therapy are one component of a multi-disciplinary discharge planning process, led by the attending physician.  Recommendations may be updated based on patient status, additional functional criteria and insurance authorization.  Follow Up Recommendations  CIR     Equipment Recommendations  Wheelchair (measurements PT);Wheelchair cushion (measurements PT);Hospital bed (18x18 tilt in space)    Recommendations for Other Services       Precautions / Restrictions Precautions Precautions: Fall;Cervical;Back Required Braces or Orthoses: Other Brace Other Brace: CTO, can be donned and doffed in sitting; cervical collar on while in bed (per chat text with neuro NP)     Mobility  Bed Mobility Overal bed  mobility: Needs Assistance Bed Mobility: Rolling Rolling: Max assist Sidelying to sit: Max assist       General bed mobility comments: Max assist to roll bil multiple times for peri care and pad placement.  Pt able to verbalize and demonstrate elbow hooking, knee flexion (with assist) needed to initiate rolling.    Transfers                    Ambulation/Gait                 Stairs             Wheelchair Mobility    Modified Rankin (Stroke Patients Only)       Balance                                            Cognition Arousal/Alertness: Awake/alert Behavior During Therapy: Anxious Overall Cognitive Status: Within Functional Limits for tasks assessed                                 General Comments: Pt reporting very mentally distracted today re: his next steps in treatment, uncertainty, did not sleep well last night and is exhausted.      Exercises      General Comments General comments (skin integrity, edema, etc.): After cleaning up BM, positioned pt in sidelying with heels floated and pillows for comfort.      Pertinent Vitals/Pain  Pain Assessment: Faces Faces Pain Scale: Hurts even more Pain Location: back, shoulders bil Pain Descriptors / Indicators: Sore;Aching;Discomfort Pain Intervention(s): Limited activity within patient's tolerance;Monitored during session;Repositioned    Home Living                      Prior Function            PT Goals (current goals can now be found in the care plan section) Progress towards PT goals: Progressing toward goals    Frequency    Min 4X/week      PT Plan Current plan remains appropriate    Co-evaluation              AM-PAC PT "6 Clicks" Mobility   Outcome Measure  Help needed turning from your back to your side while in a flat bed without using bedrails?: Total Help needed moving from lying on your back to sitting on the side of  a flat bed without using bedrails?: Total Help needed moving to and from a bed to a chair (including a wheelchair)?: Total Help needed standing up from a chair using your arms (e.g., wheelchair or bedside chair)?: Total Help needed to walk in hospital room?: Total Help needed climbing 3-5 steps with a railing? : Total 6 Click Score: 6    End of Session Equipment Utilized During Treatment: Cervical collar Activity Tolerance: Patient limited by pain;Patient limited by fatigue Patient left: in bed;with call bell/phone within reach   PT Visit Diagnosis: Other abnormalities of gait and mobility (R26.89);History of falling (Z91.81);Muscle weakness (generalized) (M62.81);Pain Pain - Right/Left:  (bil shoulders, back) Pain - part of body: Shoulder (bil shoulders back)     Time: 2010-0712 PT Time Calculation (min) (ACUTE ONLY): 45 min  Charges:  $Therapeutic Activity: 38-52 mins                    Corinna Capra, PT, DPT  Acute Rehabilitation Ortho Tech Supervisor [phone] pager [phone]) [phone] office

## 2021-04-22 LAB — URINE CULTURE: Culture: >=100000 — AB

## 2021-04-22 LAB — CBC
HCT: 28.9 % — ABNORMAL LOW (ref 39.0–52.0)
Hemoglobin: 9.2 g/dL — ABNORMAL LOW (ref 13.0–17.0)
MCH: 31.2 pg (ref 26.0–34.0)
MCHC: 31.8 g/dL (ref 30.0–36.0)
MCV: 98 fL (ref 80.0–100.0)
Platelets: 385 10*3/uL (ref 150–400)
RBC: 2.95 MIL/uL — ABNORMAL LOW (ref 4.22–5.81)
RDW: 15 % (ref 11.5–15.5)
WBC: 16.9 10*3/uL — ABNORMAL HIGH (ref 4.0–10.5)
nRBC: 0 % (ref 0.0–0.2)

## 2021-04-22 MED ORDER — SODIUM CHLORIDE 0.9% FLUSH: 10.0000 mL | INTRAVENOUS | Status: DC | PRN

## 2021-04-22 NOTE — Progress Notes (Signed)
Occupational Therapy Treatment Note  Pt seen with PT to mobilize to EOB. Pt working on hooking technique to of elbow to roll. Able to help advance BLE off bed. Total A to push up into sitting. VSS in sitting.Pt feels strength of BU/LE is improving. Will need PRAFO R foot when in bed duet o R footdrop.  Pt with pressure area on sacral area. Given immobility and frequent incontinence, and will benefit from being placed on air mattress to reduce risk of pressure areas and to wick away moisture. Pt will benefit from extensive therapy.  Pt voicing his feelings regarding the severity of his injury and feelings of loss. Recommend SW for counseling. Will continue to follow acutely.    04/22/21 1449 04/22/21 1656  OT Visit Information  Last OT Received On  --  04/22/21  Assistance Needed  --  +2  PT/OT/SLP Co-Evaluation/Treatment  --  Yes  Reason for Co-Treatment  --  Complexity of the patient's impairments (multi-system involvement);For patient/therapist safety;To address functional/ADL transfers  OT goals addressed during session  --  ADL's and self-care;Strengthening/ROM  History of Present Illness  --  40 yo male fall through ceiling landing on exercise equipment. intubated 10/2 C2-3 fx with canal stenosis with cord compression and prevertebral hematoma L1-2 fx dislocation 10/2 PLIF C2-4 L1-2 Decompression laminectomy with reduction of fx T12 L1 L2 L3 PLA with pedicle screw fixation and local autografting, PMH seizure, crohns disease  Precautions  Precautions  --  Fall;Cervical;Back  Precaution Booklet Issued  --  No  Precaution Comments  --  watch BP, foley  Required Braces or Orthoses  --  Other Brace  Other Brace  --  CTO, can be donned and doffed in sitting; cervical collar on while in bed (per chat text with neuro NP)  Pain Assessment  Pain Assessment  --  Faces  Faces Pain Scale  --  4  Pain Location  --  back, shoulders bil  Pain Descriptors / Indicators  --  Discomfort  Pain  Intervention(s)  --  Limited activity within patient's tolerance;RN gave pain meds during session  Cognition  Arousal/Alertness  --  Awake/alert  Behavior During Therapy  --  Anxious  Overall Cognitive Status  --  Within Functional Limits for tasks assessed  Bed Mobility  Overal bed mobility  --  Needs Assistance  Bed Mobility  --  Rolling;Sidelying to Sit;Sit to Sidelying  Rolling  --  Max assist;+2 for physical assistance;+2 for safety/equipment  Sidelying to sit  --  Max assist;+2 for physical assistance;+2 for safety/equipment  Sit to sidelying  --  Max assist;+2 for physical assistance;+2 for safety/equipment  General bed mobility comments  --  maxA+2 for all aspects of bed mobility. able to assist with knee flexion and reaching across with R UE  Balance  Overall balance assessment  --  Needs assistance  Sitting-balance support  --  Bilateral upper extremity supported;Feet supported  Sitting balance-Leahy Scale  --  Zero  Sitting balance - Comments  --  max-totalA sitting EOB. Encouraged use of bilateral UEs to stabilize and to active core musculature to assist with maintaining balance.  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  Other Exercises  --  using pillow case under B feet for knee ext/flex  OT - End of Session  Equipment Utilized During Treatment  --  Back brace;Cervical collar (CTO used shen sitting EOB)  Activity Tolerance  --  Patient tolerated treatment well  Patient left  --  in bed;with call bell/phone within  reach;with bed alarm set;with family/visitor present  Nurse Communication  --  Mobility status;Precautions  OT Assessment/Plan  OT Plan  --  Discharge plan remains appropriate  OT Visit Diagnosis  --  Other abnormalities of gait and mobility (R26.89);Muscle weakness (generalized) (M62.81);Pain  Pain - part of body  --   (back)  OT Frequency (ACUTE ONLY)  --  Min 3X/week  Recommendations for Other Services  --  Rehab consult  Follow Up Recommendations  --  CIR;Supervision/Assistance - 24 hour  OT Equipment  --  3 in 1 bedside commode;Wheelchair (measurements OT);Wheelchair cushion (measurements OT);Hospital bed  AM-PAC OT "6 Clicks" Daily Activity Outcome Measure (Version 2)  Help from another person eating meals? 3 3  Help from another person taking care of personal grooming? 2 2  Help from another person toileting, which includes using toliet, bedpan, or urinal? 1 1  Help from another person bathing (including washing, rinsing, drying)? 2 2  Help from another person to put on and taking off regular upper body clothing? 2 2  Help from another person to put on and taking off regular lower body clothing? 1 1  6  Click Score 11 11  Progressive Mobility  What is the highest level of mobility based on the progressive mobility assessment?  --  Level 1 (Bedfast) - Unable to balance while sitting on edge of bed  Mobility  --  Sit up in bed/chair position for meals  OT Goal Progression  Progress towards OT goals  --  Progressing toward goals  Acute Rehab OT Goals  Patient Stated Goal  --  to do whatever it takes  OT Goal Formulation  --  With patient  Time For Goal Achievement  --  04/27/21  Potential to Achieve Goals  --  Good  ADL Goals  Pt Will Perform Eating  --  with min assist;with adaptive utensils;with assist to don/doff brace/orthosis;sitting;bed level  Pt Will Perform Grooming  --  with mod assist;sitting;with adaptive equipment  Pt Will Transfer to Toilet  --  with mod assist;with  transfer board;bedside commode  Pt/caregiver will Perform Home Exercise Program  --  Increased ROM;Increased strength;Both right and left upper extremity;Independently;With written HEP provided  Additional ADL Goal #1  --  pt will tolerate EOB for 10 minutes stable vital signs as precursor to adls.  Additional ADL Goal #2  --  Pt will demonstrate shoulder retraction x10 reps min (A)  Additional ADL Goal #3  --  Pt will be able to sit EOB working on sitting balance with no more than min A for balance.  OT Time Calculation  OT Start Time (ACUTE ONLY) 1245 1357  OT Stop Time (ACUTE ONLY) 1257 1445  OT Time Calculation (min) 12 min 48 min  OT General Charges  $OT Visit  --  1 Visit  OT Treatments  $Self Care/Home Management   --  8-22 mins  $Therapeutic Exercise  --  8-22 mins  04/29/21, OT/L   Acute OT Clinical Specialist Acute Rehabilitation  Services Pager [phone] Office [phone]

## 2021-04-22 NOTE — Progress Notes (Signed)
Physical Therapy Treatment Patient Details Name: Julian Allen MRN: [id number] DOB: Jul 23, 1980 Today's Date: 04/22/2021   History of Present Illness 40 yo male fall through ceiling landing on exercise equipment. intubated 10/2 C2-3 fx with canal stenosis with cord compression and prevertebral hematoma L1-2 fx dislocation 10/2 PLIF C2-4 L1-2 Decompression laminectomy with reduction of fx T12 L1 L2 L3 PLA with pedicle screw fixation and local autografting, PMH seizure, crohns disease    PT Comments    Patient reports being sore and increased pain this date. Patient requires maxA+2 for bed mobility and max-totalA to maintain sitting balance at EOB. Instructed patient to utilize core and bilateral UE to stabilize seated EOB. Patient performed seated LE exercises with emphasis on controlled movement. Sat EOB x 15 minutes. Continue to recommend comprehensive inpatient rehab (CIR) for post-acute therapy needs.     Recommendations for follow up therapy are one component of a multi-disciplinary discharge planning process, led by the attending physician.  Recommendations may be updated based on patient status, additional functional criteria and insurance authorization.  Follow Up Recommendations  CIR     Equipment Recommendations  Wheelchair (measurements PT);Wheelchair cushion (measurements PT);Hospital bed (18x18 tilt in space)    Recommendations for Other Services       Precautions / Restrictions Precautions Precautions: Fall;Cervical;Back Precaution Booklet Issued: No Precaution Comments: watch BP, foley Required Braces or Orthoses: Other Brace Other Brace: CTO, can be donned and doffed in sitting; cervical collar on while in bed (per chat text with neuro NP) Restrictions Weight Bearing Restrictions: No     Mobility  Bed Mobility Overal bed mobility: Needs Assistance Bed Mobility: Rolling;Sidelying to Sit;Sit to Sidelying Rolling: Max assist;+2 for physical assistance;+2 for  safety/equipment Sidelying to sit: Max assist;+2 for physical assistance;+2 for safety/equipment     Sit to sidelying: Max assist;+2 for physical assistance;+2 for safety/equipment General bed mobility comments: maxA+2 for all aspects of bed mobility. able to assist with knee flexion and reaching across with R UE    Transfers                    Ambulation/Gait                 Stairs             Wheelchair Mobility    Modified Rankin (Stroke Patients Only)       Balance Overall balance assessment: Needs assistance Sitting-balance support: Bilateral upper extremity supported;Feet supported Sitting balance-Leahy Scale: Zero Sitting balance - Comments: max-totalA sitting EOB. Encouraged use of bilateral UEs to stabilize and to active core musculature to assist with maintaining balance. Postural control: Posterior lean                                  Cognition Arousal/Alertness: Awake/alert Behavior During Therapy: Anxious Overall Cognitive Status: Within Functional Limits for tasks assessed                                        Exercises General Exercises - Lower Extremity Long Arc Quad: AROM;AAROM;Both;10 reps;Seated Toe Raises: Right;Left;5 reps;Seated Heel Raises: Right;Left;5 reps;Seated    General Comments        Pertinent Vitals/Pain Pain Assessment: Faces Faces Pain Scale: Hurts little more Pain Location: back, shoulders bil Pain Descriptors / Indicators: Discomfort Pain Intervention(s): Monitored during session;Repositioned;Limited  activity within patient's tolerance    Home Living                      Prior Function            PT Goals (current goals can now be found in the care plan section) Acute Rehab PT Goals Patient Stated Goal: to do whatever it takes PT Goal Formulation: With patient Time For Goal Achievement: 04/27/21 Potential to Achieve Goals: Good Progress towards PT goals:  Progressing toward goals    Frequency    Min 4X/week      PT Plan Current plan remains appropriate    Co-evaluation PT/OT/SLP Co-Evaluation/Treatment: Yes Reason for Co-Treatment: Complexity of the patient's impairments (multi-system involvement);For patient/therapist safety;To address functional/ADL transfers PT goals addressed during session: Mobility/safety with mobility;Balance;Strengthening/ROM        AM-PAC PT "6 Clicks" Mobility   Outcome Measure  Help needed turning from your back to your side while in a flat bed without using bedrails?: Total Help needed moving from lying on your back to sitting on the side of a flat bed without using bedrails?: Total Help needed moving to and from a bed to a chair (including a wheelchair)?: Total Help needed standing up from a chair using your arms (e.g., wheelchair or bedside chair)?: Total Help needed to walk in hospital room?: Total Help needed climbing 3-5 steps with a railing? : Total 6 Click Score: 6    End of Session Equipment Utilized During Treatment: Cervical collar Activity Tolerance: Patient limited by pain;Patient limited by fatigue Patient left: in bed;with call bell/phone within reach Nurse Communication: Mobility status;Need for lift equipment PT Visit Diagnosis: Other abnormalities of gait and mobility (R26.89);History of falling (Z91.81);Muscle weakness (generalized) (M62.81);Pain Pain - Right/Left: Right Pain - part of body: Shoulder     Time: 7903-8333 PT Time Calculation (min) (ACUTE ONLY): 38 min  Charges:  $Therapeutic Activity: 23-37 mins                     Laelia Angelo A. Dan Humphreys PT, DPT Acute Rehabilitation Services Pager [phone] Office [phone]    [name] 04/22/2021, 4:44 PM

## 2021-04-22 NOTE — Progress Notes (Signed)
   Trauma/Critical Care Follow Up Note  Subjective:    Overnight Issues: no acute issues.  Hematuria resolved.  Tired today from working with therapies yesterday  Objective:  Vital signs for last 24 hours: Temp:  [98.2 F (36.8 C)-98.5 F (36.9 C)] 98.2 F (36.8 C) (10/13 0755) Pulse Rate:  [98-121] 98 (10/13 0755) Resp:  [17-20] 17 (10/13 0755) BP: (107-132)/(63-81) 107/81 (10/13 0755) SpO2:  [96 %-98 %] 98 % (10/13 0755)   Intake/Output from previous day: 10/12 0701 - 10/13 0700 In: 900 [P.O.:800; IV Piggyback:100] Out: 1450 [Urine:1450]  Intake/Output this shift: No intake/output data recorded.    Physical Exam:  Gen: comfortable, no distress Neuro: non-focal exam, stable weakness of extremities HEENT: PERRL Neck: supple CV: RRR Pulm: unlabored breathing, CTAB Abd: soft, NT, pudding like stool in diaper GU: foley with clear yellow urine Extr: wwp, no edema   Results for orders placed or performed during the hospital encounter of 04/11/21 (from the past 24 hour(s))  CBC     Status: Abnormal   Collection Time: 04/22/21  1:53 AM  Result Value Ref Range   WBC 16.9 (H) 4.0 - 10.5 K/uL   RBC 2.95 (L) 4.22 - 5.81 MIL/uL   Hemoglobin 9.2 (L) 13.0 - 17.0 g/dL   HCT 29.5 (L) 28.4 - 13.2 %   MCV 98.0 80.0 - 100.0 fL   MCH 31.2 26.0 - 34.0 pg   MCHC 31.8 30.0 - 36.0 g/dL   RDW 44.0 10.2 - 72.5 %   Platelets 385 150 - 400 K/uL   nRBC 0.0 0.0 - 0.2 %    Assessment & Plan:  Present on Admission:  Fall, initial encounter    LOS: 11 days   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  Rocephin 2g daily x 7 days at least.  Hgb stable Anxiety - buspar started 10/5, PRN Xanax. Improving. VTE - PAS, cont LMWH  FEN - on D3 thins, imodium daily and fiber TID ID - Rocephin 2g D2/7 Dispo - ready for discharge to CIR   Letha Cape, PA-C Trauma & General Surgery Please use [email or web address] to contact on call provider  04/22/2021

## 2021-04-22 NOTE — Progress Notes (Addendum)
Providing Compassionate, Quality Care - Together   Subjective: Patient reports low back pain.  Objective: Vital signs in last 24 hours: Temp:  [98.2 F (36.8 C)-98.5 F (36.9 C)] 98.2 F (36.8 C) (10/13 1103) Pulse Rate:  [98-121] 109 (10/13 1103) Resp:  [17-20] 19 (10/13 1103) BP: (107-132)/(63-81) 121/72 (10/13 1103) SpO2:  [96 %-98 %] 98 % (10/13 1103)  Intake/Output from previous day: 10/12 0701 - 10/13 0700 In: 900 [P.O.:800; IV Piggyback:100] Out: 1450 [Urine:1450] Intake/Output this shift: No intake/output data recorded.  Alert and oriented PERRLA Speech clear, RA Able to raise BUE and left leg off the bed. LLE > RLE Lumbar incision is covered with ABD dressing and Steri Strips, small amount of serosanguinous drainage. Incision with some erythema and bruising Posterior cervical incision is covered with Mepilex dressing; Dressing with small amount of serosanguinous drainage. Incision with mild erythema Hard cervical collar in place Foley catheter in place for urinary retention and UTI, urine has cleared up from yesterday    Lab Results: Recent Labs    04/21/21 0418 04/22/21 0153  WBC 16.8* 16.9*  HGB 10.0* 9.2*  HCT 31.5* 28.9*  PLT 365 385   BMET Recent Labs    04/20/21 1154 04/21/21 0418  NA 135 134*  K 4.2 4.4  CL 100 99  CO2 28 25  GLUCOSE 122* 125*  BUN 16 17  CREATININE 0.64 0.76  CALCIUM 8.8* 8.8*    Studies/Results: VAS [place] LOWER EXTREMITY VENOUS (DVT)  Result Date: 04/21/2021  Lower Venous DVT Study Patient Name:  Julian Allen  Date of Exam:   04/21/2021 Medical Rec #: [id number]      Accession #:    [id number] Date of Birth: 01/07/1981      Patient Gender: M Patient Age:   40 years Exam Location:  [facility] Procedure:      VAS [place] LOWER EXTREMITY VENOUS (DVT) Referring Phys: Tresa Endo OSBORNE --------------------------------------------------------------------------------  Indications: Quadriplegia.  Comparison Study: no prior  Performing Technologist: [place] Ponder RVS  Examination Guidelines: A complete evaluation includes B-mode imaging, spectral Doppler, color Doppler, and power Doppler as needed of all accessible portions of each vessel. Bilateral testing is considered an integral part of a complete examination. Limited examinations for reoccurring indications may be performed as noted. The reflux portion of the exam is performed with the patient in reverse Trendelenburg.  +---------+---------------+---------+-----------+----------+--------------+ RIGHT    CompressibilityPhasicitySpontaneityPropertiesThrombus Aging +---------+---------------+---------+-----------+----------+--------------+ CFV      Full           Yes      Yes                                 +---------+---------------+---------+-----------+----------+--------------+ SFJ      Full                                                        +---------+---------------+---------+-----------+----------+--------------+ FV Prox  Full                                                        +---------+---------------+---------+-----------+----------+--------------+ FV Mid   Full                                                        +---------+---------------+---------+-----------+----------+--------------+  FV DistalFull                                                        +---------+---------------+---------+-----------+----------+--------------+ PFV      Full                                                        +---------+---------------+---------+-----------+----------+--------------+ POP      Full           Yes      Yes                                 +---------+---------------+---------+-----------+----------+--------------+ PTV      Full                                                        +---------+---------------+---------+-----------+----------+--------------+ PERO     Full                                                         +---------+---------------+---------+-----------+----------+--------------+   +---------+---------------+---------+-----------+----------+--------------+ LEFT     CompressibilityPhasicitySpontaneityPropertiesThrombus Aging +---------+---------------+---------+-----------+----------+--------------+ CFV      Full           Yes      Yes                                 +---------+---------------+---------+-----------+----------+--------------+ SFJ      Full                                                        +---------+---------------+---------+-----------+----------+--------------+ FV Prox  Full                                                        +---------+---------------+---------+-----------+----------+--------------+ FV Mid   Full                                                        +---------+---------------+---------+-----------+----------+--------------+ FV DistalFull                                                        +---------+---------------+---------+-----------+----------+--------------+  PFV      Full                                                        +---------+---------------+---------+-----------+----------+--------------+ POP      Full           Yes      Yes                                 +---------+---------------+---------+-----------+----------+--------------+ PTV      Full                                                        +---------+---------------+---------+-----------+----------+--------------+ PERO     Full                                                        +---------+---------------+---------+-----------+----------+--------------+     Summary: BILATERAL: - No evidence of deep vein thrombosis seen in the lower extremities, bilaterally. -No evidence of popliteal cyst, bilaterally.   *See table(s) above for measurements and observations. Electronically signed by Coral Else MD on 04/21/2021 at 9:24:06 PM.    Final     Assessment/Plan: Patient sustained a chance type fracture at C2-3 with marked angulation and severe spinal canal stenosis and a severe fracture dislocation with distraction at L1-L2 following a fall from about 10'. Dr. Jordan Likes performed a C2-C3-C4 decompressive laminectomy, followed by a C2-3-4 posterior lateral fusion utilizing lateral mass instrumentation on 04/11/2021. The patient is moving his hands and feet and confirms he has sensation in all extremities. CTA neck on 04/12/2021 negative. Extubated 04/14/2021. Changed cervical and thoracolumbar dressings. Surgical wounds look like they are mildly irritated from bracing and patient lying in bed.    LOS: 11 days   -Plan is for [facility] inpatient rehab hopefully Tuesday -CTO brace at the bedside. Patient donning the [place] J cervical collar while in bed. Continue to mobilize as tolerated. -Foley to remain in place for now; patient receiving abx for UTI -Assess surgical wounds daily and change dressings as needed to keep surgical sites dry.     Val Eagle, DNP, AGNP-C Nurse Practitioner  [facility] [address], [address], [place], [place] [address] P: [phone]    F: [phone]  04/22/2021, 11:04 AM

## 2021-04-22 NOTE — Progress Notes (Signed)
Sacrum assessed with Aubery Lapping, RN and no pressure injury found. OT discussed with this RN that she had noticed non-blanchable redness to sacrum during her session therefore skin assessed with [place], RN and this RN and blanchable redness noted to sacrum and bruising to scrotum only.

## 2021-04-22 NOTE — Progress Notes (Signed)
Orthopedic Tech Progress Note Patient Details:  Julian Allen 20-Aug-1980 [id number]  Ortho Devices Type of Ortho Device: Prafo boot/shoe Ortho Device/Splint Location: RLE Ortho Device/Splint Interventions: Ordered, Application, Adjustment   Post Interventions Patient Tolerated: Well Instructions Provided: Adjustment of device, Care of device, Poper ambulation with device  Julian Allen 04/22/2021, 8:34 PM

## 2021-04-22 NOTE — Progress Notes (Signed)
Speech Language Pathology Treatment: Dysphagia  Patient Details Name: Julian Allen MRN: [id number] DOB: 08/04/1980 Today's Date: 04/22/2021 Time: 1414-1425 SLP Time Calculation (min) (ACUTE ONLY): 11 min  Assessment / Plan / Recommendation Clinical Impression  Pt was seen for therapy targeting swallowing goals. Pt and RN report no difficulty at mealtimes. SLP observed pt with crushed meds in applesauce and thin liquid, noting no oral phase or overt s/s of penetration/aspiration. Recommend regular/thin liquid diet, administering medications crushed in puree d/t limited oral mobility secondary to head/neck brace. Pt had no remaining questions re: swallowing safety/concerns and has met all goals. SLP no longer needed for acute swallowing tx.   HPI HPI: 40 yo male fall through ceiling landing on exercise equipment. intubated 10/2-10/5 C2-3 fx with canal stenosis with cord compression and prevertebral hematoma L1-2 fx dislocation 10/2 PLIF C2-4 L1-2 Decompression laminectomy with reduction of fx T12 L1 L2 L3 PLA with pedicle screw fixation and local autografting, PMH seizure, crohns disease, pt reported ACDF surgery "after another fall years ago".      SLP Plan  All goals met      Recommendations for follow up therapy are one component of a multi-disciplinary discharge planning process, led by the attending physician.  Recommendations may be updated based on patient status, additional functional criteria and insurance authorization.    Recommendations  Diet recommendations: Regular;Thin liquid Liquids provided via: Straw Medication Administration: Crushed with puree Supervision: Staff to assist with self feeding Compensations: Slow rate;Small sips/bites;Other (Comment) (no vocalization immediately after swallow) Postural Changes and/or Swallow Maneuvers: Seated upright 90 degrees                General recommendations: Rehab consult Oral Care Recommendations: Oral care BID Follow up  Recommendations: Inpatient Rehab SLP Visit Diagnosis: Dysphagia, unspecified (R13.10) Plan: All goals met       GO               , SLP-Student      04/22/2021, 3:23 PM 

## 2021-04-22 NOTE — Progress Notes (Signed)
Occupational Therapy Treatment Note Focus of session on assessing UE movement patterns, UE strengthening and educating on use of plate guard to help with self feeding. Will return with PT to mobilize to EOB.    04/22/21 1449  OT Visit Information  Last OT Received On 04/22/21  Assistance Needed +2  History of Present Illness 40 yo male fall through ceiling landing on exercise equipment. intubated 10/2 C2-3 fx with canal stenosis with cord compression and prevertebral hematoma L1-2 fx dislocation 10/2 PLIF C2-4 L1-2 Decompression laminectomy with reduction of fx T12 L1 L2 L3 PLA with pedicle screw fixation and local autografting, PMH seizure, crohns disease  Precautions  Precautions Fall;Cervical;Back  Precaution Booklet Issued No  Precaution Comments watch BP, flexiseal , foley  Required Braces or Orthoses Other Brace  Other Brace CTO, can be donned and doffed in sitting; cervical collar on while in bed (per chat text with neuro NP)  Pain Assessment  Pain Assessment Faces  Faces Pain Scale 2  Pain Descriptors / Indicators Discomfort  Pain Intervention(s) Limited activity within patient's tolerance;Monitored during session  Cognition  Arousal/Alertness Awake/alert  Behavior During Therapy WFL for tasks assessed/performed  Overall Cognitive Status Within Functional Limits for tasks assessed  Upper Extremity Assessment  LUE Deficits / Details LUE overall stronger than R; able to complete hand to mouth adn hand to forehead; poor scapulohumeral rythm with active shoulder elevation however poor depression; atophy @ B shoulders; apparent neuropraxia limitign funcitonal use; RUE similar to L however decreased shoulder ROM; pt staes decreased shoulder ROM at baseline  ADL  General ADL Comments Pt seen to assess ability to self feed; plate guard issued to hlep with bumping food and to decrease spillage. Mom present during session and educated on use. Pt using red tubing which he states helps  significantly  Balance  Sitting balance-Leahy Scale Zero  Exercises  Exercises General Upper Extremity  General Exercises - Upper Extremity  Shoulder ABduction AAROM;Both;10 reps;Supine  Shoulder Flexion AAROM;Both;10 reps;Supine  Elbow Flexion AROM;Both;15 reps;Supine  Wrist Flexion AROM;Both;10 reps;Seated  Wrist Extension AROM;Both;10 reps;Seated  Other Exercises  Other Exercises digit opposition  Other Exercises scapular depression and retraction  OT - End of Session  Activity Tolerance Patient tolerated treatment well  Patient left in bed;with call bell/phone within reach;with bed alarm set;with family/visitor present  Nurse Communication Mobility status;Precautions  OT Assessment/Plan  OT Plan Discharge plan remains appropriate  OT Visit Diagnosis Other abnormalities of gait and mobility (R26.89);Muscle weakness (generalized) (M62.81);Pain  Pain - part of body  (back)  OT Frequency (ACUTE ONLY) Min 3X/week  Recommendations for Other Services Rehab consult  Follow Up Recommendations CIR;Supervision/Assistance - 24 hour  OT Equipment 3 in 1 bedside commode;Wheelchair (measurements OT);Wheelchair cushion (measurements OT);Hospital bed  AM-PAC OT "6 Clicks" Daily Activity Outcome Measure (Version 2)  Help from another person eating meals? 3  Help from another person taking care of personal grooming? 2  Help from another person toileting, which includes using toliet, bedpan, or urinal? 1  Help from another person bathing (including washing, rinsing, drying)? 2  Help from another person to put on and taking off regular upper body clothing? 2  Help from another person to put on and taking off regular lower body clothing? 1  6 Click Score 11  Progressive Mobility  What is the highest level of mobility based on the progressive mobility assessment? Level 1 (Bedfast) - Unable to balance while sitting on edge of bed  Mobility Sit up in bed/chair position  for meals  OT Goal Progression   Progress towards OT goals Progressing toward goals  Acute Rehab OT Goals  Patient Stated Goal to do whatever it takes  OT Goal Formulation With patient  Time For Goal Achievement 04/27/21  Potential to Achieve Goals Good  ADL Goals  Pt Will Perform Eating with min assist;with adaptive utensils;with assist to don/doff brace/orthosis;sitting;bed level  Pt Will Perform Grooming with mod assist;sitting;with adaptive equipment  Pt Will Transfer to Toilet with mod assist;with transfer board;bedside commode  Pt/caregiver will Perform Home Exercise Program Increased ROM;Increased strength;Both right and left upper extremity;Independently;With written HEP provided  Additional ADL Goal #1 pt will tolerate EOB for 10 minutes stable vital signs as precursor to adls.  Additional ADL Goal #2 Pt will demonstrate shoulder retraction x10 reps min (A)  Additional ADL Goal #3 Pt will be able to sit EOB working on sitting balance with no more than min A for balance.  OT Time Calculation  OT Start Time (ACUTE ONLY) 1245  OT Stop Time (ACUTE ONLY) 1257  OT Time Calculation (min) 12 min  OT General Charges  $OT Visit 1 Visit  OT Treatments  $Self Care/Home Management  8-22 mins  Luisa Dago, OT/L   Acute OT Clinical Specialist Acute Rehabilitation Services Pager [phone] Office [phone]

## 2021-04-22 NOTE — TOC Progression Note (Signed)
Transition of Care [facility]) - Progression Note    Patient Details  Name: Julian Allen MRN: [id number] Date of Birth: 05/17/1981  Transition of Care [facility]) CM/SW Contact  Astrid Drafts Berna Spare, RN Phone Number: 04/22/2021, 10:54am  Clinical Narrative:    Per Theodoro Grist, admissions coordinator at [facility] rehab, bed potentially available on Tuesday, October 18.  Insurance authorization has been initiated by facility.  Faxed updated therapy notes to admissions coordinator, per his request.  Theodoro Grist to follow-up with patient's father to discuss support needed at discharge.   Expected Discharge Plan: IP Rehab Facility Barriers to Discharge: Continued Medical Work up  Expected Discharge Plan and Services Expected Discharge Plan: IP Rehab Facility   Discharge Planning Services: CM Consult                                           Social Determinants of Health (SDOH) Interventions    Readmission Risk Interventions No flowsheet data found.  Quintella Baton, RN, BSN  Trauma/Neuro ICU Case Manager [phone]

## 2021-04-23 LAB — CBC
HCT: 28.2 % — ABNORMAL LOW (ref 39.0–52.0)
Hemoglobin: 8.9 g/dL — ABNORMAL LOW (ref 13.0–17.0)
MCH: 30.6 pg (ref 26.0–34.0)
MCHC: 31.6 g/dL (ref 30.0–36.0)
MCV: 96.9 fL (ref 80.0–100.0)
Platelets: 433 10*3/uL — ABNORMAL HIGH (ref 150–400)
RBC: 2.91 MIL/uL — ABNORMAL LOW (ref 4.22–5.81)
RDW: 14.8 % (ref 11.5–15.5)
WBC: 11.9 10*3/uL — ABNORMAL HIGH (ref 4.0–10.5)
nRBC: 0 % (ref 0.0–0.2)

## 2021-04-23 MED ORDER — ADALIMUMAB 40 MG/0.4ML ~~LOC~~ PSKT
40.0000 mg | PREFILLED_SYRINGE | SUBCUTANEOUS | Status: DC
  Administered 2021-04-23: 40 mg via SUBCUTANEOUS
  Filled 2021-04-23: qty 2

## 2021-04-23 NOTE — Progress Notes (Signed)
Providing Compassionate, Quality Care - Together   Subjective: Patient is in good spirits this morning.  Objective: Vital signs in last 24 hours: Temp:  [97.6 F (36.4 C)-98.7 F (37.1 C)] 97.8 F (36.6 C) (10/14 0739) Pulse Rate:  [92-110] 94 (10/14 0739) Resp:  [16-24] 16 (10/14 0739) BP: (105-127)/(65-77) 127/77 (10/14 0739) SpO2:  [97 %-99 %] 97 % (10/14 0739)  Intake/Output from previous day: 10/13 0701 - 10/14 0700 In: 1050 [P.O.:1050] Out: 1850 [Urine:1850] Intake/Output this shift: No intake/output data recorded.  Alert and oriented PERRLA Speech clear, RA Able to raise BUE and left leg off the bed. LLE > RLE Lumbar incision is covered with ABD dressing and Steri Strips, small amount of serosanguinous drainage. Incision with some erythema and bruising Posterior cervical incision is covered with Mepilex dressing; Dressing with small amount of serosanguinous drainage. Incision with mild erythema Hard cervical collar in place Foley catheter in place for urinary retention and UTI  Lab Results: Recent Labs    04/22/21 0153 04/23/21 0330  WBC 16.9* 11.9*  HGB 9.2* 8.9*  HCT 28.9* 28.2*  PLT 385 433*   BMET Recent Labs    04/20/21 1154 04/21/21 0418  NA 135 134*  K 4.2 4.4  CL 100 99  CO2 28 25  GLUCOSE 122* 125*  BUN 16 17  CREATININE 0.64 0.76  CALCIUM 8.8* 8.8*    Studies/Results: VAS [place] LOWER EXTREMITY VENOUS (DVT)  Result Date: 04/21/2021  Lower Venous DVT Study Patient Name:  KHALIL SZCZEPANIK  Date of Exam:   04/21/2021 Medical Rec #: [id number]      Accession #:    [id number] Date of Birth: [birth year]      Patient Gender: M Patient Age:   40 years Exam Location:  [facility] Procedure:      VAS [place] LOWER EXTREMITY VENOUS (DVT) Referring Phys: Tresa Endo OSBORNE --------------------------------------------------------------------------------  Indications: Quadriplegia.  Comparison Study: no prior Performing Technologist: [place] Ponder RVS   Examination Guidelines: A complete evaluation includes B-mode imaging, spectral Doppler, color Doppler, and power Doppler as needed of all accessible portions of each vessel. Bilateral testing is considered an integral part of a complete examination. Limited examinations for reoccurring indications may be performed as noted. The reflux portion of the exam is performed with the patient in reverse Trendelenburg.  +---------+---------------+---------+-----------+----------+--------------+ RIGHT    CompressibilityPhasicitySpontaneityPropertiesThrombus Aging +---------+---------------+---------+-----------+----------+--------------+ CFV      Full           Yes      Yes                                 +---------+---------------+---------+-----------+----------+--------------+ SFJ      Full                                                        +---------+---------------+---------+-----------+----------+--------------+ FV Prox  Full                                                        +---------+---------------+---------+-----------+----------+--------------+ FV Mid   Full                                                        +---------+---------------+---------+-----------+----------+--------------+  FV DistalFull                                                        +---------+---------------+---------+-----------+----------+--------------+ PFV      Full                                                        +---------+---------------+---------+-----------+----------+--------------+ POP      Full           Yes      Yes                                 +---------+---------------+---------+-----------+----------+--------------+ PTV      Full                                                        +---------+---------------+---------+-----------+----------+--------------+ PERO     Full                                                         +---------+---------------+---------+-----------+----------+--------------+   +---------+---------------+---------+-----------+----------+--------------+ LEFT     CompressibilityPhasicitySpontaneityPropertiesThrombus Aging +---------+---------------+---------+-----------+----------+--------------+ CFV      Full           Yes      Yes                                 +---------+---------------+---------+-----------+----------+--------------+ SFJ      Full                                                        +---------+---------------+---------+-----------+----------+--------------+ FV Prox  Full                                                        +---------+---------------+---------+-----------+----------+--------------+ FV Mid   Full                                                        +---------+---------------+---------+-----------+----------+--------------+ FV DistalFull                                                        +---------+---------------+---------+-----------+----------+--------------+  PFV      Full                                                        +---------+---------------+---------+-----------+----------+--------------+ POP      Full           Yes      Yes                                 +---------+---------------+---------+-----------+----------+--------------+ PTV      Full                                                        +---------+---------------+---------+-----------+----------+--------------+ PERO     Full                                                        +---------+---------------+---------+-----------+----------+--------------+     Summary: BILATERAL: - No evidence of deep vein thrombosis seen in the lower extremities, bilaterally. -No evidence of popliteal cyst, bilaterally.   *See table(s) above for measurements and observations. Electronically signed by Coral Else MD on 04/21/2021 at 9:24:06  PM.    Final     Assessment/Plan: Patient sustained a chance type fracture at C2-3 with marked angulation and severe spinal canal stenosis and a severe fracture dislocation with distraction at L1-L2 following a fall from about 10'. Dr. Jordan Likes performed a C2-C3-C4 decompressive laminectomy, followed by a C2-3-4 posterior lateral fusion utilizing lateral mass instrumentation on 04/11/2021. The patient is moving his hands and feet and confirms he has sensation in all extremities. CTA neck on 04/12/2021 negative. Extubated 04/14/2021. Changed cervical and thoracolumbar dressings. Surgical wounds are looking better today    LOS: 12 days      Val Eagle, DNP, AGNP-C Nurse Practitioner  [facility] [address], [address], [place], [place] [address] P: [phone]    F: [phone]  04/23/2021, 8:49 AM  -Plan is for [facility] inpatient rehab hopefully Tuesday -CTO brace at the bedside. Patient donning the [place] J cervical collar while in bed. Continue to mobilize as tolerated. -Foley to remain in place for now; patient receiving abx for UTI -Assess surgical wounds daily and change dressings as needed to keep surgical sites dry.

## 2021-04-23 NOTE — Progress Notes (Signed)
   Trauma/Critical Care Follow Up Note  Subjective:    Overnight Issues: no acute issues.  Hematuria resolved.  Looks great today.  Objective:  Vital signs for last 24 hours: Temp:  [97.6 F (36.4 C)-98.7 F (37.1 C)] 97.8 F (36.6 C) (10/14 0739) Pulse Rate:  [92-110] 94 (10/14 0739) Resp:  [16-24] 16 (10/14 0739) BP: (105-127)/(65-77) 127/77 (10/14 0739) SpO2:  [97 %-99 %] 97 % (10/14 0739)   Intake/Output from previous day: 10/13 0701 - 10/14 0700 In: 1050 [P.O.:1050] Out: 1850 [Urine:1850]  Intake/Output this shift: No intake/output data recorded.    Physical Exam:  Gen: comfortable, no distress Neuro: non-focal exam, stable weakness of extremities HEENT: PERRL Neck: supple CV: RRR Pulm: unlabored breathing, CTAB Abd: soft, NT GU: foley with clear yellow urine Extr: wwp, no edema   Results for orders placed or performed during the hospital encounter of 04/11/21 (from the past 24 hour(s))  CBC     Status: Abnormal   Collection Time: 04/23/21  3:30 AM  Result Value Ref Range   WBC 11.9 (H) 4.0 - 10.5 K/uL   RBC 2.91 (L) 4.22 - 5.81 MIL/uL   Hemoglobin 8.9 (L) 13.0 - 17.0 g/dL   HCT 82.4 (L) 23.5 - 36.1 %   MCV 96.9 80.0 - 100.0 fL   MCH 30.6 26.0 - 34.0 pg   MCHC 31.6 30.0 - 36.0 g/dL   RDW 44.3 15.4 - 00.8 %   Platelets 433 (H) 150 - 400 K/uL   nRBC 0.0 0.0 - 0.2 %    Assessment & Plan:  Present on Admission:  Fall, initial encounter    LOS: 12 days   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  Sensitive to Rocephin.  Rocephin 2g daily x 7 days at least.  Hgb stable.  WBC down to 11K Anxiety - buspar started 10/5, PRN Xanax. Improving. VTE - PAS, cont LMWH  FEN - on D3 thins, imodium daily and fiber TID ID - Rocephin 2g D3/7 Dispo - ready for discharge to CIR, likely next Tuesday   Letha Cape, PA-C Trauma & General Surgery Please use [email or web address] to contact on call provider  04/23/2021

## 2021-04-23 NOTE — TOC Progression Note (Signed)
Transition of Care [facility]) - Progression Note    Patient Details  Name: Julian Allen MRN: [id number] Date of Birth: [birth year]  Transition of Care [facility]) CM/SW Contact  Astrid Drafts Berna Spare, RN Phone Number: 04/23/2021, 2:47 PM  Clinical Narrative:    Left message with Theodoro Grist in admissions at [facility] requesting update on potential rehab admission for Tuesday, October 18.   Expected Discharge Plan: IP Rehab Facility Barriers to Discharge: Continued Medical Work up  Expected Discharge Plan and Services Expected Discharge Plan: IP Rehab Facility   Discharge Planning Services: CM Consult                                           Social Determinants of Health (SDOH) Interventions    Readmission Risk Interventions No flowsheet data found.  Quintella Baton, RN, BSN  Trauma/Neuro ICU Case Manager [phone]

## 2021-04-23 NOTE — TOC Progression Note (Signed)
Transition of Care [facility]) - Progression Note    Patient Details  Name: Rakim Moone MRN: [id number] Date of Birth: 1981/03/25  Transition of Care [facility]) CM/SW Contact  Astrid Drafts Berna Spare, RN Phone Number: 04/23/2021, 4:57 PM  Clinical Narrative:    Sherron Monday with Theodoro Grist in admissions at [facility]; insurance authorization has been received, and bed is available on Tuesday, October 18. Patient discharging to [facility] at [address] in [place], [place] [address], under care of Dr. Alfredia Ferguson.  Arranged transport with [facility] for pickup on 04/27/2021 at 12 noon.  Spoke with Vince at [employer].  Updated patient and father of plan for discharge to rehab on Tuesday, and they are excited and in agreement with plan.   Expected Discharge Plan: IP Rehab Facility Barriers to Discharge: Continued Medical Work up  Expected Discharge Plan and Services Expected Discharge Plan: IP Rehab Facility   Discharge Planning Services: CM Consult                                           Social Determinants of Health (SDOH) Interventions    Readmission Risk Interventions No flowsheet data found.  Quintella Baton, RN, BSN  Trauma/Neuro ICU Case Manager [phone]

## 2021-04-24 NOTE — Progress Notes (Signed)
   Trauma/Critical Care Follow Up Note  Subjective:    Overnight Issues: No acute issues.  Pain controlled.  Objective:  Vital signs for last 24 hours: Temp:  [98.2 F (36.8 C)-98.4 F (36.9 C)] 98.3 F (36.8 C) (10/15 0522) Pulse Rate:  [91-99] 91 (10/15 0400) Resp:  [16-28] 20 (10/15 0400) BP: (110-132)/(63-76) 110/71 (10/15 0356) SpO2:  [98 %-100 %] 99 % (10/15 0400)   Intake/Output from previous day: 10/14 0701 - 10/15 0700 In: 880 [P.O.:880] Out: 2600 [Urine:2600]  Intake/Output this shift: No intake/output data recorded.    Physical Exam:  Gen: comfortable, no distress Neuro: non-focal exam, stable weakness of extremities HEENT: PERRL Neck: supple CV: RRR Pulm: unlabored breathing, CTAB Abd: soft, NT GU: foley with clear yellow urine Extr: wwp, no edema   No results found for this or any previous visit (from the past 24 hour(s)).   Assessment & Plan:  Present on Admission:  Fall, initial encounter    LOS: 13 days   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  Sensitive to Rocephin.  Rocephin 2g daily x 7 days, on day 4 of 7 today.  Anxiety - buspar started 10/5, PRN Xanax. Improving. VTE - PAS, cont LMWH  FEN - Regular  diet, imodium daily and fiber TID ID - Rocephin 2g D4/7 for UTI Dispo - ready for discharge to CIR, likely next Tuesday 10/18   Fritzi Mandes, MD Trauma & General Surgery Please use [email or web address] to contact on call provider  04/24/2021

## 2021-04-24 NOTE — Progress Notes (Signed)
Subjective: The patient is alert and pleasant.  He is encouraged that he continues to regain strength.  The plan is to send him to [facility] for rehab on Tuesday.  Objective: Vital signs in last 24 hours: Temp:  [97.8 F (36.6 C)-98.4 F (36.9 C)] 97.8 F (36.6 C) (10/15 0826) Pulse Rate:  [91-99] 98 (10/15 0826) Resp:  [16-28] 20 (10/15 0826) BP: (110-132)/(63-76) 118/76 (10/15 0826) SpO2:  [98 %-100 %] 99 % (10/15 0826) Estimated body mass index is 24.6 kg/m as calculated from the following:   Height as of this encounter: 5\' 8"  (1.727 m).   Weight as of this encounter: 73.4 kg.   Intake/Output from previous day: 10/14 0701 - 10/15 0700 In: 880 [P.O.:880] Out: 2600 [Urine:2600] Intake/Output this shift: No intake/output data recorded.  Physical exam the patient is alert and pleasant.  He is moving all 4 extremities.  He is quadriparetic.  His left lower extremity is better than his right lower extremity.  His lumbar incision has a small amount of drainage of the dressing, but does not appear particularly worrisome.  Lab Results: Recent Labs    04/22/21 0153 04/23/21 0330  WBC 16.9* 11.9*  HGB 9.2* 8.9*  HCT 28.9* 28.2*  PLT 385 433*   BMET No results for input(s): NA, K, CL, CO2, GLUCOSE, BUN, CREATININE, CALCIUM in the last 72 hours.  Studies/Results: No results found.  Assessment/Plan: Status post cervical and thoracic surgery.  The patient is making good progress.  We are awaiting rehab placement.  LOS: 13 days     04/25/21 04/24/2021, 9:44 AM     Patient ID: 04/26/2021, male   DOB: 1980-11-27, 40 y.o.   MRN: [id number]

## 2021-04-25 NOTE — Progress Notes (Signed)
Occupational Therapy Treatment Patient Details Name: Julian Allen MRN: [id number] DOB: Sep 24, 1980 Today's Date: 04/25/2021   History of present illness 40 yo male fall through ceiling landing on exercise equipment. intubated 10/2 C2-3 fx with canal stenosis with cord compression and prevertebral hematoma L1-2 fx dislocation 10/2 PLIF C2-4 L1-2 Decompression laminectomy with reduction of fx T12 L1 L2 L3 PLA with pedicle screw fixation and local autografting, PMH seizure, crohns disease   OT comments  Patient received in bed and agreeable to OT treatment.  Patient was led in scapular exercises and shoulder shrugs.  AAROM for BUE shoulder flexion and abduction while supine and AROM for elbow flexion and extension. BUE finger dexterity exercises and therapy putty exercises with yellow theraputty.  Patient states he has increased ease feeding self with red foam to build up utensils and was able to manage cup with lid and straw.  Acute OT to continue to follow.    Recommendations for follow up therapy are one component of a multi-disciplinary discharge planning process, led by the attending physician.  Recommendations may be updated based on patient status, additional functional criteria and insurance authorization.    Follow Up Recommendations  CIR;Supervision/Assistance - 24 hour    Equipment Recommendations  3 in 1 bedside commode;Wheelchair (measurements OT);Wheelchair cushion (measurements OT);Hospital bed    Recommendations for Other Services Rehab consult    Precautions / Restrictions Precautions Precautions: Fall;Cervical;Back Precaution Booklet Issued: No Precaution Comments: watch BP, foley Required Braces or Orthoses: Other Brace Other Brace: CTO, can be donned and doffed in sitting; cervical collar on while in bed (per chat text with neuro NP)       Mobility Bed Mobility                    Transfers                      Balance                                            ADL either performed or assessed with clinical judgement   ADL                                               Vision       Perception     Praxis      Cognition Arousal/Alertness: Awake/alert Behavior During Therapy: Anxious Overall Cognitive Status: Within Functional Limits for tasks assessed                                 General Comments: pleasant mood, able to follow directions        Exercises Exercises: General Upper Extremity General Exercises - Upper Extremity Shoulder Flexion: AAROM;Both;10 reps;Supine Shoulder ABduction: AAROM;Both;10 reps;Supine Elbow Flexion: AROM;Both;15 reps;Supine Wrist Flexion: AROM;Both;10 reps;Seated Wrist Extension: AROM;Both;10 reps;Seated Other Exercises Other Exercises: finger dexterity exercises to BUE Other Exercises: Therapy putty exercises for gross grasp and pinch   Shoulder Instructions       General Comments      Pertinent Vitals/ Pain       Pain Assessment: Faces Faces Pain Scale: Hurts little more Pain Location: back, shoulders bil Pain  Descriptors / Indicators: Discomfort Pain Intervention(s): Repositioned  Home Living                                          Prior Functioning/Environment              Frequency  Min 3X/week        Progress Toward Goals  OT Goals(current goals can now be found in the care plan section)  Progress towards OT goals: Progressing toward goals  Acute Rehab OT Goals Patient Stated Goal: to do whatever it takes OT Goal Formulation: With patient Time For Goal Achievement: 04/27/21 Potential to Achieve Goals: Good ADL Goals Pt Will Perform Eating: with min assist;with adaptive utensils;with assist to don/doff brace/orthosis;sitting;bed level Pt Will Perform Grooming: with mod assist;sitting;with adaptive equipment Pt Will Transfer to Toilet: with mod assist;with transfer board;bedside  commode Pt/caregiver will Perform Home Exercise Program: Increased ROM;Increased strength;Both right and left upper extremity;Independently;With written HEP provided Additional ADL Goal #1: pt will tolerate EOB for 10 minutes stable vital signs as precursor to adls. Additional ADL Goal #2: Pt will demonstrate shoulder retraction x10 reps min (A) Additional ADL Goal #3: Pt will be able to sit EOB working on sitting balance with no more than min A for balance.  Plan Discharge plan remains appropriate    Co-evaluation                 AM-PAC OT "6 Clicks" Daily Activity     Outcome Measure   Help from another person eating meals?: A Little Help from another person taking care of personal grooming?: A Lot Help from another person toileting, which includes using toliet, bedpan, or urinal?: Total Help from another person bathing (including washing, rinsing, drying)?: A Lot Help from another person to put on and taking off regular upper body clothing?: A Lot Help from another person to put on and taking off regular lower body clothing?: Total 6 Click Score: 11    End of Session    OT Visit Diagnosis: Other abnormalities of gait and mobility (R26.89);Muscle weakness (generalized) (M62.81);Pain Pain - Right/Left: Right Pain - part of body: Shoulder   Activity Tolerance Patient tolerated treatment well   Patient Left in bed;with call bell/phone within reach   Nurse Communication Other (comment) (asked about air mattress)        Time: 1443-1540 OT Time Calculation (min): 29 min  Charges: OT General Charges $OT Visit: 1 Visit OT Treatments $Neuromuscular Re-education: 23-37 mins  Alfonse Flavors, OTA Acute Rehabilitation Services  Pager [phone] Office [phone]   Dewain Penning 04/25/2021, 12:20 PM

## 2021-04-25 NOTE — Progress Notes (Signed)
Subjective: The patient is alert and pleasant.  He is in no apparent distress.  His father is at the bedside.  We are awaiting rehab placement on Tuesday.  Objective: Vital signs in last 24 hours: Temp:  [98.1 F (36.7 C)-99 F (37.2 C)] 98.1 F (36.7 C) (10/16 0758) Pulse Rate:  [86-99] 86 (10/16 0758) Resp:  [13-21] 20 (10/16 0326) BP: (111-132)/(66-73) 111/70 (10/16 0758) SpO2:  [95 %-97 %] 97 % (10/16 0758) Estimated body mass index is 24.6 kg/m as calculated from the following:   Height as of this encounter: 5\' 8"  (1.727 m).   Weight as of this encounter: 73.4 kg.   Intake/Output from previous day: 10/15 0701 - 10/16 0700 In: -  Out: 3350 [Urine:3350] Intake/Output this shift: No intake/output data recorded.  Physical exam the patient is alert and pleasant.  His exam is unchanged from yesterday.  Lab Results: Recent Labs    04/23/21 0330  WBC 11.9*  HGB 8.9*  HCT 28.2*  PLT 433*   BMET No results for input(s): NA, K, CL, CO2, GLUCOSE, BUN, CREATININE, CALCIUM in the last 72 hours.  Studies/Results: No results found.  Assessment/Plan: Status post cervical and thoracic surgeries, Quadraparesis: This patient seems to be making good progress.  We are awaiting rehab.  LOS: 14 days     04/25/21 04/25/2021, 9:19 AM     Patient ID: 04/27/2021, male   DOB: [birth year], 40 y.o.   MRN: [id number]

## 2021-04-25 NOTE — Progress Notes (Signed)
   Trauma/Critical Care Follow Up Note  Subjective:    Overnight Issues: No acute changes. Pain adequately controlled with medication. Moving all extremities.  Objective:  Vital signs for last 24 hours: Temp:  [97.8 F (36.6 C)-99 F (37.2 C)] 98.2 F (36.8 C) (10/16 0326) Pulse Rate:  [88-99] 89 (10/16 0400) Resp:  [13-21] 20 (10/16 0326) BP: (116-132)/(66-76) 116/70 (10/16 0400) SpO2:  [95 %-99 %] 96 % (10/16 0400)   Intake/Output from previous day: 10/15 0701 - 10/16 0700 In: -  Out: 3350 [Urine:3350]  Intake/Output this shift: No intake/output data recorded.    Physical Exam:  Gen: comfortable, no distress Neuro: moving all 4 extremities spontaneously, alert and oriented Neck: C collar in place CV: RRR Pulm: unlabored breathing on room air Abd: soft, NT GU: foley with clear yellow urine Extr: wwp, no edema   No results found for this or any previous visit (from the past 24 hour(s)).   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  Sensitive to Rocephin.  Rocephin 2g daily x 7 days, on day 5 of 7 today.  Anxiety - buspar started 10/5, PRN Xanax. Improving. VTE - PAS, cont LMWH  FEN - Regular diet, imodium daily and fiber TID ID - Rocephin 2g D5/7 for UTI Dispo - ready for  discharge to CIR, likely this Tuesday 10/18   Julian Mandes, MD Trauma & General Surgery Please use [email or web address] to contact on call provider  04/25/2021

## 2021-04-26 LAB — RESP PANEL BY RT-PCR (FLU A&B, COVID) ARPGX2
Influenza A by PCR: NEGATIVE
Influenza B by PCR: NEGATIVE
SARS Coronavirus 2 by RT PCR: NEGATIVE

## 2021-04-26 MED ORDER — DOXYCYCLINE HYCLATE 100 MG PO TABS
100.0000 mg | ORAL_TABLET | Freq: Two times a day (BID) | ORAL | Status: DC
  Administered 2021-04-26 – 2021-04-27 (×2): 100 mg via ORAL
  Filled 2021-04-26 (×2): qty 1

## 2021-04-26 NOTE — Progress Notes (Signed)
Progress Note  15 Days Post-Op  Subjective: Patient reports some pain in RLE today but pain medication and regimen is good. Tolerating diet. Looking forward to inpatient rehab tomorrow.   Objective: Vital signs in last 24 hours: Temp:  [98.1 F (36.7 C)-98.9 F (37.2 C)] 98.9 F (37.2 C) (10/17 0700) Pulse Rate:  [79-107] 95 (10/17 0515) Resp:  [18-20] 20 (10/17 0515) BP: (103-128)/(62-74) 118/66 (10/17 0700) SpO2:  [96 %-98 %] 98 % (10/17 0515) Last BM Date: 04/25/21  Intake/Output from previous day: 10/16 0701 - 10/17 0700 In: 1057 [P.O.:957; IV Piggyback:100] Out: 2500 [Urine:2500] Intake/Output this shift: Total I/O In: 100 [P.O.:100] Out: -   PE: Gen: comfortable, no distress Neuro: moving all 4 extremities spontaneously, alert and oriented Neck: C collar in place CV: RRR Pulm: unlabored breathing on room air Abd: soft, NT GU: foley with clear yellow urine Extr: wwp, no edema   Lab Results:  No results for input(s): WBC, HGB, HCT, PLT in the last 72 hours. BMET No results for input(s): NA, K, CL, CO2, GLUCOSE, BUN, CREATININE, CALCIUM in the last 72 hours. PT/INR No results for input(s): LABPROT, INR in the last 72 hours. CMP     Component Value Date/Time   NA 134 (L) 04/21/2021 0418   K 4.4 04/21/2021 0418   CL 99 04/21/2021 0418   CO2 25 04/21/2021 0418   GLUCOSE 125 (H) 04/21/2021 0418   BUN 17 04/21/2021 0418   CREATININE 0.76 04/21/2021 0418   CALCIUM 8.8 (L) 04/21/2021 0418   PROT 7.0 04/11/2021 0354   ALBUMIN 3.4 (L) 04/11/2021 0354   AST 38 04/11/2021 0354   ALT 23 04/11/2021 0354   ALKPHOS 64 04/11/2021 0354   BILITOT 0.9 04/11/2021 0354   GFRNONAA >60 04/21/2021 0418   Lipase  No results found for: LIPASE     Studies/Results: No results found.  Anti-infectives: Anti-infectives (From admission, onward)    Start     Dose/Rate Route Frequency Ordered Stop   04/21/21 0900  cefTRIAXone (ROCEPHIN) 2 g in sodium chloride 0.9 %  100 mL IVPB        2 g 200 mL/hr over 30 Minutes Intravenous Daily 04/21/21 0826 04/28/21 0959   04/11/21 1800  ceFAZolin (ANCEF) IVPB 2g/100 mL premix  Status:  Discontinued        2 g 200 mL/hr over 30 Minutes Intravenous Every 8 hours 04/11/21 1659 04/14/21 1606   04/11/21 1659  ceFAZolin (ANCEF) IVPB 2g/100 mL premix  Status:  Discontinued        2 g 200 mL/hr over 30 Minutes Intravenous 30 min pre-op 04/11/21 1700 04/11/21 1715   04/11/21 1255  vancomycin (VANCOCIN) powder  Status:  Discontinued          As needed 04/11/21 1310 04/11/21 1535        Assessment/Plan 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  Sensitive to Rocephin.  Rocephin 2g daily  x 7 days, last dose will be tomorrow  Anxiety - buspar started 10/5, PRN Xanax. Improving.  VTE - PAS, cont LMWH  FEN - Regular diet, imodium daily and fiber TID ID - Rocephin 2g D6/7 for UTI  Dispo - ready for discharge to [facility] inpatient rehab tomorrow   LOS: 15 days    Juliet Rude, [facility] Surgery 04/26/2021, 9:03 AM Please see Amion for pager number during day hours 7:00am-4:30pm

## 2021-04-26 NOTE — Progress Notes (Signed)
Patient ID: Julian Allen, male   DOB: September 15, 1980, 40 y.o.   MRN: [id number] I received a call From Dr. Excell Seltzer, resident at [facility], to discuss [name] clinical status. All is a go for transfer tomorrow. Violeta Gelinas, MD, MPH, FACS Please use [email or web address] to contact on call provider

## 2021-04-26 NOTE — Progress Notes (Signed)
Occupational Therapy Treatment Patient Details Name: Julian Allen MRN: [id number] DOB: Aug 08, 1980 Today's Date: 04/26/2021   History of present illness 40 yo male fall through ceiling landing on exercise equipment. intubated 10/2 C2-3 fx with canal stenosis with cord compression and prevertebral hematoma L1-2 fx dislocation 10/2 PLIF C2-4 L1-2 Decompression laminectomy with reduction of fx T12 L1 L2 L3 PLA with pedicle screw fixation and local autografting, PMH seizure, crohns disease   OT comments  Pt motivated despite discomfort of upright position. Pt in a good mood and making jokes. Pt noted to have incontinence of stool with very red skin from moisture. Pt could benefit from bowel and bladder plan to help with skin integrity. Pt noted to have saturated foam dressing at cervical area. RN called to room to observe dressing. Pt total +2 max (A) to stand pivot to chair this session. Recommendation CIR.    Recommendations for follow up therapy are one component of a multi-disciplinary discharge planning process, led by the attending physician.  Recommendations may be updated based on patient status, additional functional criteria and insurance authorization.    Follow Up Recommendations  CIR;Supervision/Assistance - 24 hour    Equipment Recommendations  3 in 1 bedside commode;Wheelchair (measurements OT);Wheelchair cushion (measurements OT);Hospital bed    Recommendations for Other Services Rehab consult    Precautions / Restrictions Precautions Precautions: Fall;Cervical;Back Precaution Booklet Issued: No Precaution Comments: watch BP, foley Required Braces or Orthoses: Other Brace Other Brace: CTO, can be donned and doffed in sitting; cervical collar on while in bed (per chat text with neuro NP) Restrictions Weight Bearing Restrictions: No       Mobility Bed Mobility Overal bed mobility: Needs Assistance Bed Mobility: Rolling;Sidelying to Sit Rolling: Mod assist;+2 for  physical assistance Sidelying to sit: Max assist;+2 for physical assistance       General bed mobility comments: mod assist for roll bilaterally for trunk and LE management, max +2 assist for trunk and LE management during log roll technique    Transfers Overall transfer level: Needs assistance Equipment used: 2 person hand held assist Transfers: Sit to/from [employer] Transfers Sit to Stand: Max assist;+2 physical assistance Stand pivot transfers: Max assist;+2 physical assistance       General transfer comment: max +2 for power up, rise, steady, LE blocking, and pivot towards pt's R into recliner. NT holding pt's chair still to avoid translation during transfer    Balance Overall balance assessment: Needs assistance Sitting-balance support: Bilateral upper extremity supported;Feet supported Sitting balance-Leahy Scale: Poor Sitting balance - Comments: at least min-mod truncal assist to maintain upright sitting Postural control: Posterior lean Standing balance support: Bilateral upper extremity supported;During functional activity Standing balance-Leahy Scale: Zero Standing balance comment: max A +2                           ADL either performed or assessed with clinical judgement   ADL Overall ADL's : Needs assistance/impaired                                       General ADL Comments: pt incontinence of bowel and no awareness. pt noted to have drainage on pillow case and bed that was dry with dark color tint. pt after hygiene sat eob for new brace don. pt noted to have wet bandage with yellow drainage continuing to weep from site. the incision  is open at the superior area.     Vision       Perception     Praxis      Cognition Arousal/Alertness: Awake/alert Behavior During Therapy: WFL for tasks assessed/performed Overall Cognitive Status: Within Functional Limits for tasks assessed                                  General Comments: follows commands well, motivated to progress mobility.        Exercises     Shoulder Instructions       General Comments pt soiled in stool upon PT and OT arrival, requires pericare assist from therapy and donning of barrier cream due to skin irritation around buttocks and perineum. Pt with heavy purulent drainage on pillow and HOB, coming from dressing over cervical incision. OT removed dressing which was placed on 10/14, RN cleaned and reapplied foam dressing over site. MD to be paged by RN.    Pertinent Vitals/ Pain       Pain Assessment: Faces Faces Pain Scale: Hurts even more Pain Location: back, neck Pain Descriptors / Indicators: Discomfort;Grimacing Pain Intervention(s): Limited activity within patient's tolerance  Home Living                                          Prior Functioning/Environment              Frequency  Min 3X/week        Progress Toward Goals  OT Goals(current goals can now be found in the care plan section)  Progress towards OT goals: Progressing toward goals  Acute Rehab OT Goals Patient Stated Goal: to do whatever it takes OT Goal Formulation: With patient Time For Goal Achievement: 05/04/21 Potential to Achieve Goals: Good ADL Goals Pt Will Perform Eating: with adaptive utensils;with assist to don/doff brace/orthosis;sitting;bed level;with min guard assist Pt Will Perform Grooming: sitting;with adaptive equipment;with min assist Pt Will Transfer to Toilet: with transfer board;bedside commode;with min assist Pt/caregiver will Perform Home Exercise Program: Increased ROM;Increased strength;Both right and left upper extremity;Independently;With written HEP provided Additional ADL Goal #1: pt will tolerate EOB for 15 minutes stable vital signs as precursor to adls. Additional ADL Goal #2: Pt will demonstrate shoulder retraction x10 reps min (A) Additional ADL Goal #3: Pt will be able to sit EOB working  on sitting balance with no more than min A for balance.  Plan Discharge plan remains appropriate    Co-evaluation    PT/OT/SLP Co-Evaluation/Treatment: Yes Reason for Co-Treatment: For patient/therapist safety;To address functional/ADL transfers PT goals addressed during session: Mobility/safety with mobility;Balance;Strengthening/ROM OT goals addressed during session: ADL's and self-care;Proper use of Adaptive equipment and DME;Strengthening/ROM      AM-PAC OT "6 Clicks" Daily Activity     Outcome Measure   Help from another person eating meals?: A Little Help from another person taking care of personal grooming?: A Lot Help from another person toileting, which includes using toliet, bedpan, or urinal?: Total Help from another person bathing (including washing, rinsing, drying)?: A Lot Help from another person to put on and taking off regular upper body clothing?: A Lot Help from another person to put on and taking off regular lower body clothing?: Total 6 Click Score: 11    End of Session Equipment Utilized During Treatment: Back brace;Cervical collar  OT Visit Diagnosis: Other abnormalities of gait and mobility (R26.89);Muscle weakness (generalized) (M62.81);Pain Pain - Right/Left: Right Pain - part of body: Shoulder   Activity Tolerance Patient tolerated treatment well   Patient Left with call bell/phone within reach;in chair;with chair alarm set   Nurse Communication Other (comment)        Time: 9179-1505 OT Time Calculation (min): 46 min  Charges: OT General Charges $OT Visit: 1 Visit OT Treatments $Self Care/Home Management : 23-37 mins   Brynn, OTR/L  Acute Rehabilitation Services Pager: [phone] Office: [phone] .   Mateo Flow 04/26/2021, 4:00 PM

## 2021-04-26 NOTE — Progress Notes (Signed)
Physical Therapy Treatment Patient Details Name: Julian Allen MRN: [id number] DOB: 12/26/1980 Today's Date: 04/26/2021   History of Present Illness 40 yo male fall through ceiling landing on exercise equipment. intubated 10/2 C2-3 fx with canal stenosis with cord compression and prevertebral hematoma L1-2 fx dislocation 10/2 PLIF C2-4 L1-2 Decompression laminectomy with reduction of fx T12 L1 L2 L3 PLA with pedicle screw fixation and local autografting, PMH seizure, crohns disease    PT Comments    Pt motivated to work with therapies. Pt soiled in stool, requiring rolling bilat for clean up with mod +2 assist, sequencing cuing provided for maintenance of spinal precautions. Pt tolerated EOB sitting x10 minutes while donning brace, and changing soiled cervical foam dressing as purulent drainage had saturated dressing and bedding, RN aware and contacting MD. Pt performed stand pivot to recliner with max +2 assist, lift pad placed for ease of transfer back to bed, ace wraps donned to lower legs for BP control. PT to continue to follow.    Recommendations for follow up therapy are one component of a multi-disciplinary discharge planning process, led by the attending physician.  Recommendations may be updated based on patient status, additional functional criteria and insurance authorization.  Follow Up Recommendations  Other (comment) (inpatient rehab at [facility])     Geophysical data processor (measurements PT);Wheelchair cushion (measurements PT);Hospital bed;Other (comment) (18x18 tilt in space)    Recommendations for Other Services       Precautions / Restrictions Precautions Precautions: Fall;Cervical;Back Precaution Booklet Issued: No Precaution Comments: watch BP, foley Required Braces or Orthoses: Other Brace Other Brace: CTO, can be donned and doffed in sitting; cervical collar on while in bed (per chat text with neuro NP) Restrictions Weight Bearing Restrictions: No      Mobility  Bed Mobility Overal bed mobility: Needs Assistance Bed Mobility: Rolling;Sidelying to Sit Rolling: Mod assist;+2 for physical assistance Sidelying to sit: Max assist;+2 for physical assistance       General bed mobility comments: mod assist for roll bilaterally for trunk and LE management, max +2 assist for trunk and LE management during log roll technique    Transfers Overall transfer level: Needs assistance Equipment used: 2 person hand held assist Transfers: Sit to/from [employer] Transfers Sit to Stand: Max assist;+2 physical assistance Stand pivot transfers: Max assist;+2 physical assistance       General transfer comment: max +2 for power up, rise, steady, LE blocking, and pivot towards pt's R into recliner. NT holding pt's chair still to avoid translation during transfer  Ambulation/Gait                 Stairs             Wheelchair Mobility    Modified Rankin (Stroke Patients Only)       Balance Overall balance assessment: Needs assistance Sitting-balance support: Bilateral upper extremity supported;Feet supported Sitting balance-Leahy Scale: Poor Sitting balance - Comments: at least min-mod truncal assist to maintain upright sitting Postural control: Posterior lean Standing balance support: Bilateral upper extremity supported;During functional activity Standing balance-Leahy Scale: Zero Standing balance comment: max A +2                            Cognition Arousal/Alertness: Awake/alert Behavior During Therapy: WFL for tasks assessed/performed Overall Cognitive Status: Within Functional Limits for tasks assessed  General Comments: follows commands well, motivated to progress mobility.      Exercises      General Comments General comments (skin integrity, edema, etc.): pt soiled in stool upon PT and OT arrival, requires pericare assist from therapy and donning  of barrier cream due to skin irritation around buttocks and perineum. Pt with heavy purulent drainage on pillow and HOB, coming from dressing over cervical incision. OT removed dressing which was placed on 10/14, RN cleaned and reapplied foam dressing over site. MD to be paged by RN.      Pertinent Vitals/Pain Pain Assessment: Faces Faces Pain Scale: Hurts even more Pain Location: back, neck Pain Descriptors / Indicators: Discomfort;Grimacing Pain Intervention(s): Limited activity within patient's tolerance;Monitored during session;Repositioned    Home Living                      Prior Function            PT Goals (current goals can now be found in the care plan section) Acute Rehab PT Goals Patient Stated Goal: to do whatever it takes PT Goal Formulation: With patient Time For Goal Achievement: 04/27/21 Potential to Achieve Goals: Good Progress towards PT goals: Progressing toward goals    Frequency    Min 4X/week      PT Plan Current plan remains appropriate    Co-evaluation PT/OT/SLP Co-Evaluation/Treatment: Yes Reason for Co-Treatment: For patient/therapist safety;Necessary to address cognition/behavior during functional activity;To address functional/ADL transfers;Complexity of the patient's impairments (multi-system involvement) PT goals addressed during session: Mobility/safety with mobility;Balance;Strengthening/ROM        AM-PAC PT "6 Clicks" Mobility   Outcome Measure  Help needed turning from your back to your side while in a flat bed without using bedrails?: A Lot Help needed moving from lying on your back to sitting on the side of a flat bed without using bedrails?: Total Help needed moving to and from a bed to a chair (including a wheelchair)?: Total Help needed standing up from a chair using your arms (e.g., wheelchair or bedside chair)?: Total Help needed to walk in hospital room?: Total Help needed climbing 3-5 steps with a railing? :  Total 6 Click Score: 7    End of Session Equipment Utilized During Treatment: Back brace;Cervical collar Activity Tolerance: Patient tolerated treatment well Patient left: in chair;with call bell/phone within reach;with nursing/sitter in room;Other (comment) (maximove pad placed under pt for transfer back to bed.) Nurse Communication: Mobility status;Need for lift equipment PT Visit Diagnosis: Other abnormalities of gait and mobility (R26.89);History of falling (Z91.81);Muscle weakness (generalized) (M62.81);Pain Pain - Right/Left: Right Pain - part of body: Shoulder     Time: 0932-3557 PT Time Calculation (min) (ACUTE ONLY): 45 min  Charges:  $Therapeutic Activity: 8-22 mins                     Marye Round, PT DPT Acute Rehabilitation Services Pager [phone]  Office [phone]    Tyrone Apple E Christain [place] 04/26/2021, 2:17 PM

## 2021-04-26 NOTE — Discharge Summary (Signed)
Physician Discharge Summary  Patient ID: Julian Allen MRN: [id number] DOB/AGE: [birth year] 42 y.o.  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 Julio Sicks   Decompressive laminectomy and T12-L3 posterior lateral fusion - (04/11/21) Dr. Julio Sicks   HPI: Patient is a 40 y.o. male whom arrived as level 2 activation s/p fall through ceiling at home and landing on exercise equipment. Fell approximately 10 ft. He was amnestic to the events. Arrived complaining of back pain. Underwent workup in ED and then was upgraded to level 1 when he was not breathing or moving. Did not loose pulses. Reportedly flaccid paralysis, bag mask initiated and intubated. Workup in the ED revealing for C2-3 fractures and L1-2 fractures. Patient transfused 2 units PRBC.   Hospital Course: Patient was admitted to the trauma ICU and neurosurgery was consulted. Neurosurgery took patient to the OR as listed above and patient was taken back to the ICU post-operatively. Patient had some neurogenic shock post-operatively that resolved. Patient developed post-operative ileus which also resolved. CTA neck negative for vascular injury 10/3. Patient transfused 2 unit PRBC for ABL anemia over 10/4-10/5. Patient extubated 10/5 and tolerated well. Patient worked with SLP and was able to pass for diet 10/6, this was advanced as swallowing function improved. Patient was evaluated by PT/OT throughout admission and inpatient rehab was recommended. Hospitalization complicated by UTI with hematuria, urine culture was sent and grew Serratia marcescens. Antibiotics were discussed with pharmacy and patient  to complete last dose of antibiotics for this on day of discharge. Patient noted to have some dehiscence of cervical wound 10/17 and neurosurgery recommend 7 days PO antibiotics, this will be prescribed upon discharge.   On 04/27/21 patient was tolerating a diet, voiding appropriately, VSS, pain well controlled, and overall felt stable for discharge to inpatient rehab. He should follow up with neurosurgery once discharged from rehab.   PE: Gen: comfortable, no distress Neuro: moving all 4 extremities spontaneously, alert and oriented Neck: C collar in place CV: RRR Pulm: unlabored breathing on room air Abd: soft, NT GU: foley with clear yellow urine Extr: wwp, no edema  Allergies as of 04/27/2021   No Known Allergies      Medication List     TAKE these medications    acetaminophen 325 MG tablet Commonly known as: TYLENOL Take 2 tablets (650 mg total) by mouth every 6 (six) hours.   ALPRAZolam 0.25 MG tablet Commonly known as: XANAX Take 1 tablet (0.25 mg total) by mouth 3 (three) times daily as needed for anxiety.   azaTHIOprine 50 MG tablet Commonly known as: IMURAN Take 150 mg by mouth daily.   bethanechol 25 MG tablet Commonly known as: URECHOLINE Take 1 tablet (25 mg total) by mouth 3 (three) times daily.   busPIRone 15 MG tablet Commonly known as: BUSPAR Take 1 tablet (15 mg total) by mouth 3 (three) times daily.   docusate 50 MG/5ML liquid Commonly known as: COLACE Take 10 mLs (100 mg total) by mouth daily.   doxycycline 100 MG tablet Commonly known as: VIBRA-TABS Take 1 tablet (100 mg total) by mouth every 12 (twelve) hours for 7 days.   enoxaparin 30 MG/0.3ML injection Commonly known as: LOVENOX Inject 0.3 mLs (30 mg  total) into the skin 2 (two) times daily.   feeding supplement Liqd Take 237 mLs by mouth 3 (three) times daily between meals.   fiber Pack packet Take 1 packet by mouth 3 (three) times daily.   Humira Pen 40 MG/0.4ML Pnkt Generic  drug: Adalimumab Inject 40 mg into the skin once a week.   levETIRAcetam 500 MG tablet Commonly known as: KEPPRA Take 500 mg by mouth 2 (two) times daily.   loperamide 2 MG capsule Commonly known as: IMODIUM Take 1 capsule (2 mg total) by mouth daily.   methocarbamol 500 MG tablet Commonly known as: ROBAXIN Take 2 tablets (1,000 mg total) by mouth every 8 (eight) hours.   multivitamin with minerals Tabs tablet Take 1 tablet by mouth daily.   oxyCODONE 5 MG immediate release tablet Commonly known as: Oxy IR/ROXICODONE Take 1-2 tablets (5-10 mg total) by mouth every 4 (four) hours as needed for moderate pain or severe pain.   tamsulosin 0.4 MG Caps capsule Commonly known as: FLOMAX Take 1 capsule (0.4 mg total) by mouth daily.          Follow-up Information     Julio Sicks, MD. Schedule an appointment as soon as possible for a visit.   Specialty: Neurosurgery Why: Once discharged from inpatient rehab Contact information: [address] [place] [address] [phone]                 Signed: Juliet Rude , [facility] 04/27/2021, 8:14 AM Please see Amion for pager number during day hours 7:00am-4:30pm

## 2021-04-26 NOTE — TOC Progression Note (Signed)
Transition of Care [facility]) - Progression Note    Patient Details  Name: Julian Allen MRN: [id number] Date of Birth: Jul 09, 1981  Transition of Care [facility]) CM/SW Contact  Glennon Mac, RN Phone Number: 04/26/2021, 10:22 AM  Clinical Narrative:    Pt for admission to [facility] on Tuesday, with transport provided by [employer].  Faxed updated MAR, MD notes and therapy notes to [facility], admissions coordinator for [facility].    Expected Discharge Plan: IP Rehab Facility Barriers to Discharge: Continued Medical Work up  Expected Discharge Plan and Services Expected Discharge Plan: IP Rehab Facility   Discharge Planning Services: CM Consult                                           Social Determinants of Health (SDOH) Interventions    Readmission Risk Interventions No flowsheet data found.  Quintella Baton, RN, BSN  Trauma/Neuro ICU Case Manager [phone]

## 2021-04-27 ENCOUNTER — Ambulatory Visit
Admit: 2021-04-27 | Discharge: 2021-05-04 | Disposition: A | Discharge status: Still In Hospital | Payer: PRIVATE HEALTH INSURANCE | Source: Other Acute Inpatient Hospital | Admitting: Spinal Cord Injury Medicine

## 2021-04-27 ENCOUNTER — Ambulatory Visit
Admit: 2021-04-27 | Discharge: 2021-05-04 | Disposition: A | Discharge status: Other Acute Inpatient Hospital | Payer: PRIVATE HEALTH INSURANCE | Source: Other Acute Inpatient Hospital | Admitting: Spinal Cord Injury Medicine

## 2021-04-27 MED ORDER — TAMSULOSIN HCL 0.4 MG PO CAPS: 0.4000 mg | ORAL_CAPSULE | Freq: Every day | ORAL | Status: DC

## 2021-04-27 MED ORDER — ADULT MULTIVITAMIN W/MINERALS CH: 1.0000 | ORAL_TABLET | Freq: Every day | ORAL | Status: DC

## 2021-04-27 MED ORDER — SODIUM CHLORIDE 0.9 % IV SOLN
2.0000 g | Freq: Every day | INTRAVENOUS | Status: AC
  Administered 2021-04-27: 2 g via INTRAVENOUS
  Filled 2021-04-27: qty 20

## 2021-04-27 MED ORDER — DOXYCYCLINE HYCLATE 100 MG PO TABS: 100.0000 mg | ORAL_TABLET | Freq: Two times a day (BID) | ORAL | Status: DC

## 2021-04-27 MED ORDER — ACETAMINOPHEN 325 MG PO TABS: 650.0000 mg | ORAL_TABLET | Freq: Four times a day (QID) | ORAL | Status: DC

## 2021-04-27 MED ORDER — ALPRAZOLAM 0.25 MG PO TABS: 0.2500 mg | ORAL_TABLET | Freq: Three times a day (TID) | ORAL | 0 refills | Status: DC | PRN

## 2021-04-27 MED ORDER — OXYCODONE HCL 5 MG PO TABS: 5.0000 mg | ORAL_TABLET | ORAL | 0 refills | Status: DC | PRN

## 2021-04-27 MED ORDER — METHOCARBAMOL 500 MG PO TABS: 1000.0000 mg | ORAL_TABLET | Freq: Three times a day (TID) | ORAL | Status: DC

## 2021-04-27 MED ORDER — DOCUSATE SODIUM 50 MG/5ML PO LIQD: 100.0000 mg | Freq: Every day | ORAL | 0 refills | Status: DC

## 2021-04-27 MED ORDER — ENSURE ENLIVE PO LIQD: 237.0000 mL | Freq: Three times a day (TID) | ORAL | 12 refills | Status: DC

## 2021-04-27 MED ORDER — ENOXAPARIN SODIUM 30 MG/0.3ML IJ SOSY: 30.0000 mg | PREFILLED_SYRINGE | Freq: Two times a day (BID) | INTRAMUSCULAR | Status: DC

## 2021-04-27 MED ORDER — LOPERAMIDE HCL 2 MG PO CAPS: 2.0000 mg | ORAL_CAPSULE | Freq: Every day | ORAL | 0 refills | Status: DC

## 2021-04-27 MED ORDER — BETHANECHOL CHLORIDE 25 MG PO TABS: 25.0000 mg | ORAL_TABLET | Freq: Three times a day (TID) | ORAL | Status: DC

## 2021-04-27 MED ORDER — NUTRISOURCE FIBER PO PACK: 1.0000 | PACK | Freq: Three times a day (TID) | ORAL | Status: DC

## 2021-04-27 MED ORDER — BUSPIRONE HCL 15 MG PO TABS: 15.0000 mg | ORAL_TABLET | Freq: Three times a day (TID) | ORAL | Status: DC

## 2021-04-27 NOTE — TOC Transition Note (Signed)
Transition of Care [facility]) - CM/SW Discharge Note   Patient Details  Name: Julian Allen MRN: [id number] Date of Birth: 05/05/1981  Transition of Care [facility]) CM/SW Contact:  Glennon Mac, RN Phone Number: 04/27/2021, 10:08 AM   Clinical Narrative:    Pt medically stable for discharge and has been accepted for admission to [facility] today. Plan transport by [employer] scheduled for pickup at [facility] today.  Discharge address is [address] in [place], [place] [address].  Bedside nurse to call report to facility, phone # [phone].  Patient dc to room 4H216 at facility.     Final next level of care: IP Rehab Facility Barriers to Discharge: Barriers Resolved   Patient Goals and CMS Choice Patient states their goals for this hospitalization and ongoing recovery are:: to get better CMS Medicare.gov Compare Post Acute Care list provided to:: Patient Choice offered to / list presented to : Patient, Parent  Discharge Placement [facility] [place], [place]                     Discharge Plan and Services   Discharge Planning Services: CM Consult                                 Social Determinants of Health (SDOH) Interventions     Readmission Risk Interventions No flowsheet data found.  Quintella Baton, RN, BSN  Trauma/Neuro ICU Case Manager [phone]

## 2021-04-27 NOTE — Progress Notes (Signed)
   Providing Compassionate, Quality Care - Together  Received call from bedside RN regarding drainage from posterior cervical surgical wound. Dressing was saturated with serosanguinous drainage. The wound has a one inch area of superficial dehiscence at the superior portion of the wound. It is without erythema or edema. The patient has remained afebrile. His white count has actually improved. Bedside RN redressed surgical wound. The patient is already on ceftriaxone for UTI. Added a seven day course of doxycycline. Dressing should be changed twice daily and as needed to keep surgical wound dry.    Val Eagle, DNP, AGNP-C Nurse Practitioner 04/26/2021, 2:57 PM  [facility] [address], [address], [place], [place] [address] P: [phone]    F: [phone]

## 2021-05-03 ENCOUNTER — Inpatient Hospital Stay (HOSPITAL_COMMUNITY)
Admission: EM | Admit: 2021-05-03 | Discharge: 2021-05-11 | DRG: 862 | Disposition: A | Discharge status: Rehabilitation Facility | Payer: BLUE CROSS/BLUE SHIELD | Source: Other Acute Inpatient Hospital | Attending: Neurosurgery | Admitting: Neurosurgery

## 2021-05-03 DIAGNOSIS — K5090 Crohn's disease, unspecified, without complications: Secondary | ICD-10-CM | POA: Diagnosis present

## 2021-05-03 DIAGNOSIS — T8149XA Infection following a procedure, other surgical site, initial encounter: Secondary | ICD-10-CM | POA: Diagnosis present

## 2021-05-03 DIAGNOSIS — Z20822 Contact with and (suspected) exposure to covid-19: Secondary | ICD-10-CM | POA: Diagnosis present

## 2021-05-03 DIAGNOSIS — M542 Cervicalgia: Secondary | ICD-10-CM | POA: Diagnosis present

## 2021-05-03 DIAGNOSIS — T8141XA Infection following a procedure, superficial incisional surgical site, initial encounter: Principal | ICD-10-CM | POA: Diagnosis present

## 2021-05-03 DIAGNOSIS — L89152 Pressure ulcer of sacral region, stage 2: Secondary | ICD-10-CM | POA: Diagnosis present

## 2021-05-03 DIAGNOSIS — Z6821 Body mass index (BMI) 21.0-21.9, adult: Secondary | ICD-10-CM

## 2021-05-03 DIAGNOSIS — E43 Unspecified severe protein-calorie malnutrition: Secondary | ICD-10-CM | POA: Diagnosis present

## 2021-05-03 DIAGNOSIS — Z9181 History of falling: Secondary | ICD-10-CM

## 2021-05-03 DIAGNOSIS — Z888 Allergy status to other drugs, medicaments and biological substances status: Secondary | ICD-10-CM

## 2021-05-03 DIAGNOSIS — L89512 Pressure ulcer of right ankle, stage 2: Secondary | ICD-10-CM | POA: Diagnosis present

## 2021-05-03 DIAGNOSIS — Y838 Other surgical procedures as the cause of abnormal reaction of the patient, or of later complication, without mention of misadventure at the time of the procedure: Secondary | ICD-10-CM | POA: Diagnosis present

## 2021-05-03 DIAGNOSIS — L8990 Pressure ulcer of unspecified site, unspecified stage: Secondary | ICD-10-CM | POA: Insufficient documentation

## 2021-05-03 MED ORDER — CHLORHEXIDINE GLUCONATE CLOTH 2 % EX PADS
6.0000 | MEDICATED_PAD | Freq: Every day | CUTANEOUS | Status: DC
  Administered 2021-05-04: 6 via TOPICAL

## 2021-05-03 MED ORDER — MUPIROCIN 2 % EX OINT
1.0000 "application " | TOPICAL_OINTMENT | Freq: Two times a day (BID) | CUTANEOUS | Status: AC
  Administered 2021-05-04 – 2021-05-08 (×8): 1 via NASAL
  Filled 2021-05-03: qty 22

## 2021-05-03 MED ORDER — AZATHIOPRINE 50 MG TABLET
ORAL_TABLET | 0 refills | 0 days | Status: CP
Start: 2021-05-03 — End: ?

## 2021-05-03 MED ORDER — BUSPIRONE 15 MG TABLET
ORAL_TABLET | Freq: Three times a day (TID) | ORAL | 0 refills | 30.00000 days | Status: CP
Start: 2021-05-03 — End: 2021-06-02

## 2021-05-04 ENCOUNTER — Other Ambulatory Visit: Payer: Self-pay

## 2021-05-04 DIAGNOSIS — L8990 Pressure ulcer of unspecified site, unspecified stage: Secondary | ICD-10-CM | POA: Insufficient documentation

## 2021-05-04 DIAGNOSIS — T8149XA Infection following a procedure, other surgical site, initial encounter: Secondary | ICD-10-CM | POA: Diagnosis present

## 2021-05-04 LAB — SURGICAL PCR SCREEN
MRSA, PCR: NEGATIVE
Staphylococcus aureus: NEGATIVE

## 2021-05-04 MED ORDER — LEVETIRACETAM 500 MG PO TABS
500.0000 mg | ORAL_TABLET | Freq: Two times a day (BID) | ORAL | Status: DC
  Administered 2021-05-04 – 2021-05-11 (×15): 500 mg via ORAL
  Filled 2021-05-04 (×15): qty 1

## 2021-05-04 MED ORDER — ENOXAPARIN SODIUM 40 MG/0.4ML IJ SOSY
40.0000 mg | PREFILLED_SYRINGE | INTRAMUSCULAR | Status: DC
  Administered 2021-05-04 – 2021-05-11 (×8): 40 mg via SUBCUTANEOUS
  Filled 2021-05-04 (×8): qty 0.4

## 2021-05-04 MED ORDER — ADULT MULTIVITAMIN W/MINERALS CH
1.0000 | ORAL_TABLET | Freq: Every day | ORAL | Status: DC
  Administered 2021-05-04 – 2021-05-11 (×8): 1 via ORAL
  Filled 2021-05-04 (×8): qty 1

## 2021-05-04 MED ORDER — SODIUM CHLORIDE 0.9 % IV SOLN: INTRAVENOUS | Status: DC

## 2021-05-04 MED ORDER — DOCUSATE SODIUM 100 MG PO CAPS
100.0000 mg | ORAL_CAPSULE | Freq: Every day | ORAL | Status: DC
  Administered 2021-05-05 – 2021-05-10 (×5): 100 mg via ORAL
  Filled 2021-05-04 (×6): qty 1

## 2021-05-04 MED ORDER — ALPRAZOLAM 0.25 MG PO TABS
0.2500 mg | ORAL_TABLET | Freq: Three times a day (TID) | ORAL | Status: DC | PRN
  Administered 2021-05-04: 0.25 mg via ORAL
  Filled 2021-05-04 (×2): qty 1

## 2021-05-04 MED ORDER — NUTRISOURCE FIBER PO PACK
1.0000 | PACK | Freq: Three times a day (TID) | ORAL | Status: DC
  Filled 2021-05-04 (×3): qty 1

## 2021-05-04 MED ORDER — ENSURE ENLIVE PO LIQD
237.0000 mL | Freq: Two times a day (BID) | ORAL | Status: DC
  Administered 2021-05-05 – 2021-05-11 (×13): 237 mL via ORAL

## 2021-05-04 MED ORDER — AZATHIOPRINE 50 MG PO TABS
150.0000 mg | ORAL_TABLET | Freq: Every day | ORAL | Status: DC
  Administered 2021-05-04 – 2021-05-11 (×8): 150 mg via ORAL
  Filled 2021-05-04 (×8): qty 3

## 2021-05-04 MED ORDER — DOXYCYCLINE HYCLATE 100 MG PO TABS
100.0000 mg | ORAL_TABLET | Freq: Two times a day (BID) | ORAL | Status: DC
  Administered 2021-05-04 – 2021-05-08 (×9): 100 mg via ORAL
  Filled 2021-05-04 (×10): qty 1

## 2021-05-04 MED ORDER — HYDROCODONE-ACETAMINOPHEN 5-325 MG PO TABS
1.0000 | ORAL_TABLET | ORAL | Status: DC | PRN
  Administered 2021-05-04 (×3): 2 via ORAL
  Administered 2021-05-04: 1 via ORAL
  Administered 2021-05-04 – 2021-05-11 (×37): 2 via ORAL
  Filled 2021-05-04 (×26): qty 2
  Filled 2021-05-04: qty 1
  Filled 2021-05-04 (×15): qty 2

## 2021-05-04 MED ORDER — ACETAMINOPHEN 325 MG PO TABS: 650.0000 mg | ORAL_TABLET | Freq: Four times a day (QID) | ORAL | Status: DC | PRN

## 2021-05-04 MED ORDER — NUTRISOURCE FIBER PO PACK
1.0000 | PACK | Freq: Three times a day (TID) | ORAL | Status: DC
  Administered 2021-05-04 – 2021-05-11 (×21): 1 via ORAL
  Filled 2021-05-04 (×23): qty 1

## 2021-05-04 MED ORDER — ACETAMINOPHEN 650 MG RE SUPP: 650.0000 mg | Freq: Four times a day (QID) | RECTAL | Status: DC | PRN

## 2021-05-04 MED ORDER — METHOCARBAMOL 1000 MG/10ML IJ SOLN
500.0000 mg | Freq: Four times a day (QID) | INTRAVENOUS | Status: DC | PRN
  Administered 2021-05-04 – 2021-05-05 (×2): 500 mg via INTRAVENOUS
  Filled 2021-05-04 (×2): qty 500

## 2021-05-04 MED ORDER — TAMSULOSIN HCL 0.4 MG PO CAPS
0.4000 mg | ORAL_CAPSULE | Freq: Every day | ORAL | Status: DC
  Administered 2021-05-04 – 2021-05-11 (×8): 0.4 mg via ORAL
  Filled 2021-05-04 (×8): qty 1

## 2021-05-04 MED ORDER — ADALIMUMAB 40 MG/0.4ML ~~LOC~~ PSKT: 40.0000 mg | PREFILLED_SYRINGE | SUBCUTANEOUS | Status: DC

## 2021-05-04 MED ORDER — BUSPIRONE HCL 10 MG PO TABS
15.0000 mg | ORAL_TABLET | Freq: Three times a day (TID) | ORAL | Status: DC
  Administered 2021-05-04 – 2021-05-11 (×22): 15 mg via ORAL
  Filled 2021-05-04 (×6): qty 2
  Filled 2021-05-04: qty 1
  Filled 2021-05-04: qty 2
  Filled 2021-05-04: qty 1
  Filled 2021-05-04 (×7): qty 2
  Filled 2021-05-04: qty 1
  Filled 2021-05-04 (×5): qty 2

## 2021-05-04 NOTE — Progress Notes (Signed)
Patient transferred back from [facility] rehabilitation.  Patient continues to make remarkable improvement following upper cervical spinal cord injury following C2-3 Chance fracture and L1 1 L2 chance distraction fracture.  Patient has had intermittent drainage from both his cervical and lumbar wound but the cervical wound has been more impressive by report.  The patient has not been febrile.  He is showing no signs of sepsis.  He has been on suppression therapy with oral doxycycline.  He denies neck pain.  He is having no radiating pain.  Neurologically he is doing remarkably well.  He is awake and alert.  He is oriented and appropriate.  Motor examination of the upper extremities with 4+/5 strength bilaterally with reasonably good dexterity.  Motor examination of the lower extremities reveals 4/5 strength in his left lower extremity and 4-/5 strength in her right lower extremity which is all much improved from when he was discharged here a few days ago.  Examining his wounds.  His cervical wound currently appears to be essentially closed.  There is some scant drainage on his dressing but I cannot express or note any significant skin openings or current drainage currently.  There is absolutely no erythema.  There is no wound swelling or bogginess.  Lumbar wound is healing reasonably well.  There is some minor areas of eschar on the surface but no areas of significant wound breakdown and no evidence of drainage or surrounding erythema or fluid collection.  I have no doubt this patient's wound has been intermittently draining while he has been in rehab.  Currently I do not see any indication of any significant deep cervical wound infection i.e. no fever no pain no evidence of sepsis.  He may have had some intermittent drainage from a loculated seroma which is now drained.  I do not see any indication to reopen his wound at this point.  We will plan for hospital admission with observation of his wounds and local  wound care as needed.  We will continue with suppression therapy with doxycycline.

## 2021-05-04 NOTE — H&P (Signed)
Julian Allen is an 40 y.o. male.   HPI:  40 year old male presented to [facility] today with drainage from his posterior cervical wound. Dr. Jordan Likes took him to the OR at the beginning of the month for a cervical and thoracolumbar fracture after falling through the floor of an attic. He has been in a rehab facility for the last week. He complains of drainage from his posterior cervical wound. Denies any fevers or chills. Reports some neck pain. He was transferred here via carelink from [facility] for possible wound washout this week.  Past Medical History:  Diagnosis Date   Crohn's disease (HCC)    Seizures (HCC)    Spondylitis (HCC)     Past Surgical History:  Procedure Laterality Date   POSTERIOR CERVICAL FUSION/FORAMINOTOMY N/A 04/11/2021   Procedure: Cervical Two - Cervical Four Laminectomy and Posterior Fusion With Instrumentation;  Surgeon: Julio Sicks, MD;  Location: [facility] OR;  Service: Neurosurgery;  Laterality: N/A;   SPINE SURGERY      Allergies  Allergen Reactions   Remicade [Infliximab] Other (See Comments)    Seizures     Social History   Tobacco Use   Smoking status: Never    Passive exposure: Past   Smokeless tobacco: Never  Substance Use Topics   Alcohol use: Not Currently    Comment: Occ    No family history on file.   Review of Systems  Positive ROS: as above  All other systems have been reviewed and were otherwise negative with the exception of those mentioned in the HPI and as above.  Objective: Vital signs in last 24 hours: Temp:  [99.4 F (37.4 C)] 99.4 F (37.4 C) (10/24 2300) Pulse Rate:  [82] 82 (10/24 2300) Resp:  [14] 14 (10/24 2300) BP: (113)/(69) 113/69 (10/24 2300) SpO2:  [97 %] 97 % (10/24 2300) Weight:  [63.5 kg] 63.5 kg (10/24 2300)  General Appearance: Alert, cooperative, no distress, appears stated age Head: Normocephalic, without obvious abnormality, atraumatic Eyes: PERRL, conjunctiva/corneas clear, EOM's intact, fundi benign, both eyes       Neck: Supple, symmetrical, trachea midline, no adenopathy; thyroid: No enlargement/tenderness/nodules; no carotid bruit or JVD, collar donned Lungs: respirations unlabored Heart: Regular rate and rhythm, S1 and S2 normal, no murmur, rub or gallop Extremities: Extremities normal, atraumatic, no cyanosis or edema Pulses: 2+ and symmetric all extremities Skin: Skin color, texture, turgor normal, no rashes or lesions  NEUROLOGIC:   Mental status: A&O x4, no aphasia, good attention span, Memory and fund of knowledge Motor Exam - grossly normal, normal tone and bulk Sensory Exam - grossly normal Reflexes: symmetric, no pathologic reflexes, No Hoffman's, No clonus Coordination - grossly normal Gait - not tested Balance - not tested Cranial Nerves: I: smell Not tested  II: visual acuity  OS: na    OD: na  II: visual fields Full to confrontation  II: pupils Equal, round, reactive to light  III,VII: ptosis None  III,IV,VI: extraocular muscles  Full ROM  V: mastication   V: facial light touch sensation    V,VII: corneal reflex    VII: facial muscle function - upper    VII: facial muscle function - lower   VIII: hearing   IX: soft palate elevation    IX,X: gag reflex   XI: trapezius strength    XI: sternocleidomastoid strength   XI: neck flexion strength    XII: tongue strength      Data Review Lab Results  Component Value Date  WBC 11.9 (H) 04/23/2021   HGB 8.9 (L) 04/23/2021   HCT 28.2 (L) 04/23/2021   MCV 96.9 04/23/2021   PLT 433 (H) 04/23/2021   Lab Results  Component Value Date   NA 134 (L) 04/21/2021   K 4.4 04/21/2021   CL 99 04/21/2021   CO2 25 04/21/2021   BUN 17 04/21/2021   CREATININE 0.76 04/21/2021   GLUCOSE 125 (H) 04/21/2021   Lab Results  Component Value Date   INR 1.1 04/11/2021     Assessment/Plan: 40 year old male transferred here from [facility] with posterior cervical wound drainage. The patient is neuro intact and does not appear to be septic.  Reported to [place] that Dr. Jordan Likes is planning to do a wound washout with repeat cultures. Will make him aware of patients arrival tomorrow.   Tiana Loft Mystique Bjelland 05/04/2021 12:02 AM

## 2021-05-04 NOTE — Progress Notes (Signed)
Initial Nutrition Assessment  DOCUMENTATION CODES:   Severe malnutrition in context of acute illness/injury  INTERVENTION:   Continue multivitamin w/ minerals daily  Ensure Enlive po BID, each supplement provides 350 kcal and 20 grams of protein  Encourage good PO intake  NUTRITION DIAGNOSIS:   Severe Malnutrition related to acute illness as evidenced by percent weight loss, severe muscle depletion, severe fat depletion.  GOAL:   Patient will meet greater than or equal to 90% of their needs  MONITOR:   PO intake, Supplement acceptance, Weight trends, Skin  REASON FOR ASSESSMENT:   Malnutrition Screening Tool    ASSESSMENT:   40 y.o. male transferred from [facility] for posterior cervical wound drainage. Pt recently admitted to [facility] after fall resulting in cervical and lumbar fractures; pt discharged to rehab on 10/18. PMH includes Crohn's disease.   Per Dr. Lindalou Hose note, he does not suspect sepsis; incisions with scant drainage but no openings, does not want to reopen incision.   Patients father was at bedside at time of visit.   Pt reports that his appetite has been great, feels like he never really lost his appetite with his fall. Both pt and father report that he has been eating like a horse. Pt reports that he does not use any ONS at rehab.   Pt reports that he does not think that he has lost weight, but he has not been able to look in a mirror. Per EMR, pt has had a 6% weight loss in ~2 weeks; this is significant for time frame. Pt reports that he is not moving very much at rehab, but he has started to work with therapy and they are teaching him how to use a powered wheelchair.  Discussed ONS with pt; pt agreeable to Ensure.   Medications reviewed and include: Colace, Doxycycline, Fiber, MVI  Labs reviewed.  NUTRITION - FOCUSED PHYSICAL EXAM:  Flowsheet Row Most Recent Value  Orbital Region Severe depletion  Upper Arm Region Moderate depletion  Thoracic and Lumbar  Region Moderate depletion  Buccal Region Severe depletion  Temple Region Severe depletion  Clavicle Bone Region Severe depletion  Clavicle and Acromion Bone Region Severe depletion  Scapular Bone Region Severe depletion  Dorsal Hand Severe depletion  Patellar Region Severe depletion  Anterior Thigh Region Severe depletion  Posterior Calf Region Severe depletion  [R leg boot]  Edema (RD Assessment) None  Hair Reviewed  Eyes Reviewed  Mouth Reviewed  Skin Reviewed  Nails Reviewed       Diet Order:   Diet Order             Diet regular Room service appropriate? Yes; Fluid consistency: Thin  Diet effective now                   EDUCATION NEEDS:   No education needs have been identified at this time  Skin:  Skin Integrity Issues:: Incisions, Stage II Stage II: Coccyx & R Ankle Incisions: Neck & Back  Last BM:  05/03/2021  Height:   Ht Readings from Last 1 Encounters:  05/04/21 5\' 8"  (1.727 m)    Weight:   Wt Readings from Last 1 Encounters:  05/04/21 63.5 kg    Ideal Body Weight:  70 kg  BMI:  Body mass index is 21.29 kg/m.  Estimated Nutritional Needs:   Kcal:  2200-2400  Protein:  110-125 grams  Fluid:  >/= 2 L    Evita Merida 05/06/21, PLDN Clinical Dietitian See AMiON for contact  information.

## 2021-05-04 NOTE — Progress Notes (Signed)
Patient turned and neck incision assessed with Dr. Jordan Likes. Old purulent drainage noted on dressing. No active drainage from incision at this time. Incision is appropriate color and no edema. New foam applied. Per Dr Jordan Likes, no surgical intervention needed at this time. Patient ok to eat a regular diet and be transferred out of ICU.  Sherral Hammers, RN

## 2021-05-05 DIAGNOSIS — E43 Unspecified severe protein-calorie malnutrition: Secondary | ICD-10-CM | POA: Insufficient documentation

## 2021-05-05 NOTE — Progress Notes (Signed)
Overall continues to do well.  No new neck or back pain.  No new radiating symptoms.  Continues to slowly regain function in both arms and legs.  He is afebrile.  His vital signs are stable.  His cervical wound appears to be well-healed.  There is no evidence of any discharge or drainage on the bandage today.  Lumbar wound also looks good.  Overall progressing well.  I can find no evidence of significant wound infection requiring additional treatment but we will continue to observe prior to sending him back to his rehabilitation unit.

## 2021-05-05 NOTE — TOC Initial Note (Signed)
Transition of Care [facility]) - Initial/Assessment Note    Patient Details  Name: Julian Allen MRN: [id number] Date of Birth: Sep 29, 1980  Transition of Care [facility]) CM/SW Contact:    Kermit Balo, RN Phone Number: 05/05/2021, 3:04 PM  Clinical Narrative:                 Patient admitted from [facility] IR. Plan is for him to return at discharge. MD says d/c planned for Monday. CM has left voicemail for [facility] IR admissions.  TOC following.  Expected Discharge Plan: IP Rehab Facility Barriers to Discharge: Continued Medical Work up   Patient Goals and CMS Choice   CMS Medicare.gov Compare Post Acute Care list provided to:: Patient Choice offered to / list presented to : Patient  Expected Discharge Plan and Services Expected Discharge Plan: IP Rehab Facility   Discharge Planning Services: CM Consult Post Acute Care Choice: IP Rehab Living arrangements for the past 2 months:  ([facility] IR)                                      Prior Living Arrangements/Services Living arrangements for the past 2 months:  ([facility] IR)   Patient language and need for interpreter reviewed:: Yes Do you feel safe going back to the place where you live?: Yes      Need for Family Participation in Patient Care: Yes (Comment) Care giver support system in place?: Yes (comment)   Criminal Activity/Legal Involvement Pertinent to Current Situation/Hospitalization: No - Comment as needed  Activities of Daily Living Home Assistive Devices/Equipment: Other (Comment) ADL Screening (condition at time of admission) Patient's cognitive ability adequate to safely complete daily activities?: Yes Is the patient deaf or have difficulty hearing?: No Does the patient have difficulty seeing, even when wearing glasses/contacts?: No Does the patient have difficulty concentrating, remembering, or making decisions?: No Patient able to express need for assistance with ADLs?: Yes Does the patient have difficulty dressing or  bathing?: Yes Independently performs ADLs?: No Communication: Independent Is this a change from baseline?: Pre-admission baseline Dressing (OT): Needs assistance Is this a change from baseline?: Pre-admission baseline Grooming: Needs assistance Is this a change from baseline?: Pre-admission baseline Feeding: Needs assistance Is this a change from baseline?: Pre-admission baseline Bathing: Needs assistance Is this a change from baseline?: Pre-admission baseline Toileting: Needs assistance Is this a change from baseline?: Pre-admission baseline In/Out Bed: Needs assistance Is this a change from baseline?: Pre-admission baseline Walks in Home: Dependent Is this a change from baseline?: Pre-admission baseline Does the patient have difficulty walking or climbing stairs?: Yes Weakness of Legs: Both Weakness of Arms/Hands: Both  Permission Sought/Granted                  Emotional Assessment Appearance:: Appears stated age Attitude/Demeanor/Rapport: Engaged Affect (typically observed): Accepting Orientation: : Oriented to Self, Oriented to Place, Oriented to  Time, Oriented to Situation   Psych Involvement: No (comment)  Admission diagnosis:  Postoperative wound infection [T81.49XA] Patient Active Problem List   Diagnosis Date Noted   Protein-calorie malnutrition, severe 05/05/2021   Postoperative wound infection 05/04/2021   Pressure injury of skin 05/04/2021   Fall, initial encounter 04/11/2021   PCP:  Default, Provider, MD Pharmacy:   [phone] [address], [place] - [address] [place] [address] Phone: [phone] Fax: [phone]  [id number] - [place], [place] - [address] [place] [address] Phone: [phone] Fax: [phone]     Social Determinants of Health (SDOH) Interventions    Readmission Risk Interventions No flowsheet data found.

## 2021-05-05 NOTE — Evaluation (Signed)
Occupational Therapy Evaluation Patient Details Name: Julian Allen MRN: [id number] DOB: 04/13/1981 Today's Date: 05/05/2021   History of Present Illness Pt is a 40 y/o male who presents from inpatient rehab for assessment of cervical wound due to drainage and concern for infection. Pt initially admitted 10/2 s/p falling through ceiling landing on exercise equipment. 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 Pt underwent posterior cervical fusion C2-4 and L1-2 Decompression laminectomy with reduction of fx T12 L1 L2 L3 PLA with pedicle screw fixation on 10/2. PMH seizure, crohns disease   Clinical Impression   Amaan was evaluated s/p the above re-admission. He reports he is making great progress at CIR: set up for meals and grooming, dependent transfers but learning to use PWC, all sensation is WLF, BUE and LLE gaining movement and strength. Evaluation limited to not having CTO, however dad present at the end of the session - brace now in room. Pt was mod-max A +2 for bed mobility, and max A for sitting balance. Pt greatly limited by fear of falling with increased anxiety with sitting EOB despite support of +2. Set up at bed level with oral hygiene. Pt wil benefit from continued OT. Recommend d/c back to CIR when appropriate.      Recommendations for follow up therapy are one component of a multi-disciplinary discharge planning process, led by the attending physician.  Recommendations may be updated based on patient status, additional functional criteria and insurance authorization.   Follow Up Recommendations  Acute inpatient rehab (3hours/day) (back to CIR at [facility])    Assistance Recommended at Discharge Frequent or constant Supervision/Assistance  Functional Status Assessment  Patient has had a recent decline in their functional status and/or demonstrates limited ability to make significant improvements in function in a  reasonable and predictable amount of time  Equipment Recommendations  Other (comment) (defer to CIR at [facility])    Recommendations for Other Services Rehab consult     Precautions / Restrictions Precautions Precautions: Fall;Cervical;Back Precaution Booklet Issued: No Required Braces or Orthoses: Other Brace Other Brace: CTO, can be donned and doffed in sitting; cervical collar on while in bed (per chat text with neuro NP) Restrictions Weight Bearing Restrictions: No      Mobility Bed Mobility Overal bed mobility: Needs Assistance Bed Mobility: Rolling;Sidelying to Sit;Sit to Sidelying Rolling: Mod assist;+2 for physical assistance Sidelying to sit: Max assist;+2 for physical assistance;HOB elevated     Sit to sidelying: Max assist;+2 for physical assistance General bed mobility comments: +2 assist for log roll to/from EOB. Pt with increased anxiety -fearful of falling.    Transfers                   General transfer comment: Deferred as CTO not present during session. Pt's father brought brace in at end of session.      Balance Overall balance assessment: Needs assistance Sitting-balance support: Bilateral upper extremity supported;Feet supported Sitting balance-Leahy Scale: Zero Sitting balance - Comments: Up to max assist for sitting balance at EOB. Anxiety and fear of falling driving some posterior lean. Is able to anteriorly lean and contract core with LAQ Postural control: Posterior lean;Left lateral lean                                 ADL either performed or assessed with clinical judgement   ADL Overall ADL's : Needs assistance/impaired  Eating/Feeding: Set up Eating/Feeding Details (indicate cue type and reason): wtih built up foam on utensils Grooming: Oral care;Set up Grooming Details (indicate cue type and reason): wtih builtup foam on toothbrush Upper Body Bathing: Moderate assistance;Bed level   Lower Body Bathing: Maximal  assistance;Bed level   Upper Body Dressing : Moderate assistance;Sitting   Lower Body Dressing: Total assistance;Bed level   Toilet Transfer: Total assistance Toilet Transfer Details (indicate cue type and reason): incontinent bowel/bladder Toileting- Clothing Manipulation and Hygiene: Total assistance;Bed level       Functional mobility during ADLs: Maximal assistance;Total assistance;+2 for physical assistance;+2 for safety/equipment General ADL Comments: pt extremely fearful of falling adn anxiuos while sitting EOB despite +2 assist.      Pertinent Vitals/Pain Pain Assessment: 0-10 Pain Score: 5  Pain Location: back, R LE (ankle and knee) Pain Intervention(s): Monitored during session     Hand Dominance Right   Extremity/Trunk Assessment Upper Extremity Assessment Upper Extremity Assessment: LUE deficits/detail;RUE deficits/detail RUE Deficits / Details: abnormal movement pattern to complete ADLs with BLE, thumb to finger coordination is slow in deliberate. wrist MMT flexion>extension, grip strength 3-/5, sensation is WFL RUE Sensation: WNL RUE Coordination: decreased fine motor;decreased gross motor LUE Deficits / Details: abnormal movement pattern to complete ADLs with BLE, thumb to finger coordination is slow in deliberate. wrist MMT flexion>extension, grip strength 3-/5, sensation is WFL LUE Sensation: WNL LUE Coordination: decreased fine motor;decreased gross motor   Lower Extremity Assessment Lower Extremity Assessment: Defer to PT evaluation RLE Deficits / Details: Sensation appears to be intact and WNL. Unable to DF ankle. Pt able to activate hamstrings for partial heel slide in supine, 2/5 strength in hamstrings, quads. RLE Sensation: WNL RLE Coordination: decreased gross motor;decreased fine motor LLE Deficits / Details: Sensation appears to be intact and WNL. 3 to 4-/5 strength in quads/hip flexors, hamstrings. Able to DF ankle fully. LLE Sensation: WNL LLE  Coordination: decreased gross motor   Cervical / Trunk Assessment Cervical / Trunk Assessment: Back Surgery;Neck Surgery;Kyphotic   Communication Communication Communication: No difficulties   Cognition Arousal/Alertness: Awake/alert Behavior During Therapy: WFL for tasks assessed/performed;Anxious Overall Cognitive Status: Within Functional Limits for tasks assessed               General Comments  VSS on RA - father present at the end of the session            Home Living Family/patient expects to be discharged to:: Inpatient rehab              Prior Functioning/Environment Prior Level of Function : Independent/Modified Independent             Mobility Comments: Was independent until fall through ceiling 10/2. Since being at inpatient rehab, he has been been transferring to/from a power chair with the lift and able to self steer ADLs Comments: Staff assisting with washing up but set up for brushing teeth. Has not attempted a shower transfer yet pet pt report.        OT Problem List: Decreased strength;Decreased range of motion;Decreased activity tolerance;Impaired balance (sitting and/or standing);Decreased coordination;Decreased cognition;Decreased safety awareness;Decreased knowledge of use of DME or AE;Decreased knowledge of precautions;Impaired sensation;Impaired UE functional use      OT Treatment/Interventions: Self-care/ADL training;Therapeutic exercise;Therapeutic activities;Patient/family education;Balance training    OT Goals(Current goals can be found in the care plan section) Acute Rehab OT Goals Patient Stated Goal: more independence OT Goal Formulation: With patient Time For Goal Achievement: 05/04/21 Potential to Achieve Goals:  Good ADL Goals Pt Will Perform Eating: with modified independence Pt Will Perform Grooming: with modified independence Pt Will Perform Upper Body Bathing: with set-up;bed level Additional ADL Goal #1: Will maintain  sitting balance for at least 2 minutes with min guard assist  OT Frequency: Min 2X/week       Co-evaluation PT/OT/SLP Co-Evaluation/Treatment: Yes Reason for Co-Treatment: Complexity of the patient's impairments (multi-system involvement) PT goals addressed during session: Mobility/safety with mobility;Balance;Strengthening/ROM OT goals addressed during session: ADL's and self-care;Strengthening/ROM      AM-PAC OT "6 Clicks" Daily Activity     Outcome Measure Help from another person eating meals?: A Little Help from another person taking care of personal grooming?: A Lot Help from another person toileting, which includes using toliet, bedpan, or urinal?: Total Help from another person bathing (including washing, rinsing, drying)?: A Lot Help from another person to put on and taking off regular upper body clothing?: A Lot Help from another person to put on and taking off regular lower body clothing?: Total 6 Click Score: 11   End of Session    Activity Tolerance:   Patient left:    OT Visit Diagnosis: Other abnormalities of gait and mobility (R26.89);Muscle weakness (generalized) (M62.81);Pain Pain - Right/Left: Right Pain - part of body: Shoulder                Time: 8088-1103 OT Time Calculation (min): 46 min Charges:  OT General Charges $OT Visit: 1 Visit OT Evaluation $OT Eval Moderate Complexity: 1 Mod OT Treatments $Self Care/Home Management : 8-22 mins  Jovanka Westgate A Bartley Vuolo 05/05/2021, 2:11 PM

## 2021-05-05 NOTE — Evaluation (Addendum)
Physical Therapy Evaluation Patient Details Name: Julian Allen MRN: [id number] DOB: 15-Aug-1980 Today's Date: 05/05/2021  History of Present Illness  Pt is a 40 y/o male who presents from inpatient rehab for assessment of cervical wound due to drainage and concern for infection. Pt initially admitted 10/2 s/p falling through ceiling landing on exercise equipment. 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 Pt underwent posterior cervical fusion C2-4 and L1-2 Decompression laminectomy with reduction of fx T12-L3 PLA with pedicle screw fixation on 10/2. PMH seizure, crohns disease   Clinical Impression  Pt admitted with above diagnosis. At the time of PT eval, pt was able to demonstrate bed mobility with +2 assist. Pt reports he was getting OOB to chair with the lift at rehab. Pt is motivated to participate however anxiety and fear of falling appear to be limiting at times. CTO not present until end of session when pt's father arrived so mobility limited to EOB. He was able to tolerate ~10 minutes of sitting activity, wash face, get back wiped off. He then returned to supine with HOB elevated to brush teeth. Anticipate return to CIR when medically appropriate. Pt currently with functional limitations due to the deficits listed below (see PT Problem List). Pt will benefit from skilled PT to increase their independence and safety with mobility to allow discharge to the venue listed below.         Recommendations for follow up therapy are one component of a multi-disciplinary discharge planning process, led by the attending physician.  Recommendations may be updated based on patient status, additional functional criteria and insurance authorization.  Follow Up Recommendations Acute inpatient rehab (3hours/day)    Assistance Recommended at Discharge Frequent or constant Supervision/Assistance  Functional Status Assessment Patient has  had a recent decline in their functional status and demonstrates the ability to make significant improvements in function in a reasonable and predictable amount of time.  Equipment Recommendations   (TBD by next venue of care)    Recommendations for Other Services       Precautions / Restrictions Precautions Precautions: Fall;Cervical;Back Precaution Booklet Issued: No Required Braces or Orthoses: Other Brace Other Brace: CTO, can be donned and doffed in sitting; cervical collar on while in bed (per chat text with neuro NP) Restrictions Weight Bearing Restrictions: No      Mobility  Bed Mobility Overal bed mobility: Needs Assistance Bed Mobility: Rolling;Sidelying to Sit;Sit to Sidelying Rolling: Mod assist;+2 for physical assistance Sidelying to sit: Max assist;+2 for physical assistance;HOB elevated     Sit to sidelying: Max assist;+2 for physical assistance General bed mobility comments: +2 assist for log roll to/from EOB. Pt with increased anxiety -fearful of falling.    Transfers                   General transfer comment: Deferred as CTO not present during session. Pt's father brought brace in at end of session.    Ambulation/Gait                Stairs            Wheelchair Mobility    Modified Rankin (Stroke Patients Only)       Balance Overall balance assessment: Needs assistance Sitting-balance support: Bilateral upper extremity supported;Feet supported Sitting balance-Leahy Scale: Zero Sitting balance - Comments: Up to max assist for sitting balance at EOB. Anxiety and fear of falling driving some posterior lean. Is able to anteriorly  lean and contract core with LAQ Postural control: Posterior lean;Left lateral lean                                   Pertinent Vitals/Pain Pain Assessment: 0-10 Pain Score: 5  Pain Location: back, R LE (ankle and knee) Pain Intervention(s): Monitored during session;Repositioned;Limited  activity within patient's tolerance    Home Living Family/patient expects to be discharged to:: Inpatient rehab                        Prior Function Prior Level of Function : Independent/Modified Independent             Mobility Comments: Was independent until fall through ceiling 10/2. Since being at inpatient rehab, he has been been transferring to/from a power chair with the lift and able to self steer ADLs Comments: Staff assisting with washing up but set up for brushing teeth. Has not attempted a shower transfer yet pet pt report.     Hand Dominance   Dominant Hand: Right    Extremity/Trunk Assessment   Upper Extremity Assessment Upper Extremity Assessment: Defer to OT evaluation    Lower Extremity Assessment Lower Extremity Assessment: RLE deficits/detail;LLE deficits/detail RLE Deficits / Details: Sensation appears to be intact and WNL. Unable to DF ankle. Pt able to activate hamstrings for partial heel slide in supine, 2/5 strength in hamstrings, quads. RLE Sensation: WNL RLE Coordination: decreased gross motor;decreased fine motor LLE Deficits / Details: Sensation appears to be intact and WNL. 3 to 4-/5 strength in quads/hip flexors, hamstrings. Able to DF ankle fully. LLE Sensation: WNL LLE Coordination: decreased gross motor    Cervical / Trunk Assessment Cervical / Trunk Assessment: Back Surgery;Neck Surgery;Kyphotic  Communication   Communication: No difficulties  Cognition Arousal/Alertness: Awake/alert Behavior During Therapy: WFL for tasks assessed/performed;Anxious Overall Cognitive Status: Within Functional Limits for tasks assessed                                          General Comments      Exercises     Assessment/Plan    PT Assessment Patient needs continued PT services  PT Problem List Decreased strength;Decreased range of motion;Decreased activity tolerance;Decreased balance;Decreased mobility;Decreased  knowledge of use of DME;Decreased safety awareness;Decreased knowledge of precautions;Pain       PT Treatment Interventions DME instruction;Gait training;Functional mobility training;Therapeutic activities;Therapeutic exercise;Balance training;Neuromuscular re-education;Patient/family education;Wheelchair mobility training    PT Goals (Current goals can be found in the Care Plan section)  Acute Rehab PT Goals Patient Stated Goal: Get home to his cats - keep doing well at inpatient rehab PT Goal Formulation: With patient Time For Goal Achievement: 05/19/21 Potential to Achieve Goals: Good    Frequency Min 2X/week   Barriers to discharge        Co-evaluation PT/OT/SLP Co-Evaluation/Treatment: Yes Reason for Co-Treatment: Complexity of the patient's impairments (multi-system involvement);For patient/therapist safety;To address functional/ADL transfers PT goals addressed during session: Mobility/safety with mobility;Balance;Strengthening/ROM         AM-PAC PT "6 Clicks" Mobility  Outcome Measure Help needed turning from your back to your side while in a flat bed without using bedrails?: A Lot Help needed moving from lying on your back to sitting on the side of a flat bed without using bedrails?: Total Help needed  moving to and from a bed to a chair (including a wheelchair)?: Total Help needed standing up from a chair using your arms (e.g., wheelchair or bedside chair)?: Total Help needed to walk in hospital room?: Total Help needed climbing 3-5 steps with a railing? : Total 6 Click Score: 7    End of Session Equipment Utilized During Treatment: Cervical collar Activity Tolerance: Patient tolerated treatment well Patient left: in bed;with call bell/phone within reach;with bed alarm set;with family/visitor present Nurse Communication: Mobility status PT Visit Diagnosis: Other symptoms and signs involving the nervous system (R29.898) Pain - Right/Left: Right Pain - part of body:  Leg (and back)    Time: 7124-5809 PT Time Calculation (min) (ACUTE ONLY): 46 min   Charges:   PT Evaluation $PT Eval Moderate Complexity: 1 Mod PT Treatments $Therapeutic Activity: 8-22 mins        Conni Slipper, PT, DPT Acute Rehabilitation Services Pager: [phone] Office: [phone]   Marylynn Pearson 05/05/2021, 1:23 PM

## 2021-05-06 NOTE — TOC Progression Note (Signed)
Transition of Care [facility]) - Progression Note    Patient Details  Name: Julian Allen MRN: [id number] Date of Birth: 1980-10-10  Transition of Care [facility] CM/SW Contact  Kermit Balo, RN Phone Number: 05/06/2021, 1:57 PM  Clinical Narrative:    CM has called and left another voicemail for [facility] IR.  TOC following.   Expected Discharge Plan: IP Rehab Facility Barriers to Discharge: Continued Medical Work up  Expected Discharge Plan and Services Expected Discharge Plan: IP Rehab Facility   Discharge Planning Services: CM Consult Post Acute Care Choice: IP Rehab Living arrangements for the past 2 months:  ([facility] IR)                                       Social Determinants of Health (SDOH) Interventions    Readmission Risk Interventions No flowsheet data found.

## 2021-05-06 NOTE — Progress Notes (Signed)
No new issues or problems overnight.  Patient denies any pain.  He is awake and alert.  He is oriented and appropriate.  He remains afebrile.  His motor and sensory exam are stable.  His cervical wound is completely healed without any drainage for more than 48 hours.  His thoracic or lumbar wound has some superficial dehiscence without evidence of significant drainage.  There is no surrounding erythema.  There is no fluctuance.  There is nothing to suggest deeper infection.  The patient was transferred back for evaluation of possible cervical wound infection.  I currently see no evidence of cervical wound infection.  He has broken down the superficial aspect of his thoracic and lumbar wound but this should heal with local wound care.  We will continue with our inpatient therapies.  I will continue to observe him through the weekend.  If everything is stable plan on discharge back to [facility] Monday.

## 2021-05-06 NOTE — Progress Notes (Signed)
Pt aware that he needs to have his home supply of his adalimumab injection brought in for his weekly Friday injection per [facility] in pharmacy.

## 2021-05-06 NOTE — Plan of Care (Signed)
  Problem: Education: Goal: Knowledge of General Education information will improve Description: Including pain rating scale, medication(s)/side effects and non-pharmacologic comfort measures Outcome: Progressing   Problem: Health Behavior/Discharge Planning: Goal: Ability to manage health-related needs will improve Outcome: Progressing   Problem: Nutrition: Goal: Adequate nutrition will be maintained Outcome: Progressing   Problem: Elimination: Goal: Will not experience complications related to bowel motility Outcome: Progressing Goal: Will not experience complications related to urinary retention Outcome: Progressing   Problem: Pain Managment: Goal: General experience of comfort will improve Outcome: Progressing   Problem: Safety: Goal: Ability to remain free from injury will improve Outcome: Progressing   Problem: Skin Integrity: Goal: Risk for impaired skin integrity will decrease Outcome: Progressing   

## 2021-05-07 NOTE — Progress Notes (Signed)
No new issues or problems.  Patient resting comfortably.  Denies any pain.  Continues to work with therapies.  Awake and alert.  Oriented and appropriate.  He is afebrile.  His vitals are stable.  Urine output is good.  Motor examination stable.  4+/5 strength in both upper extremities.  4/5 strength in his right lower extremity 4-/5 strength in his left lower extremity.  Posterior cervical wound is well-healed.  There is no drainage.  There is no erythema.  There is no fluctuance.  There is nothing here to suggest wound infection.  Lumbar wound with some superficial dehiscence and wound separation.  There is no evidence of drainage.  The granulation bed appears healthy.  There is nothing to suggest significant infection.  Patient progressing well following cervical and lumbar injuries.  The patient continues to mobilize with therapy.  The patient was sent back for evaluation of possible wound infection.  I definitely find no evidence of any wound infection with regard to his cervical spine.  His lumbar spine is free from infection although there is some superficial wound dehiscence which will be managed with local wound care.  Plan is for the patient to be transferred back to inpatient rehab at [facility].

## 2021-05-07 NOTE — TOC Progression Note (Signed)
Transition of Care [facility]) - Progression Note    Patient Details  Name: Julian Allen MRN: [id number] Date of Birth: September 13, 1980  Transition of Care [facility]) CM/SW Contact  Kermit Balo, RN Phone Number: 05/07/2021, 11:14 AM  Clinical Narrative:    CM was finally able to talk with [facility] IR: [facility]. CM has faxed [facility] the requested information so they can get auth for admission next week. On[facility]e Hua states they won't have a bed until Tuesday or Wednesday next week. TOC following. Fax: [phone] Onalee Hua' s cell: [phone] [facility] IR: [phone]   Expected Discharge Plan: IP Rehab Facility Barriers to Discharge: Continued Medical Work up  Expected Discharge Plan and Services Expected Discharge Plan: IP Rehab Facility   Discharge Planning Services: CM Consult Post Acute Care Choice: IP Rehab Living arrangements for the past 2 months:  ([facility] IR)                                       Social Determinants of Health (SDOH) Interventions    Readmission Risk Interventions No flowsheet data found.

## 2021-05-08 NOTE — Progress Notes (Signed)
NEUROSURGERY PROGRESS NOTE  Doing well. Incision CDI. Continue therapies today. Plan is to be transferred back to [facility] rehab next week.  Temp:  [97.9 F (36.6 C)-98.6 F (37 C)] 98 F (36.7 C) (10/29 0356) Pulse Rate:  [76-85] 76 (10/29 0356) Resp:  [17-20] 17 (10/29 0356) BP: (104-113)/(62-73) 104/62 (10/29 0356) SpO2:  [97 %-99 %] 99 % (10/29 0356)   Sherryl Manges, NP 05/08/2021 8:25 AM

## 2021-05-08 NOTE — Progress Notes (Signed)
Occupational Therapy Treatment Patient Details Name: Julian Allen MRN: [id number] DOB: [birth year] Today's Date: 05/08/2021   History of present illness Pt is a 40 y/o male who presents from inpatient rehab for assessment of cervical wound due to drainage and concern for infection. Pt initially admitted 10/2 s/p falling through ceiling landing on exercise equipment. 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 Pt underwent posterior cervical fusion C2-4 and L1-2 Decompression laminectomy with reduction of fx T12 L1 L2 L3 PLA with pedicle screw fixation on 10/2. PMH seizure, crohns disease   OT comments  Patient received in bed and agreeable to OT treatment. Patient was assisted in repositioning in bed due to low in bed and uncomfortable. Patient performed grooming tasks sitting up in bed with assistance to open toothpaste and setup. Performed BUE hand dexterity exercises and ball squeezes. Acute OT to continue to follow.    Recommendations for follow up therapy are one component of a multi-disciplinary discharge planning process, led by the attending physician.  Recommendations may be updated based on patient status, additional functional criteria and insurance authorization.    Follow Up Recommendations  Acute inpatient rehab (3hours/day)    Assistance Recommended at Discharge Frequent or constant Supervision/Assistance  Equipment Recommendations  Other (comment)    Recommendations for Other Services Rehab consult    Precautions / Restrictions Precautions Precautions: Fall;Cervical;Back Precaution Booklet Issued: No Precaution Comments: watch BP, foley Required Braces or Orthoses: Other Brace Other Brace: CTO, can be donned and doffed in sitting; cervical collar on while in bed (per chat text with neuro NP) Restrictions Weight Bearing Restrictions: No       Mobility Bed Mobility Overal bed mobility: Needs  Assistance Bed Mobility: Rolling Rolling: Max assist         General bed mobility comments: adjusted positioning in bed    Transfers                         Balance                                           ADL either performed or assessed with clinical judgement   ADL Overall ADL's : Needs assistance/impaired     Grooming: Wash/dry hands;Wash/dry face;Oral care;Minimal assistance;Bed level Grooming Details (indicate cue type and reason): builtup foam on toothbrush, assistance needed to open toothpaste                                     Vision       Perception     Praxis      Cognition Arousal/Alertness: Awake/alert Behavior During Therapy: WFL for tasks assessed/performed;Anxious Overall Cognitive Status: Within Functional Limits for tasks assessed                                 General Comments: motivated to participate with OT.          Exercises     Shoulder Instructions       General Comments      Pertinent Vitals/ Pain       Pain Assessment: Faces Faces Pain Scale: Hurts little more Breathing: normal Negative Vocalization: none Facial  Expression: smiling or inexpressive Body Language: relaxed Consolability: no need to console PAINAD Score: 0 Facial Expression: Relaxed, neutral Body Movements: Absence of movements Muscle Tension: Relaxed Pain Location: back, R LE (ankle and knee) Pain Descriptors / Indicators: Discomfort;Grimacing Pain Intervention(s): Repositioned  Home Living                                          Prior Functioning/Environment              Frequency  Min 2X/week        Progress Toward Goals  OT Goals(current goals can now be found in the care plan section)  Progress towards OT goals: Progressing toward goals  Acute Rehab OT Goals Patient Stated Goal: get back to rehab OT Goal Formulation: With patient Time For Goal  Achievement: 05/04/21 Potential to Achieve Goals: Good ADL Goals Pt Will Perform Eating: with modified independence Pt Will Perform Grooming: with modified independence Pt Will Perform Upper Body Bathing: with set-up;bed level Pt Will Transfer to Toilet: with transfer board;bedside commode;with min assist Pt/caregiver will Perform Home Exercise Program: Increased ROM;Increased strength;Both right and left upper extremity;Independently;With written HEP provided Additional ADL Goal #1: Will maintain sitting balance for at least 2 minutes with min guard assist Additional ADL Goal #2: Pt will demonstrate shoulder retraction x10 reps min (A) Additional ADL Goal #3: Pt will be able to sit EOB working on sitting balance with no more than min A for balance.  Plan Discharge plan remains appropriate    Co-evaluation                 AM-PAC OT "6 Clicks" Daily Activity     Outcome Measure   Help from another person eating meals?: A Little Help from another person taking care of personal grooming?: A Lot Help from another person toileting, which includes using toliet, bedpan, or urinal?: Total Help from another person bathing (including washing, rinsing, drying)?: A Lot Help from another person to put on and taking off regular upper body clothing?: A Lot Help from another person to put on and taking off regular lower body clothing?: Total 6 Click Score: 11    End of Session Equipment Utilized During Treatment: Cervical collar  OT Visit Diagnosis: Other abnormalities of gait and mobility (R26.89);Muscle weakness (generalized) (M62.81);Pain Pain - Right/Left: Right Pain - part of body: Shoulder   Activity Tolerance Patient tolerated treatment well   Patient Left in bed;with call bell/phone within reach;with bed alarm set;with nursing/sitter in room   Nurse Communication Other (comment) (on bed positioning)        Time: 8206-0156 OT Time Calculation (min): 28 min  Charges: OT  General Charges $OT Visit: 1 Visit OT Treatments $Self Care/Home Management : 8-22 mins $Therapeutic Activity: 8-22 mins  Julian Allen, OTA Acute Rehabilitation Services  Pager [phone] Office [phone]   Julian Allen 05/08/2021, 9:08 AM

## 2021-05-09 MED ORDER — METHOCARBAMOL 500 MG PO TABS
500.0000 mg | ORAL_TABLET | Freq: Four times a day (QID) | ORAL | Status: DC | PRN
  Administered 2021-05-09 – 2021-05-10 (×2): 500 mg via ORAL
  Filled 2021-05-09 (×2): qty 1

## 2021-05-09 MED ORDER — METHOCARBAMOL 1000 MG/10ML IJ SOLN
500.0000 mg | Freq: Four times a day (QID) | INTRAVENOUS | Status: DC | PRN
  Filled 2021-05-09: qty 5

## 2021-05-09 NOTE — Progress Notes (Signed)
He is doing fairly well.  Denies significant pain.  Remains in his collar.  Awaiting placement.

## 2021-05-10 LAB — RESP PANEL BY RT-PCR (FLU A&B, COVID) ARPGX2
Influenza A by PCR: NEGATIVE
Influenza B by PCR: NEGATIVE
SARS Coronavirus 2 by RT PCR: NEGATIVE

## 2021-05-10 MED ORDER — DOXYCYCLINE HYCLATE 100 MG PO TABS
100.0000 mg | ORAL_TABLET | Freq: Two times a day (BID) | ORAL | Status: DC
  Administered 2021-05-10 – 2021-05-11 (×3): 100 mg via ORAL
  Filled 2021-05-10 (×3): qty 1

## 2021-05-10 NOTE — Progress Notes (Signed)
Nutrition Follow-up  DOCUMENTATION CODES:   Severe malnutrition in context of acute illness/injury  INTERVENTION:  -continue MVI with minerals daily -continue Ensure Enlive po BID, each supplement provides 350 kcal and 20 grams of protein -continue to encourage good po intake  NUTRITION DIAGNOSIS:   Severe Malnutrition related to acute illness as evidenced by percent weight loss, severe muscle depletion, severe fat depletion.  ongoing  GOAL:   Patient will meet greater than or equal to 90% of their needs  progressing  MONITOR:   PO intake, Supplement acceptance, Weight trends, Skin  REASON FOR ASSESSMENT:   Malnutrition Screening Tool    ASSESSMENT:   40 y.o. male transferred from [facility] for posterior cervical wound drainage. Pt recently admitted to [facility] after fall resulting in cervical and lumbar fractures; pt discharged to rehab on 10/18. PMH includes Crohn's disease.  Per MD, pt doing well though has some neuropathic pain in RLE. Pt states pain is otherwise well controlled. MD notes pt ready for transfer back to rehab in [place].   Pt endorses good appetite and good supplement consumption. Continue current nutrition plan of care.   PO Intake: 75-100 x last 7 recorded meals (96% average intake)   UOP: x24 hours I/O: - since admit  Medications: colace, abx, Ensure Enlive/Plus BID, nutrisource fiber TID, keppra, mvi with minerals Labs reviewed.    Diet Order:   Diet Order             Diet regular Room service appropriate? Yes; Fluid consistency: Thin  Diet effective now                   EDUCATION NEEDS:   No education needs have been identified at this time  Skin:  Skin Integrity Issues:: Incisions, Stage II Stage II: Coccyx & R Ankle Incisions: Neck & Back  Last BM:  10/30  Height:   Ht Readings from Last 1 Encounters:  05/04/21 5\' 8"  (1.727 m)    Weight:   Wt Readings from Last 1 Encounters:  05/04/21 63.5 kg    Ideal  Body Weight:  70 kg  BMI:  Body mass index is 21.29 kg/m.  Estimated Nutritional Needs:   Kcal:  2200-2400  Protein:  110-125 grams  Fluid:  >/= 2 L    05/06/21, MS, RD, LDN (she/her/hers) RD pager number and weekend/on-call pager number located in Amion.

## 2021-05-10 NOTE — Progress Notes (Signed)
Physical Therapy Treatment Patient Details Name: Julian Allen MRN: [id number] DOB: 1981-05-01 Today's Date: 05/10/2021   History of Present Illness Pt is a 40 y/o male who presents from inpatient rehab for assessment of cervical wound due to drainage and concern for infection. Pt initially admitted 10/2 s/p falling through ceiling landing on exercise equipment. 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 Pt underwent posterior cervical fusion C2-4 and L1-2 Decompression laminectomy with reduction of fx T12 L1 L2 L3 PLA with pedicle screw fixation on 10/2. PMH seizure, crohns disease    PT Comments    Patient progressing slowly with pain limiting tolerance to transitional movements.  Seated EOB this session working on lateral leans and recovery pushing up with UE's and pt directing his care with positioning, brace application, etc.  Feel he will continue to benefit from skilled PT in the acute setting and pt hopeful to return to inpatient rehab tomorrow.    Recommendations for follow up therapy are one component of a multi-disciplinary discharge planning process, led by the attending physician.  Recommendations may be updated based on patient status, additional functional criteria and insurance authorization.  Follow Up Recommendations  Acute inpatient rehab (3hours/day)     Assistance Recommended at Discharge Frequent or constant Supervision/Assistance  Equipment Recommendations  None recommended by PT    Recommendations for Other Services       Precautions / Restrictions Precautions Precautions: Fall;Cervical;Back Precaution Comments: watch BP, foley Required Braces or Orthoses: Other Brace Other Brace: CTO, can be donned and doffed in sitting; cervical collar on while in bed (per chat text with neuro NP)     Mobility  Bed Mobility Overal bed mobility: Needs Assistance Bed Mobility: Rolling Rolling: Mod  assist;Max assist Sidelying to sit: Max assist;+2 for physical assistance     Sit to sidelying: Max assist;+2 for physical assistance General bed mobility comments: rolling to R using L LE and reaching with L UE for rail to don CTO; side to sit patient with severe pain so limited assist even with rail; back to sidelying pt assisting some using R UE to lean down, then needed help to get arm out from under him and for LE's.  Rolled back with A and positioned for comfort with pillows    Transfers                   General transfer comment: deferred per pt due to pain    Ambulation/Gait                 Stairs             Wheelchair Mobility    Modified Rankin (Stroke Patients Only)       Balance Overall balance assessment: Needs assistance Sitting-balance support: Feet unsupported Sitting balance-Leahy Scale: Zero Sitting balance - Comments: assist provided while sitting due to significant pain and limited tolerance despite scheduled meds per pt.  performed lateral leans onto elbow and pt using hand to push back up with about 70% help.                                    Cognition Arousal/Alertness: Awake/alert Behavior During Therapy: WFL for tasks assessed/performed;Anxious Overall Cognitive Status: Within Functional Limits for tasks assessed  Exercises Other Exercises Other Exercises: kicking legs while sitting EOB, but c/o pain on R so limited Other Exercises: lifting arms in sitting    General Comments General comments (skin integrity, edema, etc.): Patient encouraged to continue verbally directing his care with where to put on CTO, with how to position pillows for comfort and offloading heels, etc      Pertinent Vitals/Pain Faces Pain Scale: Hurts whole lot Pain Location: R hip and back esp with transitional movement Pain Descriptors / Indicators: Other  (Comment);Grimacing;Guarding;Moaning (excruciating) Pain Intervention(s): Monitored during session;Repositioned;Limited activity within patient's tolerance    Home Living                          Prior Function            PT Goals (current goals can now be found in the care plan section) Progress towards PT goals: Progressing toward goals    Frequency    Min 2X/week      PT Plan Current plan remains appropriate    Co-evaluation              AM-PAC PT "6 Clicks" Mobility   Outcome Measure  Help needed turning from your back to your side while in a flat bed without using bedrails?: A Lot Help needed moving from lying on your back to sitting on the side of a flat bed without using bedrails?: Total Help needed moving to and from a bed to a chair (including a wheelchair)?: Total Help needed standing up from a chair using your arms (e.g., wheelchair or bedside chair)?: Total Help needed to walk in hospital room?: Total Help needed climbing 3-5 steps with a railing? : Total 6 Click Score: 7    End of Session Equipment Utilized During Treatment: Back brace;Cervical collar (CTO) Activity Tolerance: Patient limited by pain Patient left: in bed;with call bell/phone within reach;with bed alarm set   PT Visit Diagnosis: Other symptoms and signs involving the nervous system (R29.898);History of falling (Z91.81);Muscle weakness (generalized) (M62.81);Other abnormalities of gait and mobility (R26.89);Pain Pain - Right/Left: Right Pain - part of body: Hip     Time: 7616-0737 PT Time Calculation (min) (ACUTE ONLY): 25 min  Charges:  $Therapeutic Activity: 23-37 mins                     Sheran Lawless, PT Acute Rehabilitation Services Pager:[phone] Office:[phone] 05/10/2021    [name] 05/10/2021, 5:30 PM

## 2021-05-10 NOTE — Progress Notes (Signed)
Plan for 1 month of doxy as suppressive therapy per NS. Will put order in.  Ulyses Southward, PharmD, BCIDP, AAHIVP, CPP Infectious Disease Pharmacist 05/10/2021 9:51 AM

## 2021-05-10 NOTE — Progress Notes (Signed)
Overall continues to do well.  Pain well controlled.  No neck pain.  No incisional pain.  Still with some neuropathic pain in his right lower extremity.  He remains afebrile.  He shows no signs of sepsis or infection.  His wound in his cervical region is clean dry and intact without any erythema or fluctuance.  His thoracic lumbar wound has some superficial dehiscence with some separation of the wound but healthy granulation bed without evidence of surrounding erythema or any significant deep wound drainage.  Patient doing well.  Ready for transfer back to rehabilitation at [facility].  Cervical wound requires no further attention.  Lumbar wound will require daily damp to dry dressing changes.  Patient should be maintained on suppression therapy with doxycycline for 1 month.

## 2021-05-10 NOTE — TOC Progression Note (Addendum)
Transition of Care [facility]) - Progression Note    Patient Details  Name: Jessy Cybulski MRN: [id number] Date of Birth: Sep 03, 1980  [facility] of [facility]) CM/SW Contact  Kermit Balo, RN Phone Number: 05/10/2021, 12:21 PM  Clinical Narrative:    CM spoke to Onalee Hua with [facility] IR and they have submitted for insurance. They hope to hear from his insurance carrier today or tomorrow. Onalee Hua hopes to re-admit the patient tomorrow if they have bed available and insurance provides approval.  TOC following.  1310: [facility] IR has received insurance approval. They are asking for 12 pm transport tomorrow. CM has arranged this through [facility]. Covid test ordered per [facility] request.   Expected Discharge Plan: IP Rehab Facility Barriers to Discharge: Continued Medical Work up  Expected Discharge Plan and Services Expected Discharge Plan: IP Rehab Facility   Discharge Planning Services: CM Consult Post Acute Care Choice: IP Rehab Living arrangements for the past 2 months:  ([facility] IR)                                       Social Determinants of Health (SDOH) Interventions    Readmission Risk Interventions No flowsheet data found.

## 2021-05-11 ENCOUNTER — Ambulatory Visit
Admit: 2021-05-11 | Discharge: 2021-05-18 | Disposition: A | Discharge status: Other Acute Inpatient Hospital | Payer: PRIVATE HEALTH INSURANCE | Source: Other Acute Inpatient Hospital | Admitting: Spinal Cord Injury Medicine

## 2021-05-11 MED ORDER — HUMIRA (2 SYRINGE) 40 MG/0.4ML ~~LOC~~ PSKT: 40.0000 mg | PREFILLED_SYRINGE | SUBCUTANEOUS | 1 refills | Status: AC

## 2021-05-11 MED ORDER — ENOXAPARIN SODIUM 40 MG/0.4ML IJ SOSY: 40.0000 mg | PREFILLED_SYRINGE | INTRAMUSCULAR | 0 refills | Status: AC

## 2021-05-11 MED ORDER — DOXYCYCLINE HYCLATE 100 MG PO TABS: 100.0000 mg | ORAL_TABLET | Freq: Two times a day (BID) | ORAL | 0 refills | Status: AC

## 2021-05-11 MED ORDER — ACETAMINOPHEN 325 MG PO TABS: 650.0000 mg | ORAL_TABLET | Freq: Four times a day (QID) | ORAL | 0 refills | Status: AC | PRN

## 2021-05-11 MED ORDER — DOCUSATE SODIUM 100 MG PO CAPS: 100.0000 mg | ORAL_CAPSULE | Freq: Every day | ORAL | 0 refills | Status: AC

## 2021-05-11 MED ORDER — LEVETIRACETAM 500 MG PO TABS: 500.0000 mg | ORAL_TABLET | Freq: Two times a day (BID) | ORAL | 1 refills | Status: AC

## 2021-05-11 MED ORDER — METHOCARBAMOL 500 MG PO TABS: 500.0000 mg | ORAL_TABLET | Freq: Four times a day (QID) | ORAL | 1 refills | Status: AC | PRN

## 2021-05-11 MED ORDER — HYDROCODONE-ACETAMINOPHEN 5-325 MG PO TABS: 1.0000 | ORAL_TABLET | ORAL | 0 refills | Status: AC | PRN

## 2021-05-11 MED ORDER — ADULT MULTIVITAMIN W/MINERALS CH: 1.0000 | ORAL_TABLET | Freq: Every day | ORAL | 1 refills | Status: AC

## 2021-05-11 MED ORDER — ENSURE ENLIVE PO LIQD: 237.0000 mL | Freq: Two times a day (BID) | ORAL | 12 refills | Status: AC

## 2021-05-11 NOTE — Progress Notes (Signed)
No new issues or problems.  Ready for transfer to [facility] rehabilitation today.

## 2021-05-11 NOTE — TOC Transition Note (Signed)
Transition of Care [facility]) - CM/SW Discharge Note   Patient Details  Name: Julian Allen MRN: [id number] Date of Birth: [birth year]  [facility]) CM/SW Contact:  Kermit Balo, RN Phone Number: 05/11/2021, 10:11 AM   Clinical Narrative:    Patient is discharging to [facility] rehab in [place]. CM has faxed summary and medications to the inpatient rehab: [phone]. [facility] to provide transport and has him on the schedule for 12 pm.   Number for report: [phone]   Final next level of care: IP Rehab Facility Barriers to Discharge: No Barriers Identified   Patient Goals and CMS Choice   CMS Medicare.gov Compare Post Acute Care list provided to:: Patient Choice offered to / list presented to : Patient  Discharge Placement                       Discharge Plan and Services   Discharge Planning Services: CM Consult Post Acute Care Choice: IP Rehab                               Social Determinants of Health (SDOH) Interventions     Readmission Risk Interventions No flowsheet data found.

## 2021-05-11 NOTE — Progress Notes (Signed)
OT Cancellation Note  Patient Details Name: Brennon Otterness MRN: [id number] DOB: [birth year]   Cancelled Treatment:    Reason Eval/Treat Not Completed: Patient at procedure or test/ unavailable (Pt with [facility] team uon arrival, being d/c'd to CIR.)  Doil Kamara A Pike Scantlebury 05/11/2021, 1:20 PM

## 2021-05-11 NOTE — Discharge Summary (Signed)
Physician Discharge Summary  Patient ID: Julian Allen MRN: [id number] DOB/AGE: [birth year] 42 y.o.  Admit date: 05/03/2021 Discharge date: 05/11/2021  Admission Diagnoses:  Discharge Diagnoses:  Active Problems:   Postoperative wound infection   Pressure injury of skin   Protein-calorie malnutrition, severe Patient does not have postoperative wound infection.  Patient status post cervical spinal cord injury  Superficial dehiscence lumbar wound  Discharged Condition: good  Hospital Course: Patient was readmitted to the hospital on transfer from outside institution for worries of a possible cervical wound infection.  Patient was admitted to the hospital.  His wound is examined.  The wound was healing well.  There is no evidence of drainage.  Over the course of the patient's week of hospitalization there is been no drainage from the cervical wound.  The skin is well-healed.  There is no evidence of surrounding erythema or any fluctuance.  The patient does have some superficial breakdown of his lumbar wound with some mild dehiscence but no evidence of infection.  He has been afebrile throughout his hospital course.  He has some neuropathic pain relative to his spinal cord injury but overall his pain is well controlled.  He is mobilized with therapy.  Because the patient at one point did have some drainage from his cervical wound I would recommend suppression therapy with doxycycline for a 4-week course total.  He will require 2 additional weeks of doxycycline after discharge from this hospital.  He does not need to continue this long-term.  The patient may be mobilized ad lib. with therapy.  He should continue with his cervical brace.  Then lumbar brace is as needed.  Consults:   Significant Diagnostic Studies:   Treatments:   Discharge Exam: Blood pressure 118/76, pulse 90, temperature 98.3 F (36.8 C), temperature source Oral, resp. rate 15, height 5\' 8"  (1.727 m), weight 63.5 kg, SpO2  100 %. Patient is awake and alert.  He is oriented and appropriate.  Speech is fluent.  Judgment insight are intact.  Cranial nerve function normal by.  Motor examination reveals 4+/5 strength in both upper extremities with some patchy distal sensory loss and some paresthesias.  Lower extremity strength reveals 4/5 strength in his left lower extremity with reasonably good motor control and some increased tone.  He has 4-/5 strength in his right lower extremity.  His cervical wound as mentioned above is completely healed without evidence of infection.  Thoracic/lumbar wound demonstrates some superficial dehiscence with good granulation bed.  Plan is to continue with wet-to-dry dressing changes daily chest and abdomen are benign.  Extremities are free from injury or deformity.  Disposition: Discharge disposition: 02-Transferred to [facility]        Allergies as of 05/11/2021       Reactions   Remicade [infliximab] Other (See Comments)   Seizures        Medication List     STOP taking these medications    ALPRAZolam 0.25 MG tablet Commonly known as: XANAX   azaTHIOprine 50 MG tablet Commonly known as: IMURAN   bethanechol 25 MG tablet Commonly known as: URECHOLINE   busPIRone 15 MG tablet Commonly known as: BUSPAR   docusate 50 MG/5ML liquid Commonly known as: COLACE Replaced by: docusate sodium 100 MG capsule   fiber Pack packet   Humira Pen 40 MG/0.4ML Pnkt Generic drug: Adalimumab   Humira Pen 40 MG/0.8ML Pnkt Generic drug: Adalimumab Replaced by: Humira 40 MG/0.4ML Pskt   loperamide 2 MG capsule Commonly known as:  IMODIUM   oxyCODONE 5 MG immediate release tablet Commonly known as: Oxy IR/ROXICODONE   tamsulosin 0.4 MG Caps capsule Commonly known as: FLOMAX       TAKE these medications    acetaminophen 325 MG tablet Commonly known as: TYLENOL Take 2 tablets (650 mg total) by mouth every 6 (six) hours as needed for mild pain (or Fever >/=  101). What changed:  when to take this reasons to take this   docusate sodium 100 MG capsule Commonly known as: COLACE Take 1 capsule (100 mg total) by mouth daily. Start taking on: May 12, 2021 Replaces: docusate 50 MG/5ML liquid   doxycycline 100 MG tablet Commonly known as: VIBRA-TABS Take 1 tablet (100 mg total) by mouth every 12 (twelve) hours.   enoxaparin 40 MG/0.4ML injection Commonly known as: LOVENOX Inject 0.4 mLs (40 mg total) into the skin daily. What changed:  medication strength how much to take when to take this   feeding supplement Liqd Take 237 mLs by mouth 2 (two) times daily between meals. What changed: when to take this   Humira 40 MG/0.4ML Pskt Generic drug: Adalimumab Inject 40 mg into the skin every Friday. Start taking on: May 14, 2021 Replaces: Humira Pen 40 MG/0.8ML Pnkt   HYDROcodone-acetaminophen 5-325 MG tablet Commonly known as: NORCO/VICODIN Take 1-2 tablets by mouth every 4 (four) hours as needed for moderate pain.   levETIRAcetam 500 MG tablet Commonly known as: KEPPRA Take 1 tablet (500 mg total) by mouth 2 (two) times daily. What changed: Another medication with the same name was removed. Continue taking this medication, and follow the directions you see here.   methocarbamol 500 MG tablet Commonly known as: ROBAXIN Take 1 tablet (500 mg total) by mouth every 6 (six) hours as needed for muscle spasms. What changed:  how much to take when to take this reasons to take this   multivitamin with minerals Tabs tablet Take 1 tablet by mouth daily. Start taking on: May 12, 2021         Signed: Temple Pacini 05/11/2021, 9:36 AM

## 2021-05-11 NOTE — Discharge Instructions (Signed)

## 2021-05-12 ENCOUNTER — Ambulatory Visit
Admit: 2021-05-12 | Discharge: 2021-05-12 | Payer: PRIVATE HEALTH INSURANCE | Source: Other Acute Inpatient Hospital | Admitting: Spinal Cord Injury Medicine

## 2021-05-18 ENCOUNTER — Ambulatory Visit: Admit: 2021-05-18 | Payer: PRIVATE HEALTH INSURANCE

## 2021-05-18 ENCOUNTER — Encounter: Admit: 2021-05-18 | Payer: PRIVATE HEALTH INSURANCE

## 2021-05-18 ENCOUNTER — Encounter: Admit: 2021-05-18 | Payer: PRIVATE HEALTH INSURANCE | Attending: Neurological Surgery

## 2021-05-18 ENCOUNTER — Encounter: Admit: 2021-05-18 | Payer: PRIVATE HEALTH INSURANCE | Attending: Anesthesiology

## 2021-05-18 ENCOUNTER — Ambulatory Visit
Admit: 2021-05-18 | Discharge: 2021-06-01 | Disposition: A | Discharge status: Rehabilitation Facility | Payer: PRIVATE HEALTH INSURANCE | Source: Other Acute Inpatient Hospital

## 2021-05-18 ENCOUNTER — Ambulatory Visit: Admit: 2021-05-18 | Discharge: 2021-06-01 | Payer: PRIVATE HEALTH INSURANCE

## 2021-06-01 ENCOUNTER — Ambulatory Visit
Admission: TF | Admit: 2021-06-01 | Discharge: 2021-06-17 | Disposition: A | Discharge status: Other Acute Inpatient Hospital | Payer: PRIVATE HEALTH INSURANCE | Source: Intra-hospital | Admitting: Spinal Cord Injury Medicine

## 2021-06-01 ENCOUNTER — Ambulatory Visit
Admission: TF | Admit: 2021-06-01 | Payer: PRIVATE HEALTH INSURANCE | Source: Intra-hospital | Admitting: Spinal Cord Injury Medicine

## 2021-06-17 ENCOUNTER — Ambulatory Visit: Admit: 2021-06-17 | Discharge: 2021-06-25 | Payer: PRIVATE HEALTH INSURANCE

## 2021-06-17 ENCOUNTER — Encounter: Admit: 2021-06-17 | Payer: PRIVATE HEALTH INSURANCE

## 2021-06-17 ENCOUNTER — Ambulatory Visit
Admission: TF | Admit: 2021-06-17 | Discharge: 2021-06-25 | Disposition: A | Discharge status: Rehabilitation Facility | Payer: PRIVATE HEALTH INSURANCE | Source: Intra-hospital | Admitting: Plastic and Reconstructive Surgery

## 2021-06-25 ENCOUNTER — Ambulatory Visit
Admission: TF | Admit: 2021-06-25 | Disposition: A | Discharge status: Still In Hospital | Payer: PRIVATE HEALTH INSURANCE | Source: Intra-hospital | Admitting: Spinal Cord Injury Medicine

## 2021-06-25 ENCOUNTER — Ambulatory Visit
Admission: TF | Admit: 2021-06-25 | Discharge: 2021-07-18 | Disposition: A | Discharge status: Other Acute Inpatient Hospital | Payer: PRIVATE HEALTH INSURANCE | Source: Intra-hospital | Admitting: Spinal Cord Injury Medicine

## 2021-06-25 MED ORDER — ACETAMINOPHEN 325 MG TABLET
ORAL_TABLET | Freq: Four times a day (QID) | ORAL | 0 refills | 21 days | Status: SS
Start: 2021-06-25 — End: 2021-07-16

## 2021-06-25 MED ORDER — METHOCARBAMOL 500 MG TABLET
ORAL_TABLET | Freq: Four times a day (QID) | ORAL | 0 refills | 23 days | Status: SS | PRN
Start: 2021-06-25 — End: 2021-07-25

## 2021-07-15 DIAGNOSIS — T8131XA Disruption of external operation (surgical) wound, not elsewhere classified, initial encounter: Principal | ICD-10-CM

## 2021-07-18 ENCOUNTER — Encounter
Admit: 2021-07-18 | Discharge: 2021-07-23 | Payer: PRIVATE HEALTH INSURANCE | Attending: Student in an Organized Health Care Education/Training Program

## 2021-07-18 ENCOUNTER — Ambulatory Visit
Admission: TF | Admit: 2021-07-18 | Discharge: 2021-07-23 | Disposition: A | Discharge status: Rehabilitation Facility | Payer: PRIVATE HEALTH INSURANCE | Source: Intra-hospital | Admitting: Plastic and Reconstructive Surgery

## 2021-07-18 ENCOUNTER — Encounter
Admission: TF | Admit: 2021-07-18 | Discharge: 2021-07-23 | Disposition: A | Discharge status: Rehabilitation Facility | Payer: PRIVATE HEALTH INSURANCE | Source: Intra-hospital | Attending: Student in an Organized Health Care Education/Training Program | Admitting: Plastic and Reconstructive Surgery

## 2021-07-23 ENCOUNTER — Ambulatory Visit
Admission: TF | Admit: 2021-07-23 | Discharge: 2021-08-05 | Disposition: A | Discharge status: Home Health | Payer: PRIVATE HEALTH INSURANCE | Source: Intra-hospital | Admitting: Spinal Cord Injury Medicine

## 2021-08-03 MED ORDER — DOCUSATE SODIUM 100 MG CAPSULE
ORAL_CAPSULE | Freq: Two times a day (BID) | ORAL | 2 refills | 30 days | Status: CP
Start: 2021-08-03 — End: 2021-09-02
  Filled 2021-08-05: qty 150, 30d supply, fill #0

## 2021-08-03 MED ORDER — PSYLLIUM HUSK (ASPARTAME) 3.4 GRAM ORAL POWDER PACKET
PACK | Freq: Every day | ORAL | 2 refills | 30.00000 days | Status: CP
Start: 2021-08-03 — End: 2021-09-02

## 2021-08-03 MED ORDER — METHOCARBAMOL 500 MG TABLET
ORAL_TABLET | Freq: Two times a day (BID) | ORAL | 0 refills | 30.00000 days | Status: CP | PRN
Start: 2021-08-03 — End: 2021-09-02
  Filled 2021-08-05: qty 60, 30d supply, fill #0

## 2021-08-03 MED ORDER — BUSPIRONE 15 MG TABLET
ORAL_TABLET | Freq: Three times a day (TID) | ORAL | 0 refills | 30.00000 days | Status: CP
Start: 2021-08-03 — End: 2021-09-02
  Filled 2021-08-05: qty 90, 30d supply, fill #0

## 2021-08-03 MED ORDER — POLYETHYLENE GLYCOL 3350 17 GRAM/DOSE ORAL POWDER
Freq: Every day | ORAL | 2 refills | 30.00000 days | Status: CP
Start: 2021-08-03 — End: 2021-09-02
  Filled 2021-08-05: qty 510, 30d supply, fill #0

## 2021-08-03 MED ORDER — OXYBUTYNIN CHLORIDE 5 MG TABLET
ORAL_TABLET | Freq: Three times a day (TID) | ORAL | 2 refills | 30 days | Status: CP
Start: 2021-08-03 — End: 2022-08-03
  Filled 2021-08-05: qty 90, 30d supply, fill #0

## 2021-08-03 MED ORDER — OXYCODONE 5 MG TABLET
ORAL_TABLET | Freq: Four times a day (QID) | ORAL | 0 refills | 5 days | Status: CP | PRN
Start: 2021-08-03 — End: 2021-08-08
  Filled 2021-08-05: qty 20, 5d supply, fill #0

## 2021-08-03 MED ORDER — SENNOSIDES 8.6 MG TABLET
ORAL_TABLET | Freq: Every day | ORAL | 2 refills | 30 days | Status: CP
Start: 2021-08-03 — End: 2021-09-02
  Filled 2021-08-05: qty 60, 30d supply, fill #0

## 2021-08-03 MED ORDER — TAMSULOSIN 0.4 MG CAPSULE
ORAL_CAPSULE | Freq: Every evening | ORAL | 2 refills | 30 days | Status: CP
Start: 2021-08-03 — End: 2022-08-03
  Filled 2021-08-05: qty 30, 30d supply, fill #0

## 2021-08-03 MED ORDER — DOCUSATE SODIUM 283 MG/5 ML ENEMA
Freq: Every evening | RECTAL | 1 refills | 30.00000 days | Status: CP
Start: 2021-08-03 — End: 2021-09-02

## 2021-08-03 MED ORDER — LEVETIRACETAM 750 MG TABLET
ORAL_TABLET | Freq: Two times a day (BID) | ORAL | 1 refills | 30.00000 days | Status: CP
Start: 2021-08-03 — End: 2021-09-02
  Filled 2021-08-05: qty 60, 30d supply, fill #0

## 2021-08-03 MED ORDER — ACETAMINOPHEN 500 MG TABLET
ORAL_TABLET | Freq: Three times a day (TID) | ORAL | 1 refills | 30 days | Status: CP
Start: 2021-08-03 — End: 2021-09-02
  Filled 2021-08-05: qty 180, 30d supply, fill #0

## 2021-08-05 MED FILL — DOCUSATE SODIUM 100 MG CAPSULE: ORAL | 30 days supply | Qty: 60 | Fill #0

## 2021-08-06 ENCOUNTER — Encounter: Admit: 2021-08-06 | Discharge: 2021-09-04 | Payer: PRIVATE HEALTH INSURANCE

## 2021-08-06 ENCOUNTER — Encounter: Admit: 2021-08-06 | Payer: PRIVATE HEALTH INSURANCE

## 2021-08-06 ENCOUNTER — Inpatient Hospital Stay: Admit: 2021-08-04 | Payer: PRIVATE HEALTH INSURANCE

## 2021-08-10 MED ORDER — AZATHIOPRINE 50 MG TABLET
ORAL_TABLET | 0 refills | 0 days | Status: CP
Start: 2021-08-10 — End: ?

## 2021-08-17 ENCOUNTER — Ambulatory Visit: Admit: 2021-08-17 | Discharge: 2021-08-17 | Payer: PRIVATE HEALTH INSURANCE

## 2021-08-17 DIAGNOSIS — S21209S Unspecified open wound of unspecified back wall of thorax without penetration into thoracic cavity, sequela: Principal | ICD-10-CM

## 2021-08-23 ENCOUNTER — Ambulatory Visit: Admit: 2021-08-23 | Discharge: 2021-08-24 | Payer: PRIVATE HEALTH INSURANCE

## 2021-08-23 DIAGNOSIS — S14151A Other incomplete lesion at C1 level of cervical spinal cord, initial encounter: Principal | ICD-10-CM

## 2021-08-23 DIAGNOSIS — K50819 Crohn's disease of both small and large intestine with unspecified complications: Principal | ICD-10-CM

## 2021-08-23 DIAGNOSIS — Z0000 Encounter for general adult medical examination without abnormal findings: Principal | ICD-10-CM

## 2021-09-02 ENCOUNTER — Ambulatory Visit: Admit: 2021-09-02 | Discharge: 2021-09-03 | Payer: PRIVATE HEALTH INSURANCE

## 2021-09-02 DIAGNOSIS — S21209S Unspecified open wound of unspecified back wall of thorax without penetration into thoracic cavity, sequela: Principal | ICD-10-CM

## 2021-09-03 MED ORDER — BUSPIRONE 15 MG TABLET
ORAL_TABLET | Freq: Three times a day (TID) | ORAL | 11 refills | 30.00000 days | Status: CP
Start: 2021-09-03 — End: 2022-09-03

## 2021-09-03 MED ORDER — METHOCARBAMOL 500 MG TABLET
ORAL_TABLET | Freq: Two times a day (BID) | ORAL | 1 refills | 45.00000 days | Status: CP | PRN
Start: 2021-09-03 — End: 2022-09-03

## 2021-09-05 ENCOUNTER — Encounter: Admit: 2021-09-05 | Payer: PRIVATE HEALTH INSURANCE

## 2021-09-05 ENCOUNTER — Ambulatory Visit: Admit: 2021-09-05 | Payer: PRIVATE HEALTH INSURANCE

## 2021-09-05 MED FILL — LEVETIRACETAM 750 MG TABLET: ORAL | 30 days supply | Qty: 60 | Fill #0

## 2021-09-05 MED FILL — SENNA 8.6 MG TABLET: ORAL | 30 days supply | Qty: 60 | Fill #0

## 2021-09-05 MED FILL — TAMSULOSIN 0.4 MG CAPSULE: ORAL | 30 days supply | Qty: 30 | Fill #0

## 2021-09-05 MED FILL — ACETAMINOPHEN 500 MG TABLET: ORAL | 30 days supply | Qty: 180 | Fill #0

## 2021-09-05 MED FILL — OXYBUTYNIN CHLORIDE 5 MG TABLET: ORAL | 30 days supply | Qty: 90 | Fill #0

## 2021-09-07 ENCOUNTER — Ambulatory Visit: Admit: 2021-09-07 | Discharge: 2021-09-08 | Payer: PRIVATE HEALTH INSURANCE

## 2021-09-07 DIAGNOSIS — K50819 Crohn's disease of both small and large intestine with unspecified complications: Principal | ICD-10-CM

## 2021-09-07 DIAGNOSIS — A630 Anogenital (venereal) warts: Principal | ICD-10-CM

## 2021-09-07 DIAGNOSIS — M459 Ankylosing spondylitis of unspecified sites in spine: Principal | ICD-10-CM

## 2021-09-07 DIAGNOSIS — S14151A Other incomplete lesion at C1 level of cervical spinal cord, initial encounter: Principal | ICD-10-CM

## 2021-09-10 DIAGNOSIS — S14151A Other incomplete lesion at C1 level of cervical spinal cord, initial encounter: Principal | ICD-10-CM

## 2021-09-14 ENCOUNTER — Ambulatory Visit: Admit: 2021-09-14 | Discharge: 2021-09-15 | Payer: PRIVATE HEALTH INSURANCE

## 2021-09-23 ENCOUNTER — Ambulatory Visit: Admit: 2021-09-23 | Discharge: 2021-09-24 | Payer: PRIVATE HEALTH INSURANCE

## 2021-09-23 ENCOUNTER — Ambulatory Visit
Admit: 2021-09-23 | Discharge: 2021-09-24 | Payer: PRIVATE HEALTH INSURANCE | Attending: Student in an Organized Health Care Education/Training Program | Primary: Student in an Organized Health Care Education/Training Program

## 2021-09-23 DIAGNOSIS — S21209S Unspecified open wound of unspecified back wall of thorax without penetration into thoracic cavity, sequela: Principal | ICD-10-CM

## 2021-09-23 DIAGNOSIS — Q231 Congenital insufficiency of aortic valve: Principal | ICD-10-CM

## 2021-09-23 DIAGNOSIS — I480 Paroxysmal atrial fibrillation: Principal | ICD-10-CM

## 2021-09-29 MED ORDER — BUSPIRONE 15 MG TABLET
ORAL_TABLET | Freq: Three times a day (TID) | ORAL | 3 refills | 90 days | Status: CP
Start: 2021-09-29 — End: 2022-09-29

## 2021-09-30 DIAGNOSIS — Q231 Congenital insufficiency of aortic valve: Principal | ICD-10-CM

## 2021-10-04 MED ORDER — TAMSULOSIN 0.4 MG CAPSULE
ORAL_CAPSULE | Freq: Every evening | ORAL | 2 refills | 30 days | Status: CP
Start: 2021-10-04 — End: 2022-10-04

## 2021-10-04 MED ORDER — OXYBUTYNIN CHLORIDE 5 MG TABLET
ORAL_TABLET | Freq: Three times a day (TID) | ORAL | 2 refills | 30 days | Status: CP
Start: 2021-10-04 — End: 2022-10-04

## 2021-10-07 MED ORDER — OXYBUTYNIN CHLORIDE 5 MG TABLET
ORAL_TABLET | Freq: Three times a day (TID) | ORAL | 2 refills | 30 days | Status: CP
Start: 2021-10-07 — End: 2022-10-07

## 2021-10-07 MED ORDER — TAMSULOSIN 0.4 MG CAPSULE
ORAL_CAPSULE | Freq: Every evening | ORAL | 2 refills | 30 days | Status: CP
Start: 2021-10-07 — End: 2022-10-07

## 2021-10-11 ENCOUNTER — Ambulatory Visit: Admit: 2021-10-11 | Discharge: 2021-10-12 | Payer: PRIVATE HEALTH INSURANCE

## 2021-10-14 ENCOUNTER — Ambulatory Visit: Admit: 2021-10-14 | Discharge: 2021-10-15 | Payer: PRIVATE HEALTH INSURANCE

## 2021-10-14 DIAGNOSIS — S21209S Unspecified open wound of unspecified back wall of thorax without penetration into thoracic cavity, sequela: Principal | ICD-10-CM

## 2021-10-18 ENCOUNTER — Ambulatory Visit
Admit: 2021-10-18 | Discharge: 2021-10-19 | Payer: PRIVATE HEALTH INSURANCE | Attending: Spinal Cord Injury Medicine | Primary: Spinal Cord Injury Medicine

## 2021-10-21 ENCOUNTER — Ambulatory Visit: Admit: 2021-10-21 | Payer: PRIVATE HEALTH INSURANCE

## 2021-10-28 ENCOUNTER — Ambulatory Visit: Admit: 2021-10-28 | Discharge: 2021-11-26 | Payer: PRIVATE HEALTH INSURANCE

## 2021-10-28 ENCOUNTER — Ambulatory Visit
Admit: 2021-10-28 | Payer: PRIVATE HEALTH INSURANCE | Attending: Rehabilitative and Restorative Service Providers" | Primary: Rehabilitative and Restorative Service Providers"

## 2021-10-28 ENCOUNTER — Ambulatory Visit: Admit: 2021-10-28 | Payer: PRIVATE HEALTH INSURANCE

## 2021-11-04 ENCOUNTER — Ambulatory Visit: Admit: 2021-11-04 | Discharge: 2021-11-05 | Payer: PRIVATE HEALTH INSURANCE

## 2021-11-04 DIAGNOSIS — S21209S Unspecified open wound of unspecified back wall of thorax without penetration into thoracic cavity, sequela: Principal | ICD-10-CM

## 2021-11-23 ENCOUNTER — Ambulatory Visit: Admit: 2021-11-23 | Discharge: 2021-11-24 | Payer: PRIVATE HEALTH INSURANCE

## 2021-11-23 DIAGNOSIS — K50819 Crohn's disease of both small and large intestine with unspecified complications: Principal | ICD-10-CM

## 2021-11-23 DIAGNOSIS — Z79899 Other long term (current) drug therapy: Principal | ICD-10-CM

## 2021-11-23 DIAGNOSIS — T8131XD Disruption of external operation (surgical) wound, not elsewhere classified, subsequent encounter: Principal | ICD-10-CM

## 2021-11-23 MED ORDER — HUMIRA PEN CITRATE FREE STARTER PACK FOR CROHN'S/UC/HS 3 X 80 MG/0.8 ML
0 refills | 0 days | Status: CP
Start: 2021-11-23 — End: ?

## 2021-11-23 MED ORDER — HUMIRA PEN CITRATE FREE 40 MG/0.4 ML
SUBCUTANEOUS | 5 refills | 28 days | Status: CP
Start: 2021-11-23 — End: ?
  Filled 2021-12-13: qty 3, 28d supply, fill #0

## 2021-11-23 NOTE — Unmapped (Addendum)
Per Dr. Raphael Gibney, need to restart on Humira and then continue with maintenance therapy weekly. Script sent to [facility].    Attempted to call pt x2 to update him on the following per Dr. Raphael Gibney:  1. I spoke with the PA in plastic surgery clinic - they are ok with restarting Humira.   2. ??His labs show low iron and high inflammatory tests, likely due to the Crohn's disease starting to become active.   3. ??He should start an oral iron supplement. ??If he has constipation or discomfort with it, he can contact us and also start a stool softener like colace 400mg  daily.    Sent [employer].     Pt sent back mychart message confirming agreement and understanding with plan.

## 2021-11-26 NOTE — Unmapped (Signed)
OUTPATIENT PHYSICAL THERAPY  Daily Note    Patient Name: Kerry Mooney  Date of Birth:[birth year]  Date: 11/26/2021  Session Number: 5(5/10 to reassessment)  Therapy Diagnosis:   Encounter Diagnoses   Name Primary?    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 10/28/21 - 01/27/22  Has FAP      Primary Therapist: Rhea Belton, PT, DPT    Precautions:  Open surgical wound on back, actively seeing [facility] plastic surgery for wound care.    SCI Level: C2 ASIA D SCI    COVID Mask use: Patient wore a mask for the entire therapy session. and Patient and/or caregiver opted not to wear mask for duration or majority of session per updated clinic mask policy.      Assessment:   Patient brought AFO today. Today's session focused on coordination and strengthening, as well as postural exercises.  Patient trialed walking sticks to incorporate arm swing and more upright posture as he is still in need of B UE support for dynamic balance, coordination improving.  Also worked on floor transfers and patient was able to get up with Mod Ind and external support. Kerry Mooney  He tolerated today's session well but continues to demonstrate improved  foot clearance with consistent use of AFO.  Patient verbalized understanding to issued education and training. Patient continues to progress toward stated goals with skilled intervention which will maximize functional capacity and decrease burden of care. However, [facility] would still benefit from skilled therapy.     Communication/consultation with other professionals: POC discussed with primary PT - Rhea Belton, PT, DPT    Prior Objective Testing  Tests Eval (10/28/21) 11/26/21   TUG 15.9 seconds, no UE support 5x STS 28.6 seconds    WISCI II Level 6 (walker, brace on R, Min A - SBA x 1) Level 12 - (two crutches, brace on right, no assist)   10 MWT  TBD  0.62 m/s with walker      Problem List: decreased UE and LE strength, impaired balance, decreased ROM, impaired posture, deconditioning, impaired functional mobility, impaired ADLs and edema    Plan     Next session: 10 MWT with no AD, TUG, 5 STS,  Stairs with one rail    Freq/Duration: per evaluation (10/28/21)  2x a week for 12 weeks     Home Exercise ID: [id number]    Goals:  Patient/Family Goals:I want to be strong enough to walk without the walker and make walking my primary means of getting around.     Short Term Goals:  In 6 weeks:  Patient will be able to properly demonstrate current HEP independently x1 in clinic to build upon functional gains in PT.    Patient will ambulate 500 feet with RW, AFO donned on R LE, and decreased gait deviation to allow return to household/community ambulation.  Patient will ascend and descend 3 stairs with CGA assistance, with  1 or no handrails and safe technique demonstrating step through pattern in order to increase independence with household/community mobility.  Patient will be able to perform floor transfer with bilateral UE support and supervision assist.    Patient will improve Timed Up  and Go score to < 12 seconds to further reduce fall risk.  Patient will ambulate >1.0 meters/second to decrease fall risk and improve household/community ambulation  Patient will complete FGA to assess dynamic balance during gait.  Patient will complete .      Long Term Goals:  12  weeks:  Patient will use ambulation with LRAD as primary means of mobility more than 60% of the time.   Patient will be able to ambulate 1,070ft with minimal gait deviations and AFO donned on R LE to return to community ambulation and demonstrate improved ambulation endurance.   Patient will ascend and descend 6 stairs with SBA assistance, with no handrails and safe technique demonstrating step through pattern in order to increase independence with household/community mobility.  Patient will be able to perform floor transfer with no external support and SBA.   Patient will improve TUG score to < 10 seconds to further reduce falls risk.   Patient will achieve FGA score of 27/30 or greater to demonstrate improved dynamic balance during gait and reduce future risk for falls.    Subjective:   Pt states: i have been walking around more  HEP: has been keeping up with standing exercises  Pain: no complaints    Reason for Referral/History of Present Condition/Onset of injury/exacerbation:   41 y.o. male presents with PMH of C2 ASIA D SCI from 40ft fall through attic (04/11/2021), Patient has a PMH of seizures, ankylosing spondylitis, TBI and crohns disease. Per MD note, patient was admitted to [facility] AIR from 10/18-10/24/2022, readmitted to [facility] on 05/03/21 for assessment of cervical wound due to drainage and concern for infection and readmitted to [facility] AIR from 06/01/21 to 06/16/21 before requiring transfer back to acute for wound debridement/washout. Patient was readmitted again to [facility] from 06/25/21-07/18/21 before being transferred back to [facility] main for wound washout     Objective:       There Act: 10 min     Floor transfers with Mod Ind and use of external support.  Attempted without external support but lacks leg strength to fully stand.      Stand to Sit  with eccentric control on a 4 count  2 sets of 5 reps to 19 inch mat    There Ex:  25 min     Standing Glute Sets  with no AD  x 10 reps.     Ambulation x 350 feet with Trekking poles and 4 point  reciprocal pattern.  Improving coordination with decreased LOB.  Patient wore AFO to session today which was helpful.     Has Trekking poles that he can practice with at home.     Ambulation 110 feet with AFO, but  no AD and step to > step through gait patten.  Right leg fatigues more quickly than left.      PPTM:   10 min 10 MWT:  Walker 0.609 m/s     Trekking Poles   0.22 m/s     Workign on turns with trekking poles  and CGA     With no AD with Mod A.      HEP:    Postural Exercises:   Shoulder ER with hands behind head  Horizontal Abd/Add  Shoulder Flexion: I and Y  TA - Alternating Heel slides,  TA - Alternating Hip Abd/ Add,    Supine bicycle legs    Education: Importance of stretching and ROM as it relates to strengthening.  Consistent use  of AFO to reduce foot drop or foot slap.      Home Exercise Program:    - sit<>stand with UE extended, eccentric lowering emphasized   - at half wall for postural support:    - hip abd with glut med focus (2 UE support)    - hip flex (1 UE support)    - hip flex into extension (straight knee, 1 UE support)    [employer] [id number]  Added 11/24/21    Postural Exercises:   Shoulder ER with hands behind head  Horizontal Abd/Add  Shoulder Flexion: I and Y  TA - Alternating Heel slides,  TA - Alternating Hip Abd/ Add,    Supine bicycle legs      Treatment Rendered:    Total Treatment Time: 45 min     I attest that I have reviewed the above information.  Signed: Vira Blanco, PT, DPT  11/26/2021 2:37 PM

## 2021-11-29 ENCOUNTER — Ambulatory Visit: Admit: 2021-11-29 | Payer: PRIVATE HEALTH INSURANCE

## 2021-11-29 ENCOUNTER — Ambulatory Visit
Admit: 2021-11-29 | Discharge: 2021-12-26 | Payer: PRIVATE HEALTH INSURANCE | Attending: Rehabilitative and Restorative Service Providers" | Primary: Rehabilitative and Restorative Service Providers"

## 2021-11-29 ENCOUNTER — Ambulatory Visit: Admit: 2021-11-29 | Discharge: 2021-12-26 | Payer: PRIVATE HEALTH INSURANCE

## 2021-11-29 MED ORDER — TAMSULOSIN 0.4 MG CAPSULE
ORAL_CAPSULE | Freq: Every day | ORAL | 3 refills | 90 days | Status: CP
Start: 2021-11-29 — End: 2022-11-29

## 2021-11-29 MED ORDER — OXYBUTYNIN CHLORIDE 5 MG TABLET
ORAL_TABLET | Freq: Three times a day (TID) | ORAL | 3 refills | 90 days | Status: CP
Start: 2021-11-29 — End: 2022-11-29

## 2021-11-29 NOTE — Unmapped (Signed)
[facility] Specialty Medication Onboarding    Specialty Medication: HUMIRA(CF) PEN 40 mg/0.4 mL injection (adalimumab)  Prior Authorization: Approved   Financial Assistance: No - copay  <$25  Final Copay/Day Supply: $0 / 28 (loading)          $0 / 28 (maintenance)    Insurance Restrictions: None     Notes to Pharmacist: n/a    The triage team has completed the benefits investigation and has determined that the patient is able to fill this medication at [facility]. Please contact the patient to complete the onboarding or follow up with the prescribing physician as needed.

## 2021-11-29 NOTE — Unmapped (Signed)
OUTPATIENT PHYSICAL THERAPY  Daily Note    Patient Name: Kerry Mooney  Date of Birth:[birth year]  Date: 11/29/2021  Session Number: 6(6/10 to reassessment)  Therapy Diagnosis:   Encounter Diagnoses   Name Primary?    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    SCI Level: C2 ASIA D SCI    COVID Mask use: Patient wore a mask for the entire therapy session. and Patient and/or caregiver opted not to wear mask for duration or majority of session per updated clinic mask policy.      Assessment:   Patient brought AFO today. Today's session focused  TUG, 5 STS, Stairs and without AD given that patient reports he is now walking primarily without AD at home.  Patient has improved in all areas (as seen in the chart below) .  It is important to note that TUG and 10 MWT were performed without UE support.   Patient continues to progress toward stated goals with skilled intervention which will maximize functional capacity and decrease burden of care.       Communication/consultation with other professionals: POC discussed with therapy team.     Prior Objective Testing  Tests Eval (10/28/21) 11/26/21 11/29/21   TUG 15.9 seconds, RW   23.85 sec with no UE support    5x STS 28.6 seconds  20.91 sec    WISCI II Level 6 (walker, brace on R, Min A - SBA x 1) Level 12 - (two crutches, brace on right, no assist)    10 MWT  TBD  0.62 m/s with walker 0.47 m/s      Problem List: decreased UE and LE strength, impaired balance, decreased ROM, 23.85 sec with no UE support    5x STS 28.6 seconds  20.91 sec    WISCI II Level 6 (walker, brace on R, Min A - SBA x 1) Level 12 - (two crutches, brace on right, no assist)    10 MWT  TBD  0.62 m/s with walker 0.47 m/s      Problem List: decreased UE and LE strength, impaired balance, decreased ROM, impaired posture, deconditioning, impaired functional mobility, impaired ADLs and edema    Plan     Next session: 10 MWT with no AD, TUG, 5 STS,  Stairs with one rail    Freq/Duration: per evaluation (10/28/21)  2x a week for 12 weeks     Home Exercise ID: [id number]    Goals:  Patient/Family Goals:I want to be strong enough to walk without the walker and make walking my primary means of getting around.     Short Term Goals:  In 6 weeks:  Patient will be able to properly demonstrate current HEP independently x1 in clinic to build upon functional gains in PT.    Patient will ambulate 500 feet with RW, AFO donned on R LE, and decreased gait deviation to allow return to household/community ambulation.  Patient will ascend and descend  3 stairs with CGA assistance, with  1 or no handrails and safe technique demonstrating step through pattern in order to increase independence with household/community mobility.  Patient will be able to perform floor transfer with bilateral UE support and supervision assist.    Patient will improve Timed Up and Go score to < 12 seconds to further reduce fall risk.  Patient will ambulate >1.0 meters/second to decrease fall risk and improve household/community ambulation  Patient will complete FGA to assess dynamic balance during gait.  Patient will complete .  Met 11/29/21      Long Term Goals:  12  weeks:  Patient will use ambulation with LRAD as primary means of mobility more than 60% of the time.   Patient will be able to ambulate 1,024ft with minimal gait deviations and AFO donned on R LE to return to community ambulation and demonstrate improved ambulation endurance.   Patient will ascend and descend 6 stairs with SBA assistance, with no handrails and safe technique demonstrating step through pattern in order to increase independence with household/community mobility.  Patient will be able to perform floor transfer with no external support and SBA.   Patient will improve TUG score to < 10 seconds to further reduce falls risk.   Patient will achieve FGA score of 27/30 or greater to demonstrate improved dynamic balance during gait and reduce future risk for falls.    Subjective:   Pt states: i have been walking around more  HEP: has been keeping up with standing exercises  Pain: no complaints    Reason for Referral/History of Present Condition/Onset of injury/exacerbation:   Kerry Mooney presents with PMH of C2 ASIA D SCI from 51ft fall through attic (04/11/2021), Patient has a PMH of seizures, ankylosing spondylitis, TBI and crohns disease. Per MD note, patient was admitted to [facility] AIR from 10/18-10/24/2022, readmitted to [facility] on 05/03/21 for assessment of cervical wound due to drainage and concern for infection and readmitted to [facility] AIR from 06/01/21 to 06/16/21 before requiring transfer back to acute for wound debridement/washout. Patient was readmitted again to [facility] AIR from 06/25/21-07/18/21 before being transferred back to [facility] main for wound washout     Objective:           There Ex:  25 min     Bridge  x 10  with ball squeeze     Standing Glute Sets  with no AD  x 10 reps.     Ambulation x 350 feet with Trekking poles and 4 point  reciprocal pattern.  Improving coordination with decreased LOB.  Patient wore AFO to session today which was helpful.     Has Trekking poles that he can practice with at home.     Ambulation 110 feet with AFO, but  no AD and step to > step through gait patten.  Right leg fatigues more quickly than left.      Advanced There Ex:     Double knees to chest   SLR   Bridge with ball squeeze and LAQ        PPTM:   10 min       10 MWT:  Walker 0.609 m/s     Trekking support:    - hip abd with glut med focus (2 UE support)    - hip flex (1 UE support)    - hip flex into extension (straight knee, 1 UE support)    [employer] [id number]  Added 11/24/21  Postural Exercises:   Shoulder ER with hands behind head  Horizontal Abd/Add  Shoulder Flexion: I and Y  TA - Alternating Heel slides,  TA - Alternating Hip Abd/ Add,    Supine bicycle legs      Treatment Rendered:    Total Treatment Time: 45 min   PPTM:15 min  TE:30 min     I attest that I have reviewed the above information.  Signed: Vira Blanco, PT, DPT  11/29/2021 1:03 PM

## 2021-11-29 NOTE — Unmapped (Addendum)
PA approved for Humira starter pack from 11/24/21-11/23/22.  Humira weekly dosing approved through 02/11/22.    [facility] to reach out to pt today to schedule delivery.

## 2021-11-29 NOTE — Unmapped (Signed)
[facility]   Patient Onboarding/Medication Counseling    Mr.Swarey is a 41 y.o. male with crohn's disease who I am counseling today on initiation of therapy.  I am speaking to the patient.    Was a Nurse, learning disability used for this call? No    Verified patient's date of birth / HIPAA.    Specialty medication(s) to be sent: Inflammatory Disorders: Humira      Non-specialty medications/supplies to be sent: n/a      Medications not needed at this time: n/a         Humira (adalimumab)    The patient declined counseling on medication administration, missed dose instructions, goals of therapy, side effects and monitoring parameters, warnings and precautions, drug/food interactions, and storage, handling precautions, and disposal because they have taken the medication previously. The information in the declined sections below are for informational purposes only and was not discussed with patient.     Medication & Administration     Dosage: Crohn's Disease: Inject 160mg  under the skin on day 1, 80mg  on day 15, then 40mg  every 7 days starting on day 29    Lab tests required prior to treatment initiation:  Tuberculosis: Tuberculosis screening resulted in a non-reactive Quantiferon TB Gold assay. (Completed 07/15/14)  Hepatitis B: Hepatitis B serology studies are complete and non-reactive.    Administration:     Prefilled auto-injector pen  1. Gather all supplies needed for injection on a clean, flat working surface: medication pen removed from packaging, alcohol swab, sharps container, etc.  2. Look at the medication label - look for correct medication, correct dose, and check the expiration date  3. Look at the medication - the liquid visible in the window on the side of the pen device should appear clear and colorless  4. Lay the auto-injector pen on a flat surface and allow it to warm up to room temperature for at least 30-45 minutes  5. Select injection site - you can use the front of your thigh or your belly (but not the area 2 inches around your belly button); if someone else is giving you the injection you can also use your upper arm in the skin covering your triceps muscle  6. Prepare injection site - wash your hands and clean the skin at the injection site with an alcohol swab and let it air dry, do not touch the injection site again before the injection  7. Pull the 2 safety caps straight off - gray/white to uncover the needle cover and the plum cap to uncover the plum activator button, do not remove until immediately prior to injection and do not touch the white needle cover  8. Gently squeeze the area of cleaned skin and hold it firmly to create a firm surface at the selected injection site  9. Put the white needle cover against your skin at the injection site at a 90 degree angle, hold the pen such that you can see the clear medication window  10. Press down and hold the pen firmly against your skin, press the plum activator button to initiate the injection, there will be a click when the injection starts  11. Continue to hold the pen firmly against your skin for about 10-15 seconds - the window will start to turn solid yellow  12. To verify the injection is complete after 10-15 seconds, look and ensure the window is solid yellow and then pull the pen away from your skin  13. Dispose of  the used auto-injector pen immediately in your sharps disposal container the needle will be covered automatically  14. If you see any blood at the injection site, press a cotton ball or gauze on the site and maintain pressure until the bleeding stops, do not rub the injection site    Adherence/Missed dose instructions:  If your injection is given more than 3 days after your scheduled injection date - consult your pharmacist for additional instructions on how to adjust your dosing schedule.    Goals of Therapy     - Achieve remission of symptoms  - Maintain remission of symptoms  - Minimize long-term systemic glucocorticoid use  - Prevent need for surgical procedures  - Maintenance of effective psychosocial functioning    Side Effects & Monitoring Parameters     Injection site reaction (redness, irritation, inflammation localized to the site of administration)  Signs of a common cold - minor sore throat, runny or stuffy nose, etc.  Upset stomach  Headache    The following side effects should be reported to the provider:  Signs of a hypersensitivity reaction - rash; hives; itching; red, swollen, blistered, or peeling skin; wheezing; tightness in the chest or throat; difficulty breathing, swallowing, or talking; swelling of the mouth, face, lips, tongue, or throat; etc.  Reduced immune function - report signs of infection such as fever; chills; body aches; very bad sore throat; ear or sinus pain; cough; more sputum or change in color of sputum; pain with passing urine; wound that will not heal, etc.  Also at a slightly higher risk of some malignancies (mainly skin and blood cancers) due to this reduced immune function.  In the case of signs of infection - the patient should hold the next dose of Humira?? and call your primary care provider to ensure adequate medical care.  Treatment may be resumed when infection is treated and patient is asymptomatic.  Changes in skin - a new growth or lump that forms; changes in shape, size, or color of a previous mole or marking  Signs of unexplained bruising or bleeding - throwing up blood or emesis that looks like coffee grounds; black, tarry, or bloody stool; etc.  Signs of new or worsening heart failure - shortness of breath; sudden weight gain; heartbeat that is not normal; swelling in the arms or legs that is new or worse      Contraindications, Warnings, & Precautions     Have your bloodwork checked as you have been told by your prescriber  Talk with your doctor if you are pregnant, planning to become pregnant, or breastfeeding  Discuss the possible need for holding your dose(s) of Humira?? when a planned procedure is scheduled with the prescriber as it may delay healing/recovery timeline       Drug/Food Interactions     Medication list reviewed in Epic. The patient was instructed to inform the care team before taking any new medications or supplements.  Anti-TNF Agents may enhance the adverse/toxic effect of Thiopurine Analogs .   Talk with you prescriber or pharmacist before receiving any live vaccinations while taking this medication and after you stop taking it    Storage, Handling Precautions, & Disposal     Store this medication in the refrigerator.  Do not freeze  If needed, you may store at room temperature for up to 14 days  Store in original packaging, protected from light  Do not shake  Dispose of used syringes/pens in a sharps disposal container  Current Medications (including OTC/herbals), Comorbidities and Allergies     Current Outpatient Medications   Medication Sig Dispense Refill    azaTHIOprine (IMURAN) 50 mg tablet TAKE 3 TABLETS BY MOUTH EVERY DAY 270 tablet 0    busPIRone (BUSPAR) 15 MG tablet TAKE 1 TABLET (15 MG TOTAL) BY MOUTH THREE (3) TIMES A DAY. 270 tablet 3    HUMIRA PEN CITRATE FREE 40 MG/0.4 ML Inject the contents of 1 pen (40 mg total) under the skin every seven (7) days. 4 each 5    HUMIRA PEN CITRATE FREE STARTER PACK FOR CROHN'S/UC/HS 3 X 80 MG/0.8 ML Inject the contents of 2 pens (160 mg total) day 1, then inject the contents of 1 pen (80 mg total) on day 15 for loading dose. 3 each 0    levETIRAcetam (KEPPRA) 1000 MG tablet TAKE 1 TABLET BY MOUTH EVERY 12 HOURS      methocarbamoL (ROBAXIN) 500 MG tablet Take 1 tablet (500 mg total) by mouth two (2) times a day as needed for pain. 90 tablet 1    oxybutynin (DITROPAN) 5 MG tablet Take 1 tablet (5 mg total) by mouth Three (3) times a day. 270 tablet 3    tamsulosin (FLOMAX) 0.4 mg capsule Take 1 capsule (0.4 mg total) by mouth daily. 90 capsule 3     No current facility-administered medications for this visit. Allergies   Allergen Reactions    Infliximab      Seizure activity  Other reaction(s): Other (See Comments)  Seizures       Patient Active Problem List   Diagnosis    Dens fracture (CMS-HCC)    Crohn's disease of small and large intestines with complication (CMS-HCC)    Arthritis associated with inflammatory bowel disease    Anal condyloma    Wound dehiscence, surgical    Fall    Postoperative wound infection    Pressure injury of skin    Protein-calorie malnutrition, severe (CMS-HCC)    Spondylolysis    Crohn's colitis (CMS-HCC)    Closed burst fracture of lumbar vertebra with routine healing    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    Appropriateness of Therapy     Acute infections noted within Epic:  No active infections  Patient reported infection: None    Is medication and dose appropriate based on diagnosis and infection status? Yes    Prescription has been clinically reviewed: Yes      Baseline Quality of Life Assessment      How many days over the past month did your crohn's disease  keep you from your normal activities? For example, brushing your teeth or getting up in the morning. 0    Financial Information     Medication Assistance provided: Prior Authorization    Anticipated copay of $0 (Loading and maintenance doses) reviewed with patient. Verified delivery address.    Delivery Information     Scheduled delivery date: 12/13/2021    Expected start date: 12/13/2021    Medication will be delivered via Same Day Courier to the prescription address in [facility].  This shipment will not require a signature.      Explained the services we provide at [facility] and that each month we would call to set up refills.  Stressed importance of  returning phone calls so that we could ensure they receive their medications in time each month.  Informed patient that we should be setting up refills 7-10 days prior to when they will run out of medication.  A pharmacist will reach out to perform a clinical assessment periodically.  Informed patient that a welcome packet, containing information about our pharmacy and other support services, a Notice of Privacy Practices, and a drug information handout will be sent.      The patient or caregiver noted above participated in the development of this care plan and knows that they can request review of or adjustments to the care plan at any time.      Patient or caregiver verbalized understanding of the above information as well as how to contact the pharmacy at [phone] option 4 with any questions/concerns.  The pharmacy is open Monday through Friday 8:30am-4:30pm.  A pharmacist is available 24/7 via pager to answer any clinical questions they may have.    Patient Specific Needs     Does the patient have any physical, cognitive, or cultural barriers? No    Does the patient have adequate living arrangements? (i.e. the ability to store and take their medication appropriately) Yes    Did you identify any home environmental safety or security hazards? No    Patient prefers to have medications discussed with  Patient     Is the patient or caregiver able to read and understand education materials at a high school level or above? Yes    Patient's primary language is  English     Is the patient high risk? No    SOCIAL DETERMINANTS OF HEALTH     At the [facility], we have learned that life circumstances - like trouble affording food, housing, utilities, or transportation can affect the health of many of our patients.   That is why we wanted to ask: are you currently experiencing any life circumstances that are negatively impacting your health and/or quality of life? Patient declined to answer    Social Determinants of Health     Financial Resource Strain: Low Risk     Difficulty of Paying Living Expenses: Not hard at all Internet Connectivity: Not on file   Food Insecurity: No Food Insecurity    Worried About Programme researcher, broadcasting/film/video in the Last Year: Never true    Barista in the Last Year: Never true   Tobacco Use: Low Risk     Smoking Tobacco Use: Never    Smokeless Tobacco Use: Never    Passive Exposure: Not on file   Housing/Utilities: Low Risk     Within the past 12 months, have you ever stayed: outside, in a car, in a tent, in an overnight shelter, or temporarily in someone else's home (i.e. couch-surfing)?: No    Are you worried about losing your housing?: No    Within the past 12 months, have you been unable to get utilities (heat, electricity) when it was really needed?: No   Alcohol Use: Not on file   Transportation Needs: No Transportation Needs    Lack of Transportation (Medical): No    Lack of Transportation (Non-Medical): No   Substance Use: Not on file   Health Literacy: Low Risk     : Never   Physical Activity: Not on file   Interpersonal Safety: Not on file   Stress: Not on file   Intimate Partner Violence: Not on file   Depression: Not  at risk    PHQ-2 Score: 2   Social Connections: Not on file       Would you be willing to receive help with any of the needs that you have identified today? Not applicable       Mirian Capuchin, PharmD  [facility] Specialty Pharmacist

## 2021-11-29 NOTE — Unmapped (Signed)
Patient is requesting the following refill  Requested Prescriptions     Pending Prescriptions Disp Refills    tamsulosin (FLOMAX) 0.4 mg capsule [Pharmacy Med Name: TAMSULOSIN HCL 0.4 MG CAPSULE] 90 capsule 0     Sig: TAKE 1 CAPSULE BY MOUTH EVERY DAY AT NIGHT    oxybutynin (DITROPAN) 5 MG tablet [Pharmacy Med Name: OXYBUTYNIN 5 MG TABLET] 270 tablet 0     Sig: TAKE 1 TABLET BY MOUTH THREE TIMES A DAY.       Recent Visits  Date Type Provider Dept   08/23/21 Office Visit Claudean Kinds, MD [facility]   Showing recent visits within past 365 days with a meds authorizing provider and meeting all other requirements  Future Appointments  Date Type Provider Dept   08/25/22 Appointment Claudean Kinds, MD [facility]   Showing future appointments within next 365 days with a meds authorizing provider and meeting all other requirements       Labs: Not applicable this refill

## 2021-12-01 NOTE — Unmapped (Signed)
OUTPATIENT PHYSICAL THERAPY  Daily Note    Patient Name: Kerry Mooney  Date of Birth:17-Jun-1981  Date: 12/01/2021  Session Number: 7 (7/10 to reassessment)  Therapy Diagnosis:   Encounter Diagnoses   Name Primary?    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 [employer] 10/28/21 - 01/27/22  [facility] Financial Assistance (09/16/2021 - 03/20/2023)      Primary Therapist: Rhea Belton, PT, DPT    Precautions:  Open surgical wound on back, actively seeing [facility] plastic surgery for wound care.    SCI Level: C2 ASIA D SCI    COVID Mask use: Patient wore a mask for the entire therapy session. and Patient and/or caregiver opted not to wear mask for duration or majority of session per updated clinic mask policy.      Assessment:   Patient brought AFO today. Today's session focused on floor transfer, endurance building with Nustep, and ambulation without UE support .   Patient continues to progress in all area of strength, endurance and mobility.  Still limited by his failing posture, but working  toward stated goals with skilled intervention which will maximize functional capacity and decrease burden of care.       Communication/consultation with other professionals: POC discussed with therapy team.     Prior Objective Testing  Tests Eval (10/28/21) 11/26/21 11/29/21   TUG 15.9 seconds, RW   23.85 sec with no UE support    5x STS 28.6 seconds  20.91 sec    WISCI II Level 6 (walker, brace on R, Min A - SBA x 1) Level 12 - (two crutches, brace on right, no assist)    10 MWT  TBD  0.62 m/s with walker 0.47 m/s      Problem List: decreased UE and LE strength, impaired balance, decreased ROM, impaired posture, deconditioning, impaired functional mobility, impaired ADLs and edema    Plan     Next session:       Freq/Duration: per evaluation (10/28/21)  2x a week for 12 weeks     Home Exercise ID: [id number]    Goals:  Patient/Family Goals:I want to be strong enough to walk without the walker and make walking my primary means of getting around.     Short Term Goals:  In 6 weeks:  Patient will be able to properly demonstrate current HEP independently x1 in clinic to build upon functional gains in PT.  Met 11/29/21  Patient will ambulate 500 feet with RW, AFO donned on R LE, and decreased gait deviation to allow return to household/community ambulation.  Patient will ascend and descend 3 stairs with CGA assistance, with  1 or no handrails and safe technique demonstrating step through pattern in order to increase independence with household/community mobility.Met 11/29/21  Patient will be able to perform floor transfer with bilateral UE support and supervision assist.  Met 11/29/21  Patient will improve Timed Up and Go score to < 12 seconds to further reduce fall risk.  Patient will ambulate >1.0 meters/second to decrease fall risk and improve household/community ambulation  Patient will complete FGA to assess dynamic balance during gait.  Patient will complete .  Met 11/29/21  Long Term Goals:  12  weeks:  Patient will use ambulation with LRAD as primary means of mobility more than 60% of the time.   Patient will be able to ambulate 1,027ft with minimal gait deviations and AFO donned on R LE to return to community ambulation and demonstrate improved ambulation endurance.   Patient will ascend and descend 6 stairs with SBA assistance, with no handrails and safe technique demonstrating step through pattern in order to increase independence with household/community mobility.  Patient will be able to perform floor transfer with no external support and SBA.   -  Met  11/26/21  Patient will improve TUG score to < 10 seconds to further reduce falls risk.   Patient will achieve FGA score of 27/30 or greater to demonstrate improved dynamic balance during gait and reduce future risk for falls.    Subjective:   Pt states: i have been walking around more  HEP: has been keeping up with standing exercises  Pain: no complaints    Reason for Referral/History of Present Condition/Onset of injury/exacerbation:   41 y.o. male presents with PMH of C2 ASIA D SCI from 41ft fall through attic (04/11/2021), Patient has a PMH of seizures, ankylosing spondylitis, TBI and crohns disease. Per MD note, patient was admitted to [facility] from 10/18-10/24/2022, readmitted to [facility] on 05/03/21 for assessment of cervical wound due to drainage and concern for infection and readmitted to [facility] from 06/01/21 to 06/16/21 before requiring transfer back to acute for wound debridement/washout. Patient was readmitted again to [facility] from 06/25/21-07/18/21 before being transferred back to [facility] main for wound washout     Objective:     Pain  none -  Stiff and sore in shoulders and arms  and hips       10 MWT with no AD  0 .46 m/s     There Ex:  30 min   Nustep:   Total time:  14 min     Intervals   4 min Arm + Legs  Level 3  2 min Legs Level 3   2 A+ L  2  L   2 A + L   2 L       Supine there Ex     Prone lying attempted with pillow under stomach but patient states it is too uncomfortable staying on stomach for ex.   SLR   Bridge with ball squeeze and LAQ      Weight shifting for backward stepping - working on full knee ext/ hip ext to get step through pattern    Step ups with one rail x 10 reps     Steps with one rail  x 10       TA: 15 min     Floor transfers with Mod Ind and external support ,  Sit to stand form floor without support and     Weight Shifts  Turns   Backward stepping       TUG: 23.85 sec with no UE support   5 STS 20.91 sec     10 MWT:  Walker 0.609 m/s  Trekking [place]   0.22 m/s  No AD : 0.47 m/s       HEP:    Postural Exercises:   Shoulder ER with hands behind head  Horizontal Abd/Add  Shoulder Flexion: I and Y  TA - Alternating Heel slides,  TA - Alternating Hip Abd/ Add,    Supine bicycle legs  Education: Importance of stretching and ROM as it relates to strengthening.  Consistent use of AFO to reduce foot drop or foot slap.      Home Exercise Program:    - sit<>stand with UE extended, eccentric lowering emphasized   - at half wall for postural support:    - hip abd with glut med focus (2 UE support)    - hip flex (1 UE support)    - hip flex into extension (straight knee, 1 UE support)    [employer] [id number]  Added 11/24/21    Postural Exercises:   Shoulder ER with hands behind head  Horizontal Abd/Add  Shoulder Flexion: I and Y  TA - Alternating Heel slides,  TA - Alternating Hip Abd/ Add,    Supine bicycle legs      Treatment Rendered:    Total Treatment Time: 45 min   TA: 15 min   TE:30 min     I attest that I have reviewed the above information.  Signed: Vira Blanco, PT, DPT  12/01/2021 9:31 PM

## 2021-12-01 NOTE — Unmapped (Signed)
[facility] Plastic Surgery  [place] PA-C    HPI  Kerry Mooney is a 41 y.o. male who suffered a 10 ft fall 04/11/21 resulting in multiple spinal fractures & acute spinal cord injury s/p PLIF C2-4, L1-2 decompression laminectomy with reduction of fx T12-L3 PLA with pedicle screw fixation and local autographing on 04/12/21 at [facility], who then had a dehiscence of his incision. He underwent previous debridement and VAC placement by Neurosurgery on 05/20/21 followed by delayed closure by Plastics. His postoperative course was complicated by dehiscence, and he underwent debridement and VAC placement on 07/19/21. He presents for a wound check.     Interval History: Overall the patient is doing well. The patient has been applying dressing to the wound daily and keeping the area clean. He denied any purulent drainage, spreading erythema, edema, fevers chills or night sweats. The patient denied any concerns for infection. He is receiving PT/OT in [place] weekly and has been working on posture to avoid tension on wound.     Past Medical History:   Diagnosis Date   ??? Anemia    ??? Crohn's colitis (CMS-HCC)    ??? Spondylolysis     Ankylosing spondylitis       Patient Active Problem List   Diagnosis   ??? Dens fracture (CMS-HCC)   ??? Crohn's disease of small and large intestines with complication (CMS-HCC)   ??? Arthritis associated with inflammatory bowel disease   ??? Anal condyloma   ??? Wound dehiscence, surgical   ??? Fall   ??? Postoperative wound infection   ??? Pressure injury of skin   ??? Protein-calorie malnutrition, severe (CMS-HCC)   ??? Spondylolysis   ??? Crohn's colitis (CMS-HCC)   ??? Closed burst fracture of lumbar vertebra with routine healing   ??? 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 Routine health maintenance   ??? Seizure disorder (CMS-HCC)       Past Surgical History:   Procedure Laterality Date   ??? HERNIA REPAIR     ??? NECK SURGERY  2016    spinal fusion.has screw in his spine   ??? PR COLONOSCOPY W/BIOPSY SINGLE/MULTIPLE  09/15/2014    Procedure: COLONOSCOPY, FLEXIBLE, PROXIMAL TO SPLENIC FLEXURE; WITH BIOPSY, SINGLE OR MULTIPLE;  Surgeon: Zetta Bills, MD;  Location: [facility] PROCEDURES [facility];  Service: Gastroenterology   ??? PR COLONOSCOPY W/BIOPSY SINGLE/MULTIPLE N/A 10/06/2016    Procedure: COLONOSCOPY, FLEXIBLE, PROXIMAL TO SPLENIC FLEXURE; WITH BIOPSY, SINGLE OR MULTIPLE;  Surgeon: Zetta Bills, MD;  Location: [facility] PROCEDURES [facility];  Service: Gastroenterology   ??? PR COLONOSCOPY W/BIOPSY SINGLE/MULTIPLE N/A 01/04/2021    Procedure: COLONOSCOPY, FLEXIBLE, PROXIMAL TO SPLENIC FLEXURE; WITH BIOPSY, SINGLE OR MULTIPLE;  Surgeon: Zetta Bills, MD;  Location: [facility] PROCEDURES [facility];  Service: Gastroenterology   ??? PR COMPLEX DRAINAGE, WOUND Midline 05/20/2021    Procedure: INCISION & DRAINAGE, COMPLEX, POSTOPERATIVE WOUND INFECTION BACK/PRONE;  Surgeon: Seth Bake, MD;  Location: MAIN OR [facility];  Service: Neurosurgery   ??? PR DEBRIDEMENT, SKIN, SUB-Q TISSUE,=<20 SQ CM Midline 07/19/2021    Procedure: DEBRIDEMENT; SKIN & SUBCUTANEOUS TISSUE TRUNK;  Surgeon: Clarene Duke, MD;  Location: MAIN OR [facility];  Service: Plastics   ??? PR DEBRIDEMENT, SKIN, SUB-Q TISSUE,MUSCLE,=<20 SQ CM Midline 06/18/2021    Procedure: DEBRIDEMENT; SKIN, SUBCUTANEOUS TISSUE, & MUSCLE;  Surgeon: Clarene Duke, MD;  Location: MAIN OR [facility];  Service: Plastics   ??? PR IONM 1 ON 1 IN OR W/ATTENDANCE  EACH 15 MINUTES N/A 04/25/2014    Procedure: CONTINUOUS INTRAOPERATIVE NEUROPHYSIOLOGY MONITORING IN OR;  Surgeon: Everlean Alstrom, MD;  Location: MAIN OR [facility];  Service: Neurosurgery   ??? PR MUSCLE-SKIN FLAP,TRUNK Midline 05/24/2021    Procedure: MUSCLE, MYOCUTANEOUS, OR FASCIOCUTANEOUS FLAP; TRUNK;  Surgeon: Clarene Duke, MD;  Location: MAIN OR [facility];  Service: Plastics   ??? PR ODONTOID,FX,GRAFTING,OPEN Midline 04/25/2014    Procedure: OPEN TREATMENT &/OR REDCUCTION ODONTOID FX/DISLOCATION, ANTERIOR APPROACH, INTERNAL FIXATION, WITH GRAFT;  Surgeon: Everlean Alstrom, MD;  Location: MAIN OR [facility];  Service: Neurosurgery   ??? PR SURG DIAGNOSTIC EXAM, ANORECTAL N/A 01/21/2021    Procedure: ANORECTAL EXAM, SURGICAL, REQUIRING ANESTHESIA (GENERAL, SPINAL, OR EPIDURAL), DIAGNOSTIC;  Surgeon: Sharyn Lull, MD;  Location: MAIN OR [place];  Service: Gastrointestinal   ??? PR SURG EXCISION OF ANAL LESION(S) N/A 01/21/2021    Procedure: DESTRUCTION OF LESION(S), ANUS (EG, CONDYLOMA, PAPILLOMA, MOLLUSCUM CONTAGIOSUM) SIMPLE; SURGICAL EXCISION;  Surgeon: Sharyn Lull, MD;  Location: MAIN OR [place];  Service: Gastrointestinal   ??? TONSILLECTOMY           Current Outpatient Medications:   ???  azaTHIOprine (IMURAN) 50 mg tablet, TAKE 3 TABLETS BY MOUTH EVERY DAY, Disp: 270 tablet, Rfl: 0  ???  busPIRone (BUSPAR) 15 MG tablet, TAKE 1 TABLET (15 MG TOTAL) BY MOUTH THREE (3) TIMES A DAY., Disp: 270 tablet, Rfl: 3  ???  HUMIRA PEN CITRATE FREE 40 MG/0.4 ML, Inject the contents of 1 pen (40 mg total) under the skin every seven (7) days., Disp: 4 each, Rfl: 5  ???  HUMIRA PEN CITRATE FREE STARTER PACK FOR CROHN'S/UC/HS 3 X 80 MG/0.8 ML, Inject the contents of 2 pens (160 mg total) day 1, then inject the contents of 1 pen (80 mg total) on day 15 for loading dose., Disp: 3 each, Rfl: 0  ???  levETIRAcetam (KEPPRA) 1000 MG tablet, TAKE 1 TABLET BY MOUTH EVERY 12 HOURS, Disp: , Rfl:   ???  methocarbamoL (ROBAXIN) 500 MG tablet, Take 1 tablet (500 mg total) by mouth two (2) times a day as needed for pain., Disp: 90 tablet, Rfl: 1  ???  oxybutynin (DITROPAN) 5 MG tablet, Take 1 tablet (5 mg total) by mouth Three (3) times a day., Disp: 270 tablet, Rfl: 3  ???  tamsulosin (FLOMAX) 0.4 mg capsule, Take 1 capsule (0.4 mg total) by mouth daily., Disp: 90 capsule, Rfl: 3    Allergies   Allergen Reactions   ??? Infliximab      Seizure activity  Other reaction(s): Other (See Comments)  Seizures       Family History   Problem Relation Age of Onset   ??? Autoimmune disease Maternal Grandmother         Psoriasis   ??? Polycystic kidney disease Sister    ??? Anesthesia problems Neg Hx    ??? Ulcerative colitis Neg Hx    ??? Crohn's disease Neg Hx    ??? Colorectal Cancer Neg Hx    ??? Pancreatic cancer Neg Hx    ??? Esophageal cancer Neg Hx        Social History  Accompanied by father.    ROS  See HPI. All other systems reviewed are negative.    Physical Exam  BP 101/62 (BP Site: L Arm, BP Position: Sitting, BP Cuff Size: Medium)  - Pulse 71  - Temp 37.3 ??C (99.2 ??F) (Temporal)  - Resp 16  - Wt 66.7 kg (147 lb)  - SpO2  97%  - BMI 21.09 kg/m??      General Appearance: No acute distress.   Pulmonary: Normal respiratory effort.   Skin:  Back midline thoracic incision with 8 x 3cm area of superficial open wound with healthy appearing wound bed but with interval hypergranulation tissue present.No active infection identified today. Hypergranulation tissue treated with silver nitrate. Surrounding skin protected with vaseline and wound covered with sterile dressing     Assessment and Plan  Kerry Mooney is a 41 y.o. male who suffered a 10 ft fall 04/11/21 resulting in multiple spinal fractures & acute spinal cord injury s/p PLIF C2-4, L1-2 decompression laminectomy with reduction of fx T12-L3 PLA with pedicle screw fixation and local autographing on 04/12/21 at [facility], who then had a dehiscence of his incision. He underwent previous debridement and VAC placement by Neurosurgery on 05/20/21 followed by delayed closure by Plastics. His postoperative course was complicated by dehiscence, and he underwent debridement and VAC placement on 07/19/21.  - Overall patient doing well with interval improvement in wound healing  - I discussed the patient's care with Dr. Raphael Gibney (gastroenterology) due to his history of Crohns and the ability to restart his therapy. Due to stable appearance in wound it was discussed that he may restart his Crohn's medication due to the fact that he is symptomatic from a GI standpoint knowing that the medical can delay wound healing and place him in immunocompromised state. The risks of restarting the medication were discussed with Dr. Raphael Gibney and the patient and they will closely monitor wound for any changes if medication is restarted.   -I would like the patient to continue offloading pressure on the wound with position changes.  The patient is not to lay in a supine position for the sitting against a firm chair for an extended period of time.  Patient should be laying in a lateral position at night when sleeping  -The patient may shower daily.  After showering he can lightly dab the wound to dry.  The patient may allow his skin to air dry and allow for increased air exposure during the day due to the moisture present today.    - Continue to cover wound with island dressing to minimize tension on wound from spinal kyphosis. Updated Prism form submitting today  -If the wound continues to demonstrate interval healing and there are no concerns I like to see the patient back in clinic in 2 weeks due to treatment of hypergranulation tissue today   -Photographs were taken and placed in the patient's chart for comparison  -The patient was in agreement with the plan and all questions were addressed to their satisfaction today.      Henna Derderian Isaac Bliss, MPAS  [facility]

## 2021-12-02 ENCOUNTER — Ambulatory Visit: Admit: 2021-12-02 | Discharge: 2021-12-03 | Payer: PRIVATE HEALTH INSURANCE

## 2021-12-02 DIAGNOSIS — S21209S Unspecified open wound of unspecified back wall of thorax without penetration into thoracic cavity, sequela: Principal | ICD-10-CM

## 2021-12-08 NOTE — Unmapped (Unsigned)
OUTPATIENT OCCUPATIONAL THERAPY   Note Type: Treatment Note       Patient Name: Kerry Mooney  Date of Birth:May 12, 1981  Date of Visit: 12/08/2021  Visit #:  6/10 towards re-assessment, 6 total.  Date of Injury/Onset: Dec 2022  Date of Evaluation: 10/21/21  Referring Physician: Caren Griffins*  Reason for Referral: Eval and Treat  Plan of Care: 10/21/21-01/20/22  Encounter Diagnoses   Name Primary?    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 year old R handed male with history of C2 ASIA D SCI in Dec 2022 who presents with kyphotic posture, resulting in decreased shoulder ROM and pain with movement, and poor FMC, impairing participation in ADLs and IADLs.  This session, ***.  Patient requires skilled outpatient Occupational Therapy services to address the above deficits and maximize independence with ADLs and IADLs in home environment.        Complexity:  Moderate Complexity Eval Code: Clinical decision making was of moderate complexity, as data from the client???s history, profile, detailed assessments, and consideration of several treatment options.  Patient presents with comorbidities that affect occupational performance.  Minimal to moderate modification of tasks is necessary.        Long Term Goals:   1. In 9 sessions, patient will perform upgraded home exercise program independently  to max IND with ADLs and IADLs.  2. In 9 sessions, patient will demonstrate subjective improvement in writing quality from 55% to 70%  3. In 9 sessions, patient will demonstrate 1 adapted strategy to assist with clothing management (shirt)  4. In 9 sessions, patient will demonstrate no reports of pain while placing an item at/above shoulder height to max participation in household chores  5. In 9 sessions, patient will participate in simulated driving screen   6. In 9 sessions, patient will demonstrate improved L coordination with 9hpt to place all pegs in board in 60s or less     Patient in agreement with plan of care?: yes    Prognosis for goal achievement: Good due to good motivation, supportive family, and overall health status.    PLAN  Pt. will participate in:  Therapeutic Exercise  Therapeutic Acitivity  Manual Therapy  Neuromuscular Re-education  Orthotic management  Self-care home training    Next session: scapular stretches/exercises, shirt, reaching, tshirt    Planned frequency and duration of treatment:  1x/week 8 weeks.  Plan will be adjusted as needed.     SUBJECTIVE:  Last drove before his fall in October    History of Present Condition: Per referring physician's note:  History of Present Illness: Patient is a 41 y.o. year old male seen following his AIR stay in 06/2021 and 07/2021. He was admitted initially to [facility] following a fall 10 FT through a ceiling landing on exercise equipment. 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 PLIF C2-4, l1-2 decompression laminectomy with reduction of fx T12-L3 PLA with pedicle screw fixation and local autographing. PMH: seizures, ankylosing spondylitis, TBI and crohns disease. Patient was admitted to [facility] from 10/18-10/24/2022, readmitted to [facility] on 05/03/21 for assessment of cervical wound due to drainage and concern for infection and readmitted to [facility] AIR from 06/01/21 to 06/16/21 before requiring transfer back to acute for wound debridement/ washout.Patient was readmitted again to [facility] AIR from 06/25/21-07/18/21  before being transferred back to [facility] main for wound washout.    At the time of his discharge, patient had improved bladder sensation and was performing timed toileting.  He was discharged on oxybutynin 5 mg 3 times daily as well as Flomax 0.4 mg nightly.  He had also had improved sensation and control with his bowels and was no longer on a formalized bowel program.  He was contact-guard assist with his transfers using a rolling walker and mod I to set up with most ADLs.  He was still primarily using a power wheelchair for mobility.     SCI Injury/level: C2 ASIA D  Interval History:   Since discharge from inpatient, patient has been seen by the infectious disease clinic and has completed all necessary antibiotics.  He is also followed closely by plastic surgery in clinic.  Based on their last note, surgical site continues to heal well.  1. Bowel: Patient previously had been on Metamucil daily, MiraLAX and 2 tabs of senna at noon, Colace twice daily, and Enemeez nightly.  Since return to home, patient has been seen by his GI physician and currently takes Metamucil as needed, no longer takes MiraLAX or Colace, takes 1 tab of senna daily, and no longer requires Enemeez for bowel movements.  States that he essentially has a bowel movement every morning and denies having any bowel accidents.  Patient states that his sensation for his bowel movements has continued to improve and he now can feel it coming.  2. Bladder: Patient continues to take the oxybutynin 5 mg 3 times daily as well as Flomax 0.4 nightly.  States that he has regular, controlled urinary voids.  Denies any leaking or accidents.  Is interested in possibly cutting down on his bladder medications.  3. Spasticity: Patient continues to have no issues with spasticity at this time.  4. Skin: Patient has no skin breakdown anywhere other than his initial surgical wound.  Wound was visualized today and has healed well via secondary intention.  Still a few small areas the proximal end of the incision that have not entirely healed.  5. Neuropathic pain: Patient denies any issues with neuropathic pain.  6. Equipment: Patient uses his power wheelchair although is seen in a transport chair in clinic today.  Patient has a tub bench that he uses regularly.  Also has a rolling walker that he uses for transfers as well as some limited walking, primarily with therapy.  7. Therapy: Patient is completed home health therapy and is currently transitioning to outpatient PT and OT here at the [facility].    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[employer], [employer] and office furniture    OBJECTIVE  Self Care  Previously drove Data processing manager    Simulations completed: warm up drive, brake reaction time test, divided attention task, risky situation 1, risky situation 2    Braking and Safety Distances    Reaction time to applying the brake at a STOP signal:   Trial 1: 1.20s; Trial  2: 1.00s; Trial 3: 0.97s ; Trial 4: 0.77s; Trial 5: 0.93s- ideal value: <=0.7s- R foot only    Reaction time to applying the brake at a STOP signal:   Trial 1: 0.53s; Trial 2: 0.47s; Trial 3: 0.63s ; Trial 4: 0.67s; Trial 5: 0.47s- ideal value: <=0.7s- B feet      Braking to avoid a hazard:   Collisions:  [place] 2: did not pull over, did not stop for children crossing street, ran yellow lights, hit car, didn't hit dogs   [place] 2: stopped for all hazards      Comments: improved over course of practicing       HEP  Supine on wedge and 2 pillows  Horizontal abduction stretch 90sec x5  Overhead abduction x10- snow angel to point of shoulder height  Protraction/retraction  Goal post ER stretch  [place] ER stretch          Total Treatment Time:  40 minutes    Education:  Topics:Disease Glass blower/designer Exercise  Therapist, nutritional  Education Provided to: patient and family  Education Type:Demonstration and Explanation  Response to education/teachback:Verbal understanding recieved    I reviewed the no-show/attendance policy with the patient and caregiver(s). The family is aware that they must call to cancel appointments more than 24 hours in advance. They are also aware that if they late cancel or no-show three times, we reserve the right to cancel their remaining appointments. This policy is in place to allow [place] to best serve the needs of our caseload.       I attest that I have reviewed the above information.  Signed: Earlie Counts, OT 12/08/2021 10:44 AM

## 2021-12-08 NOTE — Unmapped (Signed)
OUTPATIENT PHYSICAL THERAPY  Daily Note    Patient Name: Kerry Mooney  Date of Birth:May 27, 1981  Date: 12/08/2021  Session Number: 8(8/10 to reassessment)  Therapy Diagnosis:   Encounter Diagnoses   Name Primary?    Spondylolysis Yes    Incomplete spinal cord injury at C1-C4 level without bone injury (CMS-HCC)     Impaired mobility and endurance          Referring Pracitioner: Sena Hitch Al*   Occurrence Codes:  Onset of Injury: April 15, 2021  Date of Evaluation: 10/28/2021  Date Treatment Began: 11/01/21  Certification Dates: [facility] 10/28/21 - 01/27/22  [facility] Financial Assistance (09/16/2021 - 03/20/2023)      Primary Therapist: Rhea Belton, PT, DPT    Precautions:  Open surgical wound on back, actively seeing [facility] plastic surgery for wound care.    SCI Level: C2 ASIA D SCI    COVID Mask use: Patient wore a mask for the entire therapy session. and Patient and/or caregiver opted not to wear mask for duration or majority of session per updated clinic mask policy.      Assessment:   Today's session focused on building endurance and work on strengthening exercises specific to right hip and knee in hopes of reducing the limp he exhibits when walking without a device.  Patient continues to progress in all area of strength, endurance and mobility.      Communication/consultation with other professionals: POC discussed with therapy team.     Prior Objective Testing  Tests Eval (10/28/21) 11/26/21 11/29/21   TUG 15.9 seconds, RW   23.85 sec with no UE support    5x STS 28.6 seconds  20.91 sec    WISCI II Level 6 (walker, brace on R, Min A - SBA x 1) Level 12 - (two crutches, brace on right, no assist)    10 MWT  TBD  0.62 m/s with walker 0.47 m/s      Problem List: decreased UE and LE strength, impaired balance, decreased ROM, impaired posture, deconditioning, impaired functional mobility, impaired ADLs and edema    Plan     Next session:       Freq/Duration: per evaluation (10/28/21)  2x a week for 12 weeks     Home Exercise ID: [id number]    Goals:  Patient/Family Goals:I want to be strong enough to walk without the walker and make walking my primary means of getting around.     Short Term Goals:  In 6 weeks:  Patient will be able to properly demonstrate current HEP independently x1 in clinic to build upon functional gains in PT.  Met 11/29/21  Patient will ambulate 500 feet with RW, AFO donned on R LE, and decreased gait deviation to allow return to household/community ambulation.  Patient will ascend and descend 3 stairs with CGA assistance, with  1 or no handrails and safe technique demonstrating step through pattern in order to increase independence with household/community mobility.Met 11/29/21  Patient will be able to perform floor transfer with bilateral UE support and supervision assist.  Met 11/29/21  Patient will improve Timed Up and Go score to < 12 seconds to further reduce fall risk.  Patient will ambulate >1.0 meters/second to decrease fall risk and improve household/community ambulation  Patient will complete FGA to assess dynamic balance during gait.  Patient will complete .  Met 11/29/21      Long Term Goals:  12  weeks:  Patient will use ambulation with LRAD as primary means  of mobility more than 60% of the time.   Patient will be able to ambulate 1,044ft with minimal gait deviations and AFO donned on R LE to return to community ambulation and demonstrate improved ambulation endurance.   Patient will ascend and descend 6 stairs with SBA assistance, with no handrails and safe technique demonstrating step through pattern in order to increase independence with household/community mobility.  Patient will be able to perform floor transfer with no external support and SBA.   -  Met  11/26/21  Patient will improve TUG score to < 10 seconds to further reduce falls risk.   Patient will achieve FGA score of 27/30 or greater to demonstrate improved dynamic balance during gait and reduce future risk for falls.    Subjective:   Pt states:  I am able to do a whole lot more now.  HEP: has been keeping up with standing exercises  Pain: no complaints    Reason for Referral/History of Present Condition/Onset of injury/exacerbation:   41 y.o. male presents with PMH of C2 ASIA D SCI from 36ft fall through attic (04/11/2021), Patient has a PMH of seizures, ankylosing spondylitis, TBI and crohns disease. Per MD note, patient was admitted to [facility] from 10/18-10/24/2022, readmitted to [facility] on 05/03/21 for assessment of cervical wound due to drainage and concern for infection and readmitted to [facility] from 06/01/21 to 06/16/21 before requiring transfer back to acute for wound debridement/washout. Patient was readmitted again to [facility] from 06/25/21-07/18/21 before being transferred back to [facility] main for wound washout     Objective:     Pain  none -      There Ex:  45  min   Nustep:   Total time:  15  min LE only Level 4 Seat 9  RPE 6 1070 steps      There Ex for strengthening:        Wall squats x 10   Ham curls x 10     LAQ  with back supported x 10       SLR x10    Hip Abd  x 10     Bridge  with squeeze x10     Hip Add squeeze with LAQ  2 x 5    Hip IR  x 10       TUG: 23.85 sec with no UE support   5 STS 20.91 sec     10 MWT:  Walker 0.609 m/s  Trekking Poles   0.22 m/s  No AD : 0.47 m/s       HEP:    Postural Exercises:   Shoulder ER with hands behind head  Horizontal Abd/Add  Shoulder Flexion: I and Y  TA - Alternating Heel slides,  TA - Alternating Hip Abd/ Add,    Supine bicycle legs    Education: Importance of stretching and ROM as it relates to strengthening.  Consistent use of AFO to reduce foot drop or foot slap.      Home Exercise Program:    - sit<>stand with UE extended, eccentric lowering emphasized   - at half wall for postural support:    - hip abd with glut med focus (2 UE support)    - hip flex (1 UE support)    - hip flex into extension (straight knee, 1 UE support)    [employer] [id number]  Added 11/24/21    Postural Exercises:   Shoulder ER with hands behind head  Horizontal Abd/Add  Shoulder Flexion: I and Y  TA - Alternating Heel slides,  TA - Alternating Hip Abd/ Add,    Supine bicycle legs      Treatment Rendered:    Total Treatment Time: 45 min      TE:45 min     I attest that I have reviewed the above information.  Signed: Vira Blanco, PT, DPT  12/08/2021 9:34 AM

## 2021-12-10 NOTE — Unmapped (Signed)
OUTPATIENT PHYSICAL THERAPY  Daily Note    Patient Name: Kerry Mooney  Date of Birth:01/08/1981  Date: 12/10/2021  Session Number: 9(9/10 to reassessment)  Therapy Diagnosis:   Encounter Diagnoses   Name Primary?    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    SCI Level: C2 ASIA D SCI    COVID Mask use: Patient wore a mask for the entire therapy session. and Patient and/or caregiver opted not to wear mask for duration or majority of session per updated clinic mask policy.      Assessment:   Today's session focused on building endurance and work on strengthening exercises specific to right hip and knee in hopes of reducing the limp he exhibits when walking without a device.  Patient continues to progress in all area of strength, endurance and mobility.      Communication/consultation with other professionals: POC discussed with therapy team.     Prior Objective Testing  Tests Eval (10/28/21) 11/26/21 11/29/21   TUG 15.9 seconds, RW   23.85 sec with no UE support    5x STS 28.6 seconds  20.91 sec    WISCI II Level 6 (walker, brace on R, Min A - SBA x 1) Level 12 - (two crutches, brace on right, no assist)    10 MWT  TBD  0.62 m/s with walker 0.47 m/s      Problem List: decreased UE and LE strength, impaired balance, decreased ROM, impaired posture, deconditioning, impaired functional mobility, impaired ADLs and edema    Plan     Next session:   Reassessment Due .Continue LE strengthening -  Trial Ex with hook wall Freq/Duration: per evaluation (10/28/21)  2x a week for 12 weeks     Home Exercise ID: [id number]    Goals:  Patient/Family Goals:I want to be strong enough to walk without the walker and make walking my primary means of getting around.     Short Term Goals:  In 6 weeks:  Patient will be able to properly demonstrate current HEP independently x1 in clinic to build upon functional gains in PT.  Met 11/29/21  Patient will ambulate 500 feet with RW, AFO donned on R LE, and decreased gait deviation to allow return to household/community ambulation.  Patient will ascend and descend 3 stairs with CGA assistance, with  1 or no handrails and safe technique demonstrating step through pattern in order to increase independence with household/community mobility.Met 11/29/21  Patient will be able to perform floor transfer with bilateral UE support and supervision assist.  Met 11/29/21  Patient will improve Timed Up and Go score to < 12 seconds to further reduce fall risk.  Patient will ambulate >1.0 meters/second to decrease fall risk and improve household/community ambulation  Patient will complete FGA to assess dynamic balance during gait.  Patient will complete .  Met 11/29/21      Long Term Goals:  12  weeks:  Patient will use ambulation with LRAD as primary means of mobility more than 60% of the time.   Patient will be able to ambulate 1,03ft with minimal gait deviations and AFO donned on R LE to return to community ambulation and demonstrate improved ambulation endurance.   Patient will ascend and descend 6 stairs with SBA assistance, with no handrails and safe technique demonstrating step through pattern in order to increase independence with household/community mobility.  Patient will be able to perform floor transfer with no external support and SBA.   -  Met  11/26/21  Patient will improve TUG score to < 10 seconds to further reduce falls risk. Today???s assessment is for specific pre or post testing only per MD request. Full PT evaluation and Skilled Physical Therapy treatment intervention have not been requested.          NMR15 min    NMES to Right Ant Tib for Ankle Pump x 20 reps. With Estim at  40 m Amp       There Act 15 min   Up and down 4 inch and 6 inch curb with no AD and CGA for balance. Patient exhibit knee hyperextension with larger curb struggle with stability when stepping down with left foot first      There Ex15 min    Gastroc Stretch   Right Ankle DF 2 x 10 reps      Supine posture Exercises      SLR x 20    Hip Abd  x 10      Bridge  with  ball squeeze x10     Hip Add squeeze with LAQ   x 10       TUG: 23.85 sec with no UE support   5 STS 20.91 sec     10 MWT:  Walker 0.609 m/s  Trekking Poles   0.22 m/s  No AD : 0.47 m/s       HEP:    Postural Exercises:   Shoulder ER with hands behind head  Horizontal Abd/Add  Shoulder Flexion: I and Y  TA - Alternating Heel slides,  TA - Alternating Hip Abd/ Add,    Supine bicycle legs    Education: Importance of stretching and ROM as it relates to strengthening.  Consistent use of AFO to reduce foot drop or foot slap.      Home Exercise Program:    - sit<>stand with UE extended, eccentric lowering emphasized   - at half wall for postural support:    - hip abd with glut med focus (2 UE support)    - hip flex (1 UE support)    - hip flex into extension (straight knee, 1 UE support)    [employer] [id number]  Added 11/24/21    Postural Exercises:   Shoulder ER with hands behind head  Horizontal Abd/Add  Shoulder Flexion: I and Y  TA - Alternating Heel slides,  TA - Alternating Hip Abd/ Add,    Supine bicycle legs      Treatment Rendered:    Total Treatment Time: 45 min      TE:15 min   NMR 15 min  TA 15 min     I attest that I have reviewed the above information.  Signed: Vira Blanco, PT, DPT  12/10/2021 3:03 PM

## 2021-12-12 MED ORDER — METHOCARBAMOL 500 MG TABLET
ORAL_TABLET | Freq: Two times a day (BID) | ORAL | 1 refills | 0 days | PRN
Start: 2021-12-12 — End: ?

## 2021-12-13 MED ORDER — METHOCARBAMOL 500 MG TABLET
ORAL_TABLET | Freq: Two times a day (BID) | ORAL | 1 refills | 45 days | Status: CP | PRN
Start: 2021-12-13 — End: 2022-12-13

## 2021-12-13 NOTE — Unmapped (Signed)
Patient is requesting the following refill  Requested Prescriptions     Pending Prescriptions Disp Refills    methocarbamoL (ROBAXIN) 500 MG tablet [Pharmacy Med Name: METHOCARBAMOL 500 MG TABLET] 90 tablet 1     Sig: TAKE 1 TABLET (500 MG TOTAL) BY MOUTH TWO (2) TIMES A DAY AS NEEDED FOR PAIN.       Recent Visits  Date Type Provider Dept   08/23/21 Office Visit Claudean Kinds, MD [facility]   Showing recent visits within past 365 days with a meds authorizing provider and meeting all other requirements  Future Appointments  Date Type Provider Dept   08/25/22 Appointment Claudean Kinds, MD [facility]   Showing future appointments within next 365 days with a meds authorizing provider and meeting all other requirements       Labs: Not applicable this refill

## 2021-12-13 NOTE — Unmapped (Signed)
OUTPATIENT PHYSICAL THERAPY  REASSESSMENT     Patient Name: Kerry Mooney  Date of Birth:Nov 12, 1980  Date: 12/13/2021  Session Number: 10(10/10 to reassessment)  Therapy Diagnosis:   Encounter Diagnoses   Name Primary?    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    SCI Level: C2 ASIA D SCI    COVID Mask use: Patient wore a mask for the entire therapy session. and Patient and/or caregiver opted not to wear mask for duration or majority of session per updated clinic mask policy.      Assessment:   Today's session focused on reassessment of progress toward the initially set goals.  Patient is making progress in all areas.  Continues to benefit from use of an AD, LC vs Trekking poles to reduce compensatory gait strategy.  Patient will continue to benefit from PT to maximize functional independence and resumption of pre-existing life roles.          Communication/consultation with other professionals: POC discussed with therapy team.     Prior Objective Testing  Tests Eval (10/28/21) 11/26/21 11/29/21 Reassessment  12/13/21    TUG 15.9 seconds, RW   23.85 sec with no UE support  21.6 sec ( no AD)    5x STS 28.6 seconds  20.91 sec  15.04 sec    WISCI II Level 6 (walker, brace on R, Min A - SBA x 1) Level 12 - (two crutches, brace on right, no assist)  Level 15 - Brace - Once Crutch , No Assistance > 10 M   10 MWT  TBD  0.62 m/s with walker 0.47 m/s 10 m/ 15.86 sec     0.63 m/s without the walker      Problem List: decreased UE and LE strength, impaired balance, decreased ROM, impaired posture, deconditioning, impaired functional mobility, impaired ADLs and edema    Plan     Next session:   Continue LE strengthening -  Trial Ex with hook wall     Freq/Duration: per evaluation (10/28/21)  2x a week for 12 weeks     Home Exercise ID: [id number]    Goals:  Patient/Family Goals:I want to be strong enough to walk without the walker and make walking my primary means of getting around.     Short Term Goals:  In 6 weeks:  Patient will be able to properly demonstrate current HEP independently x1 in clinic to build upon functional gains in PT.  Met 11/29/21  Patient will ambulate 500 feet with RW, AFO donned on R LE, and decreased gait deviation to allow return to household/community ambulation. Progressing 250 feet with LC and AFOS  Patient will ascend and descend 3 stairs with CGA assistance, with  1 or no handrails and safe technique demonstrating step through pattern in order to increase independence with household/community mobility.Met 11/29/21  Patient will be able to perform floor transfer with bilateral UE support and supervision assist.  Met 11/29/21  Patient will improve Timed Up and Go score to < 12 seconds to further reduce fall risk. Not Met 12/13/21  Patient will ambulate >1.0 meters/second to decrease fall risk and improve household/community ambulation Not Met 12/13/21  Patient will complete FGA to assess dynamic balance during gait.  TBA  Patient will complete .  Met 11/29/21      Long Term Goals:  12  weeks:  Patient will use ambulation with LRAD as primary means of mobility more than 60% of the time.   Patient will be able to ambulate 1,068ft with minimal gait deviations and AFO donned on R LE to return to community ambulation and demonstrate improved ambulation endurance.   Patient will ascend and descend 6 stairs with SBA assistance, with no handrails and safe technique demonstrating step through pattern in order to increase independence with household/community mobility.  Patient will be able to perform floor transfer with no external support and SBA.   -  Met  11/26/21  Patient will improve TUG score to < 10 seconds to further reduce falls risk. Today???s assessment is for specific pre or post testing only per MD request. Full PT evaluation and Skilled Physical Therapy treatment intervention have not been requested.        Pain :  2/10 legs tender   Falls - none since going home.      Patient reports that he is now able to do the following with Ind:   I do the laundry and I am able to hang up my clothes. I can now tie my shoes and put on my clothes.      TA:  25 min   Up and down 6 steps with one rail and reciprocal pattern  and  CGA     Floor Transfers with Ind and no UE support    Ambulation 250 feet with  one LC and CGA     See outcome measures above.     PPTM:20 min     TUG: 21.6 sec with no UE support   5 STS 15.05  10 M WT : 0.63 m/s           HEP  Gastroc Stretch   Right Ankle DF 2 x 10 reps      Supine posture Exercises      SLR x 20    Hip Abd  x 10      Bridge  with  ball squeeze x10     Hip Add squeeze with LAQ   x 10       Postural Exercises:   Shoulder ER with hands behind head  Horizontal Abd/Add  Shoulder Flexion: I and Y  TA - Alternating Heel slides,  TA - Alternating Hip Abd/ Add,    Supine bicycle legs    Education: Importance of stretching and ROM as it relates to strengthening.  Consistent use of AFO to reduce foot drop or foot slap.      Home Exercise Program:    - sit<>stand with UE extended, eccentric lowering emphasized   - at half wall for postural support:    - hip abd with glut med focus (2 UE support)    - hip flex (1 UE support)    - hip flex into extension (straight knee, 1 UE support)    [employer] [id number]  Added 11/24/21    Postural Exercises:   Shoulder ER with hands behind head  Horizontal Abd/Add  Shoulder Flexion: I and Y  TA - Alternating Heel slides,  TA - Alternating Hip Abd/ Add,    Supine bicycle legs      Treatment Rendered:    Total Treatment Time: 45 min   PPTM 20 min   TA 25 min     I attest that I have reviewed the above information.  Signed: Vira Blanco, PT, DPT  12/13/2021 1:07 PM

## 2021-12-14 ENCOUNTER — Ambulatory Visit: Admit: 2021-12-14 | Discharge: 2021-12-15 | Payer: PRIVATE HEALTH INSURANCE

## 2021-12-14 DIAGNOSIS — G40909 Epilepsy, unspecified, not intractable, without status epilepticus: Principal | ICD-10-CM

## 2021-12-14 DIAGNOSIS — Z00121 Encounter for routine child health examination with abnormal findings: Principal | ICD-10-CM

## 2021-12-14 MED ORDER — MIDAZOLAM 5 MG/SPRAY (0.1 ML) NASAL SPRAY
4 refills | 0 days | Status: CP
Start: 2021-12-14 — End: ?

## 2021-12-14 MED ORDER — LEVETIRACETAM 1,000 MG TABLET
ORAL_TABLET | Freq: Two times a day (BID) | ORAL | 3 refills | 90 days | Status: CP
Start: 2021-12-14 — End: ?

## 2021-12-14 NOTE — Unmapped (Addendum)
1. Continue Keppra 1000 mg BID   2. Nayzilam Give 1 spray (5mg ) into 1 nostril. If no response in 10 min, may give second spray in other nostril. Do not give second dose if patient has trouble breathing or excessive sedation. Max 2 doses/seizure episode, 1 episode every 3 days or 5 episodes/month.  3. Obtain .1 hour EEG   4. Levels of medication.   5. Please keep track of the getting stuck episodes if any occur.   6. I  recommended seizure precautions with regards to avoiding unsupervised water recreational activity, climbing or working at heights, operation of heavy or dangerous machinery, caution around fire and sources of high heat, as well as any other activity which could put you at danger in case of a seizure. Taking a bath is generally not recommended for a patient with uncontrolled epilepsy. I also reviewed the [place] DMV law and recommended to not drive unless approved by the [facility].  7. Follow up in 4-6 months.     Seizures and Epilepsy    What is a seizure?  A brief, strong surge of abnormal electrical activity affects part or all of the brain, which leads to transient symptoms and signs from convulsions and loss of consciousness to more subtle symptoms such as blank staring, lip smacking, or jerking movements of arms and legs. It can occur due to provoking factors, such as fever, infection, alcohol, drugs, certain medications, or other medical conditions.    What is Epilepsy?  Chronic/two or more recurrent unprovoked seizures from an underlying neurologic condition.    First Aid for Seizures  When providing seizure first aid for generalized tonic clonic (grand mal) seizures, these are the key things to remember:  Keep calm and reassure other people who may be nearby.   Don't hold the person down or try to stop his movements.   Time the seizure with your watch.   Clear the area around the person of anything hard or sharp.   Loosen ties or anything around the neck that may make breathing difficult.   Put something flat and soft, like a folded jacket, under the head.   Turn him or her gently onto one side. This will help keep the airway clear.   Do not try to force the mouth open with any hard implement or with fingers. A person having a seizure CANNOT swallow his tongue. Efforts to hold the tongue down can injure teeth or jaw.   Don't attempt artificial respiration except in the unlikely event that a person does not start breathing again after the seizure has stopped.   Stay with the person until the seizure ends naturally.   Be friendly and reassuring as consciousness returns.   Offer to call a taxi, friend or relative to help the person get home if he seems confused or unable to get home by himself.     Is an Emergency Room Visit Needed?  An un-complicated generalized tonic clonic (grand mal) seizure in someone who has epilepsy is not a medical emergency, even though it looks like one. It stops naturally after a few minutes without ill effects. The average person is able to continue about his business after a rest period, and may need only limited assistance, or no assistance at all, in getting home.  In other circumstances, an ambulance should be called. Also, when these conditions exist, immediate medical attention is necessary: diabetes, brain infections, heat exhaustion, pregnancy, poisoning, hypoglycemia, high fever, head injury, etc.  No Need to Call an Ambulance if --  medical I.D. jewelry or card says epilepsy, and   the seizure ends in under five minutes, and   consciousness returns without further incident, and   there are no signs of injury, physical distress, or pregnancy.     An Ambulance Should Be Called if --  the seizure has happened in water.   there's no medical I.D., and no way of knowing whether the seizure is caused by epilepsy.   the person is pregnant, injured, or diabetic.   the seizure continues for more than five minutes.   a second seizure starts shortly after the first has ended. consciousness does not start to return after the shaking has stopped.   the person has been injured as a result of the seizure     If the ambulance arrives after consciousness has returned, the person should be asked whether the seizure was associated with epilepsy and whether emergency room care is wanted.    Department of Motor Vehicle [facility]) of [place] regulations for seizures - It is the patient???s responsibility to report the incidence of the seizure in the state of [place]. [place] has no statutory provision requiring physicians to report patients diagnosed with epilepsy or seizures to a central state agency.    The recommended requirement for a driver???s license in [place] for an individual with epilepsy is that they be seizure-free for 6-12 months. However, the DMV may consider certain exceptions to this general rule. The Department learns of an individual's condition by inquiring on the application form or renewal form, a physician's report to the [facility], an accident report or from correspondence from the individual. The person may be required to submit a Medical Report Form either annually or semi-annually.    A person whose license has been denied for medical reasons may request a hearing in writing within 10 days of the notice of cancellation. The licensee may retain his or her license during the hearing process until advised otherwise by the Review Board.    Epilepsy Website for More Information     http://www.epilepsyfoundation.org

## 2021-12-14 NOTE — Unmapped (Signed)
[address]  [place], [place] [address]   Phone: [phone]  Referral Fax: [phone]     Date December 14, 2021  Date Last seen:   Date first seen:   Name Kerry Mooney  MRN [id number]  Primary care provider Maree Erie, MD  Referring Provider Claudean Kinds    Assessment:   This is the first [place] [facility] outpatient visit for EDIS HUISH is a 41 y.o. right handed man  a patient of Dr. Criselda Peaches who comes to establish care for his epilepsy.   The patient had a history of childhood seizures was on phenobarbital from 19 months for age using it for 2 years. He has not further seizures until March 2016.  He reports hitting his head prior to seizure recurrence in October 2015. He then progressed to having about 3-4 more seizures. Seizure sound epileptic. No EEG data although reports having a brain MRI in the past with no issues. He is currently taking Keppra 1000 mg BID changed in January 2023 following a seizure-like events with no further seizures. He will obtain a baseline EEG and levels as well as continue current medications. He will track any seizure like events and let me know should there be concerns.   In addition, while the patient reports having a follow up MRI after his seizures, I only see one in 2016 and other cervical or lumbar MRI. I would like to obtain a follow up study due to his seizures as well as the neuro examination findings.     Diagnosis:   Epilepsy, NOS  Abnormal clinical exam    Plan:   1. Continue Keppra 1000 mg BID   2.1 hour EEG   3. Levels of medication.   4. Please keep track of the getting stuck episodes if any occur.   5. I recommended seizure precautions with regards to avoiding unsupervised water recreational activity, climbing or working at heights, operation of heavy or dangerous machinery, caution around fire and sources of high heat, as well as any other activity which could put you at danger in case of a seizure. Taking a bath is generally not recommended for a patient with uncontrolled epilepsy. I also reviewed the [place] DMV law and recommended to not drive unless approved by the [facility]  6. Obtain follow up brain MRI.   7. Follow up in 4-6 months.       We discussed my impressions regarding the patient???s findings, including diagnosis, test results, and implications on future health, effects and side effects of present and future potential medications, as well as further testing and medications required. Issues discussed also included safety factors related to driving, swimming and bathing, heights, machinery, burns. We discussed interactions between seizure medications and birth control pills, relationships between seizure medications and mood disorders.I provided educational material on epilepsy.  The patient is aware to contact my office for problems pertaining to his epilepsy      Chief Complaint    Child hood febrile seizures from 49 months of age, that might have been complex as he was on phenobarbital for 2 years. No seizures after that       In 04/2014, patient did have C2 dens fracture after slipping down the stairs at home when preparing for work.    On 10/06/2014: The  patient has chron's disease and while at [facility] for first dose of Remicade, he was noted to be unresponsive,  undergoing grand mal seizure (unclear duration, likely <5 min), per notes. The patient had vomited on himself and was incontinent of urine. He was noted to have post-ictal confusion and sore, otherwise he feels back to baseline. This was believed to be a provoked seizures     11/05/2014- ER   (Pt was in his usual state of health when he went to bed last night, though was extremely tired (exhausted). He slept later than usual and did not get up during the night to go to the bathroom.  When he did wake up, he rushed to the bathroom because of the urgency, ended up running into the door hitting his head and causing a laceration over his eye. When he turned the lights on in the bathroom, he discovered that he had been incontinent (a trail of diarrhea from the bed to the bathroom). He was unaware of this happening, and even still the whole event remains a bit fuzzy. He has a lingering albeit mild headache all over. He did feel a bit nauseated during the clean-up process, but denied any fevers, pain, blood in stool or any frank LOC): Attributed to GI not seizure.     12/09/2014 -ER When he got home, his mother witnessed a generalized tonic-clonic seizure which lasted less than 5 minutes. He has had postictal state. He sustained no trauma or injury from the seizure. He's not been ill recently with fever, or other systemic symptoms. His Crohn's doctor is switching him to Humira.  Started on Keppra 500 mg BID then per  [facility] for this and  told to continue the medication for a few years. He has not had any recurrent seizures since. 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    07/12/2020- ER Large Grand mal seizure seen on ER at [facility] and given 2 g of Keppra with a plan to increase Keppra to 750 mg BID. The patient reports that he however had been taking 500 mg BID .     11/09/2020: Another self reported seizure, no medical notes.   In 04/2021: patient did have C2 dens fracture after slipping down the stairs at home when preparing for work and lumbar. The parents think this was due to a seizure, again no medical notes to support this.     07/2021: Has a seizures, reports going to the ER and and then in March 2023,  Keppra was increased to 1000 mg BID and no seizures since then.       History of Present Illness  The patient is accompanied by his father, mother called to supplement history . History details were challenging.      Seizure risk factors: The was the product of an uncomplicated pregnancy and delivery.  The patient had a normal development.There a history febrile seizure as an infant or child with phenobarbital used from 19 months for a total of 2 years.  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  There is no family history of seizures or epilepsy. Positive head injury.  Scoliosis.     Onset: The patient had his first seizure as an infant believed to be febrile and encephalitis related.    Number of seizure types: One  Seizure/seizure-like event  description:   - Aura: No clear warning,   - Semiology: Makes a sound, then whole body shaking, eyes open, drool, bites tongue and unclear UI. This lasts about 2-3 minutes,  has a post ictal period long time.   Frequency: As a child 19 months, on meds for 2 years, no seizures until 09/2014. Then sporadic 10/2014, 11/2014, 07/2020, 11/2020, 04/2021 and most recently 07/2021.   Provoking Features: stress not eating.   Work up done   EEG:   vEEG/ EMU evaluation: No   Brain MRI imaging: in 2016 was normal.     He has been  PB for 2 years (19 months for 2 years),  and no seizures  LEV 1000 mg BID with no side effect from seizure medication.    Admission for status epilepticus: No   Admission for frequent seizures: No  Injuries during seizures or attributed to seizures: No       Studies Reviewed:   Physician notes: Referral and ER notes reviewed.       Past Medical History: He  has a past medical history of Anemia, Crohn's colitis (CMS-HCC), and Spondylolysis.     Past Surgical History:    Past Surgical History:   Procedure Laterality Date    HERNIA REPAIR      NECK SURGERY  2016    spinal fusion.has screw in his spine    PR COLONOSCOPY W/BIOPSY SINGLE/MULTIPLE  09/15/2014    Procedure: COLONOSCOPY, FLEXIBLE, PROXIMAL TO SPLENIC FLEXURE; WITH BIOPSY, SINGLE OR MULTIPLE;  Surgeon: Zetta Bills, MD;  Location: GI PROCEDURES [facility] [facility];  Service: Gastroenterology    PR COLONOSCOPY W/BIOPSY SINGLE/MULTIPLE N/A 10/06/2016    Procedure: COLONOSCOPY, FLEXIBLE, PROXIMAL TO SPLENIC FLEXURE; WITH BIOPSY, SINGLE OR MULTIPLE;  Surgeon: Zetta Bills, MD;  Location: [facility] PROCEDURES [facility];  Service: Gastroenterology    PR COLONOSCOPY W/BIOPSY SINGLE/MULTIPLE N/A 01/04/2021    Procedure: COLONOSCOPY, FLEXIBLE, PROXIMAL TO SPLENIC FLEXURE; WITH BIOPSY, SINGLE OR MULTIPLE;  Surgeon: Zetta Bills, MD;  Location: GI PROCEDURES [place] [facility];  Service: Gastroenterology    PR COMPLEX DRAINAGE, WOUND Midline 05/20/2021    Procedure: INCISION & DRAINAGE, COMPLEX, POSTOPERATIVE WOUND INFECTION BACK/PRONE;  Surgeon: Seth Bake, MD;  Location: MAIN OR [facility];  Service: Neurosurgery    PR DEBRIDEMENT, SKIN, SUB-Q TISSUE,=<20 SQ CM Midline 07/19/2021    Procedure: DEBRIDEMENT; SKIN & SUBCUTANEOUS TISSUE TRUNK;  Surgeon: Clarene Duke, MD;  Location: MAIN OR [place];  Service: Plastics    PR DEBRIDEMENT, SKIN, SUB-Q TISSUE,MUSCLE,=<20 SQ CM Midline 06/18/2021    Procedure: DEBRIDEMENT; SKIN, SUBCUTANEOUS TISSUE, & MUSCLE;  Surgeon: Clarene Duke, MD;  Location: MAIN OR [place];  Service: Plastics    PR IONM 1 ON 1 IN OR W/ATTENDANCE EACH 15 MINUTES N/A 04/25/2014    Procedure: CONTINUOUS INTRAOPERATIVE NEUROPHYSIOLOGY MONITORING IN OR;  Surgeon: Everlean Alstrom, MD;  Location: MAIN OR [place];  Service: Neurosurgery    PR MUSCLE-SKIN FLAP,TRUNK Midline 05/24/2021    Procedure: MUSCLE, MYOCUTANEOUS, OR FASCIOCUTANEOUS FLAP; TRUNK;  Surgeon: Clarene Duke, MD;  Location: MAIN OR [place];  Service: Plastics    PR ODONTOID,FX,GRAFTING,OPEN Midline 04/25/2014    Procedure: OPEN TREATMENT &/OR REDCUCTION ODONTOID FX/DISLOCATION, ANTERIOR APPROACH, INTERNAL FIXATION, WITH GRAFT;  Surgeon: Everlean Alstrom, MD;  Location: MAIN OR [place];  Service: Neurosurgery    PR SURG DIAGNOSTIC EXAM, ANORECTAL N/A 01/21/2021    Procedure: ANORECTAL EXAM, SURGICAL, REQUIRING ANESTHESIA (GENERAL, SPINAL, OR EPIDURAL), DIAGNOSTIC;  Surgeon: Sharyn Lull, MD;  Location: MAIN OR ;  Service: Gastrointestinal    PR SURG EXCISION OF ANAL LESION(S) N/A 01/21/2021    Procedure: DESTRUCTION OF LESION(S), ANUS (EG, CONDYLOMA, PAPILLOMA, MOLLUSCUM CONTAGIOSUM) SIMPLE; SURGICAL EXCISION;  Surgeon: Earmon Phoenix  Milbert Coulter, MD;  Location: MAIN OR [facility];  Service: Gastrointestinal    TONSILLECTOMY         Family History: His family history includes Autoimmune disease in his maternal grandmother; Polycystic kidney disease in his sister.     Social history: He  reports that he has never smoked. He has never used smokeless tobacco. He reports that he does not currently use alcohol. He reports that he does not use drugs.      Medications:       Current Outpatient Medications:     ascorbic acid, vitamin C, (ASCORBIC ACID) 500 MG tablet, Take 1 tablet (500 mg total) by mouth daily., Disp: , Rfl:     azaTHIOprine (IMURAN) 50 mg tablet, TAKE 3 TABLETS BY MOUTH EVERY DAY, Disp: 270 tablet, Rfl: 0    busPIRone (BUSPAR) 15 MG tablet, TAKE 1 TABLET (15 MG TOTAL) BY MOUTH THREE (3) TIMES A DAY., Disp: 270 tablet, Rfl: 3    ferrous sulfate 325 (65 FE) MG tablet, Take 1 tablet (325 mg total) by mouth in the morning., Disp: , Rfl:     HUMIRA PEN CITRATE FREE 40 MG/0.4 ML, Inject the contents of 1 pen (40 mg total) under the skin every seven (7) days., Disp: 4 each, Rfl: 5    HUMIRA PEN CITRATE FREE STARTER PACK FOR CROHN'S/UC/HS 3 X 80 MG/0.8 ML, Inject the contents of 2 pens (160 mg total) day 1, then inject the contents of 1 pen (80 mg total) on day 15 for loading dose., Disp: 3 each, Rfl: 0    methocarbamoL (ROBAXIN) 500 MG tablet, TAKE 1 TABLET (500 MG TOTAL) BY MOUTH TWO (2) TIMES A DAY AS NEEDED FOR PAIN., Disp: 90 tablet, Rfl: 1    oxybutynin (DITROPAN) 5 MG tablet, Take 1 tablet (5 mg total) by mouth Three (3) times a day. (Patient taking differently: Take 1 tablet (5 mg total) by mouth in the morning.), Disp: 270 tablet, Rfl: 3    tamsulosin (FLOMAX) 0.4 mg capsule, Take 1 capsule (0.4 mg total) by mouth daily. (Patient taking differently: Take 1 capsule (0.4 mg total) by mouth daily. Evening), Disp: 90 capsule, Rfl: 3    levETIRAcetam (KEPPRA) 1000 MG tablet, Take 1 tablet (1,000 mg total) by mouth every twelve (12) hours., Disp: 180 tablet, Rfl: 3    midazolam 5 mg/spray (0.1 mL) Spry, Give 1 spray (5mg ) into 1 nostril. If no response in 10 min, may give second spray in other nostril. Do not give second dose if patient has trouble breathing or excessive sedation. Max 2 doses/seizure episode, 1 episode every 3 days or 5 episodes/month., Disp: 2 each, Rfl: 4     Allergies: He is allergic to infliximab.    Review of Systems:      Physical Examination:    Vitals:    12/14/21 1350   BP: 100/57   BP Site: L Arm   BP Position: Sitting   Pulse: 60   Weight: 66 kg (145 lb 8 oz)   Height: 177.8 cm (5' 10)        HEENT: Normocephalic, atraumatic. Chest was clear to auscultation bilaterally. Cardiovascular: Regular rate and rhythm.    On neurologic exam, the patient was alert and oriented to person, place, and time. Memory:fair.  Language was clear and fluent with fair fund of  concentration. Cranial Nerves: Pupils equal, round, and reactive to light. Visual fields full. Extraocular movements intact. Sensation intact V1 through V3 bilaterally. Smile and brow raise  were symmetric. Hearing intact to finger rub bilaterally. Tongue, uvula, and palate midline.      Motor: Limited plantar flexion on the right with AFO in place.   Normal muscle bulk, power in other extremities,although some limitation noted in the finger dexterity bilaterally.      Sensation was intact to light touch throughout.   Coordination: Past pointing in the bilateral hands.   Reflexes down in the right lower extremity.     Gait was limited with typical, spondylotic gait and guarded.     Other: No seizure activity noted during the exam.

## 2021-12-14 NOTE — Unmapped (Signed)
[facility]  [place] PA-C    HPI  Kerry Mooney is a 41 y.o. male who suffered a 10 ft fall 04/11/21 resulting in multiple spinal fractures & acute spinal cord injury s/p PLIF C2-4, L1-2 decompression laminectomy with reduction of fx T12-L3 PLA with pedicle screw fixation and local autographing on 04/12/21 at [facility], who then had a dehiscence of his incision. He underwent previous debridement and VAC placement by Neurosurgery on 05/20/21 followed by delayed closure by Plastics. His postoperative course was complicated by dehiscence, and he underwent debridement and VAC placement on 07/19/21. He presents for a wound check.     Interval History:   Overall the patient is doing well. They have been allowing more air exposure to the wound and it is showing interval healing. The patient is in PT/OT and is starting to ambulate independently. He denied any concerns for infection. No active issues reported today     Past Medical History:   Diagnosis Date    Anemia     Crohn's colitis (CMS-HCC)     Spondylolysis     Ankylosing spondylitis       Patient Active Problem List   Diagnosis    Dens fracture (CMS-HCC)    Crohn's disease of small and large intestines with complication (CMS-HCC)    Arthritis associated with inflammatory bowel disease    Anal condyloma    Wound dehiscence, surgical    Fall    Postoperative wound infection    Pressure injury of skin    Protein-calorie malnutrition, severe (CMS-HCC)    Spondylolysis    Crohn's colitis (CMS-HCC)    Closed burst fracture of lumbar vertebra with routine healing    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)    Routine health maintenance    Seizure disorder (CMS-HCC)       Past Surgical History:   Procedure Laterality Date    HERNIA REPAIR      NECK SURGERY  2016    spinal fusion.has screw in his spine    PR COLONOSCOPY W/BIOPSY SINGLE/MULTIPLE  09/15/2014    Procedure: COLONOSCOPY, FLEXIBLE, PROXIMAL TO SPLENIC FLEXURE; WITH BIOPSY, SINGLE OR MULTIPLE;  Surgeon: Zetta Bills, MD;  Location: [facility];  Service: Gastroenterology    PR COLONOSCOPY W/BIOPSY SINGLE/MULTIPLE N/A 10/06/2016    Procedure: COLONOSCOPY, FLEXIBLE, PROXIMAL TO SPLENIC FLEXURE; WITH BIOPSY, SINGLE OR MULTIPLE;  Surgeon: Zetta Bills, MD;  Location: [facility];  Service: Gastroenterology    PR COLONOSCOPY W/BIOPSY SINGLE/MULTIPLE N/A 01/04/2021    Procedure: COLONOSCOPY, FLEXIBLE, PROXIMAL TO SPLENIC FLEXURE; WITH BIOPSY, SINGLE OR MULTIPLE;  Surgeon: Zetta Bills, MD;  Location: [facility];  Service: Gastroenterology    PR COMPLEX DRAINAGE, WOUND Midline 05/20/2021    Procedure: INCISION & DRAINAGE, COMPLEX, POSTOPERATIVE WOUND INFECTION BACK/PRONE;  Surgeon: Seth Bake, MD;  Location: MAIN OR [facility];  Service: Neurosurgery    PR DEBRIDEMENT, SKIN, SUB-Q TISSUE,=<20 SQ CM Midline 07/19/2021    Procedure: DEBRIDEMENT; SKIN & SUBCUTANEOUS TISSUE TRUNK;  Surgeon: Clarene Duke, MD;  Location: MAIN OR [place];  Service: Plastics    PR DEBRIDEMENT, SKIN, SUB-Q TISSUE,MUSCLE,=<20 SQ CM Midline 06/18/2021    Procedure: DEBRIDEMENT; SKIN, SUBCUTANEOUS TISSUE, & MUSCLE;  Surgeon: Clarene Duke, MD;  Location: MAIN OR [place];  Service: Plastics    PR IONM 1 ON 1 IN OR W/ATTENDANCE EACH 15 MINUTES N/A 04/25/2014    Procedure: CONTINUOUS INTRAOPERATIVE NEUROPHYSIOLOGY MONITORING IN  OR;  Surgeon: Everlean Alstrom, MD;  Location: MAIN OR [facility];  Service: Neurosurgery    PR MUSCLE-SKIN FLAP,TRUNK Midline 05/24/2021    Procedure: MUSCLE, MYOCUTANEOUS, OR FASCIOCUTANEOUS FLAP; TRUNK;  Surgeon: Clarene Duke, MD;  Location: MAIN OR [place];  Service: Plastics    PR ODONTOID,FX,GRAFTING,OPEN Midline 04/25/2014    Procedure: OPEN TREATMENT &/OR REDCUCTION ODONTOID FX/DISLOCATION, ANTERIOR APPROACH, INTERNAL FIXATION, WITH GRAFT;  Surgeon: Everlean Alstrom, MD;  Location: MAIN OR [place];  Service: Neurosurgery PR SURG DIAGNOSTIC EXAM, ANORECTAL N/A 01/21/2021    Procedure: ANORECTAL EXAM, SURGICAL, REQUIRING ANESTHESIA (GENERAL, SPINAL, OR EPIDURAL), DIAGNOSTIC;  Surgeon: Sharyn Lull, MD;  Location: MAIN OR [place];  Service: Gastrointestinal    PR SURG EXCISION OF ANAL LESION(S) N/A 01/21/2021    Procedure: DESTRUCTION OF LESION(S), ANUS (EG, CONDYLOMA, PAPILLOMA, MOLLUSCUM CONTAGIOSUM) SIMPLE; SURGICAL EXCISION;  Surgeon: Sharyn Lull, MD;  Location: MAIN OR [place];  Service: Gastrointestinal    TONSILLECTOMY           Current Outpatient Medications:     methocarbamoL (ROBAXIN) 500 MG tablet, TAKE 1 TABLET (500 MG TOTAL) BY MOUTH TWO (2) TIMES A DAY AS NEEDED FOR PAIN., Disp: 90 tablet, Rfl: 1    azaTHIOprine (IMURAN) 50 mg tablet, TAKE 3 TABLETS BY MOUTH EVERY DAY, Disp: 270 tablet, Rfl: 0    busPIRone (BUSPAR) 15 MG tablet, TAKE 1 TABLET (15 MG TOTAL) BY MOUTH THREE (3) TIMES A DAY., Disp: 270 tablet, Rfl: 3    HUMIRA PEN CITRATE FREE 40 MG/0.4 ML, Inject the contents of 1 pen (40 mg total) under the skin every seven (7) days., Disp: 4 each, Rfl: 5    HUMIRA PEN CITRATE FREE STARTER PACK FOR CROHN'S/UC/HS 3 X 80 MG/0.8 ML, Inject the contents of 2 pens (160 mg total) day 1, then inject the contents of 1 pen (80 mg total) on day 15 for loading dose., Disp: 3 each, Rfl: 0    levETIRAcetam (KEPPRA) 1000 MG tablet, TAKE 1 TABLET BY MOUTH EVERY 12 HOURS, Disp: , Rfl:     oxybutynin (DITROPAN) 5 MG tablet, Take 1 tablet (5 mg total) by mouth Three (3) times a day., Disp: 270 tablet, Rfl: 3    tamsulosin (FLOMAX) 0.4 mg capsule, Take 1 capsule (0.4 mg total) by mouth daily., Disp: 90 capsule, Rfl: 3    Allergies   Allergen Reactions    Infliximab      Seizure activity  Other reaction(s): Other (See Comments)  Seizures       Family History   Problem Relation Age of Onset    Autoimmune disease Maternal Grandmother         Psoriasis    Polycystic kidney disease Sister     Anesthesia problems Neg Hx Ulcerative colitis Neg Hx     Crohn's disease Neg Hx     Colorectal Cancer Neg Hx     Pancreatic cancer Neg Hx     Esophageal cancer Neg Hx        Social History  Accompanied by father.    ROS  See HPI. All other systems reviewed are negative.    Physical Exam  BP 113/57 (BP Site: R Arm, BP Position: Sitting, BP Cuff Size: Medium)  - Pulse 71  - Temp 37.7 ??C (99.8 ??F) (Temporal)  - Resp 16  - SpO2 98%      General Appearance: No acute distress.   Pulmonary: Normal respiratory effort.   Skin:  Midline thoracic superficial wound with healthy wound  bed. Decreased area of overall wound diameter to 4x2cm. No concern for full thickness wound or infection today. Dry surrounding skin. Covered with island dressing    Assessment and Plan  Kerry Mooney is a 41 y.o. male who suffered a 10 ft fall 04/11/21 resulting in multiple spinal fractures & acute spinal cord injury s/p PLIF C2-4, L1-2 decompression laminectomy with reduction of fx T12-L3 PLA with pedicle screw fixation and local autographing on 04/12/21 at [facility], who then had a dehiscence of his incision. He underwent previous debridement and VAC placement by Neurosurgery on 05/20/21 followed by delayed closure by Plastics. His postoperative course was complicated by dehiscence, and he underwent debridement and VAC placement on 07/19/21.  - Overall I am very pleased with the interval healing of the midline thoracic wound. The wound is now only 2x4cm and healthy.   - Continue to leave open to air as much as possible when home  - Cover with [place] dressing when outside of home  - Avoid any pressure on wound  - Avoid hip flexion, upper extremity horizontal adduction and scapular protraction to minimize tension on wound  -Photographs were taken and placed in the patient's chart for comparison  - Follow up in 4 weeks if there are concerns otherwise we can push appointment back to 8 weeks if there continues to be interval healing.   The patient is planned to start [facility] tomorrow and they were instructed to keep close eye on the wound for concerning wound changes. If there are concerns I would like to see them back sooner  -The patient was in agreement with the plan and all questions were addressed to their satisfaction today.      Patina Spanier Isaac Bliss, MPAS  [facility]

## 2021-12-15 LAB — KEPPRA (LEVETIRACETAM): KEPPRA: 21.9 ug/mL (ref 6.0–46.0)

## 2021-12-16 ENCOUNTER — Ambulatory Visit: Admit: 2021-12-16 | Discharge: 2021-12-17 | Payer: PRIVATE HEALTH INSURANCE

## 2021-12-16 DIAGNOSIS — S21209S Unspecified open wound of unspecified back wall of thorax without penetration into thoracic cavity, sequela: Principal | ICD-10-CM

## 2021-12-16 NOTE — Unmapped (Signed)
OUTPATIENT PHYSICAL THERAPY  DAILY PROGRESS NOTES    Patient Name: Kerry Mooney  Date of Birth:25-Jun-1981  Date: 12/16/2021  Session Number: 11(1/10 to reassessment)  Therapy Diagnosis:   Encounter Diagnoses   Name Primary?    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    SCI Level: C2 ASIA D SCI    COVID Mask use: Patient wore a mask for the entire therapy session. and Patient and/or caregiver opted not to wear mask for duration or majority of session per updated clinic mask policy.      Assessment:   Today's session focused on LE strengthening with hook wall,  Ambulation with Bilateral trekking poles reassessment of progress toward the initially set goals.  Patient is making progress in all areas.  Continues to benefit from use of an AD, LC vs Trekking poles to reduce compensatory gait strategy.  Patient will continue to benefit from PT to maximize functional independence and resumption of pre-existing life roles.        Neurology Note: 12/14/21    This is the first [place] [facility] outpatient visit for Kerry Mooney is a 41 y.o. right handed man  a patient of Dr. Criselda Peaches who comes to establish care for his epilepsy.   The patient had a history of childhood seizures was on phenobarbital from 19 months for age using it for 2 years. He has not further seizures until March 2016.  He reports hitting his head prior to seizure recurrence in October 2015. He then progressed to having about 3-4 more seizures. Seizure sound epileptic. No EEG data although reports having a brain MRI in the past with no issues. He is currently taking Keppra 1000 mg BID changed in January 2023 following a seizure-like events with no further seizures. He will obtain a baseline EEG and levels as well as continue current medications. He will track any seizure like events and let me know should there be concerns.   In addition, while the patient reports having a follow up MRI after his seizures, I only see one in 2016 and other cervical or lumbar MRI. I would like to obtain a follow up study due to his seizures as well as the neuro examination findings.   Communication/consultation with other professionals: POC discussed with therapy team.     Prior Objective Testing  Tests Eval (10/28/21) 11/26/21 11/29/21 Reassessment  12/13/21    TUG 15.9 seconds, RW   23.85 sec with no UE support  21.6 sec ( no AD)    5x STS 28.6 seconds  20.91 sec  15.04 sec    WISCI II Level 6 (walker, brace on R, Min A - SBA x 1) Level 12 - (two crutches, brace on right, no assist)  Level 15 - Brace - Once Crutch , No Assistance > 10 M   10 MWT  TBD  0.62 m/s with walker 0.47 m/s 10 m/ 15.86 sec     0.63 m/s  without the walker      Problem List: decreased UE and LE strength, impaired balance, decreased ROM, impaired posture, deconditioning, impaired functional mobility, impaired ADLs and edema    Plan     Next session:   Continue LE strengthening -  Trial Ex with hook wall     Freq/Duration: per evaluation (10/28/21)  2x a week for 12 weeks     Home Exercise ID: [id number]    Goals:  Patient/Family Goals:I want to be strong enough to walk without the walker and make walking my primary means of getting around.     Short Term Goals:  In 6 weeks:  Patient will be able to properly demonstrate current HEP independently x1 in clinic to build upon functional gains in PT.  Met 11/29/21  Patient will ambulate 500 feet with RW, AFO donned on R LE, and decreased gait deviation to allow return to household/community ambulation. Progressing 250 feet with LC and AFOS  Patient will ascend and descend 3 stairs with CGA assistance, with  1 or no handrails and safe technique demonstrating step through pattern in order to increase independence with household/community mobility.Met 11/29/21  Patient will be able to perform floor transfer with bilateral UE support and supervision assist.  Met 11/29/21  Patient will improve Timed Up and Go score to < 12 seconds to further reduce fall risk. Not Met 12/13/21  Patient will ambulate >1.0 meters/second to decrease fall risk and improve household/community ambulation Not Met 12/13/21  Patient will complete FGA to assess dynamic balance during gait.  TBA  Patient will complete .  Met 11/29/21      Long Term Goals:  12  weeks:  Patient will use ambulation with LRAD as primary means of mobility more than 60% of the time.   Patient will be able to ambulate 1,07ft with minimal gait deviations and AFO donned on R LE to return to community ambulation and demonstrate improved ambulation endurance.   Patient will ascend and descend 6 stairs with SBA assistance, with no handrails and safe technique demonstrating step through pattern in order to increase independence with household/community mobility.  Patient will be able to perform floor transfer with no external support and SBA.   -  Met  11/26/21  Patient will improve TUG score to < 10 seconds to further reduce falls risk. Today???s assessment is for specific pre or post testing only per MD request. Full PT evaluation and Skilled Physical Therapy treatment intervention have not been requested.        Pain :  2/10 legs tender   Falls - None  TE:  20    Hook wall Resistance Exercise for :   Hip Extension on/off the  mat.    Leg Press    SLR,     Bridging     Hip Abd in sidelying     Core Engagement exercise with Green T-band and I box step  with CGA.     There Act  : 25  min  Sit to stand s 2 x 10 rep     Measured Trekking poles.  36 inches     Trial of walking and coordinating poles for balance.     UP and down curbs 4 inch and 6 inch            See outcome measures above.     TUG: 21.6 sec with no UE support   5 STS 15.05  10 M WT : 0.63 m/s  Home Exercise Program:    - sit<>stand with UE extended, eccentric lowering emphasized   - at half wall for postural support:    - hip abd with glut med focus (2 UE support)    - hip flex (1 UE support)    - hip flex into extension (straight knee, 1 UE support)    [employer] [id number]  Added 11/24/21    Postural Exercises:   Shoulder ER with hands behind head  Horizontal Abd/Add  Shoulder Flexion: I and Y  TA - Alternating Heel slides,  TA - Alternating Hip Abd/ Add,    Supine bicycle legs      Treatment Rendered:    Total Treatment Time: 45 min     TE 20 min   TA 25 min     I attest that I have reviewed the above information.  Signed: Vira Blanco, PT, DPT  12/16/2021 9:19 AM

## 2021-12-16 NOTE — Unmapped (Signed)
OUTPATIENT OCCUPATIONAL THERAPY   Note Type: Re-evaluation/Progress Note       Patient Name: Kerry Mooney  Date of Birth:[birth year]  Date of Visit: 12/16/2021  Visit #:  Re-assessment, 7 total.  Date of Injury/Onset: Dec 2022  Date of Evaluation: 10/21/21  Referring Physician: Caren Griffins*  Reason for Referral: Eval and Treat  Plan of Care: 10/21/21-01/20/22  Encounter Diagnoses   Name Primary?    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 year old R handed male with history of C2 ASIA D SCI in Dec 2022 who presents with kyphotic posture, resulting in decreased shoulder ROM and pain with movement, and poor FMC, impairing participation in ADLs and IADLs.  This session, reassessed progress toward goals; pt endorses things going very well for him at home, continues to improve in his ADL/IADL participation and noticing greater ability to do all of these tasks independently.  Patient remains limited by decreased sensation in LUE and impaired B coordination.  Will continue to address coordination tasks to maximize functional performance.  Patient requires skilled outpatient Occupational Therapy services to address the above deficits and maximize independence with ADLs and IADLs in home environment.        Complexity:  Moderate Complexity Eval Code: Clinical decision making was of moderate complexity, as data from the client???s history, profile, detailed assessments, and consideration of several treatment options.  Patient presents with comorbidities that affect occupational performance.  Minimal to moderate modification of tasks is necessary.        Long Term Goals:   1. In 9 sessions, patient will perform upgraded home exercise program independently  to max IND with ADLs and IADLs. MET 12/16/21  2. In 9 sessions, patient will demonstrate subjective improvement in writing quality from 55% to 70% MET 12/16/21- 65-70%  3. In 9 sessions, patient will demonstrate 1 adapted strategy to assist with clothing management (shirt) MET 12/16/21  4. In 9 sessions, patient will demonstrate no reports of pain while placing an item at/above shoulder height to max participation in household chores Progress 12/16/21  5. In 9 sessions, patient will participate in simulated driving screen MET 07/16/08 rec on road test, DMV   6. In 9 sessions, patient will demonstrate improved L coordination with 9hpt to place all pegs in board in 60s or less  Progress 12/16/21 23min15sec    Patient in agreement with plan of care?: yes    Prognosis for goal achievement: Good due to good motivation, supportive family, and overall health status.    PLAN  Pt. will participate in:  Therapeutic Exercise  Therapeutic Acitivity  Manual Therapy  Neuromuscular Re-education  Orthotic management  Self-care home training    Next session: overhead reaching, coordination     Planned frequency and duration of treatment:  1x/week 8 weeks.  Plan will be adjusted as needed.     SUBJECTIVE:  Feels like he's doing really well with things    History of Present Condition: Per referring physician's note:  History of Present Illness: Patient is a 41 y.o. year old male seen following his AIR stay in 06/2021 and 07/2021. He was admitted initially to [facility] following a fall 10 FT through a ceiling landing on exercise equipment. 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 PLIF C2-4, l1-2 decompression laminectomy with reduction  of fx T12-L3 PLA with pedicle screw fixation and local autographing. PMH: seizures, ankylosing spondylitis, TBI and crohns disease. Patient was admitted to [facility] AIR from 10/18-10/24/2022, readmitted to [facility] on 05/03/21 for assessment of cervical wound due to drainage and concern for infection and readmitted to [facility] AIR from 06/01/21 to 06/16/21 before requiring transfer back to acute for wound debridement/ washout.Patient was readmitted again to [facility] AIR from 06/25/21-07/18/21 before being transferred back to [facility] main for wound washout.    At the time of his discharge, patient had improved bladder sensation and was performing timed toileting.  He was discharged on oxybutynin 5 mg 3 times daily as well as Flomax 0.4 mg nightly.  He had also had improved sensation and control with his bowels and was no longer on a formalized bowel program.  He was contact-guard assist with his transfers using a rolling walker and mod I to set up with most ADLs.  He was still primarily using a power wheelchair for mobility.     SCI Injury/level: C2 ASIA D  Interval History:   Since discharge from inpatient, patient has been seen by the infectious disease clinic and has completed all necessary antibiotics.  He is also followed closely by plastic surgery in clinic.  Based on their last note, surgical site continues to heal well.  1. Bowel: Patient previously had been on Metamucil daily, MiraLAX and 2 tabs of senna at noon, Colace twice daily, and Enemeez nightly.  Since return to home, patient has been seen by his GI physician and currently takes Metamucil as needed, no longer takes MiraLAX or Colace, takes 1 tab of senna daily, and no longer requires Enemeez for bowel movements.  States that he essentially has a bowel movement every morning and denies having any bowel accidents.  Patient states that his sensation for his bowel movements has continued to improve and he now can feel it coming.  2. Bladder: Patient continues to take the oxybutynin 5 mg 3 times daily as well as Flomax 0.4 nightly.  States that he has regular, controlled urinary voids.  Denies any leaking or accidents.  Is interested in possibly cutting down on his bladder medications.  3. Spasticity: Patient continues to have no issues with spasticity at this time.  4. Skin: Patient has no skin breakdown anywhere other than his initial surgical wound.  Wound was visualized today and has healed well via secondary intention.  Still a few small areas the proximal end of the incision that have not entirely healed.  5. Neuropathic pain: Patient denies any issues with neuropathic pain.  6. Equipment: Patient uses his power wheelchair although is seen in a transport chair in clinic today.  Patient has a tub bench that he uses regularly.  Also has a rolling walker that he uses for transfers as well as some limited walking, primarily with therapy.  7. Therapy: Patient is completed home health therapy and is currently transitioning to outpatient PT and OT here at the [facility].    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[employer], installed cubicles and office furniture    OBJECTIVE  Physical Performance Testing  Eval Coordination  Nine Hole Peg Test RUE: 66s/ LUE placed 3 in 80s  Today: RUE- 57s/ LUE placed 5 in 80s, whole thing 57min44s (did a second attempt, 7 in 80s, whole thing 30min15sec    Therapeutic Activities  Coordination tasks to continue to address in hand manipulation and overall fine motor grasps  Grooved pegboard to work on shifting between tripod pinch in BUEs, struggled with size  Switched to large pegs, remained a challenge to grasp  Grooved poker chips to manipulate and flip within hand  Stacked large pegs to work on rotation     HEP  Supine on wedge and 2 pillows  Horizontal abduction stretch 90sec x5  Overhead abduction x10- snow angel to point of shoulder height  Protraction/retraction  Goal post ER stretch  Butterfly ER stretch          Total Treatment Time:  40 minutes    Education:  Topics:Disease Glass blower/designer Exercise  Therapist, nutritional  Education Provided to: patient and family  Education Type:Demonstration and Explanation  Response to education/teachback:Verbal understanding recieved    I reviewed the no-show/attendance policy with the patient and caregiver(s). The family is aware that they must call to cancel appointments more than 24 hours in advance. They are also aware that if they late cancel or no-show three times, we reserve the right to cancel their remaining appointments. This policy is in place to allow [place] to best serve the needs of our caseload.       I attest that I have reviewed the above information.  Signed: Earlie Counts, OT 12/16/2021 2:24 PM

## 2021-12-20 NOTE — Unmapped (Signed)
OUTPATIENT OCCUPATIONAL THERAPY   Note Type: Treatment Note       Patient Name: Kerry Mooney  Date of Birth:1981-03-21  Date of Visit: 12/20/2021  Visit #:  1/10 Toward Re-assessment, 8 total.  Date of Injury/Onset: Dec 2022  Date of Evaluation: 10/21/21  Referring Physician: Caren Griffins*  Reason for Referral: Eval and Treat  Plan of Care: 10/21/21-01/20/22  Encounter Diagnoses   Name Primary?    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 year old R handed male with history of C2 ASIA D SCI in Dec 2022 who presents with kyphotic posture, resulting in decreased shoulder ROM and pain with movement, and poor FMC, impairing participation in ADLs and IADLs.  This session, addressed [facility] with tweezer tasks to pinch and grade force with tactile cue of dycem to help maintain grasp and lightly release and squeeze tweezers.  Continues to endorse positive participation in daily living tasks with CLOF, rec practicing with office furniture at home to use small tools to max PLOF.  Patient requires skilled outpatient Occupational Therapy services to address the above deficits and maximize independence with ADLs and IADLs in home environment.        Complexity:  Moderate Complexity Eval Code: Clinical decision making was of moderate complexity, as data from the client???s history, profile, detailed assessments, and consideration of several treatment options.  Patient presents with comorbidities that affect occupational performance.  Minimal to moderate modification of tasks is necessary.        Long Term Goals:   1. In 9 sessions, patient will perform upgraded home exercise program independently  to max IND with ADLs and IADLs. MET 12/16/21  2. In 9 sessions, patient will demonstrate subjective improvement in writing quality from 55% to 70% MET 12/16/21- 65-70%  3. In 9 sessions, patient will demonstrate 1 adapted strategy to assist with clothing management (shirt) MET 12/16/21  4. In 9 sessions, patient will demonstrate no reports of pain while placing an item at/above shoulder height to max participation in household chores Progress 12/16/21  5. In 9 sessions, patient will participate in simulated driving screen MET 07/16/08 rec on road test, DMV   6. In 9 sessions, patient will demonstrate improved L coordination with 9hpt to place all pegs in board in 60s or less  Progress 12/16/21 78min15sec    Patient in agreement with plan of care?: yes    Prognosis for goal achievement: Good due to good motivation, supportive family, and overall health status.    PLAN  Pt. will participate in:  Therapeutic Exercise  Therapeutic Acitivity  Manual Therapy  Neuromuscular Re-education  Orthotic management  Self-care home training    Next session: overhead reaching, coordination- BITS?    Planned frequency and duration of treatment:  1x/week 8 weeks.  Plan will be adjusted as needed.     SUBJECTIVE:  Been doing well with exercises     History of Present Condition: Per referring physician's note:  History of Present Illness: Patient is a 41 y.o. year old male seen following his AIR stay in 06/2021 and 07/2021. He was admitted initially to [facility] following a fall 10 FT through a ceiling landing on exercise equipment. 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 PLIF C2-4, l1-2 decompression laminectomy with reduction of fx T12-L3 PLA with pedicle  screw fixation and local autographing. PMH: seizures, ankylosing spondylitis, TBI and crohns disease. Patient was admitted to [facility] AIR from 10/18-10/24/2022, readmitted to [facility] on 05/03/21 for assessment of cervical wound due to drainage and concern for infection and readmitted to [facility] AIR from 06/01/21 to 06/16/21 before requiring transfer back to acute for wound debridement/ washout.Patient was readmitted again to [facility] AIR from 06/25/21-07/18/21 before being transferred back to [facility] main for wound washout.    At the time of his discharge, patient had improved bladder sensation and was performing timed toileting.  He was discharged on oxybutynin 5 mg 3 times daily as well as Flomax 0.4 mg nightly.  He had also had improved sensation and control with his bowels and was no longer on a formalized bowel program.  He was contact-guard assist with his transfers using a rolling walker and mod I to set up with most ADLs.  He was still primarily using a power wheelchair for mobility.     SCI Injury/level: C2 ASIA D  Interval History:   Since discharge from inpatient, patient has been seen by the infectious disease clinic and has completed all necessary antibiotics.  He is also followed closely by plastic surgery in clinic.  Based on their last note, surgical site continues to heal well.  1. Bowel: Patient previously had been on Metamucil daily, MiraLAX and 2 tabs of senna at noon, Colace twice daily, and Enemeez nightly.  Since return to home, patient has been seen by his GI physician and currently takes Metamucil as needed, no longer takes MiraLAX or Colace, takes 1 tab of senna daily, and no longer requires Enemeez for bowel movements.  States that he essentially has a bowel movement every morning and denies having any bowel accidents.  Patient states that his sensation for his bowel movements has continued to improve and he now can feel it coming.  2. Bladder: Patient continues to take the oxybutynin 5 mg 3 times daily as well as Flomax 0.4 nightly.  States that he has regular, controlled urinary voids.  Denies any leaking or accidents.  Is interested in possibly cutting down on his bladder medications.  3. Spasticity: Patient continues to have no issues with spasticity at this time.  4. Skin: Patient has no skin breakdown anywhere other than his initial surgical wound.  Wound was visualized today and has healed well via secondary intention.  Still a few small areas the proximal end of the incision that have not entirely healed.  5. Neuropathic pain: Patient denies any issues with neuropathic pain.  6. Equipment: Patient uses his power wheelchair although is seen in a transport chair in clinic today.  Patient has a tub bench that he uses regularly.  Also has a rolling walker that he uses for transfers as well as some limited walking, primarily with therapy.  7. Therapy: Patient is completed home health therapy and is currently transitioning to outpatient PT and OT here at the [facility].    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[employer], [employer] and office furniture  OBJECTIVE  Therapeutic Activities  Tweezer dexterity task with thin pegs- whole hand grasp initially  Wrapped in dycem for thicker hold and greater texture feedback  Cues for grading pinch to very slightly loosen pinch to allow peg to straighten   Improved over time and was able to place 15  Switched tasks to [employer] with tweezers, placing peg, washer, and cylinder   Removed with pinch grasp R hand on [employer], L hand on Tweezer Dexterity  L pinch coordination great on thin pegs   B removal of thin pegs     HEP  Supine on wedge and 2 pillows  Horizontal abduction stretch 90sec x5  Overhead abduction x10- snow angel to point of shoulder height  Protraction/retraction  Goal post ER stretch  Butterfly ER stretch      Total Treatment Time:  40 minutes    Education:  Topics:Disease Glass blower/designer Exercise  Therapist, nutritional  Education Provided to: patient and family  Education Type:Demonstration and Explanation  Response to education/teachback:Verbal understanding recieved    I reviewed the no-show/attendance policy with the patient and caregiver(s). The family is aware that they must call to cancel appointments more than 24 hours in advance. They are also aware that if they late cancel or no-show three times, we reserve the right to cancel their remaining appointments. This policy is in place to allow [place] to best serve the needs of our caseload.       I attest that I have reviewed the above information.  Signed: Earlie Counts, OT 12/20/2021 10:03 AM

## 2021-12-22 NOTE — Unmapped (Signed)
OUTPATIENT PHYSICAL THERAPY  DAILY PROGRESS NOTES    Patient Name: Kerry Mooney  Date of Birth:13-Mar-1981  Date: 12/22/2021  Session Number: 12(2/10 to reassessment)  Therapy Diagnosis:   Encounter Diagnoses   Name Primary?    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 plastic surgery for wound care.    SCI Level: C2 ASIA D SCI    COVID Mask use: Patient wore a mask for the entire therapy session. and Patient and/or caregiver opted not to wear mask for duration or majority of session per updated clinic mask policy.      Assessment:   Today's session focused on glut strengthening, ambulation with Bilateral trekking poles and endurance training.  We have begun discussing possible transitioning to a community exercise routine however did stress the need to speak more with MD about lifting restrictions.  He would benefit from a gym to begin working on endurance as he does not have any areas in or around his home to allow for long distance walking.  At this time, Kerry Mooney continues to benefit from PT to maximize functional independence and resumption of pre-existing life roles.        Communication/consultation with other professionals: n/a today     Prior Objective Testing  Tests Eval (10/28/21) 11/26/21 11/29/21 Reassessment  12/13/21    TUG 15.9 seconds, RW   23.85 sec with no UE support  21.6 sec ( no AD)    5x STS 28.6 seconds  20.91 sec  15.04 sec    WISCI II Level 6 (walker, brace on R, Min A - SBA x 1) Level 12 - (two crutches, brace on right, no assist) Level 15 - Brace - Once Crutch , No Assistance > 10 M   10 MWT  TBD  0.62 m/s with walker 0.47 m/s 10 m/ 15.86 sec     0.63 m/s without the walker      Problem List: decreased UE and LE strength, impaired balance, decreased ROM, impaired posture, deconditioning, impaired functional mobility, impaired ADLs and edema    Plan     Next session:   Continue LE strengthening -  quadruped, tall kneeling for hip stability    Freq/Duration: per evaluation (10/28/21)  2x a week for 12 weeks     Home Exercise ID: [id number]    Goals:  Patient/Family Goals:I want to be strong enough to walk without the walker and make walking my primary means of getting around.     Short Term Goals:  In 6 weeks:  Patient will be able to properly demonstrate current HEP independently x1 in clinic to build upon functional gains in PT.  Met 11/29/21  Patient will ambulate 500 feet with RW, AFO donned on R LE, and decreased gait deviation to allow return to household/community ambulation. Progressing 250 feet with LC and AFOS  Patient will ascend and descend 3 stairs with CGA assistance, with  1 or no handrails and safe technique demonstrating step through pattern in order to increase  independence with household/community mobility.Met 11/29/21  Patient will be able to perform floor transfer with bilateral UE support and supervision assist.  Met 11/29/21  Patient will improve Timed Up and Go score to < 12 seconds to further reduce fall risk. Not Met 12/13/21  Patient will ambulate >1.0 meters/second to decrease fall risk and improve household/community ambulation Not Met 12/13/21  Patient will complete FGA to assess dynamic balance during gait.  TBA  Patient will complete .  Met 11/29/21      Long Term Goals:  12  weeks:  Patient will use ambulation with LRAD as primary means of mobility more than 60% of the time.   Patient will be able to ambulate 1,025ft with minimal gait deviations and AFO donned on R LE to return to community ambulation and demonstrate improved ambulation endurance.   Patient will ascend and descend 6 stairs with SBA assistance, with no handrails and safe technique demonstrating step through pattern in order to increase independence with household/community mobility.  Patient will be able to perform floor transfer with no external support and SBA.   -  Met  11/26/21  Patient will improve TUG score to < 10 seconds to further reduce falls risk. Today???s assessment is for specific pre or post testing only per MD request. Full PT evaluation and Skilled Physical Therapy treatment intervention have not been requested.      Subjective:   Pt states: at home i am using the power chair for the long distances but walking the rest.  I took out the trash out without thinking about it  HEP: has been keeping up with standing exercises - feels like he is exercising but they aren't super challenging  Pain: no complaints     Reason for Referral/History of Present Condition/Onset of injury/exacerbation:   41 y.o. male presents with PMH of C2 ASIA D SCI from 8ft fall through attic (04/11/2021), Patient has a PMH of seizures, ankylosing spondylitis, TBI and crohns disease. Per MD note, patient was admitted to [facility] AIR from 10/18-10/24/2022, readmitted to [facility] on 05/03/21 for assessment of cervical wound due to drainage and concern for infection and readmitted to [facility] AIR from 06/01/21 to 06/16/21 before requiring transfer back to acute for wound debridement/washout. Patient was readmitted again to [facility] AIR from 06/25/21-07/18/21 before being transferred back to [facility] main for wound washout      Objective:      Pain:  its fine  Falls: None    Ther Exer: 45 mins  - Quadruped   Hip extension - required cueing to keep weight back in legs and CGA to keep R hip stable (often over shifts to the right)    Amb x 200' walking and coordinating poles for balance. Was able to hold conversation and sequence poles with only 2 mistakes    NuStep x 10 mins   Lv 6 Profile 5 (with heavy and light intervals   Mile: 0.45   BORG: 5/6    Home Exercise Program:    - sit<>stand with UE extended, eccentric lowering emphasized   - at half wall for postural support:    - hip abd with glut med focus (2 UE support)    - hip flex (1 UE support)    - hip flex into extension (straight knee, 1 UE support)    [employer] [id number]  Added 11/24/21    Postural Exercises:   Shoulder ER with hands behind head  Horizontal Abd/Add  Shoulder Flexion: I and Y  TA - Alternating Heel slides,  TA - Alternating Hip Abd/ Add,    Supine bicycle legs    Treatment Rendered:    Total Treatment Time: 45 min      I attest that I have reviewed the above information.  Signed: Arlyce Dice, PT, DPT  12/22/2021 6:06 PM

## 2021-12-23 NOTE — Unmapped (Signed)
OUTPATIENT PHYSICAL THERAPY  DAILY PROGRESS NOTES    Patient Name: Kerry Mooney  Date of Birth:03/26/1981  Date: 12/23/2021  Session Number: 13 (3/10 to reassessment)  Therapy Diagnosis:   Encounter Diagnoses   Name Primary?    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    SCI Level: C2 ASIA D SCI    COVID Mask use: Patient wore a mask for the entire therapy session. and Patient and/or caregiver opted not to wear mask for duration or majority of session per updated clinic mask policy.      Assessment:   Today's session focused on lateral and posterior hip strengthening, core integration and ambulation with Bilateral trekking poles.  We discussed walking at the local mall to build endurance using trekking poles every other day and the rollator occasionally to allow for longer distances without the concern of balance.  He has great difficulty stabilize on the R LE due to poor proximal hip strength which he was more attentive to with a mirror while in quadruped.  His HEP has been updated to include lateral hip exercises.  At this time, [name] continues to benefit from PT to maximize functional independence and resumption of pre-existing life roles.        Communication/consultation with other professionals: n/a today     Prior Objective Testing  Tests Eval (10/28/21) 11/26/21 11/29/21 Reassessment  12/13/21    TUG 15.9 seconds, RW   23.85 sec with no UE support  21.6 sec ( no AD)    5x STS 28.6 seconds  20.91 sec  15.04 sec WISCI II Level 6 (walker, brace on R, Min A - SBA x 1) Level 12 - (two crutches, brace on right, no assist)  Level 15 - Brace - Once Crutch , No Assistance > 10 M   10 MWT  TBD  0.62 m/s with walker 0.47 m/s 10 m/ 15.86 sec     0.63 m/s without the walker      Problem List: decreased UE and LE strength, impaired balance, decreased ROM, impaired posture, deconditioning, impaired functional mobility, impaired ADLs and edema    Plan     Next session:   Continue LE strengthening -  quadruped, tall kneeling for hip stability, long distance ambulation/endurance    Freq/Duration: per evaluation (10/28/21)  2x a week for 12 weeks     Home Exercise ID: [id number]    Goals:  Patient/Family Goals:I want to be strong enough to walk without the walker and make walking my primary means of getting around.     Short Term Goals:  In 6 weeks:  Patient will be able to properly demonstrate current HEP independently x1 in clinic to build upon functional gains in PT.  Met 11/29/21  Patient will ambulate 500 feet with RW, AFO donned on R LE, and decreased gait deviation to allow return to household/community ambulation. Progressing 250 feet with LC and AFOS  Patient will ascend and descend 3 stairs  with CGA assistance, with  1 or no handrails and safe technique demonstrating step through pattern in order to increase independence with household/community mobility.Met 11/29/21  Patient will be able to perform floor transfer with bilateral UE support and supervision assist.  Met 11/29/21  Patient will improve Timed Up and Go score to < 12 seconds to further reduce fall risk. Not Met 12/13/21  Patient will ambulate >1.0 meters/second to decrease fall risk and improve household/community ambulation Not Met 12/13/21  Patient will complete FGA to assess dynamic balance during gait.  TBA  Patient will complete .  Met 11/29/21      Long Term Goals:  12  weeks:  Patient will use ambulation with LRAD as primary means of mobility more than 60% of the time.   Patient will be able to ambulate 1,043ft with minimal gait deviations and AFO donned on R LE to return to community ambulation and demonstrate improved ambulation endurance.   Patient will ascend and descend 6 stairs with SBA assistance, with no handrails and safe technique demonstrating step through pattern in order to increase independence with household/community mobility.  Patient will be able to perform floor transfer with no external support and SBA.   -  Met  11/26/21  Patient will improve TUG score to < 10 seconds to further reduce falls risk. Today???s assessment is for specific pre or post testing only per MD request. Full PT evaluation and Skilled Physical Therapy treatment intervention have not been requested.      Subjective:   Pt states: i could 100% walk at the [facility]  HEP: updated today  Pain: no complaints     Reason for Referral/History of Present Condition/Onset of injury/exacerbation:   41 y.o. male presents with PMH of C2 ASIA D SCI from 20ft fall through attic (04/11/2021), Patient has a PMH of seizures, ankylosing spondylitis, TBI and crohns disease. Per MD note, patient was admitted to [facility] AIR from 10/18-10/24/2022, readmitted to [facility] on 05/03/21 for assessment of cervical wound due to drainage and concern for infection and readmitted to [facility] AIR from 06/01/21 to 06/16/21 before requiring transfer back to acute for wound debridement/washout. Patient was readmitted again to [facility] AIR from 06/25/21-07/18/21 before being transferred back to [facility] main for wound washout      Objective:      Pain:  its fine  Falls: None    Ther Exer: 45 mins    HEP development:  - clam shell  - hip abd (straight leg)  - Quadruped   Hip extension - required cueing to keep weight back in legs and CGA to keep R hip stable (often over shifts to the right), utilized mirror to help keep COG even   Modified Bird dog  - sit<>stand with UE extended, feet staggered for L disavantaged, eccentric lowering emphasized    Amb x (200') walking and coordinating poles for balance. Was able to hold conversation and sequence poles with only 1 mistake - was clearly fatigued      Home Exercise Program:    - sit<>stand with UE extended, feet staggered for L disavantaged, eccentric lowering emphasized   - at half wall for postural support:    - hip abd with glut med focus (2 UE support)    - hip flex (1 UE support)    - hip flex into extension (straight knee, 1 UE support)  - clam shell  - hip abd (straight leg)  - modified bird dog  -  Postural Exercises:   Shoulder ER with hands behind head  Horizontal Abd/Add  Shoulder Flexion: I and Y  TA - Alternating Heel slides,  TA - Alternating Hip Abd/ Add,    Supine bicycle legs    [employer] [id number]      Treatment Rendered:    Total Treatment Time: 45 min      I attest that I have reviewed the above information.  Signed: Arlyce Dice, PT, DPT  12/23/2021 9:53 AM

## 2021-12-27 ENCOUNTER — Ambulatory Visit: Admit: 2021-12-27 | Payer: PRIVATE HEALTH INSURANCE

## 2021-12-27 ENCOUNTER — Ambulatory Visit: Admit: 2021-12-27 | Discharge: 2022-01-25 | Payer: PRIVATE HEALTH INSURANCE

## 2021-12-27 NOTE — Unmapped (Signed)
OUTPATIENT OCCUPATIONAL THERAPY   Note Type: Treatment Note       Patient Name: Kerry Mooney  Date of Birth:1981-01-25  Date of Visit: 12/27/2021  Visit #:  2/10 Toward Re-assessment, 9 total.  Date of Injury/Onset: Dec 2022  Date of Evaluation: 10/21/21  Referring Physician: Caren Griffins*  Reason for Referral: Eval and Treat  Plan of Care: 10/21/21-01/20/22  Encounter Diagnoses   Name Primary?    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 year old R handed male with history of C2 ASIA D SCI in Dec 2022 who presents with kyphotic posture, resulting in decreased shoulder ROM and pain with movement, and poor FMC, impairing participation in ADLs and IADLs.  This session, addressed BUE overhead reaching on BITS with pt demonstrating difficulty reaching across his body with RUE or using LUE overhead with maintaining steady dynamic balance- R foot lifting off ground when reaching overhead.  Educated on importance of keeping B feet planted for safety with his balance.    Patient requires skilled outpatient Occupational Therapy services to address the above deficits and maximize independence with ADLs and IADLs in home environment.        Complexity:  Moderate Complexity Eval Code: Clinical decision making was of moderate complexity, as data from the client???s history, profile, detailed assessments, and consideration of several treatment options.  Patient presents with comorbidities that affect occupational performance.  Minimal to moderate modification of tasks is necessary.        Long Term Goals:   1. In 9 sessions, patient will perform upgraded home exercise program independently  to max IND with ADLs and IADLs. MET 12/16/21  2. In 9 sessions, patient will demonstrate subjective improvement in writing quality from 55% to 70% MET 12/16/21- 65-70%  3. In 9 sessions, patient will demonstrate 1 adapted strategy to assist with clothing management (shirt) MET 12/16/21  4. In 9 sessions, patient will demonstrate no reports of pain while placing an item at/above shoulder height to max participation in household chores Progress 12/16/21  5. In 9 sessions, patient will participate in simulated driving screen MET 07/16/08 rec on road test, DMV   6. In 9 sessions, patient will demonstrate improved L coordination with 9hpt to place all pegs in board in 60s or less  Progress 12/16/21 5min15sec    Patient in agreement with plan of care?: yes    Prognosis for goal achievement: Good due to good motivation, supportive family, and overall health status.    PLAN  Pt. will participate in:  Therapeutic Exercise  Therapeutic Acitivity  Manual Therapy  Neuromuscular Re-education  Orthotic management  Self-care home training    Next session: dc    Planned frequency and duration of treatment:  1x/week 8 weeks.  Plan will be adjusted as needed.     SUBJECTIVE:  Taking out trash, replaced 5 gallon water jug    History of Present Condition: Per referring physician's note:  History of Present Illness: Patient is a 41 y.o. year old male seen following his AIR stay in 06/2021 and 07/2021. He was admitted initially to [facility] following a fall 10 FT through a ceiling landing on exercise equipment. 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 PLIF C2-4, l1-2 decompression laminectomy with reduction of  fx T12-L3 PLA with pedicle screw fixation and local autographing. PMH: seizures, ankylosing spondylitis, TBI and crohns disease. Patient was admitted to [facility] AIR from 10/18-10/24/2022, readmitted to [facility] on 05/03/21 for assessment of cervical wound due to drainage and concern for infection and readmitted to [facility] AIR from 06/01/21 to 06/16/21 before requiring transfer back to acute for wound debridement/ washout.Patient was readmitted again to [facility] AIR from 06/25/21-07/18/21 before being transferred back to [facility] main for wound washout.    At the time of his discharge, patient had improved bladder sensation and was performing timed toileting.  He was discharged on oxybutynin 5 mg 3 times daily as well as Flomax 0.4 mg nightly.  He had also had improved sensation and control with his bowels and was no longer on a formalized bowel program.  He was contact-guard assist with his transfers using a rolling walker and mod I to set up with most ADLs.  He was still primarily using a power wheelchair for mobility.     SCI Injury/level: C2 ASIA D  Interval History:   Since discharge from inpatient, patient has been seen by the infectious disease clinic and has completed all necessary antibiotics.  He is also followed closely by plastic surgery in clinic.  Based on their last note, surgical site continues to heal well.  1. Bowel: Patient previously had been on Metamucil daily, MiraLAX and 2 tabs of senna at noon, Colace twice daily, and Enemeez nightly.  Since return to home, patient has been seen by his GI physician and currently takes Metamucil as needed, no longer takes MiraLAX or Colace, takes 1 tab of senna daily, and no longer requires Enemeez for bowel movements.  States that he essentially has a bowel movement every morning and denies having any bowel accidents.  Patient states that his sensation for his bowel movements has continued to improve and he now can feel it coming.  2. Bladder: Patient continues to take the oxybutynin 5 mg 3 times daily as well as Flomax 0.4 nightly.  States that he has regular, controlled urinary voids.  Denies any leaking or accidents.  Is interested in possibly cutting down on his bladder medications.  3. Spasticity: Patient continues to have no issues with spasticity at this time.  4. Skin: Patient has no skin breakdown anywhere other than his initial surgical wound.  Wound was visualized today and has healed well via secondary intention.  Still a few small areas the proximal end of the incision that have not entirely healed.  5. Neuropathic pain: Patient denies any issues with neuropathic pain.  6. Equipment: Patient uses his power wheelchair although is seen in a transport chair in clinic today.  Patient has a tub bench that he uses regularly.  Also has a rolling walker that he uses for transfers as well as some limited walking, primarily with therapy.  7. Therapy: Patient is completed home health therapy and is currently transitioning to outpatient PT and OT here at the [facility].    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[employer],  installed cubicles and office furniture    OBJECTIVE  Therapeutic Activities  BITS in standing to work on overhead reaching  Stood with rw in front of pt, CGA provided for Therapist, art Paced:    Trial 1: 60% Accuracy, Size: 5, Reaction Time: 3.32s; Duration: 1 min, 18 Hits    Replicating images to work on drawing and reaching with RUE with upright standing posture for   Cues to stabilize self with RW  R foot noted to lift off ground when using LUE or reaching across body with RUE    Tracing task with image in center- 44% accuracy,   Focus on upward diagonal and vertical drawings as these were most challenging   73% accuracy     Screwing task with various sized screws with BUEs, good skill, rec doing at home    HEP  Supine on wedge and 2 pillows  Horizontal abduction stretch 90sec x5  Overhead abduction x10- snow angel to point of shoulder height  Protraction/retraction  Goal post ER stretch  Butterfly ER stretch      Total Treatment Time:  40 minutes    Education:  Topics:Disease Glass blower/designer Exercise  Therapist, nutritional  Education Provided to: patient and family  Education Type:Demonstration and Explanation  Response to education/teachback:Verbal understanding recieved    I reviewed the no-show/attendance policy with the patient and caregiver(s). The family is aware that they must call to cancel appointments more than 24 hours in advance. They are also aware that if they late cancel or no-show three times, we reserve the right to cancel their remaining appointments. This policy is in place to allow [place] to best serve the needs of our caseload.       I attest that I have reviewed the above information.  Signed: Earlie Counts, OT 12/27/2021 10:02 AM

## 2021-12-27 NOTE — Unmapped (Signed)
OUTPATIENT PHYSICAL THERAPY  DAILY PROGRESS NOTES    Patient Name: Kerry Mooney  Date of Birth:12-20-1980  Date: 12/27/2021  Session Number: 14 (4/10 to reassessment)  Therapy Diagnosis:   Encounter Diagnoses   Name Primary?    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    SCI Level: C2 ASIA D SCI    COVID Mask use: Patient wore a mask for the entire therapy session. and Patient and/or caregiver opted not to wear mask for duration or majority of session per updated clinic mask policy.      Assessment:   Today's session focused on right proximal hip strengthening and grounding of right LE with standing and reaching.  Also worked on long distance ambulation with  Bilateral trekking poles.  Patient plans to go and walk at  local mall to build endurance using trekking poles this week.  Improved right hip weight bearing and stability today.  Asked patient to bring in his trekking poles to therapy next session.    At this time, [name] continues to benefit from PT to maximize functional independence and resumption of pre-existing life roles.        Communication/consultation with other professionals: n/a today     Prior Objective Testing  Tests Eval (10/28/21) 11/26/21 11/29/21 Reassessment  12/13/21    TUG 15.9 seconds, RW   23.85 sec with no UE support  21.6 sec ( no AD)    5x STS 28.6 seconds  20.91 sec  15.04 sec    WISCI II Level 6 (walker, brace on R, Min A - SBA x 1) Level 12 - (two crutches, brace on right, no assist) Level 15 - Brace - Once Crutch , No Assistance > 10 M   10 MWT  TBD  0.62 m/s with walker 0.47 m/s 10 m/ 15.86 sec     0.63 m/s without the walker      Problem List: decreased UE and LE strength, impaired balance, decreased ROM, impaired posture, deconditioning, impaired functional mobility, impaired ADLs and edema    Plan     Next session:   Continue LE strengthening -  quadruped, tall kneeling for hip stability, long distance ambulation/endurance    Freq/Duration: per evaluation (10/28/21)  2x a week for 12 weeks     Home Exercise ID: [id number]    Goals:  Patient/Family Goals:I want to be strong enough to walk without the walker and make walking my primary means of getting around.     Short Term Goals:  In 6 weeks:  Patient will be able to properly demonstrate current HEP independently x1 in clinic to build upon functional gains in PT.  Met 11/29/21  Patient will ambulate 500 feet with RW, AFO donned on R LE, and decreased gait deviation to allow return to household/community ambulation. Progressing 250 feet with LC and AFOS  Patient will ascend and descend 3 stairs with CGA assistance, with  1 or no handrails and safe  technique demonstrating step through pattern in order to increase independence with household/community mobility.Met 11/29/21  Patient will be able to perform floor transfer with bilateral UE support and supervision assist.  Met 11/29/21  Patient will improve Timed Up and Go score to < 12 seconds to further reduce fall risk. Not Met 12/13/21  Patient will ambulate >1.0 meters/second to decrease fall risk and improve household/community ambulation Not Met 12/13/21  Patient will complete FGA to assess dynamic balance during gait.  TBA  Patient will complete .  Met 11/29/21      Long Term Goals:  12  weeks:  Patient will use ambulation with LRAD as primary means of mobility more than 60% of the time.   Patient will be able to ambulate 1,061ft with minimal gait deviations and AFO donned on R LE to return to community ambulation and demonstrate improved ambulation endurance.   Patient will ascend and descend 6 stairs with SBA assistance, with no handrails and safe technique demonstrating step through pattern in order to increase independence with household/community mobility.  Patient will be able to perform floor transfer with no external support and SBA.   -  Met  11/26/21  Patient will improve TUG score to < 10 seconds to further reduce falls risk. Today???s assessment is for specific pre or post testing only per MD request. Full PT evaluation and Skilled Physical Therapy treatment intervention have not been requested.      Subjective:   Pt states: i could 100% walk at the [facility]  HEP: updated today  Pain: no complaints     Reason for Referral/History of Present Condition/Onset of injury/exacerbation:   41 y.o. male presents with PMH of C2 ASIA D SCI from 58ft fall through attic (04/11/2021), Patient has a PMH of seizures, ankylosing spondylitis, TBI and crohns disease. Per MD note, patient was admitted to [facility] AIR from 10/18-10/24/2022, readmitted to [facility] on 05/03/21 for assessment of cervical wound due to drainage and concern for infection and readmitted to [facility] AIR from 06/01/21 to 06/16/21 before requiring transfer back to acute for wound debridement/washout. Patient was readmitted again to [facility] AIR from 06/25/21-07/18/21 before being transferred back to [facility] main for wound washout      Objective:      Pain:  no complaints  Falls: None    Due to limited visits of his insurance plan PT has cancelled 4 apts and transitioned his plan to PT 1 x week starting this week.         Ther Exer: 35 mins    Right proximal hip Stabilization and grounding while doing the following:     Left Seated march    Seated Reaching:  Left lateral reach  Left Cross body reach  Right Lateral Reach  Right cross body reach  Alternating overhead reach     Standing balance and grounding  Standing in off set stance with left foot on box  Right SLS with left step tap  Standing grounding on right with overhead reach       Amb x 8 .5 mins (277') walking and trekking poles  for balance.     There Act: 10 min     Up and down steps x 12 with trekking poles with  alternating reciprocal for up steps and Step to pattern going down     Up and down curb with trekking poles and CGA - SBA for 6 inch curb and 8 inch curb.  Home Exercise Program:    - sit<>stand with UE extended, feet staggered for L disavantaged, eccentric lowering emphasized   - at half wall for postural support:    - hip abd with glut med focus (2 UE support)    - hip flex (1 UE support)    - hip flex into extension (straight knee, 1 UE support)  - clam shell  - hip abd (straight leg)  - modified bird dog  - Postural Exercises:   Shoulder ER with hands behind head  Horizontal Abd/Add  Shoulder Flexion: I and Y  TA - Alternating Heel slides,  TA - Alternating Hip Abd/ Add,    Supine bicycle legs    [employer] [id number]      Treatment Rendered:    Total Treatment Time: 45 min   TE 35 min  TA 10 min     I attest that I have reviewed the above information.  Signed: Vira Blanco, PT, DPT  12/27/2021 12:56 PM

## 2021-12-29 NOTE — Unmapped (Signed)
OUTPATIENT PHYSICAL THERAPY  DAILY PROGRESS NOTES    Patient Name: Kerry Mooney  Date of Birth:17-Dec-1980  Date: 12/29/2021  Session Number: 15 (5/10 to reassessment)  Therapy Diagnosis:   Encounter Diagnoses   Name Primary?    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    SCI Level: C2 ASIA D SCI    COVID Mask use: Patient wore a mask for the entire therapy session. and Patient and/or caregiver opted not to wear mask for duration or majority of session per updated clinic mask policy.      Assessment:   Today's session focused on building endurance while  walking with trekking poles.  Floor transfers with no external support.    Going up and down steps and curbs with trekking poles.  Working on right hip and knee strengthening.  Patient will continue to benefit from PT to maximize functional Independence.        Communication/consultation with other professionals: n/a today     Prior Objective Testing  Tests Eval (10/28/21) 11/26/21 11/29/21 Reassessment  12/13/21    TUG 15.9 seconds, RW   23.85 sec with no UE support  21.6 sec ( no AD)    5x STS 28.6 seconds  20.91 sec  15.04 sec    WISCI II Level 6 (walker, brace on R, Min A - SBA x 1) Level 12 - (two crutches, brace on right, no assist)  Level 15 - Brace - Once Crutch , No Assistance > 10 M   10 MWT  TBD  0.62 m/s with walker 0.47 m/s 10 m/ 15.86 sec     0.63 m/s without the walker      Problem List: decreased UE and LE strength, impaired balance, decreased ROM, impaired posture, deconditioning, impaired functional mobility, impaired ADLs and edema    Plan     Next session:   Continue LE strengthening -  quadruped, tall kneeling for hip stability, long distance ambulation/endurance        Home Exercise ID: [id number]    Goals:  Patient/Family Goals:I want to be strong enough to walk without the walker and make walking my primary means of getting around.     Short Term Goals:  In 6 weeks:  Patient will be able to properly demonstrate current HEP independently x1 in clinic to build upon functional gains in PT.  Met 11/29/21  Patient will ambulate 500 feet with RW, AFO donned on R LE, and decreased gait deviation to allow return to household/community ambulation. Progressing 250 feet with LC and AFOS  Patient will ascend and descend 3 stairs with CGA assistance, with  1 or no handrails and safe technique demonstrating step through pattern in order to increase independence with household/community mobility.Met 11/29/21  Patient will be able to perform floor transfer with bilateral UE support and supervision assist.  Met 11/29/21  Patient will improve Timed Up and Go score to < 12 seconds to further  reduce fall risk. Not Met 12/13/21  Patient will ambulate >1.0 meters/second to decrease fall risk and improve household/community ambulation Not Met 12/13/21  Patient will complete FGA to assess dynamic balance during gait.  TBA  Patient will complete .  Met 11/29/21      Long Term Goals:  12  weeks:  Patient will use ambulation with LRAD as primary means of mobility more than 60% of the time.   Patient will be able to ambulate 1,070ft with minimal gait deviations and AFO donned on R LE to return to community ambulation and demonstrate improved ambulation endurance.   Patient will ascend and descend 6 stairs with SBA assistance, with no handrails and safe technique demonstrating step through pattern in order to increase independence with household/community mobility.  Patient will be able to perform floor transfer with no external support and SBA.   -  Met  11/26/21  Patient will improve TUG score to < 10 seconds to further reduce falls risk. Today???s assessment is for specific pre or post testing only per MD request. Full PT evaluation and Skilled Physical Therapy treatment intervention have not been requested.      Subjective:   Pt states: I have my crutches today.   HEP: updated today  Pain: no complaints     Reason for Referral/History of Present Condition/Onset of injury/exacerbation:   41 y.o. 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 Per MD note, patient was admitted to [facility] AIR from 10/18-10/24/2022, readmitted to [facility] on 05/03/21 for assessment of cervical wound due to drainage and concern for infection and readmitted to [facility] AIR from 06/01/21 to 06/16/21 before requiring transfer back to acute for wound debridement/washout. Patient was readmitted again to [facility] AIR from 06/25/21-07/18/21 before being transferred back to [facility] main for wound washout      Objective:      Pain:  no complaints  Falls: None      TE: 25 min     Nustep for Warm up x 10 min with UE and LE Avg SPM 82  Right LE There Ex:   Step Ups  Lateral Step ups   Right LE box Taps    Ambulation x 250 feet with trekking poles and S.         TA: 20 min     Stepping over obstacles with Trekking poles and cga     Picking up objects off the floor with SBA.           Floor transfers with no AD and no external support with slow pace and SBA.      Up and down steps x 12 with trekking poles with  alternating reciprocal for up steps and Step to pattern going down     Up and down curb with trekking poles and CGA - SBA for 6 inch curb and 8 inch curb.          Home Exercise Program:    - sit<>stand with UE extended, feet staggered for L disavantaged, eccentric lowering emphasized   - at half wall for postural support:    - hip abd with glut med focus (2 UE support)    - hip flex (1 UE support)    - hip flex into extension (straight knee, 1 UE support)  - clam shell  - hip abd (straight leg)  - modified bird dog  -  Postural Exercises:   Shoulder ER with hands behind head  Horizontal Abd/Add  Shoulder Flexion: I and Y  TA - Alternating Heel slides,  TA - Alternating Hip Abd/ Add,    Supine bicycle legs    [employer] [id number]      Treatment Rendered:    Total Treatment Time: 45 min   TE 25 min  TA 20 min     I attest that I have reviewed the above information.  Signed: Vira Blanco, PT, DPT  12/29/2021 10:14 AM

## 2022-01-03 NOTE — Unmapped (Signed)
OUTPATIENT OCCUPATIONAL THERAPY   Note Type: Discharge Note       Patient Name: Kerry Mooney  Date of Birth:September 03, 1980  Date of Visit: 01/03/2022  Visit #:  3/10 Toward Re-assessment, 10 total.  Date of Injury/Onset: Dec 2022  Date of Evaluation: 10/21/21  Referring Physician: Caren Griffins*  Reason for Referral: Eval and Treat  Plan of Care: 10/21/21-01/20/22  Encounter Diagnoses   Name Primary?    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 year old R handed male with history of C2 ASIA D SCI in Dec 2022 who presents with kyphotic posture, resulting in decreased shoulder ROM and pain with movement, and poor FMC, impairing participation in ADLs and IADLs.  This session, reassessed progress toward goals with plan on dc this date.  Pt did not meet L coordination goal, regressed since last attempt, however pt reporting functionally he is doing well with his hands.  Due to plateau in progress and no further goals, discharge this date.  Pt agreeable with plan.        Complexity:  Moderate Complexity Eval Code: Clinical decision making was of moderate complexity, as data from the client???s history, profile, detailed assessments, and consideration of several treatment options.  Patient presents with comorbidities that affect occupational performance.  Minimal to moderate modification of tasks is necessary.        Long Term Goals:   1. In 9 sessions, patient will perform upgraded home exercise program independently  to max IND with ADLs and IADLs. MET 12/16/21  2. In 9 sessions, patient will demonstrate subjective improvement in writing quality from 55% to 70% MET 12/16/21- 65-70%  3. In 9 sessions, patient will demonstrate 1 adapted strategy to assist with clothing management (shirt) MET 12/16/21  4. In 9 sessions, patient will demonstrate no reports of pain while placing an item at/above shoulder height to max participation in household chores Progress 12/16/21/ NOT MET 01/03/22  5. In 9 sessions, patient will participate in simulated driving screen MET 0/9/81 rec on road test, DMV   6. In 9 sessions, patient will demonstrate improved L coordination with 9hpt to place all pegs in board in 60s or less  Progress 12/16/21 64min15sec/ NOT MET 01/03/22    Patient in agreement with plan of care?: yes    Prognosis for goal achievement: Good due to good motivation, supportive family, and overall health status.    PLAN  Pt. will participate in:  Therapeutic Exercise  Therapeutic Acitivity  Manual Therapy  Neuromuscular Re-education  Orthotic management  Self-care home training      Planned frequency and duration of treatment:  Discharge    SUBJECTIVE:  Doing HEP with fingers, rice bin    History of Present Condition: Per referring physician's note:  History of Present Illness: Patient is a 41 y.o. year old male seen following his AIR stay in 06/2021 and 07/2021. He was admitted initially to [facility] following a fall 10 FT through a ceiling landing on exercise equipment. 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 PLIF C2-4, l1-2 decompression laminectomy with reduction of fx T12-L3 PLA with pedicle screw fixation and local autographing. PMH: seizures, ankylosing spondylitis, TBI and crohns disease. Patient was admitted to [facility] AIR from 10/18-10/24/2022, readmitted to [facility] on  05/03/21 for assessment of cervical wound due to drainage and concern for infection and readmitted to [facility] AIR from 06/01/21 to 06/16/21 before requiring transfer back to acute for wound debridement/ washout.Patient was readmitted again to [facility] AIR from 06/25/21-07/18/21 before being transferred back to [facility] main for wound washout.    At the time of his discharge, patient had improved bladder sensation and was performing timed toileting.  He was discharged on oxybutynin 5 mg 3 times daily as well as Flomax 0.4 mg nightly.  He had also had improved sensation and control with his bowels and was no longer on a formalized bowel program.  He was contact-guard assist with his transfers using a rolling walker and mod I to set up with most ADLs.  He was still primarily using a power wheelchair for mobility.     SCI Injury/level: C2 ASIA D  Interval History:   Since discharge from inpatient, patient has been seen by the infectious disease clinic and has completed all necessary antibiotics.  He is also followed closely by plastic surgery in clinic.  Based on their last note, surgical site continues to heal well.  1. Bowel: Patient previously had been on Metamucil daily, MiraLAX and 2 tabs of senna at noon, Colace twice daily, and Enemeez nightly.  Since return to home, patient has been seen by his GI physician and currently takes Metamucil as needed, no longer takes MiraLAX or Colace, takes 1 tab of senna daily, and no longer requires Enemeez for bowel movements.  States that he essentially has a bowel movement every morning and denies having any bowel accidents.  Patient states that his sensation for his bowel movements has continued to improve and he now can feel it coming.  2. Bladder: Patient continues to take the oxybutynin 5 mg 3 times daily as well as Flomax 0.4 nightly.  States that he has regular, controlled urinary voids.  Denies any leaking or accidents.  Is interested in possibly cutting down on his bladder medications.  3. Spasticity: Patient continues to have no issues with spasticity at this time.  4. Skin: Patient has no skin breakdown anywhere other than his initial surgical wound.  Wound was visualized today and has healed well via secondary intention.  Still a few small areas the proximal end of the incision that have not entirely healed.  5. Neuropathic pain: Patient denies any issues with neuropathic pain.  6. Equipment: Patient uses his power wheelchair although is seen in a transport chair in clinic today.  Patient has a tub bench that he uses regularly.  Also has a rolling walker that he uses for transfers as well as some limited walking, primarily with therapy.  7. Therapy: Patient is completed home health therapy and is currently transitioning to outpatient PT and OT here at the [facility].    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[employer], [employer] and office furniture    OBJECTIVE  Physical Performance Testing  Pt arrived for discharge and reported he did not need to do whole session  Reassessed  progress on 9HPT- L hand 33min37s  Regressed coordination since last attempt    Self Care  Reviewed HEP for B hands  Pt reviewed all hand exercises he is doing, OT advised on continuing to upgrade tasks with use of pens, balls, other functional movements  Doing sensory rice bin at home      Total Treatment Time:  13 minutes    Education:  Topics:Disease Glass blower/designer Exercise  Therapist, nutritional  Education Provided to: patient and family  Education Type:Demonstration and Explanation  Response to education/teachback:Verbal understanding recieved    I reviewed the no-show/attendance policy with the patient and caregiver(s). The family is aware that they must call to cancel appointments more than 24 hours in advance. They are also aware that if they late cancel or no-show three times, we reserve the right to cancel their remaining appointments. This policy is in place to allow [place] to best serve the needs of our caseload.       I attest that I have reviewed the above information.  Signed: Earlie Counts, OT 01/03/2022 9:56 AM

## 2022-01-03 NOTE — Unmapped (Addendum)
[facility] Clinical Assessment & Refill Coordination Note    **Finished Humira loading dose - now starting maintenance (q7d) doseClayton Mooney, DOB: May 09, 1981  Phone: [phone] (home)     All above HIPAA information was verified with patient.     Was a Nurse, learning disability used for this call? No    Specialty Medication(s):   Inflammatory Disorders: Humira     Current Outpatient Medications   Medication Sig Dispense Refill    ascorbic acid, vitamin C, (ASCORBIC ACID) 500 MG tablet Take 1 tablet (500 mg total) by mouth daily.      azaTHIOprine (IMURAN) 50 mg tablet TAKE 3 TABLETS BY MOUTH EVERY DAY 270 tablet 0    busPIRone (BUSPAR) 15 MG tablet TAKE 1 TABLET (15 MG TOTAL) BY MOUTH THREE (3) TIMES A DAY. 270 tablet 3    ferrous sulfate 325 (65 FE) MG tablet Take 1 tablet (325 mg total) by mouth in the morning.      HUMIRA PEN CITRATE FREE 40 MG/0.4 ML Inject the contents of 1 pen (40 mg total) under the skin every seven (7) days. 4 each 5    HUMIRA PEN CITRATE FREE STARTER PACK FOR CROHN'S/UC/HS 3 X 80 MG/0.8 ML Inject the contents of 2 pens (160 mg total) day 1, then inject the contents of 1 pen (80 mg total) on day 15 for loading dose. 3 each 0    levETIRAcetam (KEPPRA) 1000 MG tablet Take 1 tablet (1,000 mg total) by mouth every twelve (12) hours. 180 tablet 3    methocarbamoL (ROBAXIN) 500 MG tablet TAKE 1 TABLET (500 MG TOTAL) BY MOUTH TWO (2) TIMES A DAY AS NEEDED FOR PAIN. 90 tablet 1    midazolam 5 mg/spray (0.1 mL) Spry Give 1 spray (5mg ) into 1 nostril. If no response in 10 min, may give second spray in other nostril. Do not give second dose if patient has trouble breathing or excessive sedation. Max 2 doses/seizure episode, 1 episode every 3 days or 5 episodes/month. 2 each 4    oxybutynin (DITROPAN) 5 MG tablet Take 1 tablet (5 mg total) by mouth Three (3) times a day. (Patient taking differently: Take 1 tablet (5 mg total) by mouth in the morning.) 270 tablet 3 tamsulosin (FLOMAX) 0.4 mg capsule Take 1 capsule (0.4 mg total) by mouth daily. (Patient taking differently: Take 1 capsule (0.4 mg total) by mouth daily. Evening) 90 capsule 3     No current facility-administered medications for this visit.        Changes to medications: Kerry Mooney reports no changes at this time.    Allergies   Allergen Reactions    Infliximab      Seizure activity  Other reaction(s): Other (See Comments)  Seizures       Changes to allergies: No    SPECIALTY MEDICATION ADHERENCE     Humira 40  mg/0.33mL : 0 days of medicine on hand       Medication Adherence    Patient reported X missed doses in the last month: 0  Specialty Medication: Humira 40mg /0.57mL - 1 q7d  Informant: patient          Specialty medication(s) dose(s) confirmed: Regimen is correct and unchanged.     Are there any concerns with adherence? No    Adherence counseling provided? Not needed    CLINICAL MANAGEMENT AND INTERVENTION      Clinical Benefit Assessment:    Do you feel the  medicine is effective or helping your condition? Patient declined to answer    Clinical Benefit counseling provided? Not needed    Adverse Effects Assessment:    Are you experiencing any side effects? No    Are you experiencing difficulty administering your medicine? No    Quality of Life Assessment:    Quality of Life    Rheumatology  Oncology  Dermatology  Cystic Fibrosis          How many days over the past month did your Crohn's  keep you from your normal activities? For example, brushing your teeth or getting up in the morning. 0    Have you discussed this with your provider? Not needed    Acute Infection Status:    Acute infections noted within Epic:  No active infections  Patient reported infection: None    Therapy Appropriateness:    Is therapy appropriate and patient progressing towards therapeutic goals? Yes, therapy is appropriate and should be continued    DISEASE/MEDICATION-SPECIFIC INFORMATION      For patients on injectable medications: Patient currently has 0 doses left.  Next injection is scheduled for 01/14/2022.    PATIENT SPECIFIC NEEDS     Does the patient have any physical, cognitive, or cultural barriers? No    Is the patient high risk? No    Does the patient require a Care Management Plan? No     SOCIAL DETERMINANTS OF HEALTH     At the [facility], we have learned that life circumstances - like trouble affording food, housing, utilities, or transportation can affect the health of many of our patients.   That is why we wanted to ask: are you currently experiencing any life circumstances that are negatively impacting your health and/or quality of life? Patient declined to answer    Social Determinants of Health     Financial Resource Strain: Low Risk     Difficulty of Paying Living Expenses: Not hard at all   Internet Connectivity: Not on file   Food Insecurity: No Food Insecurity    Worried About Programme researcher, broadcasting/film/video in the Last Year: Never true    Barista in the Last Year: Never true   Tobacco Use: Low Risk     Smoking Tobacco Use: Never    Smokeless Tobacco Use: Never    Passive Exposure: Not on file   Housing/Utilities: Low Risk     Within the past 12 months, have you ever stayed: outside, in a car, in a tent, in an overnight shelter, or temporarily in someone else's home (i.e. couch-surfing)?: No    Are you worried about losing your housing?: No    Within the past 12 months, have you been unable to get utilities (heat, electricity) when it was really needed?: No   Alcohol Use: Not on file   Transportation Needs: No Transportation Needs    Lack of Transportation (Medical): No    Lack of Transportation (Non-Medical): No   Substance Use: Not on file   Health Literacy: Low Risk     : Never   Physical Activity: Not on file   Interpersonal Safety: Not on file   Stress: Not on file   Intimate Partner Violence: Not on file   Depression: Not at risk    PHQ-2 Score: 2   Social Connections: Not on file       Would you be willing to receive help with any of the needs that you have identified  today? Not applicable       SHIPPING     Specialty Medication(s) to be Shipped:   Inflammatory Disorders: Humira    Other medication(s) to be shipped: No additional medications requested for fill at this time     Changes to insurance: No    Delivery Scheduled: Yes, Expected medication delivery date: 01/06/2022.     Medication will be delivered via Same Day Courier to the confirmed prescription address in [facility].    The patient will receive a drug information handout for each medication shipped and additional FDA Medication Guides as required.  Verified that patient has previously received a Conservation officer, historic buildings and a Surveyor, mining.    The patient or caregiver noted above participated in the development of this care plan and knows that they can request review of or adjustments to the care plan at any time.      All of the patient's questions and concerns have been addressed.    Teofilo Pod   [facility] Specialty Pharmacist

## 2022-01-05 NOTE — Unmapped (Incomplete)
OUTPATIENT PHYSICAL THERAPY  DAILY PROGRESS NOTES    Patient Name: Kerry Mooney  Date of Birth:April 24, 1981  Date: 01/05/2022  Session Number: 16 (6/10 to reassessment)  Therapy Diagnosis:   Encounter Diagnoses   Name Primary?    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    SCI Level: C2 ASIA D SCI    COVID Mask use: Patient wore a mask for the entire therapy session. and Patient and/or caregiver opted not to wear mask for duration or majority of session per updated clinic mask policy.      Assessment:   Today's session focused on building endurance while  walking with trekking poles.  Floor transfers with no external support.    Going up and down steps and curbs with trekking poles.  Working on right hip and knee strengthening.  Patient will continue to benefit from PT to maximize functional Independence.        Communication/consultation with other professionals: n/a today     Prior Objective Testing  Tests Eval (10/28/21) 11/26/21 11/29/21 Reassessment  12/13/21    TUG 15.9 seconds, RW   23.85 sec with no UE support  21.6 sec ( no AD)    5x STS 28.6 seconds  20.91 sec  15.04 sec    WISCI II Level 6 (walker, brace on R, Min A - SBA x 1) Level 12 - (two crutches, brace on right, no assist)  Level 15 - Brace - Once Crutch , No Assistance > 10 M   10 MWT  TBD  0.62 m/s with walker 0.47 m/s 10 m/ 15.86 sec     0.63 m/s without the walker      Problem List: decreased UE and LE strength, impaired balance, decreased ROM, impaired posture, deconditioning, impaired functional mobility, impaired ADLs and edema    Plan     Next session:   Continue LE strengthening -  quadruped, tall kneeling for hip stability, long distance ambulation/endurance with trekking poles    Home Exercise ID: [id number]    Goals:  Patient/Family Goals:I want to be strong enough to walk without the walker and make walking my primary means of getting around.     Goals Met:    Patient will be able to properly demonstrate current HEP independently x1 in clinic to build upon functional gains in PT.  Met 11/29/21  Patient will ambulate 500 feet with RW, AFO donned on R LE, and decreased gait deviation to allow return to household/community ambulation. Met 6/68/23  Patient will ascend and descend 3 stairs with CGA assistance, with  1 or no handrails and safe technique demonstrating step through pattern in order to increase independence with household/community mobility.Met 11/29/21  Patient will be able to perform floor transfer with bilateral UE support and supervision assist.  Met 11/29/21  Patient will be able to perform floor transfer with no external support and SBA.   -  Met  11/26/21  Patient will complete .  Met 11/29/21      Long Term Goals:  Patient will improve Timed Up and Go score to < 12 seconds to further reduce fall risk. Not Met 12/13/21  Patient will ambulate >1.0 meters/second to decrease fall risk and improve household/community ambulation Not Met 12/13/21  Patient will complete FGA to assess dynamic balance during gait.  TBA  Patient will use ambulation with LRAD as primary means of mobility more than 60% of the time.   Patient will be able to ambulate 1,052ft with minimal gait deviations and AFO donned on R LE to return to community ambulation and demonstrate improved ambulation endurance.   Patient will ascend and descend 6 stairs with SBA assistance, with no handrails and safe technique demonstrating step through pattern in order to increase independence with household/community mobility.    Patient will improve TUG score to < 10 seconds to further reduce falls risk. Today???s assessment is for specific pre or post testing only per MD request. Full PT evaluation and Skilled Physical Therapy treatment intervention have not been requested.      Subjective:   Pt states: I have been doing pretty well.  HEP: updated today  Pain: no complaints     Reason for Referral/History of Present Condition/Onset of injury/exacerbation:   41 y.o. male presents with PMH of C2 ASIA D SCI from 15ft fall through attic (04/11/2021), Patient has a PMH of seizures, ankylosing spondylitis, TBI and crohns disease. Per MD note, patient was admitted to [facility] from 10/18-10/24/2022, readmitted to [facility] on 05/03/21 for assessment of cervical wound due to drainage and concern for infection and readmitted to [facility] from 06/01/21 to 06/16/21 before requiring transfer back to acute for wound debridement/washout. Patient was readmitted again to [facility] AIR from 06/25/21-07/18/21 before being transferred back to [facility] main for wound washout      Objective:      Pain:  no complaints  Falls: None      TE:  15 min     Amb 1235 feet  with [place] in 14 min 54 sec with 2 standing rest breaks and RPE of 6-7   Recovery time 2 min 30 sec       TA: 15 min       Floor transfers with no AD and no external support with slow pace and SBA.      Up and down steps x 12 with trekking poles with  alternating reciprocal for up steps and Step to pattern going down     Up and down curb with trekking poles and CGA - SBA for 6 inch curb and 8 inch curb.      PPTM 15 min     Berg Balance Scale    SITTING TO STANDING (4)  INSTRUCTIONS: Please stand up. Try not to use your hand for support.  ( ) 4 able to stand without using hands and stabilize independently  ( ) 3 able to stand independently using hands  ( ) 2 able to stand using hands after several tries  ( ) 1 needs minimal aid to stand or stabilize  ( ) 0 needs moderate or maximal assist to stand    STANDING UNSUPPORTED (4)  INSTRUCTIONS: Please stand for two minutes without holding on.  ( ) 4 able to stand safely for 2 minutes  ( ) 3 able to stand 2 minutes with supervision  ( ) 2 able to stand 30 seconds unsupported  ( )  1 needs several tries to stand 30 seconds unsupported  ( ) 0 unable to stand 30 seconds unsupported  If a subject is able to stand 2 minutes unsupported, score full points for sitting unsupported. Proceed to item #4.    SITTING WITH BACK UNSUPPORTED BUT FEET SUPPORTED ON FLOOR OR ON A STOOL (4)  INSTRUCTIONS: Please sit with arms folded for 2 minutes.  ( ) 4 able to sit safely and securely for 2 minutes  ( ) 3 able to sit 2 minutes under supervision  ( ) 2 able to able to sit 30 seconds  ( ) 1 able to sit 10 seconds  ( ) 0 unable to sit without support 10 seconds    STANDING TO SITTING (4)  INSTRUCTIONS: Please sit down.  ( ) 4 sits safely with minimal use of hands  ( ) 3 controls descent by using hands  ( ) 2 uses back of legs against chair to control descent  ( ) 1 sits independently but has uncontrolled descent  ( ) 0 needs assist to sit    TRANSFERS (4)  INSTRUCTIONS: Arrange chair(s) for pivot transfer. Ask subject to transfer one way toward a seat with armrests and one way  toward a seat without armrests. You may use two chairs (one with and one without armrests) or a bed and a chair.  ( ) 4 able to transfer safely with minor use of hands  ( ) 3 able to transfer safely definite need of hands  ( ) 2 able to transfer with verbal cuing and/or supervision  ( ) 1 needs one person to assist  ( ) 0 needs two people to assist or supervise to be safe    STANDING UNSUPPORTED WITH EYES CLOSED (4)  INSTRUCTIONS: Please close your eyes and stand still for 10 seconds.  ( ) 4 able to stand 10 seconds safely  ( ) 3 able to stand 10 seconds with supervision  ( ) 2 able to stand 3 seconds  ( ) 1 unable to keep eyes closed 3 seconds but stays safely  ( ) 0 needs help to keep from falling    STANDING UNSUPPORTED WITH FEET TOGETHER (4)  INSTRUCTIONS: Place your feet together and stand without holding on.  ( ) 4 able to place feet together independently and stand 1 minute safely  ( ) 3 able to place feet together independently and stand 1 minute with supervision  ( ) 2 able to place feet together independently but unable to hold for 30 seconds  ( ) 1 needs help to attain position but able to stand 15 seconds feet together  ( ) 0 needs help to attain position and unable to hold for 15 seconds    REACHING FORWARD WITH OUTSTRETCHED ARM WHILE STANDING (2) ROM Limite  INSTRUCTIONS: Lift arm to 90 degrees. Stretch out your fingers and reach forward as far as you can. (Examiner places a ruler at  the end of fingertips when arm is at 90 degrees. Fingers should not touch the ruler while reaching forward. The recorded measure is  the distance forward that the fingers reach while the subject is in the most forward lean position. When possible, ask subject to use  both arms when reaching to avoid rotation of the trunk.)  ( ) 4 can reach forward confidently 25 cm (10 inches)  ( ) 3 can reach forward 12 cm (5 inches)  ( ) 2 can reach forward 5 cm (2 inches)  ( )  1 reaches forward but needs supervision  ( ) 0 loses balance while trying/requires external support    PICK UP OBJECT FROM THE FLOOR FROM A STANDING POSITION (4)  INSTRUCTIONS: Pick up the shoe/slipper, which is in front of your feet.  ( ) 4 able to pick up slipper safely and easily  ( ) 3 able to pick up slipper but needs supervision  ( ) 2 unable to pick up but reaches 2-5 cm(1-2 inches) from slipper and keeps balance independently  ( ) 1 unable to pick up and needs supervision while trying  ( ) 0 unable to try/needs assist to keep from losing balance or falling    TURNING TO LOOK BEHIND OVER LEFT AND RIGHT SHOULDERS WHILE STANDING (2)  ROM   INSTRUCTIONS: Turn to look directly behind you over toward the left shoulder. Repeat to the right. (Examiner may pick an object  to look at directly behind the subject to encourage a better twist turn.)  ( ) 4 looks behind from both sides and weight shifts well  ( ) 3 looks behind one side only other side shows less weight shift  ( ) 2 turns sideways only but maintains balance  ( ) 1 needs supervision when turning  ( ) 0 needs assist to keep from losing balance or falling    TURN 360 DEGREES (2)  INSTRUCTIONS: Turn completely around in a full circle. Pause. Then turn a full circle in the other direction.  ( ) 4 able to turn 360 degrees safely in 4 seconds or less  ( ) 3 able to turn 360 degrees safely one side only 4 seconds or less  ( ) 2 able to turn 360 degrees safely but slowly  ( ) 1 needs close supervision or verbal cuing  ( ) 0 needs assistance while turning    PLACE ALTERNATE FOOT ON STEP OR STOOL WHILE STANDING UNSUPPORTED (4)  INSTRUCTIONS: Place each foot alternately on the step/stool. Continue until each foot has touched the step/stool four times.  ( ) 4 able to stand independently and safely and complete 8 steps in 20 seconds  ( ) 3 able to stand independently and complete 8 steps in > 20 seconds  ( ) 2 able to complete 4 steps without aid with supervision  ( ) 1 able to complete > 2 steps needs minimal assist  ( ) 0 needs assistance to keep from falling/unable to try    STANDING UNSUPPORTED ONE FOOT IN FRONT (3)  INSTRUCTIONS: (DEMONSTRATE TO SUBJECT) Place one foot directly in front of the other. If you feel that you cannot place  your foot directly in front, try to step far enough ahead that the heel of your forward foot is ahead of the toes of the other foot. (To  score 3 points, the length of the step should exceed the length of the other foot and the width of the stance should approximate the  subject???s normal stride width.)  ( ) 4 able to place foot tandem independently and hold 30 seconds  ( ) 3 able to place foot ahead independently and hold 30 seconds  ( ) 2 able to take small step independently and hold 30 seconds  ( ) 1 needs help to step but can hold 15 seconds  ( ) 0 loses balance while stepping or standing    STANDING ON ONE LEG (1)  INSTRUCTIONS: Stand on one leg as long as you can without holding on.  ( )  4 able to lift leg independently and hold > 10 seconds  ( ) 3 able to lift leg independently and hold 5-10 seconds  ( ) 2 able to lift leg independently and hold L 3 seconds  ( ) 1 tries to lift leg unable to hold 3 seconds but remains standing independently.  ( ) 0 unable to try of needs assist to prevent fall    (48) TOTAL SCORE (Maximum = 56)  Interpretation:   41-56 = low fall risk  21-40 = medium fall risk  0 -20 = high fall risk  A change of 8 points is required to reveal a genuine change in function between 2 assessments.           Home Exercise Program:    - sit<>stand with UE extended, feet staggered for L disavantaged, eccentric lowering emphasized   - at half wall for postural support:    - hip abd with glut med focus (2 UE support)    - hip flex (1 UE support)    - hip flex into extension (straight knee, 1 UE support)  - clam shell  - hip abd (straight leg)  - modified bird dog  - Postural Exercises:   Shoulder ER with hands behind head  Horizontal Abd/Add  Shoulder Flexion: I and Y  TA - Alternating Heel slides,  TA - Alternating Hip Abd/ Add,    Supine bicycle legs    [employer] [id number]      Total Treatment Time: 45 min   TE: 15 min   PPTM:   TA 15 min      I attest that I have reviewed the above information.  Signed: Vira Blanco, PT, DPT  01/05/2022 10:21 AM walkway width or reaches and touches the wall.    2. CHANGE IN GAIT SPEED:  ***  Instructions: Begin walking at your normal pace (for 1.5 m [5 ft]). When I tell you ???go,??? walk as fast as you can (for 1.5 m [5 ft]). When I tell you ???slow,??? walk as slowly as you can (for 1.5 m [5 ft]).  Grading: Loraine Leriche the highest category that applies.  (3) Normal--Able to smoothly change walking speed without loss of balance or gait deviation. Shows a significant difference in walking speeds between normal, fast, and slow speeds. Deviates no more than 15.24 cm (6 in) outside of the 30.48-cm (12-in) walkway width.  (2) Mild impairment--Is able to change speed but demonstrates mild gait deviations, deviates 15.24-25.4 cm (6-10 in) outside of the 30.48-cm (12-in) walkway width, or no gait deviations but unable to achieve a significant change in velocity, or uses an  assistive device.  (1) Moderate impairment--Makes only minor adjustments to walking speed, or accomplishes a change in speed with significant gait deviations, deviates 25.4-38.1 cm (10-15 in) outside the 30.48-cm (12-in) walkway width, or changes speed but loses balance but is able to recover and continue walking.  (0) Severe impairment--Cannot change speeds, deviates greater than 38.1 cm (15 in) outside 30.48-cm (12-in) walkway width, or loses balance and has to reach for wall or be caught.    3. GAIT WITH HORIZONTAL HEAD TURNS:  ***  Instructions: Walk from here to the next mark 6 m (20 ft) away. Begin walking at your normal pace. Keep walking straight; after 3 steps, turn your head to the right and keep walking straight while looking to the right. After 3 more steps, turn your head to the left and keep walking straight while looking  left. Continue alternating looking right and left every 3 steps until you have completed 2 repetitions in each direction.  Grading: Loraine Leriche the highest category that applies.  (3) Normal--Performs head turns smoothly with no change in gait. Deviates no more than 15.24 cm (6 in) outside 30.48-cm (12-in) walkway width.  (2) Mild impairment--Performs head turns smoothly with slight change in gait velocity (eg, minor disruption to smooth gait path), deviates 15.24-25.4 cm (6-10 in) outside 30.48-cm (12-in) walkway width, or uses an assistive device.  (1) Moderate impairment--Performs head turns with moderate change in gait velocity, slows down, deviates 25.4-38.1 cm (10-15 in) outside 30.48-cm (12-in) walkway width but recovers, can continue to walk.  (0) Severe impairment--Performs task with severe disruption of gait (eg, staggers 38.1 cm [15 in] outside 30.48-cm (12-in) walkway width, loses balance, stops, or reaches for wall).    4. GAIT WITH VERTICAL HEAD TURNS:  ***  Instructions: Walk from here to the next mark (6 m [20 ft]). Begin walking at your normal pace. Keep walking straight; after 3 steps, tip your head up and keep walking straight while looking up. After 3 more steps, tip your head down, keep walking straight while looking down. Continue alternating looking up and down every 3 steps until you have completed 2 repetitions in each direction.  Grading: Loraine Leriche the highest category that applies.  (3) Normal--Performs head turns with no change in gait. Deviates no more than 15.24 cm (6 in) outside 30.48-cm (12-in) walkway width.  (2) Mild impairment--Performs task with slight change in gait velocity (eg, minor disruption to smooth gait path), deviates 15.24-25.4 cm (6-10 in) outside 30.48-cm (12-in) walkway width or uses assistive device.  (1) Moderate impairment--Performs task with moderate change in gait velocity, slows down, deviates 25.4-38.1 cm (10-15 in) outside 30.48-cm (12-in) walkway width but recovers, can continue to walk.  (0) Severe impairment--Performs task with severe disruption of gait (eg, staggers 38.1 cm [15 in] outside 30.48-cm (12-in) walkway width, loses balance, stops, reaches for wall).    5. GAIT AND PIVOT TURN: ***  Instructions: Begin with walking at your normal pace. When I tell you, ???turn and stop,??? turn as quickly as you can to face the opposite direction and stop.  Grading: Loraine Leriche the highest category that applies.  (3) Normal--Pivot turns safely within 3 seconds and stops quickly with no loss of balance.  (2) Mild impairment--Pivot turns safely in 3 seconds and stops with no loss of balance, or pivot turns safely within 3 seconds and stops with mild imbalance, requires small steps to catch balance.  (1) Moderate impairment--Turns slowly, requires verbal cueing, or requires several small steps to catch balance following turn and stop.  (0)Severe impairment--Cannot turn safely, requires assistance to turn and stop.    6. STEP OVER OBSTACLE:  ***  Instructions: Begin walking at your normal speed. When you come to the shoe box, step over it, not around it, and keep walking.  Grading: Loraine Leriche the highest category that applies.  (3) Normal--Is able to step over 2 stacked shoe boxes taped together (22.86 cm [9 in] total height) without changing gait speed; no evidence of imbalance.  (2) Mild impairment--Is able to step over one shoe box (11.43 cm [4.5 in] total height) without changing gait speed; no evidence of imbalance.  (1) Moderate impairment--Is able to step over one shoe box (11.43 cm [4.5 in] total height) but must slow down and adjust steps to clear box safely. May require verbal cueing.  (0) Severe impairment--Cannot perform without  assistance.     7. GAIT WITH NARROW BASE OF SUPPORT:  ***  Instructions: Walk on the floor with arms folded across the chest, feet aligned heel to toe in tandem for a distance of 3.6 m [12 ft]. The number of steps taken in a straight line are counted for a maximum of 10 steps.  Grading: Loraine Leriche the highest category that applies.  (3) Normal--Is able to ambulate for 10 steps heel to toe with no staggering.  (2) Mild impairment--Ambulates 7-9 steps.  (1) Moderate impairment--Ambulates 4-7 steps.  (0) Severe impairment--Ambulates less than 4 steps heel to toe or cannot perform without assistance.    8. GAIT WITH EYES CLOSED:  ***  Instructions: Walk at your normal speed from here to the next mark (6 m[20 ft]) with your eyes closed.  Grading: Loraine Leriche the highest category that applies.  (3) Normal--Walks 6 m (20 ft), no assistive devices, good speed, no evidence of imbalance, normal gait pattern, deviates no more than 15.24 cm (6 in) outside 30.48-cm (12-in) walkway width. Ambulates 6 m (20 ft) in less than 7 seconds.  (2) Mild impairment--Walks 6 m (20 ft), uses assistive device, slower speed, mild gait deviations, deviates 15.24-25.4 cm (6-10 in) outside 30.48-cm (12-in) walkway width. Ambulates 6 m (20 ft) in less than 9 seconds but greater than 7 seconds.  (1) Moderate impairment--Walks 6 m (20 ft), slow speed, abnormal gait pattern, evidence for imbalance, deviates 25.4-38.1 cm (10-15 in) outside 30.48-cm (12-in) walkway width. Requires more than 9 seconds to ambulate 6 m (20 ft).  (0) Severe impairment--Cannot walk 6 m (20 ft) without assistance, severe gait deviations or imbalance, deviates greater than 38.1 cm (15 in) outside 30.48-cm (12-in) walkway width or will not attempt task.    9. AMBULATING BACKWARDS:  ***  Instructions: Walk backwards until I tell you to stop.  Grading: Loraine Leriche the highest category that applies.  (3) Normal--Walks 6 m (20 ft), no assistive devices, good speed, no evidence for imbalance, normal gait pattern, deviates no more than 15.24 cm (6 in) outside 30.48-cm (12-in) walkway width.  (2) Mild impairment--Walks 6 m (20 ft), uses assistive device, slower speed, mild gait deviations, deviates 15.24-25.4 cm (6-10 in) outside 30.48-cm (12-in) walkway width.  (1) Moderate impairment--Walks 6 m (20 ft), slow speed, abnormal gait pattern, evidence for imbalance, deviates 25.4-38.1 cm (10-15 in) outside 30.48-cm (12-in) walkway width.  (0) Severe impairment--Cannot walk 6 m (20 ft) without assistance, severe gait deviations or imbalance, deviates greater than 38.1 cm (15 in) outside 30.48-cm (12-in) walkway width or will not attempt task.    10. STEPS:  ***  Instructions: Walk up these stairs as you would at home (ie, using the rail if necessary). At the top turn around and walk down.  Grading: Loraine Leriche the highest category that applies.  (3) Normal--Alternating feet, no rail.  (2) Mild impairment--Alternating feet, must use rail.  (1) Moderate impairment--Two feet to a stair; must use rail.  (0) Severe impairment--Cannot do safely.    TOTAL SCORE: ***/30  Physical Therapy . Volume 84 . Number 10 . October 2004 Wrisley et al . [id number]   TA: 20 min     Stepping over obstacles with Trekking poles and cga     Picking up objects off the floor with SBA.           Floor transfers with no AD and no external support with slow pace and SBA.      Up and down steps x  12 with trekking poles with  alternating reciprocal for up steps and Step to pattern going down     Up and down curb with trekking poles and CGA - SBA for 6 inch curb and 8 inch curb.          Home Exercise Program:    - sit<>stand with UE extended, feet staggered for L disavantaged, eccentric lowering emphasized   - at half wall for postural support:    - hip abd with glut med focus (2 UE support)    - hip flex (1 UE support)    - hip flex into extension (straight knee, 1 UE support)  - clam shell  - hip abd (straight leg)  - modified bird dog  - Postural Exercises:   Shoulder ER with hands behind head  Horizontal Abd/Add  Shoulder Flexion: I and Y  TA - Alternating Heel slides,  TA - Alternating Hip Abd/ Add,    Supine bicycle legs    [employer] [id number]      Treatment Rendered:    Total Treatment Time: 45 min   TE 25 min  TA 20 min     I attest that I have reviewed the above information.  Signed: Vira Blanco, PT, DPT  01/05/2022 10:21 AM

## 2022-01-06 MED FILL — HUMIRA PEN CITRATE FREE 40 MG/0.4 ML: SUBCUTANEOUS | 28 days supply | Qty: 4 | Fill #0
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
## 2022-01-13 ENCOUNTER — Ambulatory Visit: Admit: 2022-01-13 | Discharge: 2022-01-14 | Payer: PRIVATE HEALTH INSURANCE

## 2022-01-13 DIAGNOSIS — S21209S Unspecified open wound of unspecified back wall of thorax without penetration into thoracic cavity, sequela: Principal | ICD-10-CM

## 2022-01-13 NOTE — Unmapped (Signed)
[facility] Plastic Surgery  [place] PA-C    HPI  Kerry Mooney is a 41 y.o. male who suffered a 10 ft fall 04/11/21 resulting in multiple spinal fractures & acute spinal cord injury s/p PLIF C2-4, L1-2 decompression laminectomy with reduction of fx T12-L3 PLA with pedicle screw fixation and local autographing on 04/12/21 at [facility], who then had a dehiscence of his incision. He underwent previous debridement and VAC placement by Neurosurgery on 05/20/21 followed by delayed closure by Plastics. His postoperative course was complicated by dehiscence, and he underwent debridement and VAC placement on 07/19/21. He presents for a wound check.     Interval History:   Overall the patient is doing well. The mother was present today for evaluation. Overall the patient continues to show slow improvement with healing. The patient unfortunately has been symptomatic with his Crohns Disease and has completed his loading doses of Humera and is now on his maintenence doses. The mother and patient deny any signs of worsening wound or signs of infection. The patient has been attempting to keep the area clean and dry at all times. The patient has left the wound open to air during day and covered with bandages at night. The patient is in PT/OT and is now able to walk independently. The patient denied any active issues today.     Past Medical History:   Diagnosis Date   ??? Anemia    ??? Crohn's colitis (CMS-HCC)    ??? Spondylolysis     Ankylosing spondylitis       Patient Active Problem List   Diagnosis   ??? Dens fracture (CMS-HCC)   ??? Crohn's disease of small and large intestines with complication (CMS-HCC)   ??? Arthritis associated with inflammatory bowel disease   ??? Anal condyloma   ??? Wound dehiscence, surgical   ??? Fall   ??? Postoperative wound infection   ??? Pressure injury of skin   ??? Protein-calorie malnutrition, severe (CMS-HCC)   ??? Spondylolysis   ??? Crohn's colitis (CMS-HCC)   ??? Closed burst fracture of lumbar vertebra with routine healing   ??? 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 Routine health maintenance   ??? Seizure disorder (CMS-HCC)       Past Surgical History:   Procedure Laterality Date   ??? HERNIA REPAIR     ??? NECK SURGERY  2016    spinal fusion.has screw in his spine   ??? PR COLONOSCOPY W/BIOPSY SINGLE/MULTIPLE  09/15/2014    Procedure: COLONOSCOPY, FLEXIBLE, PROXIMAL TO SPLENIC FLEXURE; WITH BIOPSY, SINGLE OR MULTIPLE;  Surgeon: Zetta Bills, MD;  [facility];  Service: Gastroenterology   ??? PR COLONOSCOPY W/BIOPSY SINGLE/MULTIPLE N/A 10/06/2016    Procedure: COLONOSCOPY, FLEXIBLE, PROXIMAL TO SPLENIC FLEXURE; WITH BIOPSY, SINGLE OR MULTIPLE;  Surgeon: Zetta Bills, MD;  Location: [facility];  Service: Gastroenterology   ??? PR COLONOSCOPY W/BIOPSY SINGLE/MULTIPLE N/A 01/04/2021    Procedure: COLONOSCOPY, FLEXIBLE, PROXIMAL TO SPLENIC FLEXURE; WITH BIOPSY, SINGLE OR MULTIPLE;  Surgeon: Zetta Bills, MD;  Location: [facility]URES [facility];  Service: Gastroenterology   ??? PR COMPLEX DRAINAGE, WOUND Midline 05/20/2021    Procedure: INCISION & DRAINAGE, COMPLEX, POSTOPERATIVE WOUND INFECTION BACK/PRONE;  Surgeon: Seth Bake, MD;  Location: MAIN OR [facility];  Service: Neurosurgery   ??? PR DEBRIDEMENT, SKIN, SUB-Q TISSUE,=<20 SQ CM Midline 07/19/2021    Procedure: DEBRIDEMENT; SKIN & SUBCUTANEOUS TISSUE TRUNK;  Surgeon: Clarene Duke, MD;  Location: MAIN OR [facility];  Service: Plastics   ??? PR DEBRIDEMENT, SKIN, SUB-Q TISSUE,MUSCLE,=<20 SQ CM Midline 06/18/2021    Procedure: DEBRIDEMENT; SKIN, SUBCUTANEOUS TISSUE, & MUSCLE;  Surgeon: Clarene Duke, MD;  Location: MAIN OR [facility];  Service: Plastics   ??? PR IONM 1 ON 1 IN OR W/ATTENDANCE EACH 15 MINUTES N/A 04/25/2014    Procedure: CONTINUOUS INTRAOPERATIVE NEUROPHYSIOLOGY MONITORING IN OR;  Surgeon: Everlean Alstrom, MD;  Location: MAIN OR [facility];  Service: Neurosurgery   ??? PR MUSCLE-SKIN FLAP,TRUNK Midline 05/24/2021    Procedure: MUSCLE, MYOCUTANEOUS, OR FASCIOCUTANEOUS FLAP; TRUNK;  Surgeon: Clarene Duke, MD;  Location: MAIN OR [facility];  Service: Plastics   ??? PR ODONTOID,FX,GRAFTING,OPEN Midline 04/25/2014    Procedure: OPEN TREATMENT &/OR REDCUCTION ODONTOID FX/DISLOCATION, ANTERIOR APPROACH, INTERNAL FIXATION, WITH GRAFT;  Surgeon: Everlean Alstrom, MD;  Location: MAIN OR [facility];  Service: Neurosurgery   ??? PR SURG DIAGNOSTIC EXAM, ANORECTAL N/A 01/21/2021    Procedure: ANORECTAL EXAM, SURGICAL, REQUIRING ANESTHESIA (GENERAL, SPINAL, OR EPIDURAL), DIAGNOSTIC;  Surgeon: Sharyn Lull, MD;  Location: MAIN OR [place];  Service: Gastrointestinal   ??? PR SURG EXCISION OF ANAL LESION(S) N/A 01/21/2021    Procedure: DESTRUCTION OF LESION(S), ANUS (EG, CONDYLOMA, PAPILLOMA, MOLLUSCUM CONTAGIOSUM) SIMPLE; SURGICAL EXCISION;  Surgeon: Sharyn Lull, MD;  Location: MAIN OR [place];  Service: Gastrointestinal   ??? TONSILLECTOMY           Current Outpatient Medications:   ???  ascorbic acid, vitamin C, (ASCORBIC ACID) 500 MG tablet, Take 1 tablet (500 mg total) by mouth daily., Disp: , Rfl:   ???  azaTHIOprine (IMURAN) 50 mg tablet, TAKE 3 TABLETS BY MOUTH EVERY DAY, Disp: 270 tablet, Rfl: 0  ???  busPIRone (BUSPAR) 15 MG tablet, TAKE 1 TABLET (15 MG TOTAL) BY MOUTH THREE (3) TIMES A DAY., Disp: 270 tablet, Rfl: 3  ???  ferrous sulfate 325 (65 FE) MG tablet, Take 1 tablet (325 mg total) by mouth in the morning., Disp: , Rfl:   ???  HUMIRA PEN CITRATE FREE 40 MG/0.4 ML, Inject the contents of 1 pen (40 mg total) under the skin every seven (7) days., Disp: 4 each, Rfl: 5  ???  HUMIRA PEN CITRATE FREE STARTER PACK FOR CROHN'S/UC/HS 3 X 80 MG/0.8 ML, Inject the contents of 2 pens (160 mg total) day 1, then inject the contents of 1 pen (80 mg total) on day 15 for loading dose. (Patient not taking: Reported on 01/03/2022), Disp: 3 each, Rfl: 0  ???  levETIRAcetam (KEPPRA) 1000 MG tablet, Take 1 tablet (1,000 mg total) by mouth every twelve (12) hours., Disp: 180 tablet, Rfl: 3  ???  methocarbamoL (ROBAXIN) 500 MG tablet, TAKE 1 TABLET (500 MG TOTAL) BY MOUTH TWO (2) TIMES A DAY AS NEEDED FOR PAIN., Disp: 90 tablet, Rfl: 1  ???  midazolam 5 mg/spray (0.1 mL) Spry, Give 1 spray (5mg ) into 1 nostril. If no response in 10 min, may give second spray in other nostril. Do not give second dose if patient has trouble breathing or excessive sedation. Max 2 doses/seizure episode, 1 episode every 3 days or 5 episodes/month., Disp: 2 each, Rfl: 4  ???  oxybutynin (DITROPAN) 5 MG tablet, Take 1 tablet (5 mg total) by mouth Three (3) times a day. (Patient taking differently: Take 1 tablet (5 mg total) by mouth in the morning.), Disp: 270 tablet, Rfl: 3  ???  tamsulosin (FLOMAX) 0.4 mg capsule, Take 1 capsule (0.4 mg total) by mouth daily. (Patient  taking differently: Take 1 capsule (0.4 mg total) by mouth daily. Evening), Disp: 90 capsule, Rfl: 3    Allergies   Allergen Reactions   ??? Infliximab      Seizure activity  Other reaction(s): Other (See Comments)  Seizures       Family History   Problem Relation Age of Onset   ??? Autoimmune disease Maternal Grandmother         Psoriasis   ??? Polycystic kidney disease Sister    ??? Anesthesia problems Neg Hx    ??? Ulcerative colitis Neg Hx    ??? Crohn's disease Neg Hx    ??? Colorectal Cancer Neg Hx    ??? Pancreatic cancer Neg Hx    ??? Esophageal cancer Neg Hx        Social History  Accompanied by mother.    ROS  See HPI. All other systems reviewed are negative.    Physical Exam  BP 103/56 (BP Site: L Arm, BP Position: Sitting, BP Cuff Size: Medium)  - Pulse 66  - Temp 37.1 ??C (98.7 ??F) (Temporal)  - Resp 16  - SpO2 96%      General Appearance: No acute distress.   Pulmonary: Normal respiratory effort.   Skin:  Midline thoracic superficial wound with healthy wound bed. Stable appearing area of overall wound diameter to 5x2cm. No concern for full thickness wound or infection today. Hypergranulation tissue treated with Silver Nitrate.  Dry surrounding skin which was cleaned. Covered with island dressing            Assessment and Plan  RAYMONT ANDREONI is a 41 y.o. male who suffered a 10 ft fall 04/11/21 resulting in multiple spinal fractures & acute spinal cord injury s/p PLIF C2-4, L1-2 decompression laminectomy with reduction of fx T12-L3 PLA with pedicle screw fixation and local autographing on 04/12/21 at [facility], who then had a dehiscence of his incision. He underwent previous debridement and VAC placement by Neurosurgery on 05/20/21 followed by delayed closure by Plastics. His postoperative course was complicated by dehiscence, and he underwent debridement and VAC placement on 07/19/21.  - Overall the midline posterior wound appears to be stable in regard to depth, size and shape when compared to prior evaluation in clinic.  The wound is now only 2x5cm and healthy.   - Continue to leave open to air as much as possible when home  - Cover with [place] dressing when outside of home  - Avoid any pressure on wound  - Avoid hip flexion, upper extremity horizontal adduction and scapular protraction to minimize tension on wound  -Photographs were taken and placed in the patient's chart for comparison  - I recommended that patient follow up with their spinal surgery team as they have not followed up since October per patient report. The patient will need to be reassessed for PT/OT needs but in my opinion the patient should continue with therapy as he is making significant strides with the therapy he is receiving  - Follow up in 4 weeks; if there are concerns we can push appointment back to 8 weeks if there continues to be interval healing.   - The patient understands that the immuno suppression of Humera can lead to delayed wound healing and therefore closely monitoring this area is critical while on the medication.  -The patient was in agreement with the plan and all questions were addressed to their satisfaction today.      Reinaldo Helt Isaac Bliss, MPAS  [facility] Plastic  and Reconstructive Surgery

## 2022-01-19 NOTE — Unmapped (Signed)
OUTPATIENT PHYSICAL THERAPY  DAILY PROGRESS NOTES    Patient Name: Kerry Mooney  Date of Birth:[birth year]  Date: 01/19/2022  Session Number: 18 (8/10 to reassessment)  Therapy Diagnosis:   Encounter Diagnoses   Name Primary?    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    SCI Level: C2 ASIA D SCI    COVID Mask use: Patient wore a mask for the entire therapy session. and Patient and/or caregiver opted not to wear mask for duration or majority of session per updated clinic mask policy.      Assessment:   Today's session focused on building ambulation distance when walking without an AD.  Patient verbalizes that he does not need an AD anymore, however, only able to LE strengthening and bilateral hip and  knee strengthening with and without UE support.  Backward walking with emphasis on symmetrical stepping.   Patient will continue to benefit from PT to maximize functional Independence.      I don't  feel unsteady.  I feel balanced, but by right leg get fatigued more quickly       NEXT session:  Discharge  WISCII,  10 MWT ,  5 STS, TUG       Communication/consultation with other professionals: n/a today     Prior Objective Testing  Tests Eval (10/28/21) 11/26/21 11/29/21 Reassessment  12/13/21    TUG 15.9 seconds, RW   23.85 sec with no UE support  21.6 sec ( no AD)    5x STS 28.6 seconds  20.91 sec  15.04 sec    WISCI II Level 6 (walker, brace on R, Min A - SBA x 1) Level 12 - (two crutches, brace on right, no assist) Level 15 - Brace - Once Crutch , No Assistance > 10 M   10 MWT  TBD  0.62 m/s with walker 0.47 m/s 10 m/ 15.86 sec     0.63 m/s without the walker      Problem List: decreased UE and LE strength, impaired balance, decreased ROM, impaired posture, deconditioning, impaired functional mobility, impaired ADLs and edema    Plan     Next session:   Continue LE strengthening -  quadruped, tall kneeling for hip stability, long distance ambulation/endurance with trekking poles    Home Exercise ID: [id number]    Goals:  Patient/Family Goals:I want to be strong enough to walk without the walker and make walking my primary means of getting around.     Goals Met:    Patient will be able to properly demonstrate current HEP independently x1 in clinic to build upon functional gains in PT.  Met 11/29/21  Patient will ambulate 500 feet with RW, AFO donned on R LE, and decreased gait deviation to allow return to household/community ambulation. Met 6/68/23  Patient will ascend and descend 3 stairs with CGA assistance, with  1 or no handrails and safe technique demonstrating step  through pattern in order to increase independence with household/community mobility.Met 11/29/21  Patient will be able to perform floor transfer with bilateral UE support and supervision assist.  Met 11/29/21  Patient will be able to perform floor transfer with no external support and SBA.   -  Met  11/26/21  Patient will complete .  Met 11/29/21      Long Term Goals:  Patient will improve Timed Up and Go score to < 12 seconds to further reduce fall risk. Not Met 12/13/21  Patient will ambulate >1.0 meters/second to decrease fall risk and improve household/community ambulation Not Met 12/13/21  Patient will use ambulation with LRAD as primary means of mobility more than 60% of the time.  Met 01/12/22  - Stopped using Power WC at home .   Patient will be able to ambulate 1,088ft with minimal gait deviations and AFO donned on R LE to return to community ambulation and demonstrate improved ambulation endurance. Met  01/19/22  Patient will ascend and descend 6 stairs with SBA assistance, with one  handrails and safe technique demonstrating step through pattern in order to increase independence with household/community mobility.  Met 01/19/22 - needs rail    Patient will improve TUG score to < 10 seconds to further reduce falls risk. Today???s assessment is for specific pre or post testing only per MD request. Full PT evaluation and Skilled Physical Therapy treatment intervention have not been requested.      Subjective:   Pt states: I feel like I am doing pretty well.        Reason for Referral/History of Present Condition/Onset of injury/exacerbation:   41 y.o. male presents with PMH of C2 ASIA D SCI from 34ft fall through attic (04/11/2021), Patient has a PMH of seizures, ankylosing spondylitis, TBI and crohns disease. Per MD note, patient was admitted to [facility] from 10/18-10/24/2022, readmitted to [facility] on 05/03/21 for assessment of cervical wound due to drainage and concern for infection and readmitted to [facility] from 06/01/21 to 06/16/21 before requiring transfer back to acute for wound debridement/washout. Patient was readmitted again to [facility] from 06/25/21-07/18/21 before being transferred back to [facility] main for wound washout      Objective:      Pain:  no complaints  Falls: None    TA: 20  Up and down 12 steps with one rail and Mod Ind      Floor transfers with no external support and S.      Picks up 10 objects from floor with S     TE: 25 min   Ambulation with  AFO, no AD x 174 feet prior to legs shaky and fatigue.   RPE 7 -   Postural limitations due to spondilolysis  Ambulates 1024 feet with AFO Rollater walker and Mod Ind. RPE 4 - no pain or fatigue    Review of upgraded HEP for Discharge     Discussed need to continue to use AFO for ambulation at home        Home Exercise Program:    - sit<>stand with UE extended, feet staggered for L disavantaged, eccentric lowering emphasized   - at half wall for postural support:    - hip abd with glut med focus (2 UE support)    - hip flex (1 UE support)    - hip flex into extension (straight knee, 1 UE support)  - clam shell  - hip abd (straight leg)  - modified bird  dog  - Postural Exercises:   Shoulder ER with hands behind head  Horizontal Abd/Add  Shoulder Flexion: I and Y  TA - Alternating Heel slides,  TA - Alternating Hip Abd/ Add,    Supine bicycle legs    [employer] [id number]      Total Treatment Time:  45 min   TE: 20 min  TA:  25 min     I attest that I have reviewed the above information.  Signed: Vira Blanco, PT, DPT  01/19/2022 10:25 AM

## 2022-01-20 ENCOUNTER — Ambulatory Visit
Admit: 2022-01-20 | Discharge: 2022-01-21 | Payer: PRIVATE HEALTH INSURANCE | Attending: Spinal Cord Injury Medicine | Primary: Spinal Cord Injury Medicine

## 2022-01-20 DIAGNOSIS — S14151A Other incomplete lesion at C1 level of cervical spinal cord, initial encounter: Principal | ICD-10-CM

## 2022-01-20 NOTE — Unmapped (Signed)
[facility] Physical Medicine and Rehabilitation   Clinic Note      Patient Name:Kerry Mooney  MRN: [id number]  DOB: [birth year]  Age: 41 y.o.   Encounter Date: 01/20/2022     ASSESSMENT:     Patient is a 41 y.o. male seen in the SCI clinic for traumatic C2 ASIA D SCI.        RECOMMENDATIONS:     1. Bowel: Consider restarting Colace if you feel you are straining too much to have a bowel movement.     2. Bladder: Continue Oxybutynin 5mg  daily and Flomax 0.4mg  nightly.    3. Spasticity: No issues currently    4. Skin: Will continue with pressure reliefs and wound care of his surgical site. Is being closely followed by [facility] in the outpatient clinic.     5. Neuropathic pain: No issues currently.     6. Equipment: No new equipment needs.     7. Therapy: PT referral placed.        Follow up: Return in about 3 months (around 04/22/2022).      SUBJECTIVE:     Chief Complaint: C2 ASIA D    History of Present Illness: Patient is a 41 y.o. year old male seen following his AIR stay in 06/2021 and 07/2021. He was admitted initially to [facility] following a fall 10 FT through a ceiling landing on exercise equipment. 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 PLIF C2-4, l1-2 decompression laminectomy with reduction of fx T12-L3 PLA with pedicle screw fixation and local autographing. PMH: seizures, ankylosing spondylitis, TBI and crohns disease. Patient was admitted to [facility] AIR from 10/18-10/24/2022, readmitted to [facility] on 05/03/21 for assessment of cervical wound due to drainage and concern for infection and readmitted to [facility] AIR from 06/01/21 to 06/16/21 before requiring transfer back to acute for wound debridement/ washout.Patient was readmitted again to [facility] AIR from 06/25/21-07/18/21 before being transferred back to [facility] main for wound washout.    At the time of his discharge, patient had improved bladder sensation and was performing timed toileting. He was discharged on oxybutynin 5 mg 3 times daily as well as Flomax 0.4 mg nightly.  He had also had improved sensation and control with his bowels and was no longer on a formalized bowel program.  He was contact-guard assist with his transfers using a rolling walker and mod I to set up with most ADLs.  He was still primarily using a power wheelchair for mobility.    SCI Injury/level: C2 ASIA D    Interval History:   Surgical wound is still healing. He is being followed closely by Plastic Surgery for his wound and last saw them last week. Recently restarted weekly Humira for his Crohn's disease. Plastic Surgery is aware.     Feels physical function has greatly improved. Can now independently take out the trash and feed the cats. Recently was rejected for disability benefits.     1. Bowel: Feels urge and has one BM once daily. Has not had any episodes of incontinence for about a month. Having solid stools and needs to strain. Patient is not taking anything for his BM's--GI discontinued colace and senna.      2. Bladder: Takes Flomax 0.4mg  nightly. Also takes oxybutynin 5mg  daily from TID. Has not had any episodes of urinary incontinence.     3. Spasticity: Patient continues to have no issues with spasticity at this time.  4. Skin: Patient has no skin breakdown anywhere other than his initial surgical wound.    5. Neuropathic pain: Patient denies any issues with neuropathic pain.    6. Equipment: Has power wheelchair at home but only uses it in the morning and evening. Has been using a walking rollator much more often at home now. Uses transport chair when he goes into the community. Patient has a tub bench that he uses regularly.     7. Therapy: Able to tie his shoes, write, and text on the phone--discharged by OT. Currently seeing PT once weekly.       Functional History:   Selfcare/feeding: Independent  Dressing: Independent  Transfers: Mod I with rolling walker  Ambulation/Mobility: Mod I with wheelchair or rolling walker  Bladder Management: Independent  Bowel Management: Independent      Past Medical History:   Diagnosis Date   ??? Anemia    ??? Crohn's colitis (CMS-HCC)    ??? Spondylolysis     Ankylosing spondylitis     Past Surgical History:   Procedure Laterality Date   ??? HERNIA REPAIR     ??? NECK SURGERY  2016    spinal fusion.has screw in his spine   ??? PR COLONOSCOPY W/BIOPSY SINGLE/MULTIPLE  09/15/2014    Procedure: COLONOSCOPY, FLEXIBLE, PROXIMAL TO SPLENIC FLEXURE; WITH BIOPSY, SINGLE OR MULTIPLE;  Surgeon: Zetta Bills, MD;  Location: GI PROCEDURES [place] [facility];  Service: Gastroenterology   ??? PR COLONOSCOPY W/BIOPSY SINGLE/MULTIPLE N/A 10/06/2016    Procedure: COLONOSCOPY, FLEXIBLE, PROXIMAL TO SPLENIC FLEXURE; WITH BIOPSY, SINGLE OR MULTIPLE;  Surgeon: Zetta Bills, MD;  Location: [facility] PROCEDURES [place] [facility];  Service: Gastroenterology   ??? PR COLONOSCOPY W/BIOPSY SINGLE/MULTIPLE N/A 01/04/2021    Procedure: COLONOSCOPY, FLEXIBLE, PROXIMAL TO SPLENIC FLEXURE; WITH BIOPSY, SINGLE OR MULTIPLE;  Surgeon: Zetta Bills, MD;  Location: GI PROCEDURES [place] [facility];  Service: Gastroenterology   ??? PR COMPLEX DRAINAGE, WOUND Midline 05/20/2021    Procedure: INCISION & DRAINAGE, COMPLEX, POSTOPERATIVE WOUND INFECTION BACK/PRONE;  Surgeon: Seth Bake, MD;  Location: MAIN OR [facility];  Service: Neurosurgery   ??? PR DEBRIDEMENT, SKIN, SUB-Q TISSUE,=<20 SQ CM Midline 07/19/2021    Procedure: DEBRIDEMENT; SKIN & SUBCUTANEOUS TISSUE TRUNK;  Surgeon: Clarene Duke, MD;  Location: MAIN OR [facility];  Service: Plastics   ??? PR DEBRIDEMENT, SKIN, SUB-Q TISSUE,MUSCLE,=<20 SQ CM Midline 06/18/2021    Procedure: DEBRIDEMENT; SKIN, SUBCUTANEOUS TISSUE, & MUSCLE;  Surgeon: Clarene Duke, MD;  Location: MAIN OR [facility];  Service: Plastics   ??? PR IONM 1 ON 1 IN OR W/ATTENDANCE EACH 15 MINUTES N/A 04/25/2014    Procedure: CONTINUOUS INTRAOPERATIVE NEUROPHYSIOLOGY MONITORING IN OR;  Surgeon: Everlean Alstrom, MD;  Location: MAIN OR [facility];  Service: Neurosurgery   ??? PR MUSCLE-SKIN FLAP,TRUNK Midline 05/24/2021    Procedure: MUSCLE, MYOCUTANEOUS, OR FASCIOCUTANEOUS FLAP; TRUNK;  Surgeon: Clarene Duke, MD;  Location: MAIN OR [facility];  Service: Plastics   ??? PR ODONTOID,FX,GRAFTING,OPEN Midline 04/25/2014    Procedure: OPEN TREATMENT &/OR REDCUCTION ODONTOID FX/DISLOCATION, ANTERIOR APPROACH, INTERNAL FIXATION, WITH GRAFT;  Surgeon: Everlean Alstrom, MD;  Location: MAIN OR [facility];  Service: Neurosurgery   ??? PR SURG DIAGNOSTIC EXAM, ANORECTAL N/A 01/21/2021    Procedure: ANORECTAL EXAM, SURGICAL, REQUIRING ANESTHESIA (GENERAL, SPINAL, OR EPIDURAL), DIAGNOSTIC;  Surgeon: Sharyn Lull, MD;  Location: MAIN OR [place];  Service: Gastrointestinal   ??? PR SURG EXCISION OF ANAL LESION(S) N/A 01/21/2021    Procedure: DESTRUCTION OF LESION(S), ANUS (EG, CONDYLOMA, PAPILLOMA, MOLLUSCUM CONTAGIOSUM) SIMPLE; SURGICAL  EXCISION;  Surgeon: Sharyn Lull, MD;  Location: MAIN OR [facility];  Service: Gastrointestinal   ??? TONSILLECTOMY         Social History:  reports that he has never smoked. He has never used smokeless tobacco. He reports that he does not currently use alcohol. He reports that he does not use drugs.     Family History: Patient denies significant family history.                  MEDICATIONS: Reviewed in EPIC. Pertinent as noted in history.    Allergies: Infliximab    Review of Systems: 10 organ systems reviewed and pertinent as noted in HPI.         OBJECTIVE:     Physical Exam:   Vitals: Wt 65 kg (143 lb 4.8 oz)  - BMI 20.56 kg/m??   General: Pleasant gentleman in a transport wheelchair no acute distress  Psych: Pleasant appropriate  Eyes: Anicteric, sclera clear  ENT: Moist mucous membranes  CV: Good perfusion in all extremities  Resp: Regular work of breathing on room air  GI: Soft, nondistended  Cranial Nerves: II-XII grossly intact  Muscle Tone: No increased muscle tone appreciated  ROM: Full range of motion in upper extremities and lower extremities  Gait: Wide based gait. Upper body significantly slouched.     Manual Muscle Testing:    Upper Extremities  Muscle Group    Right    Left  Shoulder Elevators C4  5   5  Elbow Flexors C5   5   5  Wrist Extensor C6  5   5  Elbow Extensor C7   5   5  Finger Flexor C8  5   5  Intrinsic T1   5   5    Lower Extremities  Muscle Group    Right    Left  Hip Flexors L2   4   5  Knee Extensor L3   5   5  Ankle Dorsiflexors L4  4- (to neutral)  5   Plantar flexors S1  5   5      Caro Hight, MD

## 2022-01-20 NOTE — Unmapped (Addendum)
You were seen today for your traumatic spinal cord injury. We recommend the following:    Bowel: continue to monitor your bowel movements. Consider restarting Colace if you feel you are straining too much to have a bowel movement.     Bladder: Continue Ditropan 5mg  daily and Flomax 0.4mg  nightly.      Skin: Continue pressure reliefs and wound care of your surgical site per Plastic Surgery's recommendations. Continue to follow closely with Plastic Surgery regarding your wound healing.     Therapy: A new PT referral has been sent.

## 2022-01-26 ENCOUNTER — Ambulatory Visit: Admit: 2022-01-26 | Payer: PRIVATE HEALTH INSURANCE

## 2022-01-26 ENCOUNTER — Ambulatory Visit: Admit: 2022-01-26 | Discharge: 2022-02-24 | Payer: PRIVATE HEALTH INSURANCE

## 2022-01-26 NOTE — Unmapped (Signed)
This care was provided during physical therapy residency mentored training.  I was present throughout and participated in the examination, assessment, treatment and plan of care.  Vira Blanco, PT  [id number], 2023 5:09 AM       OUTPATIENT PHYSICAL THERAPY  RE-ASSESSMENT NOTE    Patient Name: Kerry Mooney  Date of Birth:June 29, 1981  Date: 01/26/2022  Session Number: 19 (9/10 to reassessment)  Therapy Diagnosis:   Encounter Diagnoses   Name Primary?    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    SCI Level: C2 ASIA D SCI    COVID Mask use: Patient wore a mask for the entire therapy session. and Patient and/or caregiver opted not to wear mask for duration or majority of session per updated clinic mask policy.      Assessment:   Today's session focused on re-assessing pt progress towards goals and determining need for further physical therapy services. 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  Patient will continue to benefit from PT to maximize functional Independence.            NEXT session:  Trial FES cycle      Communication/consultation with other professionals: n/a today     Prior Objective Testing  Tests Eval (10/28/21) 11/26/21 11/29/21 Reassessment  12/13/21  Re-assessment 01/24/22   TUG 15.9 seconds, RW   23.85 sec with no UE support  21.6 sec ( no AD)  17.04 seconds no AD   5x STS 28.6 seconds  20.91 sec  15.04 sec  15.3 seconds (no AD)   WISCI II Level 6 (walker, brace on R, Min A - SBA x 1) Level 12 - (two crutches, brace on right, no assist)  Level 15 - Brace - Once Crutch , No Assistance > 10 M Level 15 - brace, one crutch, no assistance   10 MWT  TBD  0.62 m/s with walker 0.47 m/s 10 m/ 15.86 sec     0.63 m/s without the walker Average with rollator: 0.93 m/s  Average without rollator: 0.83 m/s      Problem List: decreased UE and LE strength, impaired balance, decreased ROM, impaired posture, deconditioning, impaired functional mobility, impaired ADLs and edema    Plan     Next session:  Trial FES    Home Exercise ID: [id number]    Frequency/Duration of visits: per re-assessment 01/26/22: 2x/week for 8 weeks    Goals:  Patient/Family Goals:I want to be strong enough to walk without the walker and make walking my primary means of getting around.     Goals Met:    Patient will be able to properly demonstrate current HEP  independently x1 in clinic to build upon functional gains in PT.  Met 11/29/21  Patient will ambulate 500 feet with RW, AFO donned on R LE, and decreased gait deviation to allow return to household/community ambulation. Met 6/68/23  Patient will ascend and descend 3 stairs with CGA assistance, with  1 or no handrails and safe technique demonstrating step through pattern in order to increase independence with household/community mobility.Met 11/29/21  Patient will be able to perform floor transfer with bilateral UE support and supervision assist.  Met 11/29/21  Patient will be able to perform floor transfer with no external support and SBA.   -  Met  11/26/21  Patient will complete .  Met 11/29/21      Shanon Seawright Term Goals:  Patient will improve Timed Up and Go score to < 12 seconds to further reduce fall risk. Not Met 01/26/22  Patient will ambulate >1.0 meters/second to decrease fall risk and improve household/community ambulation Not Met 01/26/22  Patient will use ambulation with LRAD as primary means of mobility more than 60% of the time.  Met 01/12/22  - Stopped using Power WC at home .   Patient will be able to ambulate 1,021ft with minimal gait deviations and AFO donned on R LE to return to community ambulation and demonstrate improved ambulation endurance. Met  01/19/22  Patient will ascend and descend 6 stairs with SBA assistance, with one  handrails and safe technique demonstrating step through pattern in order to increase independence with household/community mobility.  Met 01/19/22 - needs rail  New goal 01/26/22: Patient will ambulate at least 401 meters during with LRAD and AFO donned on RLE to demonstrate increased sustained gait speed to match that of the mean for community ambulators with SCI.  New goal 01/26/22: Patient will stop using transport chair for medical appointments and ambulate into clinic with LRAD for 2 consecutive weeks to demonstrate increased participation as a community ambulator.  New goal 01/26/22:  Patient will ambulate for all mobility with LRAD and minimal gait deviations with AFO donned for 2 consecutive weeks to demonstrate return to full participation in community as community ambulator.    Patient will improve TUG score to < 10 seconds to further reduce falls risk. Today???s assessment is for specific pre or post testing only per MD request. Full PT evaluation and Skilled Physical Therapy treatment intervention have not been requested.      Subjective:   Pt states: I feel like I am doing pretty well. I think I would want to keep coming into therapy.       Reason for Referral/History of Present Condition/Onset of injury/exacerbation:   41 y.o. male presents with PMH of C2 ASIA D SCI from 68ft fall through attic (04/11/2021), Patient has a PMH of seizures, ankylosing spondylitis, TBI and crohns disease. Per MD note, patient was admitted to [facility] from 10/18-10/24/2022, readmitted to [facility] on 05/03/21 for assessment of cervical wound due to drainage and concern for infection and readmitted to [facility] AIR from 06/01/21 to 06/16/21 before requiring transfer back to acute for wound debridement/washout. Patient was readmitted again to [facility] AIR from 06/25/21-07/18/21 before being transferred back to [facility] main for wound washout      Objective:      Pain:  no complaints  Falls: None    Physical Performance Tests and Measures: 35 minutes  5xSTS: 15.3 seconds   Self- selected gait speed with rollator: 0.76 m/s  with rollator: 1.1 m/s - (rollator  avg. speed 0.93)  Self-selected no device: 0.71 m/s  no device: 0.93 m/s - (no device avg speed 0.83 m/s)     TUG:   -16.1 seconds with rollator   -24.4 seconds with B trekking poles  -17.1 seconds with L lofstrand   -17.04 seconds no device    : 326 feet (99.3 meters x3 297.9 meters estimated for 6 minutes) - RPE 4/10     Self-Care: 10 minutes  Discussion of goals going forward, plan to trial FES for increased muscle activation, increasing intensity of gait training   Recommended that pt use rollator as assistive device at this time due to increased stability and gait speed compared to other devices            Home Exercise Program:    - sit<>stand with UE extended, feet staggered for L disavantaged, eccentric lowering emphasized   - at half wall for postural support:    - hip abd with glut med focus (2 UE support)    - hip flex (1 UE support)    - hip flex into extension (straight knee, 1 UE support)  - clam shell  - hip abd (straight leg)  - modified bird dog  - Postural Exercises:   Shoulder ER with hands behind head  Horizontal Abd/Add  Shoulder Flexion: I and Y  TA - Alternating Heel slides,  TA - Alternating Hip Abd/ Add,    Supine bicycle legs    [employer] [id number]      Total Treatment Time:  45 min   See above    I attest that I have reviewed the above information.  Signed: Lynford Humphrey, PT, DPT  01/26/2022 10:15 AM

## 2022-01-31 ENCOUNTER — Ambulatory Visit: Admit: 2022-01-31 | Discharge: 2022-02-01 | Payer: PRIVATE HEALTH INSURANCE

## 2022-01-31 NOTE — Unmapped (Signed)
I spoke with patient reagarding the next shipment of Humira and he declined the refill at this time. The patient states that he has at lease 2 injections on hand and requests a call back in 2 weeks. I will reschedule refill call.

## 2022-02-01 NOTE — Unmapped (Signed)
OUTPATIENT PHYSICAL THERAPY  DAILY  NOTE    Patient Name: Kerry Mooney  Date of Birth:1981/02/23  Date: 02/01/2022  Session Number: 20 (1/10 to reassessment)  Therapy Diagnosis:               Referring Pracitioner: Sena Hitch Al*   Occurrence Codes:  Onset of Injury: 18-Apr-2021  Date of Evaluation: 10/28/2021  Date Treatment Began: 11/01/21  Certification Dates: [facility] 10/28/21 - 01/27/22  [facility] Financial Assistance (09/16/2021 - 03/20/2023)      Primary Therapist: Rhea Belton, PT, DPT    Precautions:  Open surgical wound on back, actively seeing [facility] plastic surgery for wound care.    SCI Level: C2 ASIA D SCI    COVID Mask use: Patient wore a mask for the entire therapy session. and Patient and/or caregiver opted not to wear mask for duration or majority of session per updated clinic mask policy.      Assessment:   Today's session focused on  Set up and trial of FES  RT300  Leg Therapy  Bike - Right side.  Patient tolerated 20 min of the leg bike and was able to push for 18 min and 57 sec without the motor.  Patient could feel the stimulation on his right LE and said that he felt like his legs were more awake after the stim .  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  Patient will continue to benefit from PT to maximize functional Independence.      NEXT session:  Continue   Trial FES cycle  Right LE only  ( Ant Tib, Quad and Ham)            Prior Objective Testing  Tests Eval (10/28/21) 11/26/21 11/29/21 Reassessment  12/13/21  Re-assessment 01/24/22   TUG 15.9 seconds, RW   23.85 sec with no UE support  21.6 sec ( no AD)  17.04 seconds no AD   5x STS 28.6 seconds  20.91 sec  15.04 sec  15.3 seconds (no AD)   WISCI II Level 6 (walker, brace on R, Min A - SBA x 1) Level 12 - (two crutches, brace on right, no assist)  Level 15 - Brace - Once Crutch , No Assistance > 10 M Level 15 - brace, one crutch, no assistance   10 MWT  TBD  0.62 m/s with walker 0.47 m/s 10 m/ 15.86 sec     0.63 m/s without the walker Average with rollator: 0.93 m/s  Average without rollator: 0.83 m/s      Problem List: decreased UE and LE strength, impaired balance, decreased ROM, impaired posture, deconditioning, impaired functional mobility, impaired ADLs and edema    Plan     Next session:  Trial FES    Home Exercise ID: [id number]    Frequency/Duration of visits: per re-assessment 01/26/22: 2x/week for 8 weeks    Goals:  Patient/Family Goals:I want to be strong enough to walk without the walker and make walking my primary means of getting around.     Goals Met:    Patient will be able to properly demonstrate current HEP independently x1 in clinic to build upon functional gains in PT.  Met 11/29/21  Patient will ambulate 500 feet with RW, AFO donned on R LE, and decreased gait deviation to allow return to household/community ambulation. Met  6/68/23  Patient will ascend and descend 3 stairs with CGA assistance, with  1 or no handrails and safe technique demonstrating step through pattern in order to increase independence with household/community mobility.Met 11/29/21  Patient will be able to perform floor transfer with bilateral UE support and supervision assist.  Met 11/29/21  Patient will be able to perform floor transfer with no external support and SBA.   -  Met  11/26/21  Patient will complete .  Met 11/29/21      Long Term Goals:  Patient will improve Timed Up and Go score to < 12 seconds to further reduce fall risk. Not Met 01/26/22  Patient will ambulate >1.0 meters/second to decrease fall risk and improve household/community ambulation Not Met 01/26/22  Patient will use ambulation with LRAD as primary means of mobility more than 60% of the time.  Met 01/12/22  - Stopped using Power WC at home .   Patient will be able to ambulate 1,038ft with minimal gait deviations and AFO donned on R LE to return to community ambulation and demonstrate improved ambulation endurance. Met  01/19/22  Patient will ascend and descend 6 stairs with SBA assistance, with one  handrails and safe technique demonstrating step through pattern in order to increase independence with household/community mobility.  Met 01/19/22 - needs rail  New goal 01/26/22: Patient will ambulate at least 401 meters during with LRAD and AFO donned on RLE to demonstrate increased sustained gait speed to match that of the mean for community ambulators with SCI.  New goal 01/26/22: Patient will stop using transport chair for medical appointments and ambulate into clinic with LRAD for 2 consecutive weeks to demonstrate increased participation as a community ambulator.  New goal 01/26/22:  Patient will ambulate for all mobility with LRAD and minimal gait deviations with AFO donned for 2 consecutive weeks to demonstrate return to full participation in community as community ambulator.    Patient will improve TUG score to < 10 seconds to further reduce falls risk. Today???s assessment is for specific pre or post testing only per MD request. Full PT evaluation and Skilled Physical Therapy treatment intervention have not been requested.      Subjective:   Pt states: I feel like I am doing pretty well. I think I would want to keep coming into therapy.       Reason for Referral/History of Present Condition/Onset of injury/exacerbation:   41 y.o. male presents with PMH of C2 ASIA D SCI from 18ft fall through attic (04/11/2021), Patient has a PMH of seizures, ankylosing spondylitis, TBI and crohns disease. Per MD note, patient was admitted to [facility] from 10/18-10/24/2022, readmitted to [facility] on 05/03/21 for assessment of cervical wound due to drainage and concern for infection and readmitted to [facility] from 06/01/21 to 06/16/21 before requiring transfer back to acute for wound debridement/washout. Patient was readmitted again to [facility] from 06/25/21-07/18/21 before being transferred back to [facility] main for wound washout      Objective:      Pain:  no complaints  Falls: None     Treatment Rendered:      NMR:45 min   Set pt up with FES system for cardiovascular load, LE muscular strengthening and endurance, decreased tone/spasticity and increased circulation. Patient completed 20  mins of LE FES cycling on the RT300 for 3.74  miles with an average 11.4 Watts.      Warm Up time: 2 min  Average Resistance: 1.85 Nm  Average Asymmetry: 7%  Left   Set Speed: 40  rpm  Motor Supported Time: 00:08 min  Time without Motor: 18:57 min      Control Speed 40   Resistance 1.85 Nm    Muscles stimulated:   LEAD 1: Ant Tip Right @ 54 mA,     LEAD 3: Quad Right  @ 34 mA,   LEAD 4: Hamstring Right  @ 38 mA,     Good muscle contractions were observed in all muscle groups.  No adverse skin issues were noted under electrode placement.  For the full report, please refer to [email or web address]     Pt ID: [id number]  Pin: [phone]       Home Exercise Program:    - sit<>stand with UE extended, feet staggered for L disavantaged, eccentric lowering emphasized   - at half wall for postural support:    - hip abd with glut med focus (2 UE support)    - hip flex (1 UE support)    - hip flex into extension (straight knee, 1 UE support)  - clam shell  - hip abd (straight leg)  - modified bird dog  - Postural Exercises:   Shoulder ER with hands behind head  Horizontal Abd/Add  Shoulder Flexion: I and Y  TA - Alternating Heel slides,  TA - Alternating Hip Abd/ Add,    Supine bicycle legs    [employer] [id number]      Total Treatment Time:  45 min   NMR 45 min     I attest that I have reviewed the above information.  Signed: Vira Blanco, PT, DPT  02/01/2022 10:25 PM

## 2022-02-03 NOTE — Unmapped (Signed)
OUTPATIENT PHYSICAL THERAPY  DAILY  NOTE    Patient Name: Kerry Mooney  Date of Birth:06-28-1981  Date: 02/03/2022  Session Number: 21 (2/10 to reassessment)  Therapy Diagnosis:               Referring Pracitioner: Sena Hitch Al*   Occurrence Codes:  Onset of Injury: May 04, 2021  Date of Evaluation: 10/28/2021  Date Treatment Began: 11/01/21  Certification Dates: [facility] 10/28/21 - 01/27/22  [facility] Financial Assistance (09/16/2021 - 03/20/2023)      Primary Therapist: Rhea Belton, PT, DPT    Precautions:  Open surgical wound on back, actively seeing [facility] plastic surgery for wound care.    SCI Level: C2 ASIA D SCI    COVID Mask use: Patient wore a mask for the entire therapy session. and Patient and/or caregiver opted not to wear mask for duration or majority of session per updated clinic mask policy.      Assessment:   Today's session focused on continued trial of FES  RT300  Leg Therapy  Bike - Right side.  Patient tolerated 24 minutes of FES cycling at increased resistance with increased force output. The asymmetry between RLE and LLE was slightly greater this session, so pt may benefit from reducing resistance slightly next session and focus on reducing speed to emphasis increased output with RLE to equal that of LLE.  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  Patient will continue to benefit from PT to maximize functional Independence.      NEXT session:  Continue   Trial FES cycle  Right LE only  ( Ant Tib, Quad and Ham); follow with ambulation          Prior Objective Testing  Tests Eval (10/28/21) 11/26/21 11/29/21 Reassessment  12/13/21  Re-assessment 01/24/22   TUG 15.9 seconds, RW   23.85 sec with no UE support  21.6 sec ( no AD)  17.04 seconds no AD   5x STS 28.6 seconds  20.91 sec  15.04 sec  15.3 seconds (no AD)   WISCI II Level 6 (walker, brace on R, Min A - SBA x 1) Level 12 - (two crutches, brace on right, no assist)  Level 15 - Brace - Once Crutch , No Assistance > 10 M Level 15 - brace, one crutch, no assistance   10 MWT  TBD  0.62 m/s with walker 0.47 m/s 10 m/ 15.86 sec     0.63 m/s without the walker Average with rollator: 0.93 m/s  Average without rollator: 0.83 m/s      Problem List: decreased UE and LE strength, impaired balance, decreased ROM, impaired posture, deconditioning, impaired functional mobility, impaired ADLs and edema    Plan     Next session:  continue FES and follow with ambulation    Home Exercise ID: [id number]    Frequency/Duration of visits: per re-assessment 01/26/22: 2x/week for 8 weeks    Goals:  Patient/Family Goals:I want to be strong enough to walk without the walker and make walking my primary means of getting around.     Goals Met:    Patient will be able to properly demonstrate current HEP independently x1 in clinic to build upon functional gains in PT.  Met 11/29/21  Patient will ambulate 500 feet with RW, AFO donned on R LE, and decreased gait deviation to  allow return to household/community ambulation. Met 6/68/23  Patient will ascend and descend 3 stairs with CGA assistance, with  1 or no handrails and safe technique demonstrating step through pattern in order to increase independence with household/community mobility.Met 11/29/21  Patient will be able to perform floor transfer with bilateral UE support and supervision assist.  Met 11/29/21  Patient will be able to perform floor transfer with no external support and SBA.   -  Met  11/26/21  Patient will complete .  Met 11/29/21      Aahil Fredin Term Goals:  Patient will improve Timed Up and Go score to < 12 seconds to further reduce fall risk. Not Met 01/26/22  Patient will ambulate >1.0 meters/second to decrease fall risk and improve household/community ambulation Not Met 01/26/22  Patient will use ambulation with LRAD as primary means of mobility more than 60% of the time.  Met 01/12/22  - Stopped using Power WC at home .   Patient will be able to ambulate 1,097ft with minimal gait deviations and AFO donned on R LE to return to community ambulation and demonstrate improved ambulation endurance. Met  01/19/22  Patient will ascend and descend 6 stairs with SBA assistance, with one  handrails and safe technique demonstrating step through pattern in order to increase independence with household/community mobility.  Met 01/19/22 - needs rail  New goal 01/26/22: Patient will ambulate at least 401 meters during with LRAD and AFO donned on RLE to demonstrate increased sustained gait speed to match that of the mean for community ambulators with SCI.  New goal 01/26/22: Patient will stop using transport chair for medical appointments and ambulate into clinic with LRAD for 2 consecutive weeks to demonstrate increased participation as a community ambulator.  New goal 01/26/22:  Patient will ambulate for all mobility with LRAD and minimal gait deviations with AFO donned for 2 consecutive weeks to demonstrate return to full participation in community as community ambulator.    Patient will improve TUG score to < 10 seconds to further reduce falls risk. Today???s assessment is for specific pre or post testing only per MD request. Full PT evaluation and Skilled Physical Therapy treatment intervention have not been requested.      Subjective:   Pt states: felt pretty good after last session and did not really have muscle soreness. Legs have been feeling a little tired because of overall doing more and not using powerchair apart from getting in/out of shower     Reason for Referral/History of Present Condition/Onset of injury/exacerbation:   41 y.o. male presents with PMH of C2 ASIA D SCI from 54ft fall through attic (04/11/2021), Patient has a PMH of seizures, ankylosing spondylitis, TBI and crohns disease. Per MD note, patient was admitted to [facility] from 10/18-10/24/2022, readmitted to [facility] on 05/03/21 for assessment of cervical wound due to drainage and concern for infection and readmitted to [facility] from 06/01/21 to 06/16/21 before requiring transfer back to acute for wound debridement/washout. Patient was readmitted again to [facility] from 06/25/21-07/18/21 before being transferred back to [facility] main for wound washout      Objective:      Pain:  no complaints  Falls: None     Treatment Rendered:      NMR:45 min   Set pt up with FES system for cardiovascular load, LE muscular strengthening and endurance, decreased tone/spasticity and increased circulation. Patient completed 24  mins of LE FES cycling on the RT300 for 4.91 miles  with an average 23.7 Watts    Warm Up time: 2 min  Average Resistance: 4.5 Nm  Average Asymmetry: 11%  Left   Set Speed: 40  rpm  Motor Supported Time: 00:02 min  Time without Motor: 24:04 min      Control Speed 40   Resistance 1.85 Nm for first 2 minutes; progressing to 3.33 Nm due to pt pedaling at increased speed at 60 RPM; progressed to 5.09 Nm    Muscles stimulated:   LEAD 1: Ant Tip Right @ 54 mA,     LEAD 3: Quad Right  @ 34 mA,   LEAD 4: Hamstring Right  @ 38 mA,     Good muscle contractions were observed in all muscle groups.  No adverse skin issues were noted under electrode placement.  For the full report, please refer to [email or web address]     Pt ID: [id number]  Pin: [phone]       Home Exercise Program:    - sit<>stand with UE extended, feet staggered for L disavantaged, eccentric lowering emphasized   - at half wall for postural support:    - hip abd with glut med focus (2 UE support)    - hip flex (1 UE support)    - hip flex into extension (straight knee, 1 UE support)  - clam shell  - hip abd (straight leg)  - modified bird dog  - Postural Exercises:   Shoulder ER with hands behind head  Horizontal Abd/Add  Shoulder Flexion: I and Y  TA - Alternating Heel slides,  TA - Alternating Hip Abd/ Add,    Supine bicycle legs    [employer] [id number]      Total Treatment Time:  45 min   NMR 45 min     I attest that I have reviewed the above information.  Signed: Tacy Learn Adrieanna Boteler, PT, DPT  02/03/2022 9:30 AM

## 2022-02-10 ENCOUNTER — Ambulatory Visit: Admit: 2022-02-10 | Discharge: 2022-02-11 | Payer: PRIVATE HEALTH INSURANCE

## 2022-02-10 DIAGNOSIS — S21209S Unspecified open wound of unspecified back wall of thorax without penetration into thoracic cavity, sequela: Principal | ICD-10-CM

## 2022-02-10 NOTE — Unmapped (Signed)
[facility] Plastic Surgery  [place] PA-C    HPI  Kerry Mooney is a 41 y.o. male who suffered a 10 ft fall 04/11/21 resulting in multiple spinal fractures & acute spinal cord injury s/p PLIF C2-4, L1-2 decompression laminectomy with reduction of fx T12-L3 PLA with pedicle screw fixation and local autographing on 04/12/21 at [facility], who then had a dehiscence of his incision. He underwent previous debridement and VAC placement by Neurosurgery on 05/20/21 followed by delayed closure by Plastics. His postoperative course was complicated by dehiscence, and he underwent debridement and VAC placement on 07/19/21. He presents for a wound check.     Interval History:   Overall the patient is doing well. The patient been compliant with activity restrictions and minimizing extreme forward bending. He has also avoid long periods in supine position when sleeping. The mother denied any concerns for infection. The patient has returned to [facility] treatment of Crohns with no adverse issue of back wound. The patient is doing very well with PT/OT and is no longer using his wheel chair. He is now ambulating independently and with use of FWW. No other active issues or concerns reported.     Past Medical History:   Diagnosis Date   ??? Anemia    ??? Crohn's colitis (CMS-HCC)    ??? Spondylolysis     Ankylosing spondylitis       Patient Active Problem List   Diagnosis   ??? Dens fracture (CMS-HCC)   ??? Crohn's disease of small and large intestines with complication (CMS-HCC)   ??? Arthritis associated with inflammatory bowel disease   ??? Anal condyloma   ??? Wound dehiscence, surgical   ??? Fall   ??? Postoperative wound infection   ??? Pressure injury of skin   ??? Protein-calorie malnutrition, severe (CMS-HCC)   ??? Spondylolysis   ??? Crohn's colitis (CMS-HCC)   ??? Closed burst fracture of lumbar vertebra with routine healing   ??? 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 Routine health maintenance   ??? Seizure disorder (CMS-HCC)       Past Surgical History:   Procedure Laterality Date   ??? HERNIA REPAIR     ??? NECK SURGERY  2016    spinal fusion.has screw in his spine   ??? PR COLONOSCOPY W/BIOPSY SINGLE/MULTIPLE  09/15/2014    Procedure: COLONOSCOPY, FLEXIBLE, PROXIMAL TO SPLENIC FLEXURE; WITH BIOPSY, SINGLE OR MULTIPLE;  Surgeon: Zetta Bills, MD;  Location: GI PROCEDURES [facility];  Service: Gastroenterology   ??? PR COLONOSCOPY W/BIOPSY SINGLE/MULTIPLE N/A 10/06/2016    Procedure: COLONOSCOPY, FLEXIBLE, PROXIMAL TO SPLENIC FLEXURE; WITH BIOPSY, SINGLE OR MULTIPLE;  Surgeon: Zetta Bills, MD;  Location: [facility] PROCEDURES [facility];  Service: Gastroenterology   ??? PR COLONOSCOPY W/BIOPSY SINGLE/MULTIPLE N/A 01/04/2021    Procedure: COLONOSCOPY, FLEXIBLE, PROXIMAL TO SPLENIC FLEXURE; WITH BIOPSY, SINGLE OR MULTIPLE;  Surgeon: Zetta Bills, MD;  Location: [facility] PROC[facility]URES [facility];  Service: Gastroenterology   ??? PR COMPLEX DRAINAGE, WOUND Midline 05/20/2021    Procedure: INCISION & DRAINAGE, COMPLEX, POSTOPERATIVE WOUND INFECTION BACK/PRONE;  Surgeon: Seth Bake, MD;  Location: MAIN OR [facility];  Service: Neurosurgery   ??? PR DEBRIDEMENT, SKIN, SUB-Q TISSUE,=<20 SQ CM Midline 07/19/2021    Procedure: DEBRIDEMENT; SKIN & SUBCUTANEOUS TISSUE TRUNK;  Surgeon: Clarene Duke, MD;  Location: MAIN OR [facility];  Service: Plastics   ??? PR DEBRIDEMENT, SKIN, SUB-Q TISSUE,MUSCLE,=<20 SQ CM Midline 06/18/2021    Procedure: DEBRIDEMENT; SKIN, SUBCUTANEOUS TISSUE, &  MUSCLE;  Surgeon: Clarene Duke, MD;  Location: MAIN OR [facility];  Service: Plastics   ??? PR IONM 1 ON 1 IN OR W/ATTENDANCE EACH 15 MINUTES N/A 04/25/2014    Procedure: CONTINUOUS INTRAOPERATIVE NEUROPHYSIOLOGY MONITORING IN OR;  Surgeon: Everlean Alstrom, MD;  Location: MAIN OR [facility];  Service: Neurosurgery   ??? PR MUSCLE-SKIN FLAP,TRUNK Midline 05/24/2021    Procedure: MUSCLE, MYOCUTANEOUS, OR FASCIOCUTANEOUS FLAP; TRUNK;  Surgeon: Clarene Duke, MD;  Location: MAIN OR [facility];  Service: Plastics   ??? PR ODONTOID,FX,GRAFTING,OPEN Midline 04/25/2014    Procedure: OPEN TREATMENT &/OR REDCUCTION ODONTOID FX/DISLOCATION, ANTERIOR APPROACH, INTERNAL FIXATION, WITH GRAFT;  Surgeon: Everlean Alstrom, MD;  Location: MAIN OR [facility];  Service: Neurosurgery   ??? PR SURG DIAGNOSTIC EXAM, ANORECTAL N/A 01/21/2021    Procedure: ANORECTAL EXAM, SURGICAL, REQUIRING ANESTHESIA (GENERAL, SPINAL, OR EPIDURAL), DIAGNOSTIC;  Surgeon: Sharyn Lull, MD;  Location: MAIN OR [place];  Service: Gastrointestinal   ??? PR SURG EXCISION OF ANAL LESION(S) N/A 01/21/2021    Procedure: DESTRUCTION OF LESION(S), ANUS (EG, CONDYLOMA, PAPILLOMA, MOLLUSCUM CONTAGIOSUM) SIMPLE; SURGICAL EXCISION;  Surgeon: Sharyn Lull, MD;  Location: MAIN OR [place];  Service: Gastrointestinal   ??? TONSILLECTOMY           Current Outpatient Medications:   ???  HUMIRA PEN CITRATE FREE STARTER PACK FOR CROHN'S/UC/HS 3 X 80 MG/0.8 ML, Inject the contents of 2 pens (160 mg total) day 1, then inject the contents of 1 pen (80 mg total) on day 15 for loading dose., Disp: 3 each, Rfl: 0  ???  ascorbic acid, vitamin C, (VITAMIN C) 500 MG tablet, Take 1 tablet (500 mg total) by mouth daily., Disp: , Rfl:   ???  azaTHIOprine (IMURAN) 50 mg tablet, TAKE 3 TABLETS BY MOUTH EVERY DAY, Disp: 270 tablet, Rfl: 0  ???  busPIRone (BUSPAR) 15 MG tablet, TAKE 1 TABLET (15 MG TOTAL) BY MOUTH THREE (3) TIMES A DAY., Disp: 270 tablet, Rfl: 3  ???  ferrous sulfate 325 (65 FE) MG tablet, Take 1 tablet (325 mg total) by mouth in the morning., Disp: , Rfl:   ???  HUMIRA PEN CITRATE FREE 40 MG/0.4 ML, Inject the contents of 1 pen (40 mg total) under the skin every seven (7) days., Disp: 4 each, Rfl: 5  ???  levETIRAcetam (KEPPRA) 1000 MG tablet, Take 1 tablet (1,000 mg total) by mouth every twelve (12) hours., Disp: 180 tablet, Rfl: 3  ???  methocarbamoL (ROBAXIN) 500 MG tablet, TAKE 1 TABLET (500 MG TOTAL) BY MOUTH TWO (2) TIMES A DAY AS NEEDED FOR PAIN., Disp: 90 tablet, Rfl: 1  ???  midazolam 5 mg/spray (0.1 mL) Spry, Give 1 spray (5mg ) into 1 nostril. If no response in 10 min, may give second spray in other nostril. Do not give second dose if patient has trouble breathing or excessive sedation. Max 2 doses/seizure episode, 1 episode every 3 days or 5 episodes/month., Disp: 2 each, Rfl: 4  ???  oxybutynin (DITROPAN) 5 MG tablet, Take 1 tablet (5 mg total) by mouth Three (3) times a day. (Patient taking differently: Take 1 tablet (5 mg total) by mouth in the morning.), Disp: 270 tablet, Rfl: 3  ???  tamsulosin (FLOMAX) 0.4 mg capsule, Take 1 capsule (0.4 mg total) by mouth daily. (Patient taking differently: Take 1 capsule (0.4 mg total) by mouth daily. Evening), Disp: 90 capsule, Rfl: 3    Allergies   Allergen Reactions   ??? Infliximab  Seizure activity  Other reaction(s): Other (See Comments)  Seizures       Family History   Problem Relation Age of Onset   ??? Autoimmune disease Maternal Grandmother         Psoriasis   ??? Polycystic kidney disease Sister    ??? Anesthesia problems Neg Hx    ??? Ulcerative colitis Neg Hx    ??? Crohn's disease Neg Hx    ??? Colorectal Cancer Neg Hx    ??? Pancreatic cancer Neg Hx    ??? Esophageal cancer Neg Hx        Social History  Accompanied by mother.    ROS  See HPI. All other systems reviewed are negative.    Physical Exam  BP 112/66  - Pulse 66  - Temp 37.4 ??C (99.3 ??F) (Temporal)  - Resp 16  - Wt 63.5 kg (140 lb)  - SpO2 96%  - BMI 20.09 kg/m??      General Appearance: No acute distress.   Pulmonary: Normal respiratory effort.   Skin:  Midline thoracic superficial wound with healthy wound bed. Decreased area of overall wound diameter to 3.5x2cm. No concern for full thickness wound or infection today.  Covered with island dressing. No TTP. Surrounding skin was moist without dry skin present today. No acute issues identified today                 Assessment and Plan  ESAM GOECKNER is a 41 y.o. male who suffered a 10 ft fall 04/11/21 resulting in multiple spinal fractures & acute spinal cord injury s/p PLIF C2-4, L1-2 decompression laminectomy with reduction of fx T12-L3 PLA with pedicle screw fixation and local autographing on 04/12/21 at [facility], who then had a dehiscence of his incision. He underwent previous debridement and VAC placement by Neurosurgery on 05/20/21 followed by delayed closure by Plastics. His postoperative course was complicated by dehiscence, and he underwent debridement and VAC placement on 07/19/21.  - Overall the midline posterior wound appears to be decreased in overall size and shape when compared to prior evaluation in clinic.  The wound is now only 3.5x2cm and healthy.   - Continue to leave open to air as much as possible when home  - Cover with [place] dressing when outside of home  - Avoid any pressure on wound  - Avoid hip flexion, upper extremity horizontal adduction and scapular protraction to minimize tension on wound  -Photographs were taken and placed in the patient's chart for comparison. Included in note above  - Follow up in 8 week unless concerns arise at which time I can see him back sooner  - The patient may continue Humera as it appears to be treating his Crohns well with no adverse wound healing changes since initiation of therapy  -The patient was in agreement with the plan and all questions were addressed to their satisfaction today.      Wasif Simonich Isaac Bliss, MPAS  [facility]

## 2022-02-11 NOTE — Unmapped (Signed)
OUTPATIENT PHYSICAL THERAPY  DAILY  NOTE    Patient Name: Kerry Mooney  Date of Birth:1980-09-02  Date: 02/11/2022  Session Number: 22 (3/10 to reassessment)  Therapy Diagnosis:           Referring Pracitioner: Sena Hitch Al*   Occurrence Codes:  Onset of Injury: May 07, 2021  Date of Evaluation: 10/28/2021  Date Treatment Began: 11/01/21    Certification Dates: [facility] [employer] 10/28/21 - 01/27/22  [facility] Financial Assistance (09/16/2021 - 03/20/2023)      Primary Therapist: Rhea Belton, PT, DPT    Precautions:  Open surgical wound on back, actively seeing [facility] plastic surgery for wound care.    SCI Level: C2 ASIA D SCI    COVID Mask use: Patient wore a mask for the entire therapy session. and Patient and/or caregiver opted not to wear mask for duration or majority of session per updated clinic mask policy.      Assessessment    Patient reports improved strength since using FES Bike.  Today's session focused on ambulation without AFO.  Gait improving with no evidence of  knee hyperextension and improved foot clearance x 200 feet.  As fatigued, patient exhibits toe drag x 2 . With need for CGA to recover.  .  Patient tolerated   35  minutes of FES cycling at increased resistance with increased force output. The asymmetry between RLE and LLE was slightly greater this session, so pt may benefit from reducing resistance slightly next session and focus on reducing speed to emphasis increased output with RLE to equal that of LLE.  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  Patient will continue to benefit from PT to maximize functional Independence.        Prior Objective Testing  Tests Eval (10/28/21) 11/26/21 11/29/21 Reassessment  12/13/21  Re-assessment 01/24/22   TUG 15.9 seconds, RW   23.85 sec with no UE support  21.6 sec ( no AD)  17.04 seconds no AD   5x STS 28.6 seconds  20.91 sec  15.04 sec  15.3 seconds (no AD)   WISCI II Level 6 (walker, brace on R, Min A - SBA x 1) Level 12 - (two crutches, brace on right, no assist)  Level 15 - Brace - Once Crutch , No Assistance > 10 M Level 15 - brace, one crutch, no assistance   10 MWT  TBD  0.62 m/s with walker 0.47 m/s 10 m/ 15.86 sec     0.63 m/s without the walker Average with rollator: 0.93 m/s  Average without rollator: 0.83 m/s      Problem List: decreased UE and LE strength, impaired balance, decreased ROM, impaired posture, deconditioning, impaired functional mobility, impaired ADLs and edema    Plan     Next session: [facility] for  Sit to stand and squats  continue FES and follow with ambulation    Home Exercise ID: [id number]    Frequency/Duration of visits: per re-assessment 01/26/22: 2x/week for 8 weeks    Goals:  Patient/Family Goals:I want to be strong enough to walk without the walker and make walking my primary means of getting around.     Goals Met:    Patient will be able to properly demonstrate current HEP independently x1 in clinic to build upon functional gains in PT.  Met 11/29/21  Patient will ambulate  500 feet with RW, AFO donned on R LE, and decreased gait deviation to allow return to household/community ambulation. Met 6/68/23  Patient will ascend and descend 3 stairs with CGA assistance, with  1 or no handrails and safe technique demonstrating step through pattern in order to increase independence with household/community mobility.Met 11/29/21  Patient will be able to perform floor transfer with bilateral UE support and supervision assist.  Met 11/29/21  Patient will be able to perform floor transfer with no external support and SBA.   -  Met  11/26/21  Patient will complete .  Met 11/29/21      Long Term Goals:  Patient will improve Timed Up and Go score to < 12 seconds to further reduce fall risk. Not Met 01/26/22  Patient will ambulate >1.0 meters/second to decrease fall risk and improve household/community ambulation Not Met 01/26/22  Patient will use ambulation with LRAD as primary means of mobility more than 60% of the time.  Met 01/12/22  - Stopped using Power WC at home .   Patient will be able to ambulate 1,045ft with minimal gait deviations and AFO donned on R LE to return to community ambulation and demonstrate improved ambulation endurance. Met  01/19/22  Patient will ascend and descend 6 stairs with SBA assistance, with one  handrails and safe technique demonstrating step through pattern in order to increase independence with household/community mobility.  Met 01/19/22 - needs rail  New goal 01/26/22: Patient will ambulate at least 401 meters during with LRAD and AFO donned on RLE to demonstrate increased sustained gait speed to match that of the mean for community ambulators with SCI.  New goal 01/26/22: Patient will stop using transport chair for medical appointments and ambulate into clinic with LRAD for 2 consecutive weeks to demonstrate increased participation as a community ambulator.  Met  02/11/22  Walking into apts with [place]   New goal 01/26/22:  Patient will ambulate for all mobility with LRAD and minimal gait deviations with AFO donned for 2 consecutive weeks to demonstrate return to full participation in community as community ambulator.  Patient will improve TUG score to < 10 seconds to further reduce falls risk.     Subjective:   Pt states: I felt pretty good after last session and did not really have muscle soreness. Legs have been feeling a little tired because of overall doing more and not using powerchair apart from getting in/out of shower     Reason for Referral/History of Present Condition/Onset of injury/exacerbation:   41 y.o. male presents with PMH of C2 ASIA D SCI from 43ft fall through attic (04/11/2021), Patient has a PMH of seizures, ankylosing spondylitis, TBI and crohns disease. Per MD note, patient was admitted to [facility] from 10/18-10/24/2022, readmitted to [facility] on 05/03/21 for assessment of cervical wound due to drainage and concern for infection and readmitted to [facility] from 06/01/21 to 06/16/21 before requiring transfer back to acute for wound debridement/washout. Patient was readmitted again to [facility] from 06/25/21-07/18/21 before being transferred back to [facility] main for wound washout      Objective:      Pain:  no complaints  Falls: None     TA: 20 min   Signs of LE strength since FES.  Patient has 3-/5  right ankle DF ,   Patient able to walk x 150 feet without AFO and AD with CGA x 1 due to foot drop x 2 as noted above.  Treatment Rendered:      NMR: 23 min   Set pt up with FES system for cardiovascular load, LE muscular strengthening and endurance, decreased tone/spasticity and increased circulation. Patient completed 23  mins of LE FES cycling on the RT300 for 4.59 miles with an average 30.4 Watts    Warm Up time: 2 min  Average Resistance: 4.5 Nm  Average Asymmetry: 21%  Left   Set Speed: 40  rpm  Motor Supported Time: 00:28 min  Time without Motor: 22:23 min  Control Speed 35 rpm      Muscles stimulated:   LEAD 1: Ant Tip Right @ 54 mA,   LEAD 3: Quad Right  @ 34 mA,   LEAD 4: Hamstring Right  @ 38 mA,     Good muscle contractions were observed in all muscle groups.  No adverse skin issues were noted under electrode placement.  For the full report, please refer to [email or web address]     Pt ID: [id number]  Pin: [phone]       Home Exercise Program:    - sit<>stand with UE extended, feet staggered for L disavantaged, eccentric lowering emphasized   - at half wall for postural support:    - hip abd with glut med focus (2 UE support)    - hip flex (1 UE support)    - hip flex into extension (straight knee, 1 UE support)  - clam shell  - hip abd (straight leg)  - modified bird dog  - Postural Exercises:   Shoulder ER with hands behind head  Horizontal Abd/Add  Shoulder Flexion: I and Y  TA - Alternating Heel slides,  TA - Alternating Hip Abd/ Add, Supine bicycle legs    [employer] [id number]      Total Treatment Time:  43 min   TA:   NMR: 23 min    I attest that I have reviewed the above information.  Signed: Vira Blanco, PT, DPT  02/11/2022 1:35 PM

## 2022-02-16 NOTE — Unmapped (Signed)
OUTPATIENT PHYSICAL THERAPY  DAILY  NOTE    Patient Name: Kerry Mooney  Date of Birth:[birth year]  Date: 02/15/2022  Session Number: 23 (4/10 to reassessment)  Therapy Diagnosis:           Referring Pracitioner: Sena Hitch Al*   Occurrence Codes:  Onset of Injury: 05-08-21  Date of Evaluation: 10/28/2021  Date Treatment Began: 11/01/21    Certification Dates: [facility] Allianace 10/28/21 - 01/27/22  [facility] Financial Assistance (09/16/2021 - 03/20/2023)      Primary Therapist: Rhea Belton, PT, DPT    Precautions:  Open surgical wound on back, actively seeing [facility] plastic surgery for wound care.    SCI Level: C2 ASIA D SCI    COVID Mask use: Patient wore a mask for the entire therapy session. and Patient and/or caregiver opted not to wear mask for duration or majority of session per updated clinic mask policy.      Assessessment    Today's session involved EXCITE Therapy to work on deep squats and SLS for alternate box tap or cone tap.  Patient states he can really feel the right LE working with the stimulation. .  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  Patient will continue to benefit from PT to maximize functional Independence.        Prior Objective Testing  Tests Eval (10/28/21) 11/26/21 11/29/21 Reassessment  12/13/21  Re-assessment 01/24/22   TUG 15.9 seconds, RW   23.85 sec with no UE support  21.6 sec ( no AD)  17.04 seconds no AD   5x STS 28.6 seconds  20.91 sec  15.04 sec  15.3 seconds (no AD)   WISCI II Level 6 (walker, brace on R, Min A - SBA x 1) Level 12 - (two crutches, brace on right, no assist)  Level 15 - Brace - Once Crutch , No Assistance > 10 M Level 15 - brace, one crutch, no assistance   10 MWT  TBD  0.62 m/s with walker 0.47 m/s 10 m/ 15.86 sec     0.63 m/s without the walker Average with rollator: 0.93 m/s  Average without rollator: 0.83 m/s      Problem List: decreased UE and LE strength, impaired balance, decreased ROM, impaired posture, deconditioning, impaired functional mobility, impaired ADLs and edema    Plan     Next session: Excite Therapy for  Sit to stand and squats  continue FES and follow with ambulation    Home Exercise ID: [id number]    Frequency/Duration of visits: per re-assessment 01/26/22: 2x/week for 8 weeks    Goals:  Patient/Family Goals:I want to be strong enough to walk without the walker and make walking my primary means of getting around.     Goals Met:    Patient will be able to properly demonstrate current HEP independently x1 in clinic to build upon functional gains in PT.  Met 11/29/21  Patient will ambulate 500 feet with RW, AFO donned on R LE, and decreased gait deviation to allow return to household/community ambulation. Met 6/68/23  Patient will ascend and descend 3 stairs with CGA assistance, with  1 or no handrails and safe technique demonstrating step through pattern in order to increase independence with household/community mobility.Met 11/29/21  Patient will be able to perform floor transfer with bilateral UE support and supervision assist.  Met  11/29/21  Patient will be able to perform floor transfer with no external support and SBA.   -  Met  11/26/21  Patient will complete .  Met 11/29/21      Long Term Goals:  Patient will improve Timed Up and Go score to < 12 seconds to further reduce fall risk. Not Met 01/26/22  Patient will ambulate >1.0 meters/second to decrease fall risk and improve household/community ambulation Not Met 01/26/22  Patient will use ambulation with LRAD as primary means of mobility more than 60% of the time.  Met 01/12/22  - Stopped using Power WC at home .   Patient will be able to ambulate 1,060ft with minimal gait deviations and AFO donned on R LE to return to community ambulation and demonstrate improved ambulation endurance. Met  01/19/22  Patient will ascend and descend 6 stairs with SBA assistance, with one  handrails and safe technique demonstrating step through pattern in order to increase independence with household/community mobility.  Met 01/19/22 - needs rail  New goal 01/26/22: Patient will ambulate at least 401 meters during with LRAD and AFO donned on RLE to demonstrate increased sustained gait speed to match that of the mean for community ambulators with SCI.  New goal 01/26/22: Patient will stop using transport chair for medical appointments and ambulate into clinic with LRAD for 2 consecutive weeks to demonstrate increased participation as a community ambulator.  Met  02/11/22  Walking into apts with [employer]   New goal 01/26/22:  Patient will ambulate for all mobility with LRAD and minimal gait deviations with AFO donned for 2 consecutive weeks to demonstrate return to full participation in community as community ambulator.  Patient will improve TUG score to < 10 seconds to further reduce falls risk.     Subjective:   Pt states: I felt pretty good after last session and did not really have muscle soreness. Legs have been feeling a little tired because of overall doing more and not using powerchair apart from getting in/out of shower     Reason for Referral/History of Present Condition/Onset of injury/exacerbation:   41 y.o. male presents with PMH of C2 ASIA D SCI from 25ft fall through attic (04/11/2021), Patient has a PMH of seizures, ankylosing spondylitis, TBI and crohns disease. Per MD note, patient was admitted to [facility] AIR from 10/18-10/24/2022, readmitted to [facility] on 05/03/21 for assessment of cervical wound due to drainage and concern for infection and readmitted to [facility] AIR from 06/01/21 to 06/16/21 before requiring transfer back to acute for wound debridement/washout. Patient was readmitted again to [facility] AIR from 06/25/21-07/18/21 before being transferred back to [facility] main for wound washout      Objective:      Pain:  no complaints  Falls: None     TA: 30 min   Set pt up with FES system for c LE muscular strengthening and muscle endurance, decreased tone/spasticity and increased circulation.    LE Deep Squat      3 x 10 reps with st      Muscles stimulated:   LEAD 1: Ant Tip Right @ 44 mA,   LEAD 3: Quad Right  @ 40mA,   LEAD 4: Hamstring Right  @ 42mA,   Lead 5: Gastroc Right @48  m A    SLS for box tap  3 x 10 reps    Muscles stimulated:   LEAD 1: Ant Tip Right @ 44 mA,   LEAD 3: Quad Right  @  40mA,   LEAD 4: Hamstring Right  @ 42mA,   Lead 5: Gastroc Right @48  m A        Pt ID: [id number]  Pin: [id number]       TUG  with no AD     17.5 sec   16.06 sec      NMR 15 min   Standing balance at counter   SLS x 10 sec with intermittent UE Support.       Home Exercise Program:    - sit<>stand with UE extended, feet staggered for L disavantaged, eccentric lowering emphasized   - at half wall for postural support:    - hip abd with glut med focus (2 UE support)    - hip flex (1 UE support)    - hip flex into extension (straight knee, 1 UE support)  - clam shell  - hip abd (straight leg)  - modified bird dog  - Postural Exercises:   Shoulder ER with hands behind head  Horizontal Abd/Add  Shoulder Flexion: I and Y  TA - Alternating Heel slides,  TA - Alternating Hip Abd/ Add,    Supine bicycle legs    [employer] [id number]      Total Treatment Time:    TA:   NMR:    I attest that I have reviewed the above information.  Signed: Vira Blanco, PT, DPT  02/15/2022 7:54 PM

## 2022-02-17 NOTE — Unmapped (Signed)
OUTPATIENT PHYSICAL THERAPY  DAILY  NOTE    Patient Name: Kerry Mooney  Date of Birth:09-21-1980  Date: 02/17/2022  Session Number: 24 (5/10 to reassessment)  Therapy Diagnosis:           Referring Pracitioner: Sena Hitch Al*   Occurrence Codes:  Onset of Injury: 04-27-21  Date of Evaluation: 10/28/2021  Date Treatment Began: 11/01/21    Certification Dates: [facility] Allianace 10/28/21 - 01/27/22 and [facility] Financial Assistance (09/16/2021 - 03/20/2023)      Primary Therapist: Rhea Belton, PT, DPT    Precautions:  Open surgical wound on back, actively seeing [facility] plastic surgery for wound care.    SCI Level: C2 ASIA D SCI    COVID Mask use: Patient wore a mask for the entire therapy session. and Patient and/or caregiver opted not to wear mask for duration or majority of session per updated clinic mask policy.      Assessessment    Today's session involved functional practice of floor transfers with shoes off and brace off  and exercises to increase right ankle DF activation.  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  Patient will continue to benefit from PT to maximize functional Independence.        Prior Objective Testing  Tests Eval (10/28/21) 11/26/21 11/29/21 Reassessment  12/13/21  Re-assessment 01/24/22   TUG 15.9 seconds, RW   23.85 sec with no UE support  21.6 sec ( no AD)  17.04 seconds no AD   5x STS 28.6 seconds  20.91 sec  15.04 sec  15.3 seconds (no AD)   WISCI II Level 6 (walker, brace on R, Min A - SBA x 1) Level 12 - (two crutches, brace on right, no assist)  Level 15 - Brace - Once Crutch , No Assistance > 10 M Level 15 - brace, one crutch, no assistance   10 MWT  TBD  0.62 m/s with walker 0.47 m/s 10 m/ 15.86 sec     0.63 m/s without the walker Average with rollator: 0.93 m/s  Average without rollator: 0.83 m/s      Problem List: decreased UE and LE strength, impaired balance, decreased ROM, impaired posture, deconditioning, impaired functional mobility, impaired ADLs and edema    Plan     Next session: Excite Therapy for  Sit to stand and squats   and follow with ambulation    Home Exercise ID: [id number]    Frequency/Duration of visits: per re-assessment 01/26/22: 2x/week for 8 weeks    Goals:  Patient/Family Goals:I want to be strong enough to walk without the walker and make walking my primary means of getting around.     Goals Met:    Patient will be able to properly demonstrate current HEP independently x1 in clinic to build upon functional gains in PT.  Met 11/29/21  Patient will ambulate 500 feet with RW, AFO donned on R LE, and decreased gait deviation to allow return to household/community ambulation. Met 6/68/23  Patient will ascend and descend 3 stairs with CGA assistance, with  1 or no handrails and safe technique demonstrating step through pattern in order to increase independence with household/community mobility.Met 11/29/21  Patient will be able to perform floor transfer with bilateral UE support and supervision assist.  Met 11/29/21  Patient will be able to perform floor transfer with no  external support and SBA.   -  Met  11/26/21  Patient will complete .  Met 11/29/21      Long Term Goals:  Patient will improve Timed Up and Go score to < 12 seconds to further reduce fall risk. Not Met 01/26/22  Patient will ambulate >1.0 meters/second to decrease fall risk and improve household/community ambulation Not Met 01/26/22  Patient will use ambulation with LRAD as primary means of mobility more than 60% of the time.  Met 01/12/22  - Stopped using Power WC at home .   Patient will be able to ambulate 1,023ft with minimal gait deviations and AFO donned on R LE to return to community ambulation and demonstrate improved ambulation endurance. Met  01/19/22  Patient will ascend and descend 6 stairs with SBA assistance, with one handrails and safe technique demonstrating step through pattern in order to increase independence with household/community mobility.  Met 01/19/22 - needs rail  New goal 01/26/22: Patient will ambulate at least 401 meters during with LRAD and AFO donned on RLE to demonstrate increased sustained gait speed to match that of the mean for community ambulators with SCI.  New goal 01/26/22: Patient will stop using transport chair for medical appointments and ambulate into clinic with LRAD for 2 consecutive weeks to demonstrate increased participation as a community ambulator.  Met  02/11/22  Walking into apts with [employer]   New goal 01/26/22:  Patient will ambulate for all mobility with LRAD and minimal gait deviations with AFO donned for 2 consecutive weeks to demonstrate return to full participation in community as community ambulator.  Patient will improve TUG score to < 10 seconds to further reduce falls risk.     Subjective:   Pt states: I felt pretty good after last session and did not really have muscle soreness. Legs have been feeling a little tired because of overall doing more and not using powerchair apart from getting in/out of shower     Reason for Referral/History of Present Condition/Onset of injury/exacerbation:   41 y.o. male presents with PMH of C2 ASIA D SCI from 57ft fall through attic (04/11/2021), Patient has a PMH of seizures, ankylosing spondylitis, TBI and crohns disease. Per MD note, patient was admitted to [facility] AIR from 10/18-10/24/2022, readmitted to [facility] on 05/03/21 for assessment of cervical wound due to drainage and concern for infection and readmitted to [facility] AIR from 06/01/21 to 06/16/21 before requiring transfer back to acute for wound debridement/washout. Patient was readmitted again to [facility] AIR from 06/25/21-07/18/21 before being transferred back to [facility] main for wound washout      Objective:      Pain: 0/10  Falls: None    TA: 15 min      Floor transfers with no AFO  to work on balance in tall kneeling to 1/2 kneeling .   Alt 1/2  Kneel with 5 sec hold     1/2 kneel - Ankle DF on back toe x 10     1/2 kneel to staggered stance x 10 each leg with  CGA.   Stand to 1/2 kneel to tall kneel to 1/2 kneel to stand - Reciprocal sequence     Wants to trial DDP Yoga - low impact yoga .      There Exx 30 min   Squat to stand off box 12 inch and 8 inch with Ind 1 x 10 each     Standign Ex no AFO :  Heel raises ,  March with support and soccer ball circles.      Backward stepping without AFO and with AFO  and no AD      Home Exercise Program:    - sit<>stand with UE extended, feet staggered for L disavantaged, eccentric lowering emphasized   - at half wall for postural support:    - hip abd with glut med focus (2 UE support)    - hip flex (1 UE support)    - hip flex into extension (straight knee, 1 UE support)  - clam shell  - hip abd (straight leg)  - modified bird dog  - Postural Exercises:   Shoulder ER with hands behind head  Horizontal Abd/Add  Shoulder Flexion: I and Y  TA - Alternating Heel slides,  TA - Alternating Hip Abd/ Add,    Supine bicycle legs    [employer] [id number]      Total Treatment Time:    TA: 15 min   TE 30 min  I attest that I have reviewed the above information.  Signed: Vira Blanco, PT, DPT  02/17/2022 9:45 AM

## 2022-02-21 NOTE — Unmapped (Signed)
OUTPATIENT PHYSICAL THERAPY  DAILY  NOTE    Patient Name: Kerry Mooney  Date of Birth:[birth year]  Date: 02/21/2022  Session Number: 25 (6/10 to reassessment)  Therapy Diagnosis:           Referring Pracitioner: Caren Griffins*   Occurrence Codes:  Onset of Injury: May 02, 2021  Date of Evaluation: 10/28/2021  Date Treatment Began: 11/01/21    Certification Dates: [facility] Allianace 10/28/21 - 01/27/22 and [facility] Financial Assistance (09/16/2021 - 03/20/2023)      Primary Therapist: Rhea Belton, PT, DPT    Precautions:  Open surgical wound on back, actively seeing [facility] plastic surgery for wound care.    SCI Level: C2 ASIA D SCI    COVID Mask use: Patient wore a mask for the entire therapy session. and Patient and/or caregiver opted not to wear mask for duration or majority of session per updated clinic mask policy.      Assessessment    Today's session focused on continued use of FES and follow up practice with ambulation. Pt continues to tolerate FES well and pt demonstrated improved knee and ankle control during ambulation with no instances of knee hyperextension or buckling on RLE and only one instance of R foot catching without AFO during ambulation after. 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  Patient will continue to benefit from PT to maximize functional Independence.        Prior Objective Testing  Tests Eval (10/28/21) 11/26/21 11/29/21 Reassessment  12/13/21  Re-assessment 01/24/22   TUG 15.9 seconds, RW   23.85 sec with no UE support  21.6 sec ( no AD)  17.04 seconds no AD   5x STS 28.6 seconds  20.91 sec  15.04 sec  15.3 seconds (no AD)   WISCI II Level 6 (walker, brace on R, Min A - SBA x 1) Level 12 - (two crutches, brace on right, no assist)  Level 15 - Brace - Once Crutch , No Assistance > 10 M Level 15 - brace, one crutch, no assistance   10 MWT  TBD  0.62 m/s with walker 0.47 m/s 10 m/ 15.86 sec     0.63 m/s without the walker Average with rollator: 0.93 m/s  Average without rollator: 0.83 m/s      Problem List: decreased UE and LE strength, impaired balance, decreased ROM, impaired posture, deconditioning, impaired functional mobility, impaired ADLs and edema    Plan     Next session: Excite Therapy for  Sit to stand and squats   and follow with ambulation, add obstacles    Home Exercise ID: [id number]    Frequency/Duration of visits: per re-assessment 01/26/22: 2x/week for 8 weeks    Goals:  Patient/Family Goals:I want to be strong enough to walk without the walker and make walking my primary means of getting around.     Goals Met:    Patient will be able to properly demonstrate current HEP independently x1 in clinic to build upon functional gains in PT.  Met 11/29/21  Patient will ambulate 500 feet with RW, AFO donned on R LE, and decreased gait deviation to allow return to household/community ambulation. Met 6/68/23  Patient will ascend and descend 3 stairs with CGA assistance, with  1 or no handrails and safe technique demonstrating step through pattern in order to increase independence with household/community mobility.Met  11/29/21  Patient will be able to perform floor transfer with bilateral UE support and supervision assist.  Met 11/29/21  Patient will be able to perform floor transfer with no external support and SBA.   -  Met  11/26/21  Patient will complete .  Met 11/29/21      Ariea Rochin Term Goals:  Patient will improve Timed Up and Go score to < 12 seconds to further reduce fall risk. Not Met 01/26/22  Patient will ambulate >1.0 meters/second to decrease fall risk and improve household/community ambulation Not Met 01/26/22  Patient will use ambulation with LRAD as primary means of mobility more than 60% of the time.  Met 01/12/22  - Stopped using Power WC at home .   Patient will be able to ambulate 1,014ft with minimal gait deviations and AFO donned on R LE to return to community ambulation and demonstrate improved ambulation endurance. Met  01/19/22  Patient will ascend and descend 6 stairs with SBA assistance, with one  handrails and safe technique demonstrating step through pattern in order to increase independence with household/community mobility.  Met 01/19/22 - needs rail  New goal 01/26/22: Patient will ambulate at least 401 meters during with LRAD and AFO donned on RLE to demonstrate increased sustained gait speed to match that of the mean for community ambulators with SCI.  New goal 01/26/22: Patient will stop using transport chair for medical appointments and ambulate into clinic with LRAD for 2 consecutive weeks to demonstrate increased participation as a community ambulator.  Met  02/11/22  Walking into apts with [employer]   New goal 01/26/22:  Patient will ambulate for all mobility with LRAD and minimal gait deviations with AFO donned for 2 consecutive weeks to demonstrate return to full participation in community as community ambulator.  Patient will improve TUG score to < 10 seconds to further reduce falls risk.     Subjective:   Pt states: Pt reports that he has been feeling really good since starting the stim - feels like he has muscles in his legs     Reason for Referral/History of Present Condition/Onset of injury/exacerbation:   41 y.o. male presents with PMH of C2 ASIA D SCI from 63ft fall through attic (04/11/2021), Patient has a PMH of seizures, ankylosing spondylitis, TBI and crohns disease. Per MD note, patient was admitted to [facility] AIR from 10/18-10/24/2022, readmitted to [facility] on 05/03/21 for assessment of cervical wound due to drainage and concern for infection and readmitted to [facility] AIR from 06/01/21 to 06/16/21 before requiring transfer back to acute for wound debridement/washout. Patient was readmitted again to [facility] AIR from 06/25/21-07/18/21 before being transferred back to [facility] main for wound washout Objective:      Pain: 0/10  Falls: None    TA: 40 minutes  Set pt up with FES Excite system for c LE muscular strengthening and muscle endurance, decreased tone/spasticity and increased circulation.     LE Deep Squat       3 x 10 reps with st        Muscles stimulated:   LEAD 1: Ant Tip Right @ 44 mA,   LEAD 3: Quad Right  @ 40mA,   LEAD 4: Hamstring Right  @ 42mA,   Lead 5: Gastroc Right @48  m A    Pt ID: [id number]  Pin: [id number]    Ambulation following stim:   -forward/backward walking with rollator but without AFO 4x90ft each direction     Ambulation following use  of stim    Home Exercise Program:    - sit<>stand with UE extended, feet staggered for L disavantaged, eccentric lowering emphasized   - at half wall for postural support:    - hip abd with glut med focus (2 UE support)    - hip flex (1 UE support)    - hip flex into extension (straight knee, 1 UE support)  - clam shell  - hip abd (straight leg)  - modified bird dog  - Postural Exercises:   Shoulder ER with hands behind head  Horizontal Abd/Add  Shoulder Flexion: I and Y  TA - Alternating Heel slides,  TA - Alternating Hip Abd/ Add,    Supine bicycle legs    [employer] [id number]      Total Treatment Time:    See above  I attest that I have reviewed the above information.  Signed: Lynford Humphrey, PT, DPT  02/21/2022 9:45 AM

## 2022-02-24 NOTE — Unmapped (Signed)
OUTPATIENT PHYSICAL THERAPY  DAILY  NOTE    Patient Name: Kerry Mooney  Date of Birth:[birth year]  Date: 02/24/2022  Session Number: 26 (7/10 to reassessment)  Therapy Diagnosis:           Referring Pracitioner: Sena Hitch Al*   Occurrence Codes:  Onset of Injury: 05/10/2021  Date of Evaluation: 10/28/2021  Date Treatment Began: 11/01/21    Certification Dates: [facility] 10/28/21 - 01/27/22 and [facility] Financial Assistance (09/16/2021 - 03/20/2023)      Primary Therapist: Rhea Belton, PT, DPT    Precautions:  Open surgical wound on back, actively seeing [facility] plastic surgery for wound care.    SCI Level: C2 ASIA D SCI    COVID Mask use: Patient wore a mask for the entire therapy session. and Patient and/or caregiver opted not to wear mask for duration or majority of session per updated clinic mask policy.      Assessessment    Today's session focused on continued use of FES. Pt was more challenged by stance during step taps with LLE than by squatting activity. Pt would benefit from practicing stepping over obstacles following FES step tap activity. 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  Patient will continue to benefit from PT to maximize functional Independence.        Prior Objective Testing  Tests Eval (10/28/21) 11/26/21 11/29/21 Reassessment  12/13/21  Re-assessment 01/24/22   TUG 15.9 seconds, RW   23.85 sec with no UE support  21.6 sec ( no AD)  17.04 seconds no AD   5x STS 28.6 seconds  20.91 sec  15.04 sec  15.3 seconds (no AD)   WISCI II Level 6 (walker, brace on R, Min A - SBA x 1) Level 12 - (two crutches, brace on right, no assist)  Level 15 - Brace - Once Crutch , No Assistance > 10 M Level 15 - brace, one crutch, no assistance   10 MWT  TBD  0.62 m/s with walker 0.47 m/s 10 m/ 15.86 sec     0.63 m/s without the walker Average with rollator: 0.93 m/s  Average without rollator: 0.83 m/s      Problem List: decreased UE and LE strength, impaired balance, decreased ROM, impaired posture, deconditioning, impaired functional mobility, impaired ADLs and edema    Plan     Next session: Excite Therapy for  Sit to stand and squats   and follow with ambulation, add obstacles    Home Exercise ID: [id number]    Frequency/Duration of visits: per re-assessment 01/26/22: 2x/week for 8 weeks    Goals:  Patient/Family Goals:I want to be strong enough to walk without the walker and make walking my primary means of getting around.     Goals Met:    Patient will be able to properly demonstrate current HEP independently x1 in clinic to build upon functional gains in PT.  Met 11/29/21  Patient will ambulate 500 feet with RW, AFO donned on R LE, and decreased gait deviation to allow return to household/community ambulation. Met 6/68/23  Patient will ascend and descend 3 stairs with CGA assistance, with  1 or no handrails and safe technique demonstrating step through pattern in order to increase independence with household/community mobility.Met 11/29/21  Patient will be able to perform floor transfer with bilateral UE support and supervision assist.  Met 11/29/21  Patient will be able to perform floor transfer with no external support and SBA.   -  Met  11/26/21  Patient will complete .  Met 11/29/21      Shaivi Rothschild Term Goals:  Patient will improve Timed Up and Go score to < 12 seconds to further reduce fall risk. Not Met 01/26/22  Patient will ambulate >1.0 meters/second to decrease fall risk and improve household/community ambulation Not Met 01/26/22  Patient will use ambulation with LRAD as primary means of mobility more than 60% of the time.  Met 01/12/22  - Stopped using Power WC at home .   Patient will be able to ambulate 1,05ft with minimal gait deviations and AFO donned on R LE to return to community ambulation and demonstrate improved ambulation endurance. Met  01/19/22  Patient will ascend and descend 6 stairs with SBA assistance, with one  handrails and safe technique demonstrating step through pattern in order to increase independence with household/community mobility.  Met 01/19/22 - needs rail  New goal 01/26/22: Patient will ambulate at least 401 meters during with LRAD and AFO donned on RLE to demonstrate increased sustained gait speed to match that of the mean for community ambulators with SCI.  New goal 01/26/22: Patient will stop using transport chair for medical appointments and ambulate into clinic with LRAD for 2 consecutive weeks to demonstrate increased participation as a community ambulator.  Met  02/11/22  Walking into apts with Rollater   New goal 01/26/22:  Patient will ambulate for all mobility with LRAD and minimal gait deviations with AFO donned for 2 consecutive weeks to demonstrate return to full participation in community as community ambulator.  Patient will improve TUG score to < 10 seconds to further reduce falls risk.     Subjective:   Pt states: Pt reports that he is doing well. Pt reports no pain today. Pt reports he is a little more tired     Reason for Referral/History of Present Condition/Onset of injury/exacerbation:   41 y.o. male presents with PMH of C2 ASIA D SCI from 78ft fall through attic (04/11/2021), Patient has a PMH of seizures, ankylosing spondylitis, TBI and crohns disease. Per MD note, patient was admitted to [facility] AIR from 10/18-10/24/2022, readmitted to [facility] on 05/03/21 for assessment of cervical wound due to drainage and concern for infection and readmitted to [facility] AIR from 06/01/21 to 06/16/21 before requiring transfer back to acute for wound debridement/washout. Patient was readmitted again to [facility] AIR from 06/25/21-07/18/21 before being transferred back to [facility] main for wound washout      Objective:      Pain: 0/10  Falls: None    TA: 40 minutes  Set pt up with FES Excite system for c LE muscular strengthening and muscle endurance, decreased tone/spasticity and increased circulation.     LE Deep Squat       3 x 10 reps    LLE step taps (stance on RLE) 3x10        Muscles stimulated:   LEAD 1: Ant Tip Right @ 44 mA,   LEAD 3: Quad Right  @ 40mA,   LEAD 4: Hamstring Right  @ 42mA,   Lead 5: Gastroc Right @48  m A    Pt ID: [id number]  Pin: [phone]      Home Exercise Program:    - sit<>stand with UE extended, feet staggered for L disavantaged, eccentric lowering emphasized   - at half wall for postural  support:    - hip abd with glut med focus (2 UE support)    - hip flex (1 UE support)    - hip flex into extension (straight knee, 1 UE support)  - clam shell  - hip abd (straight leg)  - modified bird dog  - Postural Exercises:   Shoulder ER with hands behind head  Horizontal Abd/Add  Shoulder Flexion: I and Y  TA - Alternating Heel slides,  TA - Alternating Hip Abd/ Add,    Supine bicycle legs    [employer] [id number]      Total Treatment Time:    See above  I attest that I have reviewed the above information.  Signed: Lynford Humphrey, PT, DPT  02/24/2022 2:17 PM

## 2022-02-25 MED ORDER — AZATHIOPRINE 50 MG TABLET
ORAL_TABLET | 0 refills | 0 days
Start: 2022-02-25 — End: ?

## 2022-02-28 NOTE — Unmapped (Signed)
[facility] Refill Coordination Note    Specialty Medication(s) to be Shipped:   Inflammatory Disorders: Humira    Other medication(s) to be shipped: No additional medications requested for fill at this time     Kerry Mooney, DOB: 07-Apr-1981  Phone: [phone] (home)       All above HIPAA information was verified with patient.     Was a Nurse, learning disability used for this call? No    Completed refill call assessment today to schedule patient's medication shipment from the [facility] [phone]).  All relevant notes have been reviewed.     Specialty medication(s) and dose(s) confirmed: Regimen is correct and unchanged.   Changes to medications: Kerry Mooney reports no changes at this time.  Changes to insurance: No  New side effects reported not previously addressed with a pharmacist or physician: None reported  Questions for the pharmacist: No    Confirmed patient received a Conservation officer, historic buildings and a Surveyor, mining with first shipment. The patient will receive a drug information handout for each medication shipped and additional FDA Medication Guides as required.       DISEASE/MEDICATION-SPECIFIC INFORMATION        For patients on injectable medications: Patient currently has 1 doses left.  Next injection is scheduled for 03/05/22.    SPECIALTY MEDICATION ADHERENCE     Medication Adherence    Patient reported X missed doses in the last month: 0  Specialty Medication: HUMIRA(CF) PEN 40 mg/0.4 mL injection  Patient is on additional specialty medications: No                          Were doses missed due to medication being on hold? No    Humira 40/0.4 mg/ml: 5 days of medicine on hand        REFERRAL TO PHARMACIST     Referral to the pharmacist: Not needed      [facility]     Shipping address confirmed in Epic.     Delivery Scheduled: Yes, Expected medication delivery date: 03/04/22.     Medication will be delivered via Same Day Courier to the prescription address in Epic WAM.    Willette Pa   [facility] Specialty Technician

## 2022-03-02 ENCOUNTER — Ambulatory Visit: Admit: 2022-03-02 | Discharge: 2022-03-03 | Payer: PRIVATE HEALTH INSURANCE

## 2022-03-03 ENCOUNTER — Ambulatory Visit: Admit: 2022-03-03 | Payer: PRIVATE HEALTH INSURANCE

## 2022-03-03 ENCOUNTER — Ambulatory Visit: Admit: 2022-03-03 | Discharge: 2022-03-26 | Payer: PRIVATE HEALTH INSURANCE

## 2022-03-03 NOTE — Unmapped (Signed)
OUTPATIENT PHYSICAL THERAPY  DAILY  NOTE    Patient Name: Kerry Mooney  Date of Birth:April 18, 1981  Date: 03/03/2022  Session Number: 27 (8/10 to reassessment)  Therapy Diagnosis:           Referring Pracitioner: Sena Hitch Al*   Occurrence Codes:  Onset of Injury: 2021-05-05  Date of Evaluation: 10/28/2021  Date Treatment Began: 11/01/21    Certification Dates: [facility] 10/28/21 - 01/27/22 and [facility] Financial Assistance (09/16/2021 - 03/20/2023)      Primary Therapist: Rhea Belton, PT, DPT    Precautions:  Open surgical wound on back, actively seeing [facility] plastic surgery for wound care.    SCI Level: C2 ASIA D SCI    COVID Mask use: Patient wore a mask for the entire therapy session. and Patient and/or caregiver opted not to wear mask for duration or majority of session per updated clinic mask policy.      Assessessment    Today's session focused on continued use of FES, followed by ambulation with obstacle negotiation. Pt tolerated this activity well using UUE support and required CGA for stability due to tendency not to anteriorly shift weight onto stance limb after stepping over obstacles. Pt would benefit from continued practice of higher level community mobility tasks to help pt return to being a full community ambulator. 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  Patient will continue to benefit from PT to maximize functional Independence.        Prior Objective Testing  Tests Eval (10/28/21) 11/26/21 11/29/21 Reassessment  12/13/21  Re-assessment 01/24/22   TUG 15.9 seconds, RW   23.85 sec with no UE support  21.6 sec ( no AD)  17.04 seconds no AD   5x STS 28.6 seconds  20.91 sec  15.04 sec  15.3 seconds (no AD)   WISCI II Level 6 (walker, brace on R, Min A - SBA x 1) Level 12 - (two crutches, brace on right, no assist)  Level 15 - Brace - Once Crutch , No Assistance > 10 M Level 15 - brace, one crutch, no assistance   10 MWT  TBD  0.62 m/s with walker 0.47 m/s 10 m/ 15.86 sec     0.63 m/s without the walker Average with rollator: 0.93 m/s  Average without rollator: 0.83 m/s      Problem List: decreased UE and LE strength, impaired balance, decreased ROM, impaired posture, deconditioning, impaired functional mobility, impaired ADLs and edema    Plan     Next session:  pt bringing in list of community mobility tasks that are challenging to practice in therapy    Home Exercise ID: [id number]    Frequency/Duration of visits: per re-assessment 01/26/22: 2x/week for 8 weeks    Goals:  Patient/Family Goals:I want to be strong enough to walk without the walker and make walking my primary means of getting around.     Goals Met:    Patient will be able to properly demonstrate current HEP independently x1 in clinic to build upon functional gains in PT.  Met 11/29/21  Patient will ambulate 500 feet with RW, AFO donned on R LE, and decreased gait deviation to allow return to household/community ambulation. Met 6/68/23  Patient will ascend and descend 3 stairs with CGA assistance, with  1 or no handrails and safe technique demonstrating step  through pattern in order to increase independence with household/community mobility.Met 11/29/21  Patient will be able to perform floor transfer with bilateral UE support and supervision assist.  Met 11/29/21  Patient will be able to perform floor transfer with no external support and SBA.   -  Met  11/26/21  Patient will complete .  Met 11/29/21      Neelie Welshans Term Goals:  Patient will improve Timed Up and Go score to < 12 seconds to further reduce fall risk. Not Met 01/26/22  Patient will ambulate >1.0 meters/second to decrease fall risk and improve household/community ambulation Not Met 01/26/22  Patient will use ambulation with LRAD as primary means of mobility more than 60% of the time.  Met 01/12/22  - Stopped using Power WC at home .   Patient will be able to ambulate 1,042ft with minimal gait deviations and AFO donned on R LE to return to community ambulation and demonstrate improved ambulation endurance. Met  01/19/22  Patient will ascend and descend 6 stairs with SBA assistance, with one  handrails and safe technique demonstrating step through pattern in order to increase independence with household/community mobility.  Met 01/19/22 - needs rail  New goal 01/26/22: Patient will ambulate at least 401 meters during with LRAD and AFO donned on RLE to demonstrate increased sustained gait speed to match that of the mean for community ambulators with SCI.  New goal 01/26/22: Patient will stop using transport chair for medical appointments and ambulate into clinic with LRAD for 2 consecutive weeks to demonstrate increased participation as a community ambulator.  Met  02/11/22  Walking into apts with Rollater   New goal 01/26/22:  Patient will ambulate for all mobility with LRAD and minimal gait deviations with AFO donned for 2 consecutive weeks to demonstrate return to full participation in community as community ambulator.  Patient will improve TUG score to < 10 seconds to further reduce falls risk.     Subjective:   Pt states: Pt reports that he is doing well. He has been as active as possible, going out to restaurants. Took rollator to MRI yesterday. Pt reports twisting R ankle when walking around house without brace and with loose shoes. Pt reports no pain today. Pt reports he is a little more tired     Reason for Referral/History of Present Condition/Onset of injury/exacerbation:   41 y.o. male presents with PMH of C2 ASIA D SCI from 28ft fall through attic (04/11/2021), Patient has a PMH of seizures, ankylosing spondylitis, TBI and crohns disease. Per MD note, patient was admitted to [facility] AIR from 10/18-10/24/2022, readmitted to [facility] on 05/03/21 for assessment of cervical wound due to drainage and concern for infection and readmitted to [facility] from 06/01/21 to 06/16/21 before requiring transfer back to acute for wound debridement/washout. Patient was readmitted again to [facility] AIR from 06/25/21-07/18/21 before being transferred back to [facility] main for wound washout      Objective:      Pain: 0/10  Falls: None    TA: 40 minutes  Set pt up with FES Excite system for c LE muscular strengthening and muscle endurance, decreased tone/spasticity and increased circulation.       LLE step taps (stance on RLE) 1x10;         Muscles stimulated:   LEAD 1: Ant Tip Right @ 44 mA,   LEAD 3: Quad Right  @ 40mA,   LEAD 4: Hamstring Right  @ 42mA,   Lead 5:  Gastroc Right @48  m A    RLE step taps (LLE stance) 2x10  LEAD 2: R QUADS  LEAD 4: R HAMSTRINGS  LEAD 5: R TIB ANT  LEAD 6:  R HIP FLEXOR    Pt ID: [id number]  Pin: [phone]    Followed with ambulation over obstacles    -practiced first at half-wall with UUE support x3 6 hurdles - 2x with RLE first, 2x with LLE first, cues for anterior weight shift over obstacle with CGA   -practiced on straight away of track 6 obstacles, UUE support on rollator next to hurdles, x2 with RLE first, x2 with LLE first - with CGA      Home Exercise Program:    - sit<>stand with UE extended, feet staggered for L disavantaged, eccentric lowering emphasized   - at half wall for postural support:    - hip abd with glut med focus (2 UE support)    - hip flex (1 UE support)    - hip flex into extension (straight knee, 1 UE support)  - clam shell  - hip abd (straight leg)  - modified bird dog  - Postural Exercises:   Shoulder ER with hands behind head  Horizontal Abd/Add  Shoulder Flexion: I and Y  TA - Alternating Heel slides,  TA - Alternating Hip Abd/ Add,    Supine bicycle legs    [employer] [id number]      Total Treatment Time:    See above  I attest that I have reviewed the above information.  Signed: Lynford Humphrey, PT, DPT  03/03/2022 9:28 AM

## 2022-03-04 MED FILL — HUMIRA PEN CITRATE FREE 40 MG/0.4 ML: SUBCUTANEOUS | 28 days supply | Qty: 4 | Fill #1

## 2022-03-08 NOTE — Unmapped (Signed)
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  Impaired Mobility and ADLS  Spondylosis        Referring Pracitioner: Sena Hitch Al*   Occurrence Codes:  Onset of Injury: 29-Apr-2021  Date of Evaluation: 10/28/2021  Date Treatment Began: 11/01/21    Certification Dates: [facility] [employer]ace 10/28/21 - 01/27/22 and [facility] Financial Assistance (09/16/2021 - 03/20/2023)      Primary Therapist: Rhea Belton, PT, DPT    Precautions:  Open surgical wound on back, actively seeing [facility] plastic surgery for wound care.    SCI Level: C2 ASIA D SCI    COVID Mask use: Patient wore a mask for the entire therapy session. and Patient and/or caregiver opted not to wear mask for duration or majority of session per updated clinic mask policy.      Assessessment    Today's session focused on continued use of FES bike.  Patient reports he is getting stronger.   Patient was able to tolerate ambulation  followed by obstacle negotiation. Patient would benefit from further skilled physical therapy to maximize pt functional independence increase right LE strength to that of left foot      Prior Objective Testing  Tests Eval (10/28/21) 11/26/21 11/29/21 Reassessment  12/13/21  Re-assessment 01/24/22   TUG 15.9 seconds, RW   23.85 sec with no UE support  21.6 sec ( no AD)  17.04 seconds no AD   5x STS 28.6 seconds  20.91 sec  15.04 sec  15.3 seconds (no AD)   WISCI II Level 6 (walker, brace on R, Min A - SBA x 1) Level 12 - (two crutches, brace on right, no assist)  Level 15 - Brace - Once Crutch , No Assistance > 10 M Level 15 - brace, one crutch, no assistance   10 MWT  TBD  0.62 m/s with walker 0.47 m/s 10 m/ 15.86 sec     0.63 m/s without the walker Average with rollator: 0.93 m/s  Average without rollator: 0.83 m/s      Problem List: decreased UE and LE strength, impaired balance, decreased ROM, impaired posture, deconditioning, impaired functional mobility, impaired ADLs and edema    Plan     Next session:  Asked patient to bring a list of community mobility tasks to work on.      Home Exercise ID: [id number]    Frequency/Duration of visits: per re-assessment 01/26/22: 2x/week for 8 weeks    Goals:  Patient/Family Goals:I want to be strong enough to walk without the walker and make walking my primary means of getting around.     Goals Met:    Patient will be able to properly demonstrate current HEP independently x1 in clinic to build upon functional gains in PT.  Met 11/29/21  Patient will ambulate 500 feet with RW, AFO donned on R LE, and decreased gait deviation to allow return to household/community ambulation. Met 6/68/23  Patient will ascend and descend 3 stairs with CGA assistance, with  1 or no handrails and safe technique demonstrating step through pattern in order to increase independence with household/community mobility.Met 11/29/21  Patient will be able to perform floor transfer with bilateral UE support and supervision assist.  Met 11/29/21  Patient will be able to perform floor transfer with no external support and SBA.   -  Met  11/26/21  Patient will complete .  Met 11/29/21      Long Term Goals:  Patient will improve Timed Up and Go score to < 12 seconds to further reduce fall risk. Not Met 01/26/22  Patient will ambulate >1.0 meters/second to decrease fall risk and improve household/community ambulation Not Met 01/26/22  Patient will use ambulation with LRAD as primary means of mobility more than 60% of the time.  Met 01/12/22  - Stopped using Power WC at home .   Patient will be able to ambulate 1,062ft with minimal gait deviations and AFO donned on R LE to return to community ambulation and demonstrate improved ambulation endurance. Met  01/19/22  Patient will ascend and descend 6 stairs with SBA assistance, with one  handrails and safe technique demonstrating step through pattern in order to increase independence with household/community mobility.  Met 01/19/22 - needs rail  New goal 01/26/22: Patient will ambulate at least 401 meters during with LRAD and AFO donned on RLE to demonstrate increased sustained gait speed to match that of the mean for community ambulators with SCI.  New goal 01/26/22: Patient will stop using transport chair for medical appointments and ambulate into clinic with LRAD for 2 consecutive weeks to demonstrate increased participation as a community ambulator.  Met  02/11/22  Walking into apts with [employer]ater   New goal 01/26/22:  Patient will ambulate for all mobility with LRAD and minimal gait deviations with AFO donned for 2 consecutive weeks to demonstrate return to full participation in community as community ambulator.  Patient will improve TUG score to < 10 seconds to further reduce falls risk.     Subjective:   Pt states: Pt reports that he is doing well. He has been as active as possible, going out to restaurants. Took rollator to MRI yesterday. Pt reports twisting R ankle when walking around house without brace and with loose shoes. Pt reports no pain today. Pt reports he is a little more tired     Reason for Referral/History of Present Condition/Onset of injury/exacerbation:   41 y.o. 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 Per MD note, patient was admitted to [facility] AIR from 10/18-10/24/2022, readmitted to [facility] on 05/03/21 for assessment of cervical wound due to drainage and concern for infection and readmitted to [facility] AIR from 06/01/21 to 06/16/21 before requiring transfer back to acute for wound debridement/washout. Patient was readmitted again to [facility] AIR from 06/25/21-07/18/21 before being transferred back to [facility] main for wound washout      Objective:      Pain: 0/10  Falls: None    TE: 40 min      Ankle DF x  20 rep right foot   Standing ham curl on right   Lunges       Muscle strength testing    Right Hip Flexor 3-/5  Right Quad 5/5  Right Ham 4+/5  Right ankle DF 3-/5    Set pt up with FES Bike for  right LE muscular strengthening and muscle endurance, decreased tone/spasticity and increased circulation.       Distance Traveled 5.13 miles  Energy Expended 10.5 K Cal  Energy Per Hour 31.5 Kcal/hour  Average Power: 36.6 W  Average AssymetryLeft 10 %  Total Therapy time23:02   Time Active  23:02 min   Time on Motor: 0 sec  Muscles stimulated:   LEAD 1: Ant Tip Right @ 44 mA,   LEAD 3: Quad Right  @ 40mA,   LEAD 4: Hamstring Right  @ 42mA,      FES Bike x  24 minutes         Pt ID: [id number]  Pin: [phone]           Home Exercise Program:    - sit<>stand with UE extended, feet staggered for L disavantaged, eccentric lowering emphasized   - at half wall for postural support:    - hip abd with glut med focus (2 UE support)    - hip flex (1 UE support)    - hip flex into extension (straight knee, 1 UE support)  - clam shell  - hip abd (straight leg)  - modified bird dog  - Postural Exercises:   Shoulder ER with hands behind head  Horizontal Abd/Add  Shoulder Flexion: I and Y  TA - Alternating Heel slides,  TA - Alternating Hip Abd/ Add,    Supine bicycle legs          [employer] [id number]      Total Treatment Time:  40 min      TE 40 min     I attest that I have reviewed the above information.  Signed: Vira Blanco, PT, DPT  03/08/2022 7:27 PM

## 2022-03-09 MED ORDER — AZATHIOPRINE 50 MG TABLET
ORAL_TABLET | Freq: Every day | ORAL | 0 refills | 90 days | Status: CP
Start: 2022-03-09 — End: 2022-06-07

## 2022-03-10 NOTE — Unmapped (Signed)
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    SCI Level: C2 ASIA D SCI    COVID Mask use: Patient wore a mask for the entire therapy session. and Patient and/or caregiver opted not to wear mask for duration or majority of session per updated clinic mask policy.      Assessessment    Today's session focused on establishing a community based HEP to continue upon discharge from PT.  Patient is able to Ind set up and adjust machine.  Performs 3 x 10 reps of leg press and Calf raises on leg press machine. Patient given tips for strengtheniing and building aerobic endurance.  Patient instructed on modifications that he could make to recumbent bike to allow him to pedal without his foot falling off the pedal.  Patient tolerates 20 min on recumbent bike with resistance 5.5 and RPM 85.      Prior Objective Testing  Tests Eval (10/28/21) 11/26/21 11/29/21 Reassessment  12/13/21  Re-assessment 01/26/22   TUG 15.9 seconds, RW   23.85 sec with no UE support  21.6 sec ( no AD)  17.04 seconds no AD   5x STS 28.6 seconds  20.91 sec  15.04 sec  15.3 seconds (no AD)   WISCI II Level 6 (walker, brace on R, Min A - SBA x 1) Level 12 - (two crutches, brace on right, no assist)  Level 15 - Brace - Once Crutch , No Assistance > 10 M Level 15 - brace, one crutch, no assistance   10 MWT  TBD  0.62 m/s with walker 0.47 m/s 10 m/ 15.86 sec     0.63 m/s without the walker Average with rollator: 0.93 m/s  Average without rollator: 0.83 m/s      Problem List: decreased UE and LE strength, impaired balance, decreased ROM, impaired posture, deconditioning, impaired functional mobility, impaired ADLs and edema    Plan     Next session:  FES Bike , followed by a session of TUG, 10 MWT and    Home Exercise ID: [id number]    Frequency/Duration of visits: per re-assessment 01/26/22: 2x/week for 8 weeks    Goals:  Patient/Family Goals:I want to be strong enough to walk without the walker and make walking my primary means of getting around.     Goals Met:    Patient will be able to properly demonstrate current HEP independently x1 in clinic to build upon functional gains in PT.  Met 11/29/21  Patient will ambulate 500 feet with RW, AFO donned on R LE, and decreased gait deviation to allow return to household/community ambulation. Met 6/68/23  Patient will ascend and descend 3 stairs with CGA assistance, with  1 or no handrails and safe technique demonstrating step through pattern in order to increase independence with household/community mobility.Met 11/29/21  Patient will be able to perform floor transfer with bilateral UE support and supervision assist.  Met 11/29/21  Patient will be able to perform floor transfer with no external support and SBA.   -  Met  11/26/21  Patient will complete .  Met 11/29/21  Patient will use ambulation with LRAD as primary means of mobility more than 60% of the time.  Met 01/12/22  -   Patient will be able to ambulate 1,039ft with minimal gait deviations and AFO donned on R LE to return to community ambulation and demonstrate improved ambulation endurance. Met  01/19/22  Patient will ascend and descend 6 stairs with SBA assistance, with one  handrails and safe technique demonstrating step through pattern in order to increase independence with household/community mobility.  Met 01/19/22 - needs rail  Patient will stop using transport chair for medical appointments and ambulate into clinic with LRAD for 2 consecutive weeks to demonstrate increased participation as a community ambulator.  Met  02/11/22  Walking into apts with [employer]ater     Long Term Goals:  Patient will improve Timed Up and Go score to < 12 seconds to further reduce fall risk. Not Met 01/26/22  Patient will ambulate >1.0 meters/second to decrease fall risk and improve household/community ambulation Not Met 01/26/22  New goal 01/26/22: Patient will ambulate at least 401 meters during with LRAD and AFO donned on RLE to demonstrate increased sustained gait speed to match that of the mean for community ambulators with SCI.  New goal 01/26/22:  Patient will ambulate for all mobility with LRAD and minimal gait deviations with AFO donned for 2 consecutive weeks to demonstrate return to full participation in community as community ambulator.  Patient will improve TUG score to < 10 seconds to further reduce falls risk.         ubjective:   Pt states: Pt reports that he is doing well. He has been as active as possible, going out to restaurants with no Rollater. Pt reports ankle is improved     Reason for Referral/History of Present Condition/Onset of injury/exacerbation:   41 y.o. male presents with PMH of C2 ASIA D SCI from 62ft fall through attic (04/11/2021), Patient has a PMH of seizures, ankylosing spondylitis, TBI and crohns disease. Per MD note, patient was admitted to [facility] from 10/18-10/24/2022, readmitted to [facility] on 05/03/21 for assessment of cervical wound due to drainage and concern for infection and readmitted to [facility] AIR from 06/01/21 to 06/16/21 before requiring transfer back to acute for wound debridement/washout. Patient was readmitted again to [facility] AIR from 06/25/21-07/18/21 before being transferred back to [facility] main for wound washout      Objective:      Pain: 0/10  Falls: None    TE: 45 min     Review Community Based HEP for Gym     Trial of Leg Press for set up and  performance of machine  3 x 10 reps. At 60 lbs     Calf raise  3 x 10  60 lbs      Recumbent Bike with modified set up for right foot to help maintain foot in pedal  Patient propelled x 20 min          Home Exercise Program:    - sit<>stand with UE extended, feet staggered for L disavantaged, eccentric lowering emphasized   - at half wall for postural support:    - hip abd with glut med focus (2  UE support)    - hip flex (1 UE support)    - hip flex into extension (straight knee, 1 UE support)  - clam shell  - hip abd (straight leg)  - modified bird dog  - Postural Exercises:   Shoulder ER with hands behind head  Horizontal Abd/Add  Shoulder Flexion: I and Y  TA - Alternating Heel slides,  TA - Alternating Hip Abd/ Add,    Supine bicycle legs          [employer] [id number]      Total Treatment Time:       TE 45 min     I attest that I have reviewed the above information.  Signed: Vira Blanco, PT, DPT  03/10/2022 6:09 AM

## 2022-03-16 NOTE — Unmapped (Signed)
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    SCI Level: C2 ASIA D SCI    COVID Mask use: Patient wore a mask for the entire therapy session. and Patient and/or caregiver opted not to wear mask for duration or majority of session per updated clinic mask policy.      Assessessment    Today's session focused on FES Bike for building strength and endurance..       Prior Objective Testing  Tests Eval (10/28/21) 11/26/21 11/29/21 Reassessment  12/13/21  Re-assessment 01/26/22   TUG 15.9 seconds, RW   23.85 sec with no UE support  21.6 sec ( no AD)  17.04 seconds no AD   5x STS 28.6 seconds  20.91 sec  15.04 sec  15.3 seconds (no AD)   WISCI II Level 6 (walker, brace on R, Min A - SBA x 1) Level 12 - (two crutches, brace on right, no assist)  Level 15 - Brace - Once Crutch , No Assistance > 10 M Level 15 - brace, one crutch, no assistance   10 MWT  TBD  0.62 m/s with walker 0.47 m/s 10 m/ 15.86 sec     0.63 m/s without the walker Average with rollator: 0.93 m/s  Average without rollator: 0.83 m/s      Problem List: decreased UE and LE strength, impaired balance, decreased ROM, impaired posture, deconditioning, impaired functional mobility, impaired ADLs and edema    Plan   TUG, 10 MWT and    Home Exercise ID: [id number]    Frequency/Duration of visits: per re-assessment 01/26/22: 2x/week for 8 weeks    Goals:  Patient/Family Goals:I want to be strong enough to walk without the walker and make walking my primary means of getting around.  (Met  in realm of walking - use walker because it is efficient)      Goals Met:    Patient will be able to properly demonstrate current HEP independently x1 in clinic to build upon functional gains in PT.  Met 11/29/21  Patient will ambulate 500 feet with RW, AFO donned on R LE, and decreased gait deviation to allow return to household/community ambulation. Met 6/68/23  Patient will ascend and descend 3 stairs with CGA assistance, with  1 or no handrails and safe technique demonstrating step through pattern in order to increase independence with household/community mobility.Met 11/29/21  Patient will be able to perform floor transfer with bilateral UE support and supervision assist.  Met 11/29/21  Patient will be able to perform floor transfer with no external support and SBA.   -  Met  11/26/21  Patient will complete .  Met 11/29/21  Patient will use ambulation with LRAD as primary means of mobility more than 60% of the time.  Met 01/12/22  -   Patient will  be able to ambulate 1,030ft with minimal gait deviations and AFO donned on R LE to return to community ambulation and demonstrate improved ambulation endurance. Met  01/19/22  Patient will ascend and descend 6 stairs with SBA assistance, with one  handrails and safe technique demonstrating step through pattern in order to increase independence with household/community mobility.  Met 01/19/22 - needs rail  Patient will stop using transport chair for medical appointments and ambulate into clinic with LRAD for 2 consecutive weeks to demonstrate increased participation as a community ambulator.  Met  02/11/22  Walking into apts with [employer]ater     Long Term Goals:  Patient will improve Timed Up and Go score to < 12 seconds to further reduce fall risk. Not Met 01/26/22  Patient will ambulate >1.0 meters/second to decrease fall risk and improve household/community ambulation Not Met 01/26/22  New goal 01/26/22: Patient will ambulate at least 401 meters during with LRAD and AFO donned on RLE to demonstrate increased sustained gait speed to match that of the mean for community ambulators with SCI.  New goal 01/26/22:  Patient will ambulate for all mobility with LRAD and minimal gait deviations with AFO donned for 2 consecutive weeks to demonstrate return to full participation in community as community ambulator.  Patient will improve TUG score to < 10 seconds to further reduce falls risk.         ubjective:   Pt states: Pt reports that he is doing well. He has been as active as possible, going out to restaurants with no Rollater. Pt reports ankle is improved     Reason for Referral/History of Present Condition/Onset of injury/exacerbation:   41 y.o. male presents with PMH of C2 ASIA D SCI from 51ft fall through attic (04/11/2021), Patient has a PMH of seizures, ankylosing spondylitis, TBI and crohns disease. Per MD note, patient was admitted to [facility] from 10/18-10/24/2022, readmitted to [facility] on 05/03/21 for assessment of cervical wound due to drainage and concern for infection and readmitted to [facility] AIR from 06/01/21 to 06/16/21 before requiring transfer back to acute for wound debridement/washout. Patient was readmitted again to [facility] AIR from 06/25/21-07/18/21 before being transferred back to [facility] main for wound washout      Objective:      Pain: 0/10  Falls: None    Self Care: 10  min   SCI Education Completed on:    Spasticity and stretching     Use of  RAM  for spasiciity reduction .  Skin inspection,  Pressure relief,  Temperature control ,  Sexual function     Patient reports ind with all things except putting bandgage on back .      There Ex: 35 min       Muscle strength testing     Right Hip Flexor 3-/5  Right Quad 5/5  Right Ham 4+/5  Right ankle DF 3-/5     Set pt up with FES Bike for  right LE muscular strengthening and muscle endurance, decreased tone/spasticity and increased circulation.        Treatment Rendered:    Set pt up with FES system for cardiovascular load, LE muscular strengthening and endurance, decreased tone/spasticity and increased circulation. Patient completed 30  mins of LE FES cycling on the RT300 for 6.28 miles with an average 41.3 Watts.      Warm Up time: 3 min  Average Resistance: 6.89 Nm  Average Asymmetry: L 33%  Set Speed: 47 rpm  Motor Supported Time: 0.01 min  Time without Motor: 30:08         Muscles stimulated:   LEAD 1: Ant Tip Right @ 44 mA,   LEAD 3: Quad Right  @ 40mA,   LEAD 4: Hamstring Right  @ 42mA,        Good muscle contractions were observed in all muscle groups.  No adverse skin issues were noted under electrode placement.  For the full report, please refer to [email or web address]     Pt ID: [id number]  Pin: [phone]        Home Exercise Program:    - sit<>stand with UE extended, feet staggered for L disavantaged, eccentric lowering emphasized   - at half wall for postural support:    - hip abd with glut med focus (2 UE support)    - hip flex (1 UE support)    - hip flex into extension (straight knee, 1 UE support)  - clam shell  - hip abd (straight leg)  - modified bird dog  - Postural Exercises:   Shoulder ER with hands behind head  Horizontal Abd/Add  Shoulder Flexion: I and Y  TA - Alternating Heel slides,  TA - Alternating Hip Abd/ Add,    Supine bicycle legs      [employer] [id number]      Total Treatment Time:       TE 35 min   Self Care 10 min   I attest that I have reviewed the above information.  Signed: Vira Blanco, PT, DPT  03/16/2022 5:34 AM

## 2022-03-18 NOTE — Unmapped (Signed)
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 and Patient and/or caregiver opted not to wear mask for duration or majority of session per updated clinic mask policy.      Assessessment    Today's session focused on building walking endurance while ambulating without an AD.  Patient was able to walk > 600 feet in 6 minutes which is improvement from previous and patient was able to increased gait speed to > 1.1 m /s with RW to allow for safe crossing of the street.  Working to prepare patient for discharge  next week. Patient will benefit from one more session of FES Bike.  Next Session: FES Bike one more time .      Prior Objective Testing  Tests Eval (10/28/21) 11/26/21 11/29/21 Reassessment  12/13/21  Re-assessment 01/26/22 03/18/22   TUG 15.9 seconds, RW   23.85 sec with no UE support  21.6 sec ( no AD)  17.04 seconds no AD 16.0 sec no AD    5x STS 28.6 seconds  20.91 sec  15.04 sec  15.3 seconds (no AD)    WISCI II Level 6 (walker, brace on R, Min A - SBA x 1) Level 12 - (two crutches, brace on right, no assist)  Level 15 - Brace - Once Crutch , No Assistance > 10 M Level 15 - brace, one crutch, no assistance    10 MWT  TBD  0.62 m/s with walker 0.47 m/s 10 m/ 15.86 sec     0.63 m/s without the walker Average with rollator: 0.93 m/s  Average without rollator: 0.83 m/s Average with rollater 1.11 m/s    Average without Rollater 0.80 m/s        Problem List: decreased UE and LE strength, impaired balance, decreased ROM, impaired posture, deconditioning, impaired functional mobility, impaired ADLs and edema    Plan       Home Exercise ID: [id number]    Frequency/Duration of visits: per re-assessment 01/26/22: 2x/week for 8 weeks    Goals:  Patient/Family Goals:I want to be strong enough to walk without the walker and make walking my primary means of getting around.  (Met  in realm of walking - use walker because it is efficient)      Goals Met:    Patient will be able to properly demonstrate current HEP independently x1 in clinic to build upon functional gains in PT.  Met 11/29/21  Patient will ambulate 500 feet with RW, AFO donned on R LE, and decreased gait deviation to allow return to household/community ambulation. Met 6/68/23  Patient will ascend and descend 3 stairs with CGA assistance, with  1 or no handrails and safe technique demonstrating step through pattern in order to increase independence with household/community mobility.Met 11/29/21  Patient will be able to perform floor transfer with bilateral UE support and supervision assist.  Met 11/29/21  Patient will be able to perform floor transfer with no external support and SBA.   -  Met  11/26/21  Patient will complete .  Met 11/29/21  Patient will use ambulation with LRAD as primary means of mobility more than 60% of the time.  Met 01/12/22  -   Patient will be able to ambulate 1,021ft with minimal gait deviations and AFO donned on R LE to return to community ambulation and demonstrate improved ambulation endurance. Met  01/19/22  Patient will ascend and descend 6 stairs with SBA assistance, with one  handrails and safe technique demonstrating step through pattern in order to increase independence with household/community mobility.  Met 01/19/22 - needs rail  Patient will stop using transport chair for medical appointments and ambulate into clinic with LRAD for 2 consecutive weeks to demonstrate increased participation as a community ambulator.  Met  02/11/22  Walking into apts with Rollater     Long Term Goals:  Patient will improve Timed Up and Go score to < 12 seconds to further reduce fall risk. Not Met 01/26/22  Patient will ambulate >1.0 meters/second to decrease fall risk and improve household/community ambulation  Met 03/18/22  New goal 01/26/22: Patient will ambulate at least 401 meters during with LRAD and AFO donned on RLE to demonstrate increased sustained gait speed to match that of the mean for community ambulators with SCI.  Met 03/18/22  New goal 01/26/22:  Patient will ambulate for all mobility with LRAD and minimal gait deviations with AFO donned for 2 consecutive weeks to demonstrate return to full participation in community as community ambulator. Met 03/18/22  Patient will improve TUG score to < 10 seconds to further reduce falls risk.   - Progressing        ubjective:   Pt states: Pt reports that he is doing well. He has been as active as possible, going out to restaurants with no Rollater. Pt reports ankle is improved     Reason for Referral/History of Present Condition/Onset of injury/exacerbation:   41 y.o. 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 Per MD note, patient was admitted to [facility] from 10/18-10/24/2022, readmitted to [facility] on 05/03/21 for assessment of cervical wound due to drainage and concern for infection and readmitted to [facility] from 06/01/21 to 06/16/21 before requiring transfer back to acute for wound debridement/washout. Patient was readmitted again to [facility] from 06/25/21-07/18/21 before being transferred back to [facility] main for wound washout      Objective:      Pain: 0/10  Falls: None      TUG with no No AD=  16.0 sec    10 MWT  Average with rollater  1.11 m/s   Average without Rollater 0.80 m/s      with no AD   611 feet  in 6 min    Recovery time 1 min and 15 sec     Time (min) Sp02 HR Blood Pressure RPE Comment   Rest        1        2        3        4         5  6    7 611 feet with no AD     RPE 7   Recovery 1 min        Recovery 2 min             Muscle strength testing     Right Hip Flexor 3-/5  Right Quad 5/5  Right Ham 4+/5  Right ankle DF 3-/5     There Ex:      Prioritize the following exercise with no AFO     1 x 10 reps.     Ankle DF   Heel Raise  Standing march   Ham Curl in standing          Pt ID: [id number]  Pin: [id number]      [employer] [id number]      Total Treatment Time:    PPTM: 35 min   TE 10 min   I attest that I have reviewed the above information.  Signed: Vira Blanco, PT, DPT  03/18/2022 10:26 AM

## 2022-03-22 NOTE — Unmapped (Signed)
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    SCI Level: C2 ASIA D SCI    COVID Mask use: Patient wore a mask for the entire therapy session. and Patient and/or caregiver opted not to wear mask for duration or majority of session per updated clinic mask policy.      Assessessment    Today's session focused on FES Bike for building strength and endurance.  Discussed transition to [facility] care after completion of care..       Prior Objective Testing  Tests Eval (10/28/21) 11/26/21 11/29/21 Reassessment  12/13/21  Re-assessment 01/26/22   TUG 15.9 seconds, RW   23.85 sec with no UE support  21.6 sec ( no AD)  17.04 seconds no AD   5x STS 28.6 seconds  20.91 sec  15.04 sec  15.3 seconds (no AD)   WISCI II Level 6 (walker, brace on R, Min A - SBA x 1) Level 12 - (two crutches, brace on right, no assist)  Level 15 - Brace - Once Crutch , No Assistance > 10 M Level 15 - brace, one crutch, no assistance   10 MWT  TBD  0.62 m/s with walker 0.47 m/s 10 m/ 15.86 sec     0.63 m/s without the walker Average with rollator: 0.93 m/s  Average without rollator: 0.83 m/s      Problem List: decreased UE and LE strength, impaired balance, decreased ROM, impaired posture, deconditioning, impaired functional mobility, impaired ADLs and edema    Plan    Pain 0/10     No falls       Home Exercise ID: [id number]    Frequency/Duration of visits: per re-assessment 01/26/22: 2x/week for 8 weeks    Goals:  Patient/Family Goals:I want to be strong enough to walk without the walker and make walking my primary means of getting around.  (Met  in realm of walking - use walker because it is efficient)      Goals Met:    Patient will be able to properly demonstrate current HEP independently x1 in clinic to build upon functional gains in PT.  Met 11/29/21  Patient will ambulate 500 feet with RW, AFO donned on R LE, and decreased gait deviation to allow return to household/community ambulation. Met 6/68/23  Patient will ascend and descend 3 stairs with CGA assistance, with  1 or no handrails and safe technique demonstrating step through pattern in order to increase independence with household/community mobility.Met 11/29/21  Patient will be able to perform floor transfer with bilateral UE support and supervision assist.  Met 11/29/21  Patient will be able to perform floor transfer with no external support and SBA.   -  Met  11/26/21  Patient will complete .  Met 11/29/21  Patient will use ambulation with LRAD as primary means of  mobility more than 60% of the time.  Met 01/12/22  -   Patient will be able to ambulate 1,09ft with minimal gait deviations and AFO donned on R LE to return to community ambulation and demonstrate improved ambulation endurance. Met  01/19/22  Patient will ascend and descend 6 stairs with SBA assistance, with one  handrails and safe technique demonstrating step through pattern in order to increase independence with household/community mobility.  Met 01/19/22 - needs rail  Patient will stop using transport chair for medical appointments and ambulate into clinic with LRAD for 2 consecutive weeks to demonstrate increased participation as a community ambulator.  Met  02/11/22  Walking into apts with Rollater     Long Term Goals:  Patient will improve Timed Up and Go score to < 12 seconds to further reduce fall risk. Not Met 01/26/22  Patient will ambulate >1.0 meters/second to decrease fall risk and improve household/community ambulation Not Met 01/26/22  New goal 01/26/22: Patient will ambulate at least 401 meters during with LRAD and AFO donned on RLE to demonstrate increased sustained gait speed to match that of the mean for community ambulators with SCI.  New goal 01/26/22:  Patient will ambulate for all mobility with LRAD and minimal gait deviations with AFO donned for 2 consecutive weeks to demonstrate return to full participation in community as community ambulator.  Patient will improve TUG score to < 10 seconds to further reduce falls risk.         ubjective:   Pt states: Pt reports that he is doing well. He has been as active as possible, going out to restaurants with no Rollater. Pt reports ankle is improved     Reason for Referral/History of Present Condition/Onset of injury/exacerbation:   41 y.o. male presents with PMH of C2 ASIA D SCI from 57ft fall through attic (04/11/2021), Patient has a PMH of seizures, ankylosing spondylitis, TBI and crohns disease. Per MD note, patient was admitted to [facility] from 10/18-10/24/2022, readmitted to [facility] on 05/03/21 for assessment of cervical wound due to drainage and concern for infection and readmitted to [facility] from 06/01/21 to 06/16/21 before requiring transfer back to acute for wound debridement/washout. Patient was readmitted again to [facility] from 06/25/21-07/18/21 before being transferred back to [facility] main for wound washout      Objective:      Pain: 0/10  Falls: None    NMR:  45 min          Set pt up with FES Bike for  right LE muscular strengthening and muscle endurance, decreased tone/spasticity and increased circulation.        Treatment Rendered:    Set pt up with FES system for cardiovascular load, LE muscular strengthening and endurance, decreased tone/spasticity and increased circulation. Patient completed 32  mins of LE FES cycling on the RT300 for 6.58 miles with an average 40.1 Watts. Warm Up time: 3 min  Average Resistance: 7.12 Nm  Average Asymmetry: L 22%  Set Speed: 56 rpm  Motor Supported Time: 0.27 min  Time without Motor: 32:16         Muscles stimulated:   LEAD 1: Ant Tip Right @ 44 mA,   LEAD 3: Quad Right  @ 40mA,   LEAD 4: Hamstring Right  @ 42mA,      Patient continues to benefit from  FES bike, and may benefit from Therapeutic biking at [facility] following discharge from care  Good muscle contractions  were observed in all muscle groups.  No adverse skin issues were noted under electrode placement.  For the full report, please refer to [email or web address]     Pt ID: [id number]  Pin: [id number]        Home Exercise Program:    - sit<>stand with UE extended, feet staggered for L disavantaged, eccentric lowering emphasized   - at half wall for postural support:    - hip abd with glut med focus (2 UE support)    - hip flex (1 UE support)    - hip flex into extension (straight knee, 1 UE support)  - clam shell  - hip abd (straight leg)  - modified bird dog  - Postural Exercises:   Shoulder ER with hands behind head  Horizontal Abd/Add  Shoulder Flexion: I and Y  TA - Alternating Heel slides,  TA - Alternating Hip Abd/ Add,    Supine bicycle legs      [employer] [id number]      Total Treatment Time:     NMR: 45 min   I attest that I have reviewed the above information.  Signed: Vira Blanco, PT, DPT  03/22/2022 10:32 AM

## 2022-03-24 NOTE — Unmapped (Incomplete)
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    SCI Level: C2 ASIA D SCI    COVID Mask use: Patient wore a mask for the entire therapy session. and Patient and/or caregiver opted not to wear mask for duration or majority of session per updated clinic mask policy.      Assessessment    Today's session focused on FES Bike for building strength and endurance.  Discussed transition to Next Step for care after completion of care..       Prior Objective Testing  Tests Eval (10/28/21) 11/26/21 11/29/21 Reassessment  12/13/21  Re-assessment 01/26/22 Discharge  03/24/22   TUG 15.9 seconds, RW   23.85 sec with no UE support  21.6 sec ( no AD)  17.04 seconds no AD 11.1   5x STS 28.6 seconds  20.91 sec  15.04 sec  15.3 seconds (no AD) 13.1 sec   WISCI II Level 6 (walker, brace on R, Min A - SBA x 1) Level 12 - (two crutches, brace on right, no assist)  Level 15 - Brace - Once Crutch , No Assistance > 10 M Level 15 - brace, one crutch, no assistance Level 15    10 MWT  TBD  0.62 m/s with walker 0.47 m/s 10 m/ 15.86 sec     0.63 m/s without the walker Average with rollator: 0.93 m/s  Average without rollator: 0.83 m/s   1.16 m/s    0.806 m/s      Problem List: decreased UE and LE strength, impaired balance, decreased ROM, impaired posture, deconditioning, impaired functional mobility, impaired ADLs and edema    Plan    Pain 0/10 walking my primary means of getting around.  (Met  in realm of walking - use walker because it is efficient)      Goals Met:    Patient will be able to properly demonstrate current HEP independently x1 in clinic to build upon functional gains in PT.  Met 11/29/21  Patient will ambulate 500 feet with RW, AFO donned on R LE, and decreased gait deviation to allow return to household/community ambulation. Met 6/68/23  Patient will ascend and descend 3 stairs with CGA assistance, with  1 or no handrails and safe technique demonstrating step through pattern in order to increase independence with household/community mobility.Met 11/29/21  Patient will be able to perform floor transfer with bilateral UE support and supervision assist.  Met 11/29/21  Patient will be able to perform floor transfer with no external support and SBA.   -  Met  11/26/21  Patient will complete .  Met 11/29/21  Patient will use ambulation with LRAD as primary means of mobility more than 60% of the time.  Met 01/12/22  -   Patient will be able to ambulate 1,039ft with minimal gait deviations and AFO donned on R LE  to return to community ambulation and demonstrate improved ambulation endurance. Met  01/19/22  Patient will ascend and descend 6 stairs with SBA assistance, with one  handrails and safe technique demonstrating step through pattern in order to increase independence with household/community mobility.  Met 01/19/22 -  Patient will stop using transport chair for medical appointments and ambulate into clinic with LRAD for 2 consecutive weeks to demonstrate increased participation as a community ambulator.  Met  02/11/22     Long Term Goals:  Patient will improve Timed Up and Go score to < 12 seconds to further reduce fall risk. Met 03/24/22    Patient will ambulate >1.0 meters/second to decrease fall risk and improve household/community ambulation Met 03/24/22    New goal 01/26/22: Patient will ambulate at least 401 meters during with LRAD and AFO donned on RLE to demonstrate increased sustained gait speed to match that of the mean for community ambulators with SCI.  Met 03/24/22  New goal 01/26/22:  Patient will ambulate for all mobility with LRAD and minimal gait deviations with AFO donned for 2 consecutive weeks to demonstrate return to full participation in community as community ambulator. Met 03/24/22  Patient will improve TUG score to < 10 seconds to further reduce falls risk Met 03/24/22        Subjective:   Pt states: I am in better shape now than I  was even before the injrury.       Reason for Referral/History of Present Condition/Onset of injury/exacerbation:   41 y.o. male presents with PMH of C2 ASIA D SCI from 49ft fall through attic (04/11/2021), Patient has a PMH of seizures, ankylosing spondylitis, TBI and crohns disease. Per MD note, patient was admitted to [facility] AIR from 10/18-10/24/2022, readmitted to [facility] on 05/03/21 for assessment of cervical wound due to drainage and concern for infection and readmitted to [facility] AIR from 06/01/21 to 06/16/21 before requiring transfer back to acute for wound debridement/washout. Patient was readmitted again to [facility] AIR from 06/25/21-07/18/21 before being transferred back to [facility] main for wound washout      Objective:      Pain: 0/10  Falls: None    PPTM:  45 min     See Chart Above     TUG 11.1 sec   5 STS 13.1 sec     WISCI II Level 15     10 MWT     1.16 m/s with rollater    0.806 m/s with no AD        Pt ID: [id number]  Pin: [phone]        Home Exercise Program:    - sit<>stand with UE extended, feet staggered for L disavantaged, eccentric lowering emphasized   - at half wall for postural support:    - hip abd with glut med focus (2 UE support)    - hip flex (1 UE support)    - hip flex into extension (straight knee, 1 UE support)  - clam shell  - hip abd (straight leg)  - modified bird dog  - Postural Exercises:   Shoulder ER with hands behind head  Horizontal Abd/Add  Shoulder Flexion: I and Y  TA - Alternating Heel slides,  TA - Alternating Hip Abd/ Add,    Supine bicycle legs      [employer] [id number]      Total Treatment Time:     NMR: 45 min   I attest that I have reviewed the  above information.  Signed: Vira Blanco, PT, DPT  03/24/2022 9:26 AM willingness:***  Independent w/ ADLs?: ***    Pain: ***    Patient???s communication preference: Verbal, Written, and Visual    Barriers to Learning: {Rehab Barriers to [phone]}    Recent Procedures/Tests/Findings  ***    Subjective Outcome Measures:  - crcabcscale (Activities-specific Balance Confidence (ABC) Scale)  - CEMsubjFALLSEFFICACYSCALE (falls efficacy scale)    Past Medical History:   Diagnosis Date    Anemia     Crohn's colitis (CMS-HCC)     Spondylolysis     Ankylosing spondylitis     Family History   Problem Relation Age of Onset    Autoimmune disease Maternal Grandmother         Psoriasis    Polycystic kidney disease Sister     Anesthesia problems Neg Hx     Ulcerative colitis Neg Hx     Crohn's disease Neg Hx     Colorectal Cancer Neg Hx     Pancreatic cancer Neg Hx     Esophageal cancer Neg Hx        Past Surgical History:   Procedure Laterality Date    HERNIA REPAIR      NECK SURGERY  2016    spinal fusion.has screw in his spine    PR COLONOSCOPY W/BIOPSY SINGLE/MULTIPLE  09/15/2014    Procedure: COLONOSCOPY, FLEXIBLE, PROXIMAL TO SPLENIC FLEXURE; WITH BIOPSY, SINGLE OR MULTIPLE;  Surgeon: Zetta Bills, MD;  [facility];  Service: Gastroenterology    PR COLONOSCOPY W/BIOPSY SINGLE/MULTIPLE N/A 10/06/2016    Procedure: COLONOSCOPY, FLEXIBLE, PROXIMAL TO SPLENIC FLEXURE; WITH BIOPSY, SINGLE OR MULTIPLE;  Surgeon: Zetta Bills, MD;  Location: [facility];  Service: Gastroenterology    PR COLONOSCOPY W/BIOPSY SINGLE/MULTIPLE N/A 01/04/2021    Procedure: COLONOSCOPY, FLEXIBLE, PROXIMAL TO SPLENIC FLEXURE; WITH BIOPSY, SINGLE OR MULTIPLE;  Surgeon: Zetta Bills, MD;  Location: [facility];  Service: Gastroenterology    PR COMPLEX DRAINAGE, WOUND Midline 05/20/2021    Procedure: INCISION & DRAINAGE, COMPLEX, POSTOPERATIVE WOUND INFECTION BACK/PRONE;  Surgeon: Seth Bake, MD;  Location: MAIN OR [facility];  Service: Neurosurgery    PR DEBRIDEMENT, SKIN, SUB-Q TISSUE,=<20 SQ CM Midline 07/19/2021    Procedure: DEBRIDEMENT; SKIN & SUBCUTANEOUS TISSUE TRUNK;  Surgeon: Clarene Duke, MD;  Location: MAIN OR [place];  Service: Plastics    PR DEBRIDEMENT, SKIN, SUB-Q TISSUE,MUSCLE,=<20 SQ CM Midline 06/18/2021    Procedure: DEBRIDEMENT; SKIN, SUBCUTANEOUS TISSUE, & MUSCLE;  Surgeon: Clarene Duke, MD;  Location: MAIN OR [place];  Service: Plastics    PR IONM 1 ON 1 IN OR W/ATTENDANCE EACH 15 MINUTES N/A 04/25/2014    Procedure: CONTINUOUS INTRAOPERATIVE NEUROPHYSIOLOGY MONITORING IN OR;  Surgeon: Everlean Alstrom, MD;  Location: MAIN OR [place];  Service: Neurosurgery    PR MUSCLE-SKIN FLAP,TRUNK Midline 05/24/2021    Procedure: MUSCLE, MYOCUTANEOUS, OR FASCIOCUTANEOUS FLAP; TRUNK;  Surgeon: Clarene Duke, MD;  Location: MAIN OR [place];  Service: Plastics    PR ODONTOID,FX,GRAFTING,OPEN Midline 04/25/2014    Procedure: OPEN TREATMENT &/OR REDCUCTION ODONTOID FX/DISLOCATION, ANTERIOR APPROACH, INTERNAL FIXATION, WITH GRAFT;  Surgeon: Everlean Alstrom, MD;  Location: MAIN OR [place];  Service: Neurosurgery    PR SURG DIAGNOSTIC EXAM, ANORECTAL N/A 01/21/2021    Procedure: ANORECTAL EXAM, SURGICAL, REQUIRING ANESTHESIA (GENERAL, SPINAL, OR EPIDURAL), DIAGNOSTIC;  Surgeon: Sharyn Lull, MD;  Location: MAIN OR [place];  Service: Gastrointestinal    PR SURG EXCISION OF ANAL LESION(S)  N/A 01/21/2021    Procedure: DESTRUCTION OF LESION(S), ANUS (EG, CONDYLOMA, PAPILLOMA, MOLLUSCUM CONTAGIOSUM) SIMPLE; SURGICAL EXCISION;  Surgeon: Sharyn Lull, MD;  Location: MAIN OR [place];  Service: Gastrointestinal    TONSILLECTOMY        Allergies   Allergen Reactions    Infliximab      Seizure activity  Other reaction(s): Other (See Comments)  Seizures        Social History     Tobacco Use    Smoking status: Never    Smokeless tobacco: Never   Substance Use Topics    Alcohol use: Not Currently     Comment: rare      Current Outpatient Medications   Medication Sig Dispense Refill    ascorbic acid, vitamin C, (VITAMIN C) 500 MG tablet Take 1 tablet (500 mg total) by mouth daily.      azaTHIOprine (IMURAN) 50 mg tablet Take 3 tablets (150 mg total) by mouth daily. 270 tablet 0    busPIRone (BUSPAR) 15 MG tablet TAKE 1 TABLET (15 MG TOTAL) BY MOUTH THREE (3) TIMES A DAY. 270 tablet 3    ferrous sulfate 325 (65 FE) MG tablet Take 1 tablet (325 mg total) by mouth in the morning.      HUMIRA PEN CITRATE FREE 40 MG/0.4 ML Inject the contents of 1 pen (40 mg total) under the skin every seven (7) days. 4 each 5    HUMIRA PEN CITRATE FREE STARTER PACK FOR CROHN'S/UC/HS 3 X 80 MG/0.8 ML Inject the contents of 2 pens (160 mg total) day 1, then inject the contents of 1 pen (80 mg total) on day 15 for loading dose. 3 each 0    levETIRAcetam (KEPPRA) 1000 MG tablet Take 1 tablet (1,000 mg total) by mouth every twelve (12) hours. 180 tablet 3    methocarbamoL (ROBAXIN) 500 MG tablet TAKE 1 TABLET (500 MG TOTAL) BY MOUTH TWO (2) TIMES A DAY AS NEEDED FOR PAIN. 90 tablet 1    midazolam 5 mg/spray (0.1 mL) Spry Give 1 spray (5mg ) into 1 nostril. If no response in 10 min, may give second spray in other nostril. Do not give second dose if patient has trouble breathing or excessive sedation. Max 2 doses/seizure episode, 1 episode every 3 days or 5 episodes/month. 2 each 4    oxybutynin (DITROPAN) 5 MG tablet Take 1 tablet (5 mg total) by mouth Three (3) times a day. (Patient taking differently: Take 1 tablet (5 mg total) by mouth in the morning.) 270 tablet 3    tamsulosin (FLOMAX) 0.4 mg capsule Take 1 capsule (0.4 mg total) by mouth daily. (Patient taking differently: Take 1 capsule (0.4 mg total) by mouth daily. Evening) 90 capsule 3     No current facility-administered medications for this visit.          Objective     General Observations    Posture:    Sitting: {Sitting Posture/[phone]}   Standing: {Standing Posture/Observation:[phone]}  Skin assessment/Edema: {Edema Skin:[phone]}    Vitals   Resting Blood Pressure: ***   Heart Rate: ***  Sensory    Sensation: ***  Proprioception: ***  Musculoskeletal    Range of Motion/Flexibilty/MMT/Tone:    UE: ROM: *** MMT: ***    LE: ROM: *** MMT: ***    Modified Ashworth Scale of Muscle Spasticity - Key: Modified Ashworth (supine)   0: No increased tone   1: Slightly increased tone, manifested by a catch and release at end  ROM   1+: Slightly increased tone, a catch followed by minimal resistance through remainder (1/2) of the ROM   2: More marked increase in tone, affected part easily moved   3: Considerable increase in tone, passive movement difficult   4: Affected part rigid in flexion or extension       Motor Function/Coordination: ***       Functional Mobility    Transfers:    Bed Mobility: ***   Sit<>Stand:***   Low Pivot:***   Stand Pivot:***    Balance:***  Static  - CEMOBJMCTSIB (mCTSIB)  - CRCBERGBALANCE (Berg)  - CEMOBJFIST (FIST)  - FUNCTIONALREACHTEST (Functional Reach)    Dynamic  - CRCMINIBESTEST (MiniBest)  - CRCDYNAMICGAITINDEX (DGI)  - CRCFUNCTIONALGAITASSESSMENT (FGA)    Gait Analysis: ***   - cemobjgaitspeed (Gait Speed)  - TUG   - cemobj76mwt ( )    Functional Tests: ***  5xSTS    Treatment Rendered:    PT Evaluation:*** min   {TX Rendered:[phone]}    I attest that I have reviewed the above information.  Signed: Arlyce Dice, PT, DPT  03/24/2022 11:29 AM          ubjective:   Pt states: Pt reports that he is in better shape now than he was even before the injrury.       Reason for Referral/History of Present Condition/Onset of injury/exacerbation:   41 y.o. male presents with PMH of C2 ASIA D SCI from 66ft fall through attic (04/11/2021), Patient has a PMH of seizures, ankylosing spondylitis, TBI and crohns disease. Per MD note, patient was admitted to [facility] AIR from 10/18-10/24/2022, readmitted to [facility] on 05/03/21 for assessment of cervical wound due to drainage and concern for infection and readmitted to [facility] AIR from 06/01/21 to 06/16/21 before requiring transfer back to acute for wound debridement/washout. Patient was readmitted again to [facility] AIR from 06/25/21-07/18/21 before being transferred back to [facility] main for wound washout      Objective:      Pain: 0/10  Falls: None      TUG:     5 STS:      Pt ID: [id number]  Pin: [phone]        Home Exercise Program:    - sit<>stand with UE extended, feet staggered for L disavantaged, eccentric lowering emphasized   - at half wall for postural support:    - hip abd with glut med focus (2 UE support)    - hip flex (1 UE support)    - hip flex into extension (straight knee, 1 UE support)  - clam shell  - hip abd (straight leg)  - modified bird dog  - Postural Exercises:   Shoulder ER with hands behind head  Horizontal Abd/Add  Shoulder Flexion: I and Y  TA - Alternating Heel slides,  TA - Alternating Hip Abd/ Add,    Supine bicycle legs      [employer] [id number]      Total Treatment Time:     NMR: 45 min   I attest that I have reviewed the above information.  Signed: Vira Blanco, PT, DPT  03/24/2022 9:26 AM

## 2022-04-01 NOTE — Unmapped (Incomplete)
APPOINTMENTS / QUESTIONS  For NON-urgent questions regarding appointments or your surgical care, you may reach out to your provider and/or their nursing staff via MyChart. Please keep in mind that it may take up to 2-3 business days for a response.    Additionally, you may contact our clinics, Monday through Friday, 8 am to 4:30 pm    [facility]  [address],   [place] [address]  T: [phone]  F: [phone]    [facility]  [address] [place], [place] [address]  Ground Floor [facility]  T: [phone]  F: [phone]    [facility]  [address], [place], [place] [address]  T: [phone]  F: [phone]    [facility] Plastic Surgery at [facility]  [address] [place], [place] [address]  Third Floor, [address]  T: [phone]   F: [phone]    Ambulatory Surgery Center  1st Floor, Ambulatory Care Center  [address]  [place], [place] [address]  T: [phone]  Website:  [facility]    Administrative Office  [address]  [place], [place] [address]  T:  [phone]  F:  [phone]      Physicians:  Epimenio Sarin MD:   Nurse:  Vista Mink BSN, RN [phone]    Surgery Scheduler: Lysle Morales  [phone]     Tarry Kos MD:   Nurse:  Elisabeth Pigeon BSN, RN, CPSN [phone]  Surgery Scheduler:   Lysle Morales  [phone]     Calvert Cantor MD   Nurse: Hendricks Milo BSN, RN [phone]   Surgery Scheduler: Lysle Morales  [phone]     Adeyemi Clydie Braun MD:  Nurse: Jasmine December BSN, RN, [place]  [phone]  Surgery Scheduler: Kristine Royal [phone]    Darleene Cleaver MD:  Nurse: Venetia Maxon BSN, RN, CPSN  [phone]  Surgery Scheduler: Kristine Royal [phone]    Advance Practice Providers:  Ezekiel Slocumb, DNP, NP-C:    Nurse:  Hendricks Milo BSN, RN [phone]  Surgery Scheduler: Kristine Royal [phone]     Rush Landmark, PA-C:    Nurse:  Hendricks Milo BSN, RN [phone]  Surgery Scheduler: Kristine Royal [phone]    Financial Navigator:  Arlee Muslim  Hours:  7:30 am to 4 pm  P: [phone] - F: [phone]           Billing Questions/Financial Navigation:  [phone]  [facility] Customer Service Call Center:  [phone]    Surgery Scheduling:  You will receive a phone call from the surgery schedulers regarding date for surgery in 1-2 weeks from  your appointment.    Please call one of the above numbers if you have not received a phone call 2 weeks after your appointment with the provider.      [facility] Contact Information:   Please call to schedule  *Radiology (CT, X-ray, Ultrasound) [phone]  *Mammography & Breast Ultrasound [phone]  *MRI [phone]  *Interventional Radiology Procedure Scheduling [phone]    AFTER HOURS/HOLIDAYS:  For emergencies after-hours (after 5pm, or weekends): call the [facility] operator [phone]) and ask to page the Plastic Surgery resident on call. You will be directed to a surgery resident who likely is not immediately aware of the details of your case, but can help you deal with any emergencies that cannot wait until regular business hours.  Please be aware that this person  is responding to many in-hospital emergencies and patient issues and may not answer your phone call immediately, but will return your call as soon as possible.    Precare Location:  [employer] Pre-Procedure Services at [facility]   [address]  [place], [place] [address] ([address] - old [address] near [employer])    Blood Work Location:  [address] [place] [address]. There are no suite numbers, but it is located on the first floor there. Go to the main desk and tell them you are there for walk-in labs.  Precare:   The day prior to your scheduled surgery, Pre-Care will call you with instructions.  If you have not heard from them by 4PM, and would like to check on the status of your surgery, please call:  [facility]: [phone]  [facility]: [phone]  [facility]: [phone]  [facility]:  [phone]    Insurance Denials:  All appeal initiation needs to be started by the patient.  We do not appeal insurance denials.     [facility] Hotline:  [phone]    [facility] forms and paperwork:  Please allow  a 2 week turn around for forms to be completed and faxed.

## 2022-04-03 MED ORDER — METHOCARBAMOL 500 MG TABLET
ORAL_TABLET | Freq: Two times a day (BID) | ORAL | 1 refills | 45 days | PRN
Start: 2022-04-03 — End: 2023-04-03

## 2022-04-04 MED ORDER — METHOCARBAMOL 500 MG TABLET
ORAL_TABLET | Freq: Two times a day (BID) | ORAL | 1 refills | 45 days | Status: CP | PRN
Start: 2022-04-04 — End: 2023-04-04
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
## 2022-04-04 NOTE — Unmapped (Signed)
Patient is requesting the following refill  Requested Prescriptions     Pending Prescriptions Disp Refills    methocarbamoL (ROBAXIN) 500 MG tablet 90 tablet 1     Sig: Take 1 tablet (500 mg total) by mouth two (2) times a day as needed (Pain).       Recent Visits  Date Type Provider Dept   08/23/21 Office Visit Claudean Kinds, MD [facility]   Showing recent visits within past 365 days with a meds authorizing provider and meeting all other requirements  Future Appointments  Date Type Provider Dept   08/25/22 Appointment Criselda Peaches, Lidia Collum, MD [facility]   Showing future appointments within next 365 days with a meds authorizing provider and meeting all other requirements       Labs: Not applicable this refill

## 2022-04-05 ENCOUNTER — Ambulatory Visit: Admit: 2022-04-05 | Discharge: 2022-04-06 | Payer: PRIVATE HEALTH INSURANCE

## 2022-04-05 DIAGNOSIS — Z79899 Other long term (current) drug therapy: Principal | ICD-10-CM

## 2022-04-05 DIAGNOSIS — K50819 Crohn's disease of both small and large intestine with unspecified complications: Principal | ICD-10-CM

## 2022-04-05 LAB — C-REACTIVE PROTEIN: C-REACTIVE PROTEIN: 17 mg/L — ABNORMAL HIGH (ref <=10.0)

## 2022-04-05 LAB — IRON PANEL
IRON SATURATION: 23 % (ref 20–55)
IRON: 65 ug/dL
TOTAL IRON BINDING CAPACITY: 277 ug/dL (ref 250–425)

## 2022-04-05 LAB — FERRITIN: FERRITIN: 50.7 ng/mL

## 2022-04-05 NOTE — Unmapped (Signed)
[facility] Refill Coordination Note    Specialty Medication(s) to be Shipped:   Inflammatory Disorders: Humira    Other medication(s) to be shipped: No additional medications requested for fill at this time     Kerry Mooney, DOB: 1981/06/03  Phone: [phone] (home) [phone] (work)      All above HIPAA information was verified with patient.     Was a Nurse, learning disability used for this call? No    Completed refill call assessment today to schedule patient's medication shipment from the [facility] [phone]).  All relevant notes have been reviewed.     Specialty medication(s) and dose(s) confirmed: Regimen is correct and unchanged.   Changes to medications: Yaqoob reports no changes at this time.  Changes to insurance: No  New side effects reported not previously addressed with a pharmacist or physician: None reported  Questions for the pharmacist: No    Confirmed patient received a Conservation officer, historic buildings and a Surveyor, mining with first shipment. The patient will receive a drug information handout for each medication shipped and additional FDA Medication Guides as required.       DISEASE/MEDICATION-SPECIFIC INFORMATION        For patients on injectable medications: Patient currently has 1 doses left.  Next injection is scheduled for 04/09/22.    SPECIALTY MEDICATION ADHERENCE     Medication Adherence    Patient reported X missed doses in the last month: 0  Specialty Medication: HUMIRA(CF) PEN 40 mg/0.4 mL injection  Patient is on additional specialty medications: No                          Were doses missed due to medication being on hold? No    Humira 40/0.4 mg/ml: 4 days of medicine on hand        REFERRAL TO PHARMACIST     Referral to the pharmacist: Not needed      [facility]     Shipping address confirmed in Epic.     Delivery Scheduled: Yes, Expected medication delivery date: 04/13/22.     Medication will be delivered via UPS to the prescription address in Epic WAM.    Willette Pa   [facility] Specialty Technician

## 2022-04-05 NOTE — Unmapped (Addendum)
Kerry Mooney it was a pleasure seeing you today.  Here is a summary from today's visit:     1.  We will plan to get blood work today  2.  Will plan to perform a repeat colonoscopy early next year to allow the Humira to work      5.  If any trouble or symptoms, do not hesitate to call.     Zetta Bills, MD  Associate Professor of Medicine  Division of Gastroenterology & Hepatology  [place] [facility]        EXPECTATIONS FOR PATIENT FOLLOW UP AND COMMUNICATION:  -- Follow up Appointments:  Crohn's disease and Ulcerative colitis are serious chronic inflammatory diseases which require close monitoring.  I typically expect to see patients for a follow up visits at least every 6 months (or more often if you are having a flare, starting new therapy, etc).  In select cases, patients who are only on aminosalicylates / mesalamine, may be seen once per year for follow up.     -- Appointment Type: While we are continuing to offer video appointments in select cases (stable patients who are not in a flare and without new/active symptoms) during the COVID19 pandemic, I expect to see patients in person at least once per year.   -- Communication:  if you have any questions or concerns, you can communicate with [place] via phone (contact information below) or via myChart.  Note that phone messages are given higher priority and triaged first.  We typically respond to myChart messages within 3 business days (occasionally longer during holidays or vacation times).  For urgent issues, please contact us via phone.      IBD NURSE COORDINATOR CONTACT - Christy Gentles, RN     Phone: [phone] (direct line)      Fax: [phone]  * For urgent medical concerns after hours or on weekends and holidays, call [phone] and ask to speak to the GI Fellow on call.    * If you have a GI medical question or GI symptoms and would like to speak to your provider's healthcare team, please contact Neta Mends, RN (contact information above) OR you can send the GI healthcare team a message through MyChart at [email or web address]    APPOINTMENT SCHEDULING FOR GI CLINIC AND GI PROCEDURES:  RADIOLOGY - to schedule imaging ordered, please call [phone] opt 1   GI PROCEDURES         [phone] option 2   GI MEDICINE CLINIC  [phone] option 1   *To schedule, reschedule, or cancel your GI appointment, please call [phone]. If you are unable to come to an appointment, please notify us as soon as possible, preferably 24 hours in advance. Doing so may allow other patients with urgent needs to be scheduled in a cancelled appointment slot.     TEST RESULTS   If you have a MyChart account, your new results and a provider message will be sent to you through your MyChart account at [email or web address]. For results that require follow-up, a member of your healthcare team will also contact you.    PRESCRIPTION REFILL REQUESTS  To request prescription refills, please contact your pharmacy or send your healthcare team a message through your MyChart account at [email or web address]  RECORD REQUESTS  For questions related to medical records, please call Medical Records Release of Information at [phone]  FINANCIAL COUNSELOR   For billing and other financial questions/needs - please contact Gwendlyn Deutscher [phone]. If you  need to leave a message, please be sure to leave your full name, date of birth or MR#, best call back # and reason for call.    For educational material and resources:  [email or web address]  [place] COVID19 Vaccine Information: [email or web address]  ================================================================

## 2022-04-07 NOTE — Unmapped (Unsigned)
[facility] Plastic Surgery  [place] PA-C    HPI  Kerry Mooney is a 41 y.o. male who suffered a 10 ft fall 04/11/21 resulting in multiple spinal fractures & acute spinal cord injury s/p PLIF C2-4, L1-2 decompression laminectomy with reduction of fx T12-L3 PLA with pedicle screw fixation and local autographing on 04/12/21 at [facility], who then had a dehiscence of his incision. He underwent previous debridement and VAC placement by Neurosurgery on 05/20/21 followed by delayed closure by Plastics. His postoperative course was complicated by dehiscence, and he underwent debridement and VAC placement on 07/19/21. He presents for a wound check.     Interval History:       Past Medical History:   Diagnosis Date    Anemia     Crohn's colitis (CMS-HCC)     Spondylolysis     Ankylosing spondylitis       Patient Active Problem List   Diagnosis    Dens fracture (CMS-HCC)    Crohn's disease of small and large intestines with complication (CMS-HCC)    Arthritis associated with inflammatory bowel disease    Anal condyloma    Wound dehiscence, surgical    Fall    Postoperative wound infection    Pressure injury of skin    Protein-calorie malnutrition, severe (CMS-HCC)    Spondylolysis    Crohn's colitis (CMS-HCC)    Closed burst fracture of lumbar vertebra with routine healing    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)    Routine health maintenance    Seizure disorder (CMS-HCC)       Past Surgical History:   Procedure Laterality Date    HERNIA REPAIR      NECK SURGERY  2016    spinal fusion.has screw in his spine    PR COLONOSCOPY W/BIOPSY SINGLE/MULTIPLE  09/15/2014    Procedure: COLONOSCOPY, FLEXIBLE, PROXIMAL TO SPLENIC FLEXURE; WITH BIOPSY, SINGLE OR MULTIPLE;  Surgeon: Zetta Bills, MD;  [facility];  Service: Gastroenterology    PR COLONOSCOPY W/BIOPSY SINGLE/MULTIPLE N/A 10/06/2016    Procedure: COLONOSCOPY, FLEXIBLE, PROXIMAL TO SPLENIC FLEXURE; WITH BIOPSY, SINGLE OR MULTIPLE;  Surgeon: Zetta Bills, MD;  Location: [facility];  Service: Gastroenterology    PR COLONOSCOPY W/BIOPSY SINGLE/MULTIPLE N/A 01/04/2021    Procedure: COLONOSCOPY, FLEXIBLE, PROXIMAL TO SPLENIC FLEXURE; WITH BIOPSY, SINGLE OR MULTIPLE;  Surgeon: Zetta Bills, MD;  Location: [facility];  Service: Gastroenterology    PR COMPLEX DRAINAGE, WOUND Midline 05/20/2021    Procedure: INCISION & DRAINAGE, COMPLEX, POSTOPERATIVE WOUND INFECTION BACK/PRONE;  Surgeon: Seth Bake, MD;  Location: MAIN OR [facility];  Service: Neurosurgery    PR DEBRIDEMENT, SKIN, SUB-Q TISSUE,=<20 SQ CM Midline 07/19/2021    Procedure: DEBRIDEMENT; SKIN & SUBCUTANEOUS TISSUE TRUNK;  Surgeon: Clarene Duke, MD;  Location: MAIN OR [place];  Service: Plastics    PR DEBRIDEMENT, SKIN, SUB-Q TISSUE,MUSCLE,=<20 SQ CM Midline 06/18/2021    Procedure: DEBRIDEMENT; SKIN, SUBCUTANEOUS TISSUE, & MUSCLE;  Surgeon: Clarene Duke, MD;  Location: MAIN OR [place];  Service: Plastics    PR IONM 1 ON 1 IN OR W/ATTENDANCE EACH 15 MINUTES N/A 04/25/2014    Procedure: CONTINUOUS INTRAOPERATIVE NEUROPHYSIOLOGY MONITORING IN OR;  Surgeon: Everlean Alstrom, MD;  Location: MAIN OR [facility];  Service: Neurosurgery    PR MUSCLE-SKIN FLAP,TRUNK Midline 05/24/2021    Procedure: MUSCLE, MYOCUTANEOUS, OR FASCIOCUTANEOUS FLAP; TRUNK;  Surgeon: Clarene Duke, MD;  Location: MAIN OR [place];  Service: Plastics    PR ODONTOID,FX,GRAFTING,OPEN Midline 04/25/2014    Procedure: OPEN TREATMENT &/OR REDCUCTION ODONTOID FX/DISLOCATION, ANTERIOR APPROACH, INTERNAL FIXATION, WITH GRAFT;  Surgeon: Everlean Alstrom, MD;  Location: MAIN OR [place];  Service: Neurosurgery    PR SURG DIAGNOSTIC EXAM, ANORECTAL N/A 01/21/2021    Procedure: ANORECTAL EXAM, SURGICAL, REQUIRING ANESTHESIA (GENERAL, SPINAL, OR EPIDURAL), DIAGNOSTIC;  Surgeon: Sharyn Lull, MD;  Location: MAIN OR  [place];  Service: Gastrointestinal    PR SURG EXCISION OF ANAL LESION(S) N/A 01/21/2021    Procedure: DESTRUCTION OF LESION(S), ANUS (EG, CONDYLOMA, PAPILLOMA, MOLLUSCUM CONTAGIOSUM) SIMPLE; SURGICAL EXCISION;  Surgeon: Sharyn Lull, MD;  Location: MAIN OR [place];  Service: Gastrointestinal    TONSILLECTOMY           Current Outpatient Medications:     ascorbic acid, vitamin C, (VITAMIN C) 500 MG tablet, Take 1 tablet (500 mg total) by mouth daily., Disp: , Rfl:     azaTHIOprine (IMURAN) 50 mg tablet, Take 3 tablets (150 mg total) by mouth daily., Disp: 270 tablet, Rfl: 0    busPIRone (BUSPAR) 15 MG tablet, TAKE 1 TABLET (15 MG TOTAL) BY MOUTH THREE (3) TIMES A DAY., Disp: 270 tablet, Rfl: 3    ferrous sulfate 325 (65 FE) MG tablet, Take 1 tablet (325 mg total) by mouth in the morning., Disp: , Rfl:     HUMIRA PEN CITRATE FREE 40 MG/0.4 ML, Inject the contents of 1 pen (40 mg total) under the skin every seven (7) days., Disp: 4 each, Rfl: 5    HUMIRA PEN CITRATE FREE STARTER PACK FOR CROHN'S/UC/HS 3 X 80 MG/0.8 ML, Inject the contents of 2 pens (160 mg total) day 1, then inject the contents of 1 pen (80 mg total) on day 15 for loading dose., Disp: 3 each, Rfl: 0    levETIRAcetam (KEPPRA) 1000 MG tablet, Take 1 tablet (1,000 mg total) by mouth every twelve (12) hours., Disp: 180 tablet, Rfl: 3    methocarbamoL (ROBAXIN) 500 MG tablet, Take 1 tablet (500 mg total) by mouth two (2) times a day as needed (Pain)., Disp: 90 tablet, Rfl: 1    midazolam 5 mg/spray (0.1 mL) Spry, Give 1 spray (5mg ) into 1 nostril. If no response in 10 min, may give second spray in other nostril. Do not give second dose if patient has trouble breathing or excessive sedation. Max 2 doses/seizure episode, 1 episode every 3 days or 5 episodes/month., Disp: 2 each, Rfl: 4    oxybutynin (DITROPAN) 5 MG tablet, Take 1 tablet (5 mg total) by mouth Three (3) times a day. (Patient taking differently: Take 1 tablet (5 mg total) by mouth in the morning.), Disp: 270 tablet, Rfl: 3 tamsulosin (FLOMAX) 0.4 mg capsule, Take 1 capsule (0.4 mg total) by mouth daily. (Patient taking differently: Take 1 capsule (0.4 mg total) by mouth daily. Evening), Disp: 90 capsule, Rfl: 3    Allergies   Allergen Reactions    Infliximab      Seizure activity  Other reaction(s): Other (See Comments)  Seizures       Family History   Problem Relation Age of Onset    Autoimmune disease Maternal Grandmother         Psoriasis    Polycystic kidney disease Sister     Anesthesia problems Neg Hx     Ulcerative colitis Neg Hx     Crohn's disease Neg Hx     Colorectal Cancer Neg Hx     Pancreatic cancer  Neg Hx     Esophageal cancer Neg Hx        Social History  Accompanied by mother.    ROS  See HPI. All other systems reviewed are negative.    Physical Exam  There were no vitals taken for this visit.     General Appearance: No acute distress.   Pulmonary: Normal respiratory effort.   Skin:  Midline thoracic superficial wound with healthy wound bed. Decreased area of overall wound diameter to 3.5x2cm. No concern for full thickness wound or infection today.  Covered with island dressing. No TTP. Surrounding skin was moist without dry skin present today. No acute issues identified today     Assessment and Plan  Kerry Mooney is a 41 y.o. male who suffered a 10 ft fall 04/11/21 resulting in multiple spinal fractures & acute spinal cord injury s/p PLIF C2-4, L1-2 decompression laminectomy with reduction of fx T12-L3 PLA with pedicle screw fixation and local autographing on 04/12/21 at [facility], who then had a dehiscence of his incision. He underwent previous debridement and VAC placement by Neurosurgery on 05/20/21 followed by delayed closure by Plastics. His postoperative course was complicated by dehiscence, and he underwent debridement and VAC placement on 07/19/21.  - Overall the midline posterior wound appears to be decreased in overall size and shape when compared to prior evaluation in clinic.  The wound is now only 3.5x2cm and healthy.   - Continue to leave open to air as much as possible when home  - Cover with Revloc dressing when outside of home  - Avoid any pressure on wound  - Avoid hip flexion, upper extremity horizontal adduction and scapular protraction to minimize tension on wound  -Photographs were taken and placed in the patient's chart for comparison. Included in note above  - Follow up in 8 week unless concerns arise at which time I can see him back sooner  - The patient may continue Humera as it appears to be treating his Crohns well with no adverse wound healing changes since initiation of therapy  -The patient was in agreement with the plan and all questions were addressed to their satisfaction today.      Lenay Lovejoy Isaac Bliss, MPAS   Plastic and Reconstructive Surgery

## 2022-04-08 LAB — ADALIMUMAB/ADALIMUMAB AB: ADALIMUMAB: 13.5 ug/mL

## 2022-04-14 MED FILL — HUMIRA PEN CITRATE FREE 40 MG/0.4 ML: SUBCUTANEOUS | 28 days supply | Qty: 4 | Fill #2

## 2022-04-14 NOTE — Unmapped (Signed)
Kerry Mooney 's HUMIRA(CF) PEN 40 mg/0.4 mL injection (adalimumab) shipment will be delayed as a result of insufficient inventory of the drug.     I have reached out to the patient  at [phone] and communicated the delay. We will call the patient back to reschedule the delivery upon resolution. We have not confirmed the new delivery date.

## 2022-04-14 NOTE — Unmapped (Signed)
Kerry Mooney 's HUMIRA(CF) PEN 40 mg/0.4 mL injection (adalimumab) shipment will be sent out  as a result of sufficient inventory of the drug.      I have reached out to the patient  at [phone] and communicated the delivery change. We will reschedule the medication for the delivery date that the patient agreed upon.  We have confirmed the delivery date as 04/14/2022, via same day courier.

## 2022-04-18 NOTE — Unmapped (Signed)
APPOINTMENTS / QUESTIONS  For NON-urgent questions regarding appointments or your surgical care, you may reach out to your provider and/or their nursing staff via MyChart. Please keep in mind that it may take up to 2-3 business days for a response.    Additionally, you may contact our clinics, Monday through Friday, 8 am to 4:30 pm    [facility]  [address],   [place] [address]  T: [phone]  F: [phone]    [facility]  [address] [place], [place] [address]  Ground Floor [facility]  T: [phone]  F: [phone]    [facility]  [address], [place], [place] [address]  T: [phone]  F: [phone]    [facility] Plastic Surgery at [facility]  [address] [place], [place] [address]  Third Floor, [address]  T: [phone]   F: [phone]    Ambulatory Surgery Center  1st Floor, Ambulatory Care Center  [address]  [place], [place] [address]  T: [phone]  Website:  [facility]    Administrative Office  [address]  [place], [place] [address]  T:  [phone]  F:  [phone]      Physicians:  Epimenio Sarin MD:   Nurse:  Vista Mink BSN, RN [phone]    Surgery Scheduler: Lysle Morales  [phone]     Tarry Kos MD:   Nurse:  Elisabeth Pigeon BSN, RN, CPSN [phone]  Surgery Scheduler:   Lysle Morales  [phone]     Calvert Cantor MD   Nurse: Hendricks Milo BSN, RN [phone]   Surgery Scheduler: Lysle Morales  [phone]     Adeyemi Clydie Braun MD:  Nurse: Jasmine December BSN, RN, [place]  [phone]  Surgery Scheduler: Kristine Royal [phone]    Darleene Cleaver MD:  Nurse: Venetia Maxon BSN, RN, CPSN  [phone]  Surgery Scheduler: Kristine Royal [phone]    Advance Practice Providers:  Ezekiel Slocumb, DNP, NP-C:    Nurse:  Hendricks Milo BSN, RN [phone]  Surgery Scheduler: Kristine Royal [phone]     Rush Landmark, PA-C:    Nurse:  Hendricks Milo BSN, RN [phone]  Surgery Scheduler: Kristine Royal [phone]    Financial Navigator:  Arlee Muslim  Hours:  7:30 am to 4 pm  P: [phone] - F: [phone]           Billing Questions/Financial Navigation:  [phone]  [facility] Customer Service Call Center:  [phone]    Surgery Scheduling:  You will receive a phone call from the surgery schedulers regarding date for surgery in 1-2 weeks from  your appointment.    Please call one of the above numbers if you have not received a phone call 2 weeks after your appointment with the provider.      [facility] Contact Information:   Please call to schedule  *Radiology (CT, X-ray, Ultrasound) [phone]  *Mammography & Breast Ultrasound [phone]  *MRI [phone]  *Interventional Radiology Procedure Scheduling [phone]    AFTER HOURS/HOLIDAYS:  For emergencies after-hours (after 5pm, or weekends): call the [facility] operator [phone]) and ask to page the Plastic Surgery resident on call. You will be directed to a surgery resident who likely is not immediately aware of the details of your case, but can help you deal with any emergencies that cannot wait until regular business hours.  Please be aware that this person  is responding to many in-hospital emergencies and patient issues and may not answer your phone call immediately, but will return your call as soon as possible.    Precare Location:  [employer] Pre-Procedure Services at [facility]   [address]  [place], [place] [address] ([address] - old [place] [address] near [employer])    Blood Work Location:  [address] [place] [address]. There are no suite numbers, but it is located on the first floor there. Go to the main desk and tell them you are there for walk-in labs.  Precare:   The day prior to your scheduled surgery, Pre-Care will call you with instructions.  If you have not heard from them by 4PM, and would like to check on the status of your surgery, please call:  [facility]: [phone]  [facility]: [phone]  [facility]:  [phone]    Insurance Denials:  All appeal initiation needs to be started by the patient.  We do not appeal insurance denials.     [facility] Hotline:  [phone]    FLMA forms and paperwork:  Please allow  a 2 week turn around for forms to be completed and faxed.

## 2022-04-20 NOTE — Unmapped (Signed)
[facility] Plastic Surgery  [place] PA-C    HPI  Kerry Mooney is a 41 y.o. male who suffered a 10 ft fall 04/11/21 resulting in multiple spinal fractures & acute spinal cord injury s/p PLIF C2-4, L1-2 decompression laminectomy with reduction of fx T12-L3 PLA with pedicle screw fixation and local autographing on 04/12/21 at [facility], who then had a dehiscence of his incision. He underwent previous debridement and VAC placement by Neurosurgery on 05/20/21 followed by delayed closure by Plastics. His postoperative course was complicated by dehiscence, and he underwent debridement and VAC placement on 07/19/21. He presents for a wound check.     Interval History:   Overall the patient has been doing excellent since last evaluation in clinic. The patient has been wearing a bandage over the remaining area of wound healing and minimizing and compression to this area. The patient has been excelling in PT/OT and is not free of his wheel chair. The patient has been keeping the area clean and denied any concerns for infection. The patient overall is doing excellent with no concerns reported today.    Past Medical History:   Diagnosis Date    Anemia     Crohn's colitis (CMS-HCC)     Spondylolysis     Ankylosing spondylitis       Patient Active Problem List   Diagnosis    Dens fracture (CMS-HCC)    Crohn's disease of small and large intestines with complication (CMS-HCC)    Arthritis associated with inflammatory bowel disease    Anal condyloma    Wound dehiscence, surgical    Fall    Postoperative wound infection    Pressure injury of skin    Protein-calorie malnutrition, severe (CMS-HCC)    Spondylolysis    Crohn's colitis (CMS-HCC)    Closed burst fracture of lumbar vertebra with routine healing    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)    Routine health maintenance    Seizure disorder (CMS-HCC)       Past Surgical History:   Procedure Laterality Date    HERNIA REPAIR      NECK SURGERY  2016 spinal fusion.has screw in his spine    PR COLONOSCOPY W/BIOPSY SINGLE/MULTIPLE  09/15/2014    Procedure: COLONOSCOPY, FLEXIBLE, PROXIMAL TO SPLENIC FLEXURE; WITH BIOPSY, SINGLE OR MULTIPLE;  Surgeon: Zetta Bills, MD;  Location: [facility];  Service: Gastroenterology    PR COLONOSCOPY W/BIOPSY SINGLE/MULTIPLE N/A 10/06/2016    Procedure: COLONOSCOPY, FLEXIBLE, PROXIMAL TO SPLENIC FLEXURE; WITH BIOPSY, SINGLE OR MULTIPLE;  Surgeon: Zetta Bills, MD;  [facility];  Service: Gastroenterology    PR COLONOSCOPY W/BIOPSY SINGLE/MULTIPLE N/A 01/04/2021    Procedure: COLONOSCOPY, FLEXIBLE, PROXIMAL TO SPLENIC FLEXURE; WITH BIOPSY, SINGLE OR MULTIPLE;  Surgeon: Zetta Bills, MD;  Location: [facility];  Service: Gastroenterology    PR COMPLEX DRAINAGE, WOUND Midline 05/20/2021    Procedure: INCISION & DRAINAGE, COMPLEX, POSTOPERATIVE WOUND INFECTION BACK/PRONE;  Surgeon: Seth Bake, MD;  Location: MAIN OR [facility];  Service: Neurosurgery    PR DEBRIDEMENT, SKIN, SUB-Q TISSUE,=<20 SQ CM Midline 07/19/2021    Procedure: DEBRIDEMENT; SKIN & SUBCUTANEOUS TISSUE TRUNK;  Surgeon: Clarene Duke, MD;  Location: MAIN OR [place];  Service: Plastics    PR DEBRIDEMENT, SKIN, SUB-Q TISSUE,MUSCLE,=<20 SQ CM Midline 06/18/2021    Procedure: DEBRIDEMENT; SKIN, SUBCUTANEOUS TISSUE, & MUSCLE;  Surgeon: Clarene Duke, MD;  Location: MAIN OR [facility];  Service: Plastics  PR IONM 1 ON 1 IN OR W/ATTENDANCE EACH 15 MINUTES N/A 04/25/2014    Procedure: CONTINUOUS INTRAOPERATIVE NEUROPHYSIOLOGY MONITORING IN OR;  Surgeon: Everlean Alstrom, MD;  Location: MAIN OR [place];  Service: Neurosurgery    PR MUSCLE-SKIN FLAP,TRUNK Midline 05/24/2021    Procedure: MUSCLE, MYOCUTANEOUS, OR FASCIOCUTANEOUS FLAP; TRUNK;  Surgeon: Clarene Duke, MD;  Location: MAIN OR [place];  Service: Plastics    PR ODONTOID,FX,GRAFTING,OPEN Midline 04/25/2014    Procedure: OPEN TREATMENT &/OR REDCUCTION ODONTOID FX/DISLOCATION, ANTERIOR APPROACH, INTERNAL FIXATION, WITH GRAFT;  Surgeon: Everlean Alstrom, MD;  Location: MAIN OR [place];  Service: Neurosurgery    PR SURG DIAGNOSTIC EXAM, ANORECTAL N/A 01/21/2021    Procedure: ANORECTAL EXAM, SURGICAL, REQUIRING ANESTHESIA (GENERAL, SPINAL, OR EPIDURAL), DIAGNOSTIC;  Surgeon: Sharyn Lull, MD;  Location: MAIN OR [place];  Service: Gastrointestinal    PR SURG EXCISION OF ANAL LESION(S) N/A 01/21/2021    Procedure: DESTRUCTION OF LESION(S), ANUS (EG, CONDYLOMA, PAPILLOMA, MOLLUSCUM CONTAGIOSUM) SIMPLE; SURGICAL EXCISION;  Surgeon: Sharyn Lull, MD;  Location: MAIN OR [place];  Service: Gastrointestinal    TONSILLECTOMY           Current Outpatient Medications:     ascorbic acid, vitamin C, (VITAMIN C) 500 MG tablet, Take 1 tablet (500 mg total) by mouth daily., Disp: , Rfl:     azaTHIOprine (IMURAN) 50 mg tablet, Take 3 tablets (150 mg total) by mouth daily., Disp: 270 tablet, Rfl: 0    busPIRone (BUSPAR) 15 MG tablet, TAKE 1 TABLET (15 MG TOTAL) BY MOUTH THREE (3) TIMES A DAY., Disp: 270 tablet, Rfl: 3    ferrous sulfate 325 (65 FE) MG tablet, Take 1 tablet (325 mg total) by mouth in the morning., Disp: , Rfl:     HUMIRA PEN CITRATE FREE 40 MG/0.4 ML, Inject the contents of 1 pen (40 mg total) under the skin every seven (7) days., Disp: 4 each, Rfl: 5    HUMIRA PEN CITRATE FREE STARTER PACK FOR CROHN'S/UC/HS 3 X 80 MG/0.8 ML, Inject the contents of 2 pens (160 mg total) day 1, then inject the contents of 1 pen (80 mg total) on day 15 for loading dose., Disp: 3 each, Rfl: 0    levETIRAcetam (KEPPRA) 1000 MG tablet, Take 1 tablet (1,000 mg total) by mouth every twelve (12) hours., Disp: 180 tablet, Rfl: 3    methocarbamoL (ROBAXIN) 500 MG tablet, Take 1 tablet (500 mg total) by mouth two (2) times a day as needed (Pain)., Disp: 90 tablet, Rfl: 1    midazolam 5 mg/spray (0.1 mL) Spry, Give 1 spray (5mg ) into 1 nostril. If no response in 10 min, may give second spray in other nostril. Do not give second dose if patient has trouble breathing or excessive sedation. Max 2 doses/seizure episode, 1 episode every 3 days or 5 episodes/month., Disp: 2 each, Rfl: 4    oxybutynin (DITROPAN) 5 MG tablet, Take 1 tablet (5 mg total) by mouth Three (3) times a day. (Patient taking differently: Take 1 tablet (5 mg total) by mouth in the morning.), Disp: 270 tablet, Rfl: 3    tamsulosin (FLOMAX) 0.4 mg capsule, Take 1 capsule (0.4 mg total) by mouth daily. (Patient taking differently: Take 1 capsule (0.4 mg total) by mouth daily. Evening), Disp: 90 capsule, Rfl: 3    Allergies   Allergen Reactions    Infliximab      Seizure activity  Other reaction(s): Other (See Comments)  Seizures       Family  History   Problem Relation Age of Onset    Autoimmune disease Maternal Grandmother         Psoriasis    Polycystic kidney disease Sister     Anesthesia problems Neg Hx     Ulcerative colitis Neg Hx     Crohn's disease Neg Hx     Colorectal Cancer Neg Hx     Pancreatic cancer Neg Hx     Esophageal cancer Neg Hx        Social History  Accompanied by mother.    ROS  See HPI. All other systems reviewed are negative.    Physical Exam  BP 113/72 (BP Site: R Arm, BP Position: Sitting, BP Cuff Size: Medium)  - Pulse 69  - Temp 37.2 ??C (98.9 ??F) (Temporal)  - Resp 18  - Wt 63.5 kg (140 lb)  - SpO2 98%  - BMI 20.09 kg/m??      General Appearance: No acute distress.   Pulmonary: Normal respiratory effort.   Skin:  Midline thoracic superficial wound with healthy wound bed. Decreased area of overall wound diameter to 1x3cm. No concern for full thickness wound or infection today.  Covered with small island dressing. No TTP. No acute issues identified today         Assessment and Plan  VONTAE KAPLOWITZ is a 41 y.o. male who suffered a 10 ft fall 04/11/21 resulting in multiple spinal fractures & acute spinal cord injury s/p PLIF C2-4, L1-2 decompression laminectomy with reduction of fx T12-L3 PLA with pedicle screw fixation and local autographing on 04/12/21 at [facility], who then had a dehiscence of his incision. He underwent previous debridement and VAC placement by Neurosurgery on 05/20/21 followed by delayed closure by Plastics. His postoperative course was complicated by dehiscence, and he underwent debridement and VAC placement on 07/19/21.  - Overall I was very pleased by the interval healing with the residual wound measuring only 1x3cm.  - He may continued to wear a small bandage over this area when out of the house and can allow for air exposure at home  - Continue to keep the area clean and dry at all times  - Minimize any scapular protraction, extreme hip flexion or extreme shoulder adduction to minimize tension on the midline thoracic wound  - PT/OT ok to continue at this time  - Due to interval healing improvement ok to continue with [facility] treatment for Crohns   - Follow up in 3 months unless concerns arise prior  - All of the family's question and concerns were addressed today and they were pleased with their care.    Dawayne Ohair Isaac Bliss, MPAS  [place] Plastic and Reconstructive Surgery

## 2022-04-21 ENCOUNTER — Ambulatory Visit: Admit: 2022-04-21 | Discharge: 2022-04-22 | Payer: PRIVATE HEALTH INSURANCE

## 2022-04-21 DIAGNOSIS — S21209S Unspecified open wound of unspecified back wall of thorax without penetration into thoracic cavity, sequela: Principal | ICD-10-CM

## 2022-04-25 ENCOUNTER — Ambulatory Visit
Admit: 2022-04-25 | Discharge: 2022-04-26 | Payer: PRIVATE HEALTH INSURANCE | Attending: Spinal Cord Injury Medicine | Primary: Spinal Cord Injury Medicine

## 2022-04-25 DIAGNOSIS — S14151A Other incomplete lesion at C1 level of cervical spinal cord, initial encounter: Principal | ICD-10-CM

## 2022-04-25 NOTE — Unmapped (Signed)
You are doing great! Please plan to return to clinic in 3 months or sooner if you need [place]:-)

## 2022-04-25 NOTE — Unmapped (Signed)
[facility] Physical Medicine and Rehabilitation   Clinic Note      Patient Name:Kerry Mooney  MRN: [id number]  DOB: 1980/09/02  Age: 41 y.o.   Encounter Date: 04/25/2022     ASSESSMENT:     Patient is a 41 y.o. male seen in the SCI clinic for traumatic C2 ASIA D SCI.        RECOMMENDATIONS:     1. Bowel: Will continue taking colace every other day.     2. Bladder: Continue Oxybutynin 5mg  daily and Flomax 0.4mg  nightly.    3. Spasticity: No issues currently    4. Skin: Will continue with pressure reliefs and wound care of his surgical site. Is being closely followed by [facility] Surgery in the outpatient clinic.     5. Neuropathic pain: No issues currently.     6. Equipment: No new equipment needs.     7. Therapy: Graduated now from formalized therapy but will plan to continue to stay active. Can revisit in future if functional goals appreciated.        Follow up: Return in about 3 months (around 07/26/2022).      SUBJECTIVE:     Chief Complaint: C2 ASIA D    History of Present Illness: Patient is a 41 y.o. year old male seen following his AIR stay in 06/2021 and 07/2021. He was admitted initially to [facility] following a fall 10 FT through a ceiling landing on exercise equipment. 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 PLIF C2-4, l1-2 decompression laminectomy with reduction of fx T12-L3 PLA with pedicle screw fixation and local autographing. PMH: seizures, ankylosing spondylitis, TBI and crohns disease. Patient was admitted to [facility] AIR from 10/18-10/24/2022, readmitted to [facility] on 05/03/21 for assessment of cervical wound due to drainage and concern for infection and readmitted to [facility] AIR from 06/01/21 to 06/16/21 before requiring transfer back to acute for wound debridement/ washout.Patient was readmitted again to [facility] AIR from 06/25/21-07/18/21 before being transferred back to [facility] main for wound washout.    At the time of his discharge, patient had improved bladder sensation and was performing timed toileting.  He was discharged on oxybutynin 5 mg 3 times daily as well as Flomax 0.4 mg nightly.  He had also had improved sensation and control with his bowels and was no longer on a formalized bowel program.  He was contact-guard assist with his transfers using a rolling walker and mod I to set up with most ADLs.  He was still primarily using a power wheelchair for mobility.    SCI Injury/level: C2 ASIA D    Interval History:   Surgical wound is still healing. He is being followed closely by Plastic Surgery for his wound and they continue to be pleased with his progress.     Has been going out into the community more of late with no falls or incidents.     1. Bowel: Feels urge and has one BM once daily. Has not had any episodes of incontinence of late. Having solid stools and needs to strain. Patient is taking colace every other day and is back on his Humira for his Chron's.    2. Bladder: Takes Flomax 0.4mg  nightly. Also takes oxybutynin 5mg  daily from TID. Has not had any episodes of urinary incontinence.     3. Spasticity: Patient continues to have no issues with spasticity at this time.    4. Skin: Patient has  no skin breakdown anywhere other than his initial surgical wound.    5. Neuropathic pain: Patient denies any issues with neuropathic pain.    6. Equipment: has been using a rollator for longer community distances and had no assistive device in clinic today.     7. Therapy: Has graduated from formalized therapy and is going out into the community more of late. Walked from his home down to a local shopping center, ran a number or errands, then came back up with no issue using his rollator. Says he plans to join a local gym in the near future to continue to work on his strength and endurance.        Functional History:   Selfcare/feeding: Independent  Dressing: Independent  Transfers: Mod I with rolling walker  Ambulation/Mobility: Mod I with wheelchair or rolling walker  Bladder Management: Independent  Bowel Management: Independent      Past Medical History:   Diagnosis Date    Anemia     Crohn's colitis (CMS-HCC)     Spondylolysis     Ankylosing spondylitis     Past Surgical History:   Procedure Laterality Date    HERNIA REPAIR      NECK SURGERY  2016    spinal fusion.has screw in his spine    PR COLONOSCOPY W/BIOPSY SINGLE/MULTIPLE  09/15/2014    Procedure: COLONOSCOPY, FLEXIBLE, PROXIMAL TO SPLENIC FLEXURE; WITH BIOPSY, SINGLE OR MULTIPLE;  Surgeon: Zetta Bills, MD;  Location: [facility] PROCEDURES [facility];  Service: Gastroenterology    PR COLONOSCOPY W/BIOPSY SINGLE/MULTIPLE N/A 10/06/2016    Procedure: COLONOSCOPY, FLEXIBLE, PROXIMAL TO SPLENIC FLEXURE; WITH BIOPSY, SINGLE OR MULTIPLE;  Surgeon: Zetta Bills, MD;  Location: [facility];  Service: Gastroenterology    PR COLONOSCOPY W/BIOPSY SINGLE/MULTIPLE N/A 01/04/2021    Procedure: COLONOSCOPY, FLEXIBLE, PROXIMAL TO SPLENIC FLEXURE; WITH BIOPSY, SINGLE OR MULTIPLE;  Surgeon: Zetta Bills, MD;  Location: [facility];  Service: Gastroenterology    PR COMPLEX DRAINAGE, WOUND Midline 05/20/2021    Procedure: INCISION & DRAINAGE, COMPLEX, POSTOPERATIVE WOUND INFECTION BACK/PRONE;  Surgeon: Seth Bake, MD;  Location: MAIN OR [facility];  Service: Neurosurgery    PR DEBRIDEMENT, SKIN, SUB-Q TISSUE,=<20 SQ CM Midline 07/19/2021    Procedure: DEBRIDEMENT; SKIN & SUBCUTANEOUS TISSUE TRUNK;  Surgeon: Clarene Duke, MD;  Location: MAIN OR [place];  Service: Plastics    PR DEBRIDEMENT, SKIN, SUB-Q TISSUE,MUSCLE,=<20 SQ CM Midline 06/18/2021    Procedure: DEBRIDEMENT; SKIN, SUBCUTANEOUS TISSUE, & MUSCLE;  Surgeon: Clarene Duke, MD;  Location: MAIN OR [place];  Service: Plastics    PR IONM 1 ON 1 IN OR W/ATTENDANCE EACH 15 MINUTES N/A 04/25/2014    Procedure: CONTINUOUS INTRAOPERATIVE NEUROPHYSIOLOGY MONITORING IN OR;  Surgeon: Everlean Alstrom, MD;  Location: MAIN OR [place];  Service: Neurosurgery    PR MUSCLE-SKIN FLAP,TRUNK Midline 05/24/2021    Procedure: MUSCLE, MYOCUTANEOUS, OR FASCIOCUTANEOUS FLAP; TRUNK;  Surgeon: Clarene Duke, MD;  Location: MAIN OR [place];  Service: Plastics    PR ODONTOID,FX,GRAFTING,OPEN Midline 04/25/2014    Procedure: OPEN TREATMENT &/OR REDCUCTION ODONTOID FX/DISLOCATION, ANTERIOR APPROACH, INTERNAL FIXATION, WITH GRAFT;  Surgeon: Everlean Alstrom, MD;  Location: MAIN OR [place];  Service: Neurosurgery    PR SURG DIAGNOSTIC EXAM, ANORECTAL N/A 01/21/2021    Procedure: ANORECTAL EXAM, SURGICAL, REQUIRING ANESTHESIA (GENERAL, SPINAL, OR EPIDURAL), DIAGNOSTIC;  Surgeon: Sharyn Lull, MD;  Location: MAIN OR [place];  Service: Gastrointestinal    PR SURG EXCISION OF ANAL LESION(S) N/A 01/21/2021  Procedure: DESTRUCTION OF LESION(S), ANUS (EG, CONDYLOMA, PAPILLOMA, MOLLUSCUM CONTAGIOSUM) SIMPLE; SURGICAL EXCISION;  Surgeon: Sharyn Lull, MD;  Location: MAIN OR [facility];  Service: Gastrointestinal    TONSILLECTOMY         Social History:  reports that he has never smoked. He has never used smokeless tobacco. He reports that he does not currently use alcohol. He reports that he does not use drugs.     Family History: Patient denies significant family history.                  MEDICATIONS: Reviewed in EPIC. Pertinent as noted in history.    Allergies: Infliximab    Review of Systems: 10 organ systems reviewed and pertinent as noted in HPI.         OBJECTIVE:     Physical Exam:   Vitals: Wt 65.9 kg (145 lb 4.8 oz)  - BMI 20.85 kg/m??   General: Pleasant gentleman in a transport wheelchair no acute distress  Psych: Pleasant appropriate  Eyes: Anicteric, sclera clear  ENT: Moist mucous membranes  CV: Good perfusion in all extremities  Resp: Regular work of breathing on room air  GI: Soft, nondistended  Cranial Nerves: II-XII grossly intact  Muscle Tone: No increased muscle tone appreciated  ROM: Full range of motion in upper extremities and lower extremities  Gait: Wide based gait. Upper body significantly slouched.     Manual Muscle Testing:    Upper Extremities  Muscle Group    Right    Left  Shoulder Elevators C4  5   5  Elbow Flexors C5   5   5  Wrist Extensor C6  5   5  Elbow Extensor C7   5   5  Finger Flexor C8  5   5  Intrinsic T1   5   5    Lower Extremities  Muscle Group    Right    Left  Hip Flexors L2   4   5  Knee Extensor L3   5   5  Ankle Dorsiflexors L4  4- (to neutral)  5   Plantar flexors S1  5   5      Caro Hight, MD

## 2022-05-10 ENCOUNTER — Ambulatory Visit: Admit: 2022-05-10 | Discharge: 2022-05-11 | Payer: PRIVATE HEALTH INSURANCE

## 2022-05-10 DIAGNOSIS — G40909 Epilepsy, unspecified, not intractable, without status epilepticus: Principal | ICD-10-CM

## 2022-05-10 NOTE — Unmapped (Signed)
[address], [address]  [place], [place] [address]   Phone: [phone]  Referral Fax: [phone]     Date May 10, 2022   Date Last seen: December 14, 2021  Date first seen: December 14, 2021  Name Kerry Mooney  MRN [id number]  Primary care provider Criselda Peaches Lidia Collum, MD  Referring Provider Claudean Kinds    Assessment:   Kerry Mooney is a 41 y.o. right handed man  who returns for the management of epilepsy. The patient has a history of childhood seizures treated with phenobarbital from 19 months for age for 2 years. He had not further seizures until March 2016.  He reports hitting his head prior to seizure recurrence in October 2015. He then progressed to having 3-4 seizures. Seizure sound epileptic. He is currently taking Keppra 1000 mg BID with no use limiting issues.  He has had one seizure since last visit on 05/09/2022, and while some sleep interruption this was not tightly correlated. He reports  taking medications faithfully, as such will increase Keppra to 1500 mg BID. If concerns, will consider Lamictal if skin rash heals.     Otherwise, when probed about his mood, he reported that this is not an issue although PHQ 9 (some what difficult) and GAD 10-10 (not at all difficult)  was noted.  Encouraged to follow up with therapy and also notify me if his mood changes or other concerns.     He has no baseline EEG on file, ordered last visit but not obtained, while a recent brain MRI was limited but grossly normal, personally reviewed images .He will  obtain the 1 hour EEG  once scalp is healed.    Diagnosis:   Epilepsy, NOS    Plan:   1.Increase Keppra to 1000 mg AM and 1500 mg PM for 2 weeks, then 1500 mg twice a day   2. Will plan for the EEG once the scalp is healed.   3. I recommended seizure precautions with regards to avoiding unsupervised water recreational activity, climbing or working at heights, operation of heavy or dangerous machinery, caution around fire and sources of high heat, as well as any other activity which could put you at danger in case of a seizure. Taking a bath is generally not recommended for a patient with uncontrolled epilepsy. I also reviewed the [place] DMV law and recommended to not drive unless approved by the [facility].  4. Recommend therapist   5.  Follow up in 6 months with [name]     I personally spent 45 minutes face-to-face and non-face-to-face in the care of this patient, which includes all pre, intra, and post visit time on the date of service.  All documented time was specific to the E/M visit and does not include any procedures that may have been performed. Extra time was needed as patient and parent were giving varying reports, hence separated them for some independent input.      Chief Complaint    Since last visit on 12/14/2021   ASMS Keppra 1000 mg BID   Side effects : No  Device:No  Seizures: Had a small seizure yesterday, felt ike in daze , had some sleep interruption on Saturday, not observed but confused.   Seizure precautions:Driving- No   New results: Blood, EEG, MRI   Ref Range & Units 12/14/21 1516 03/12/19 1109   Levetiracetam Level  6.0 - 46.0 ug/mL 21.9 12.2 R, CM  Brain MRi 03/03/2022: Unrevealing  Bone health - NO  Mood: . Says [place] stable.   Active health concerns        Seizure history  Child hood febrile seizures from 12 months of age, that might have been complex as he was on phenobarbital for 2 years. No seizures after that       In 04/2014, patient did have C2 dens fracture after slipping down the stairs at home when preparing for work.    On 10/06/2014: The  patient has chron's disease and while at [facility] for first dose of Remicade, he was noted to be unresponsive, undergoing grand mal seizure (unclear duration, likely <5 min), per notes. The patient had vomited on himself and was incontinent of urine. He was noted to have post-ictal confusion and sore, otherwise he feels back to baseline. This was believed to be a provoked seizures     11/05/2014- ER   (Pt was in his usual state of health when he went to bed last night, though was extremely tired (exhausted). He slept later than usual and did not get up during the night to go to the bathroom.  When he did wake up, he rushed to the bathroom because of the urgency, ended up running into the door hitting his head and causing a laceration over his eye. When he turned the lights on in the bathroom, he discovered that he had been incontinent (a trail of diarrhea from the bed to the bathroom). He was unaware of this happening, and even still the whole event remains a bit fuzzy. He has a lingering albeit mild headache all over. He did feel a bit nauseated during the clean-up process, but denied any fevers, pain, blood in stool or any frank LOC): Attributed to GI not seizure.     12/09/2014 -ER When he got home, his mother witnessed a generalized tonic-clonic seizure which lasted less than 5 minutes. He has had postictal state. He sustained no trauma or injury from the seizure. He's not been ill recently with fever, or other systemic symptoms. His Crohn's doctor is switching him to Humira.  Started on Keppra 500 mg BID then per  [facility] for this and  told to continue the medication for a few years. He has not had any recurrent seizures since. 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    07/12/2020- ER Large Grand mal seizure seen on ER at [facility] health and given 2 g of Keppra with a plan to increase Keppra to 750 mg BID. The patient reports that he however had been taking 500 mg BID .     11/09/2020: Another self reported seizure, no medical notes.   In 04/2021: patient did have C2 dens fracture after slipping down the stairs at home when preparing for work and lumbar. The parents think this was due to a seizure, again no medical notes to support this.     07/2021: Has a seizures, reports going to the ER and then in March 2023,  Keppra was increased to 1000 mg BID and no seizures since then.       History of Present Illness  The patient is accompanied by his father, mother called to supplement history . History details were challenging.      Seizure risk factors: The was the product of an uncomplicated pregnancy and delivery.  The patient had a normal development.There a history febrile seizure as an infant or child with phenobarbital used from 19  months for a total of 2 years.  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  There is no family history of seizures or epilepsy. Positive head injury.  Scoliosis.     Onset: The patient had his first seizure as an infant believed to be febrile and encephalitis related.    Number of seizure types: One  Seizure/seizure-like event  description:   - Aura: No clear warning,   - Semiology: Makes a sound, then whole body shaking, eyes open, drool, bites tongue and unclear UI. This lasts about 2-3 minutes, has a post ictal period long time.   Frequency: As a child 19 months, on meds for 2 years, no seizures until 09/2014. Then sporadic 10/2014, 11/2014, 07/2020, 11/2020, 04/2021, 07/2021, and most recently  05/09/2022  Provoking Features: stress not eating.   Work up done   EEG:   vEEG/ EMU evaluation: No   Brain MRI imaging: in 2016 was normal.   Brain MRI imaging 03/03/2022 was normal.     He has been  PB for 2 years (19 months for 2 years),  and no seizures  LEV 1000 mg BID with no side effect from seizure medication.    Admission for status epilepticus: No   Admission for frequent seizures: No  Injuries during seizures or attributed to seizures: Conflicting reports, none per initial evaluation, father states today fracture was from seizure, notes from 10/22 show a fall  (with no clear documentation of seizure preceding this).     Past Medical History: He  has a past medical history of Anemia, Crohn's colitis (CMS-HCC), and Spondylolysis.     Past Surgical History:    Past Surgical History:   Procedure Laterality Date    HERNIA REPAIR      NECK SURGERY  2016    spinal fusion.has screw in his spine    PR COLONOSCOPY W/BIOPSY SINGLE/MULTIPLE  09/15/2014    Procedure: COLONOSCOPY, FLEXIBLE, PROXIMAL TO SPLENIC FLEXURE; WITH BIOPSY, SINGLE OR MULTIPLE;  Surgeon: Zetta Bills, MD;  Location: [facility];  Service: Gastroenterology    PR COLONOSCOPY W/BIOPSY SINGLE/MULTIPLE N/A 10/06/2016    Procedure: COLONOSCOPY, FLEXIBLE, PROXIMAL TO SPLENIC FLEXURE; WITH BIOPSY, SINGLE OR MULTIPLE;  Surgeon: Zetta Bills, MD;  [facility]: [facility];  Service: Gastroenterology    PR COLONOSCOPY W/BIOPSY SINGLE/MULTIPLE N/A 01/04/2021    Procedure: COLONOSCOPY, FLEXIBLE, PROXIMAL TO SPLENIC FLEXURE; WITH BIOPSY, SINGLE OR MULTIPLE;  Surgeon: Zetta Bills, MD;  Location: [facility];  Service: Gastroenterology    PR COMPLEX DRAINAGE, WOUND Midline 05/20/2021    Procedure: INCISION & DRAINAGE, COMPLEX, POSTOPERATIVE WOUND INFECTION BACK/PRONE;  Surgeon: Seth Bake, MD;  Location: MAIN OR [facility];  Service: Neurosurgery    PR DEBRIDEMENT, SKIN, SUB-Q TISSUE,=<20 SQ CM Midline 07/19/2021    Procedure: DEBRIDEMENT; SKIN & SUBCUTANEOUS TISSUE TRUNK;  Surgeon: Clarene Duke, MD;  Location: MAIN OR [place];  Service: Plastics    PR DEBRIDEMENT, SKIN, SUB-Q TISSUE,MUSCLE,=<20 SQ CM Midline 06/18/2021    Procedure: DEBRIDEMENT; SKIN, SUBCUTANEOUS TISSUE, & MUSCLE;  Surgeon: Clarene Duke, MD;  Location: MAIN OR [place];  Service: Plastics    PR IONM 1 ON 1 IN OR W/ATTENDANCE EACH 15 MINUTES N/A 04/25/2014    Procedure: CONTINUOUS INTRAOPERATIVE NEUROPHYSIOLOGY MONITORING IN OR;  Surgeon: Everlean Alstrom, MD;  Location: MAIN OR [facility];  Service: Neurosurgery    PR MUSCLE-SKIN FLAP,TRUNK Midline 05/24/2021    Procedure:  MUSCLE, MYOCUTANEOUS, OR FASCIOCUTANEOUS FLAP; TRUNK;  Surgeon: Clarene Duke, MD;  Location: MAIN OR [facility];  Service: Plastics PR ODONTOID,FX,GRAFTING,OPEN Midline 04/25/2014    Procedure: OPEN TREATMENT &/OR REDCUCTION ODONTOID FX/DISLOCATION, ANTERIOR APPROACH, INTERNAL FIXATION, WITH GRAFT;  Surgeon: Everlean Alstrom, MD;  Location: MAIN OR [place];  Service: Neurosurgery    PR SURG DIAGNOSTIC EXAM, ANORECTAL N/A 01/21/2021    Procedure: ANORECTAL EXAM, SURGICAL, REQUIRING ANESTHESIA (GENERAL, SPINAL, OR EPIDURAL), DIAGNOSTIC;  Surgeon: Sharyn Lull, MD;  Location: MAIN OR [place];  Service: Gastrointestinal    PR SURG EXCISION OF ANAL LESION(S) N/A 01/21/2021    Procedure: DESTRUCTION OF LESION(S), ANUS (EG, CONDYLOMA, PAPILLOMA, MOLLUSCUM CONTAGIOSUM) SIMPLE; SURGICAL EXCISION;  Surgeon: Sharyn Lull, MD;  Location: MAIN OR [place];  Service: Gastrointestinal    TONSILLECTOMY         Family History: His family history includes Autoimmune disease in his maternal grandmother; Polycystic kidney disease in his sister.     Social history: He  reports that he has never smoked. He has never used smokeless tobacco. He reports that he does not currently use alcohol. He reports that he does not use drugs.      Medications:       Current Outpatient Medications:     ascorbic acid, vitamin C, (VITAMIN C) 500 MG tablet, Take 1 tablet (500 mg total) by mouth daily., Disp: , Rfl:     azaTHIOprine (IMURAN) 50 mg tablet, Take 3 tablets (150 mg total) by mouth daily., Disp: 270 tablet, Rfl: 0    busPIRone (BUSPAR) 15 MG tablet, TAKE 1 TABLET (15 MG TOTAL) BY MOUTH THREE (3) TIMES A DAY., Disp: 270 tablet, Rfl: 3    ferrous sulfate 325 (65 FE) MG tablet, Take 1 tablet (325 mg total) by mouth in the morning., Disp: , Rfl:     HUMIRA PEN CITRATE FREE 40 MG/0.4 ML, Inject the contents of 1 pen (40 mg total) under the skin every seven (7) days., Disp: 4 each, Rfl: 5    levETIRAcetam (KEPPRA) 1000 MG tablet, Take 1 tablet (1,000 mg total) by mouth every twelve (12) hours., Disp: 180 tablet, Rfl: 3    methocarbamoL (ROBAXIN) 500 MG tablet, Take 1 tablet (500 mg total) by mouth two (2) times a day as needed (Pain)., Disp: 90 tablet, Rfl: 1    midazolam 5 mg/spray (0.1 mL) Spry, Give 1 spray (5mg ) into 1 nostril. If no response in 10 min, may give second spray in other nostril. Do not give second dose if patient has trouble breathing or excessive sedation. Max 2 doses/seizure episode, 1 episode every 3 days or 5 episodes/month., Disp: 2 each, Rfl: 4    oxybutynin (DITROPAN) 5 MG tablet, Take 1 tablet (5 mg total) by mouth Three (3) times a day. (Patient taking differently: Take 1 tablet (5 mg total) by mouth in the morning.), Disp: 270 tablet, Rfl: 3    tamsulosin (FLOMAX) 0.4 mg capsule, Take 1 capsule (0.4 mg total) by mouth daily. (Patient taking differently: Take 1 capsule (0.4 mg total) by mouth daily. Evening), Disp: 90 capsule, Rfl: 3    HUMIRA PEN CITRATE FREE STARTER PACK FOR CROHN'S/UC/HS 3 X 80 MG/0.8 ML, Inject the contents of 2 pens (160 mg total) day 1, then inject the contents of 1 pen (80 mg total) on day 15 for loading dose. (Patient not taking: Reported on 05/10/2022), Disp: 3 each, Rfl: 0     Allergies: He is allergic to infliximab.    Review  of Systems:      Physical Examination:    Vitals:    05/10/22 1250   BP: 119/67   BP Site: L Arm   BP Position: Sitting   BP Cuff Size: Medium   Pulse: 75   Resp: 16   Weight: 64.7 kg (142 lb 9.6 oz)   Height: 177.8 cm (5' 10)       HE ENT: Normocephalic, atraumatic. Hunched back     On neurologic exam, the patient was alert and oriented to person, place, and time. Memory:fair.  Language was clear and fluent with fair fund of  concentration. WORLD backwards, and Quarters in $7:28 Cranial Nerves: Pupils equal, round, and reactive to light. Visual fields full. Extraocular movements intact. Sensation intact V 1 through V 3 bilaterally. Smile and brow raise were symmetric. Hearing intact to finger rub bilaterally. Tongue, uvula, and palate midline.      Motor: About 4 in the right dorsi and hip flexors, otherwise, other muscles were normal strength   Sensation was intact to light touch throughout.   Coordination: Past pointing in the bilateral hands.   Reflexes: deferred.     Gait was limited with typical, spondylotic gait and guarded.     Other: No seizure activity noted during the exam.

## 2022-05-10 NOTE — Unmapped (Signed)
[facility] Refill Coordination Note    Specialty Medication(s) to be Shipped:   Inflammatory Disorders: Humira    Other medication(s) to be shipped: No additional medications requested for fill at this time     Kerry Mooney, DOB: 01-15-1981  Phone: [phone] (home) [phone] (work)      All above HIPAA information was verified with patient.     Was a Nurse, learning disability used for this call? No    Completed refill call assessment today to schedule patient's medication shipment from the [facility] [phone]).  All relevant notes have been reviewed.     Specialty medication(s) and dose(s) confirmed: Regimen is correct and unchanged.   Changes to medications: Aydren reports no changes at this time.  Changes to insurance: No  New side effects reported not previously addressed with a pharmacist or physician: None reported  Questions for the pharmacist: No    Confirmed patient received a Conservation officer, historic buildings and a Surveyor, mining with first shipment. The patient will receive a drug information handout for each medication shipped and additional FDA Medication Guides as required.       DISEASE/MEDICATION-SPECIFIC INFORMATION        For patients on injectable medications: Patient currently has 1 doses left.  Next injection is scheduled for 05/18/22.    SPECIALTY MEDICATION ADHERENCE     Medication Adherence    Patient reported X missed doses in the last month: 0  Specialty Medication: HUMIRA(CF) PEN 40 mg/0.4 mL injection  Patient is on additional specialty medications: No  Informant: patient                          Were doses missed due to medication being on hold? No    Humira 40 mg/0.75ml: 7 days of medicine on hand       REFERRAL TO PHARMACIST     Referral to the pharmacist: Not needed      [facility]     Shipping address confirmed in Epic.     Delivery Scheduled: Yes, Expected medication delivery date: 05/17/22.     Medication will be delivered via Same Day Courier to the prescription address in Epic WAM.    Jasper Loser   [facility] Specialty Technician

## 2022-05-10 NOTE — Unmapped (Addendum)
1.Increase Keppra to 1000 mg AM and 1500 mg PM for 2 weeks, then 1500 mg twice a day   Please notify me at week 3 so I can change the prescription.   2. Will plan for the EEG once the scalp is healed.   3. I recommended seizure precautions with regards to avoiding unsupervised water recreational activity, climbing or working at heights, operation of heavy or dangerous machinery, caution around fire and sources of high heat, as well as any other activity which could put you at danger in case of a seizure. Taking a bath is generally not recommended for a patient with uncontrolled epilepsy. I also reviewed the [place] DMV law and recommended to not drive unless approved by the [facility].  4. Follow up in 6 months with [name]  or sooner if needed.     Please consider therapy as discussed.

## 2022-05-17 MED FILL — HUMIRA PEN CITRATE FREE 40 MG/0.4 ML: SUBCUTANEOUS | 28 days supply | Qty: 4 | Fill #3

## 2022-06-03 MED ORDER — AZATHIOPRINE 50 MG TABLET
ORAL_TABLET | Freq: Every day | ORAL | 0 refills | 90 days
Start: 2022-06-03 — End: 2022-09-01

## 2022-06-04 DIAGNOSIS — G40909 Epilepsy, unspecified, not intractable, without status epilepticus: Principal | ICD-10-CM

## 2022-06-04 MED ORDER — LEVETIRACETAM 750 MG TABLET
ORAL_TABLET | Freq: Two times a day (BID) | ORAL | 3 refills | 90 days | Status: CP
Start: 2022-06-04 — End: 2023-06-04

## 2022-06-04 NOTE — Unmapped (Signed)
Now tolerating Keppra 1500 mg twice a day.  I changed the script to 750 mg tablet, taking 2 in AM and 2 in PM

## 2022-06-08 MED ORDER — AZATHIOPRINE 50 MG TABLET
ORAL_TABLET | Freq: Every day | ORAL | 0 refills | 90 days | Status: CP
Start: 2022-06-08 — End: 2022-09-06

## 2022-06-14 NOTE — Unmapped (Signed)
[facility] Refill Coordination Note    Specialty Medication(s) to be Shipped:   Inflammatory Disorders: Humira    Other medication(s) to be shipped: No additional medications requested for fill at this time     Kerry Mooney, DOB: 1981/04/15  Phone: [phone] (home) [phone] (work)      All above HIPAA information was verified with patient.     Was a Nurse, learning disability used for this call? No    Completed refill call assessment today to schedule patient's medication shipment from the [facility] [phone]).  All relevant notes have been reviewed.     Specialty medication(s) and dose(s) confirmed: Regimen is correct and unchanged.   Changes to medications: Kerry Mooney reports no changes at this time.  Changes to insurance: No  New side effects reported not previously addressed with a pharmacist or physician: None reported  Questions for the pharmacist: No    Confirmed patient received a Conservation officer, historic buildings and a Surveyor, mining with first shipment. The patient will receive a drug information handout for each medication shipped and additional FDA Medication Guides as required.       DISEASE/MEDICATION-SPECIFIC INFORMATION        For patients on injectable medications: Patient currently has 1 doses left.  Next injection is scheduled for 06/18/22.    SPECIALTY MEDICATION ADHERENCE     Medication Adherence    Patient reported X missed doses in the last month: 0  Specialty Medication: HUMIRA(CF) PEN 40 mg/0.4 mL  Patient is on additional specialty medications: No  Patient is on more than two specialty medications: No  Any gaps in refill history greater than 2 weeks in the last 3 months: no  Demonstrates understanding of importance of adherence: yes  Informant: patient  Reliability of informant: reliable  Provider-estimated medication adherence level: good  Patient is at risk for Non-Adherence: No  Reasons for non-adherence: no problems identified Were doses missed due to medication being on hold? No    HUMIRA(CF) PEN 40 mg/0.4 mL injection (adalimumab) : 7 days of medicine on hand        REFERRAL TO PHARMACIST     Referral to the pharmacist: Not needed      [facility]     Shipping address confirmed in Epic.     Delivery Scheduled: Yes, Expected medication delivery date: 06/20/22.     Medication will be delivered via Same Day Courier to the prescription address in Epic WAM.    Kerry Mooney' W [facility] [facility] Specialty Technician

## 2022-06-20 MED FILL — HUMIRA PEN CITRATE FREE 40 MG/0.4 ML: SUBCUTANEOUS | 28 days supply | Qty: 4 | Fill #4

## 2022-06-29 NOTE — Unmapped (Signed)
Complex Case Management  SUMMARY NOTE    Attempted to contact pt today at Cell number to introduce Complex Case Management services. Left message to return call.; 1st attempt    Discuss at next visit: Introduction to Complex Case Management      Elleah Hemsley - High Risk Care Coordinator   [facility]  [address], [address]  [place], [place] [address]  P: [phone] F: [phone]  [email or web address]

## 2022-07-01 NOTE — Unmapped (Signed)
Complex Case Management  SUMMARY NOTE    Attempted to contact pt today at Cell number to introduce Complex Case Management services. Left message to return call.; 2nd attempt, letter sent.    Discuss at next visit: No answer, letter sent      [name] - High Risk Care Coordinator   [facility]  [address], [address]  [place], [place] [address]  P: [phone] F: [phone]  [email or web address]

## 2022-07-16 NOTE — Unmapped (Signed)
[facility] Refill Coordination Note    Specialty Medication(s) to be Shipped:   Inflammatory Disorders: Humira    Other medication(s) to be shipped: No additional medications requested for fill at this time     Kerry Mooney, DOB: [birth year]  Phone: [phone] (home) [phone] (work)      All above HIPAA information was verified with patient.     Was a Nurse, learning disability used for this call? No    Completed refill call assessment today to schedule patient's medication shipment from the [facility] [phone]).  All relevant notes have been reviewed.     Specialty medication(s) and dose(s) confirmed: Regimen is correct and unchanged.   Changes to medications: [place] reports no changes at this time.  Changes to insurance: No  New side effects reported not previously addressed with a pharmacist or physician: None reported  Questions for the pharmacist: No    Confirmed patient received a Conservation officer, historic buildings and a Surveyor, mining with first shipment. The patient will receive a drug information handout for each medication shipped and additional FDA Medication Guides as required.       DISEASE/MEDICATION-SPECIFIC INFORMATION        For patients on injectable medications: Patient currently has 1 doses left.  Next injection is scheduled for 1/6.    SPECIALTY MEDICATION ADHERENCE     Medication Adherence    Patient reported X missed doses in the last month: 0  Specialty Medication: HUMIRA(CF) PEN 40 mg/0.4 mL  Patient is on additional specialty medications: No                          Were doses missed due to medication being on hold? No    Humira 40/0.4 mg/ml: 1 days of medicine on hand        REFERRAL TO PHARMACIST     Referral to the pharmacist: Not needed      [facility]     Shipping address confirmed in Epic.     Delivery Scheduled: Yes, Expected medication delivery date: 07/21/22.     Medication will be delivered via Same Day Courier to the prescription address in Epic WAM.    Willette Pa   [facility] Specialty Technician

## 2022-07-20 DIAGNOSIS — K50819 Crohn's disease of both small and large intestine with unspecified complications: Principal | ICD-10-CM

## 2022-07-21 NOTE — Unmapped (Incomplete)
APPOINTMENTS / QUESTIONS  For NON-urgent questions regarding appointments or your surgical care, you may reach out to your provider and/or their nursing staff via MyChart. Please keep in mind that it may take up to 2-3 business days for a response.    Additionally, you may contact our clinics, Monday through Friday, 8 am to 4:30 pm    [facility]  [address],   [place] [address]  T: [phone]  F: [phone]    [facility]  [address] [place], [place] [address]  Ground Floor [facility]  T: [phone]  F: [phone]    [facility]  [address] [place], [place] [address]  T: [phone]  F: [phone]    [facility]astic Surgery at [facility]  [address], [place], [place] [address]  Third Floor, [address]  T: [phone]   F: [phone]    Ambulatory Surgery Center  1st Floor, Ambulatory [facility]  [address]  [place], [place] [address]  T: [phone]  Website:  [facility]   [address]  [place], [place] [address]  Website:  [email or web address])     Minor Procedure Room  Multispecialty Clinic  1st Floor [facility]   [address]   [place], [place] [address]    Administrative Office  [address]  [place], [place] [address]  T:  [phone]  F:  [phone]      Physicians:  Epimenio Sarin MD:   Nurse:  Vista Mink BSN, RN [phone]      Tarry Kos MD:   Nurse:  Elisabeth Pigeon BSN, RN, CPSN [phone]    Calvert Cantor MD   Nurse: Hendricks Milo BSN, RN [phone]     Adeyemi Clydie Braun MD:  Nurse: Jasmine December BSN, RN, [place]  [phone]      Advance Practice Providers:  Ezekiel Slocumb, DNP, NP-C:    Nurse:  Hendricks Milo BSN, RN [phone]    Rush Landmark, PA-C:    Nurse:  Hendricks Milo BSN, RN [phone]      Surgery Scheduling:  Vanice Sarah [phone]  You will receive a phone call from the surgery schedulers regarding date for surgery in about 10 business from your appointment.    Please call one of the above numbers if you have not received a phone call 2 weeks after your appointment with the provider.        Financial Navigator:  Arlee Muslim  Hours:  7:30 am to 4 pm  P:  - F: [phone]           Billing Questions/Financial Navigation:  [phone]  [facility] Customer Service Call Center:  [phone]    [facility] Contact Information:   Please call to schedule  *Radiology (CT, X-ray, Ultrasound) [phone]  *Mammography & Breast Ultrasound [phone]  *MRI [phone]  *Interventional Radiology Procedure Scheduling [phone]    AFTER HOURS/HOLIDAYS:  For emergencies after-hours (after 5pm, or weekends): call the [facility] operator [phone]) and ask to page the Plastic Surgery resident on call. You will be directed to a surgery resident who likely is not immediately aware of the details of your case, but can help you deal with any emergencies that cannot wait until regular business hours.  Please be aware that this person is responding to many in-hospital emergencies and patient issues and may  not answer your phone call immediately, but will return your call as soon as possible.    Precare Location:  [employer] Pre-Procedure Services at [facility]   [address]  [place], [place] [address] ([address] - old [place] [address] near [employer])    Blood Work Location:  [address] [place] [address]. There are no suite numbers, but it is located on the first floor there. Go to the main desk and tell them you are there for walk-in labs.  Precare:   The day prior to your scheduled surgery, Pre-Care will call you with instructions.  If you have not heard from them by 4PM, and would like to check on the status of your surgery, please call:  [facility]: [phone]  [facility]: [phone]  [facility]:  [phone]    Insurance Denials:  All appeal initiation needs to be started by the patient.  We do not appeal insurance denials.     Weather Hotline:  [phone]    FLMA forms and paperwork:  Please allow  a 2 week turn around for forms to be completed and faxed.

## 2022-07-21 NOTE — Unmapped (Signed)
[name] 's HUMIRA(CF) PEN 40 mg/0.4 mL injection (adalimumab) shipment will be delayed as a result of a high copay.     I have reached out to the patient  at [phone] and communicated the delay. We will wait for a call back from the patient to reschedule the delivery.  We have not confirmed the new delivery date.

## 2022-07-22 ENCOUNTER — Ambulatory Visit: Admit: 2022-07-22 | Discharge: 2022-07-23 | Payer: PRIVATE HEALTH INSURANCE

## 2022-07-22 DIAGNOSIS — S21209S Unspecified open wound of unspecified back wall of thorax without penetration into thoracic cavity, sequela: Principal | ICD-10-CM

## 2022-07-22 NOTE — Unmapped (Signed)
[facility] Plastic Surgery  [place] PA-C    HPI  Kerry Mooney is a 42 y.o. male who suffered a 10 ft fall 04/11/21 resulting in multiple spinal fractures & acute spinal cord injury s/p PLIF C2-4, L1-2 decompression laminectomy with reduction of fx T12-L3 PLA with pedicle screw fixation and local autographing on 04/12/21 at [facility], who then had a dehiscence of his incision. He underwent previous debridement and VAC placement by Neurosurgery on 05/20/21 followed by delayed closure by Plastics. His postoperative course was complicated by dehiscence, and he underwent debridement and VAC placement on 07/19/21. He presents for a wound check.     Interval History:   Overall the patient has done very well. The patient denied any concerns for erythema, edema, drainage or signs of infection. The parent reports the wound has continued to heal well with no concerns for drainage with a small island dressing placed over the back scar. The patient is not fully functional at home with activities of daily living thanks to the incredible therapy he has received. No active issues or concerns were reported today.    Past Medical History:   Diagnosis Date    Anemia     Crohn's colitis (CMS-HCC)     Spondylolysis     Ankylosing spondylitis       Patient Active Problem List   Diagnosis    Dens fracture (CMS-HCC)    Crohn's disease of small and large intestines with complication (CMS-HCC)    Arthritis associated with inflammatory bowel disease    Anal condyloma    Wound dehiscence, surgical    Fall    Postoperative wound infection    Pressure injury of skin    Protein-calorie malnutrition, severe (CMS-HCC)    Spondylolysis    Crohn's colitis (CMS-HCC)    Closed burst fracture of lumbar vertebra with routine healing    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)    Routine health maintenance    Seizure disorder (CMS-HCC)       Past Surgical History:   Procedure Laterality Date    HERNIA REPAIR      NECK SURGERY  2016    spinal fusion.has screw in his spine    PR COLONOSCOPY W/BIOPSY SINGLE/MULTIPLE  09/15/2014    Procedure: COLONOSCOPY, FLEXIBLE, PROXIMAL TO SPLENIC FLEXURE; WITH BIOPSY, SINGLE OR MULTIPLE;  Surgeon: Zetta Bills, MD;  [facility];  Service: Gastroenterology    PR COLONOSCOPY W/BIOPSY SINGLE/MULTIPLE N/A 10/06/2016    Procedure: COLONOSCOPY, FLEXIBLE, PROXIMAL TO SPLENIC FLEXURE; WITH BIOPSY, SINGLE OR MULTIPLE;  Surgeon: Zetta Bills, MD;  [facility];  Service: Gastroenterology    PR COLONOSCOPY W/BIOPSY SINGLE/MULTIPLE N/A 01/04/2021    Procedure: COLONOSCOPY, FLEXIBLE, PROXIMAL TO SPLENIC FLEXURE; WITH BIOPSY, SINGLE OR MULTIPLE;  Surgeon: Zetta Bills, MD;  Location: [facility] PROC[facility];  Service: Gastroenterology    PR DEBRIDEMENT MUSCLE &/FASCIA 1ST 20 SQ CM/< Midline 06/18/2021    Procedure: DEBRIDEMENT; SKIN, SUBCUTANEOUS TISSUE, & MUSCLE;  Surgeon: Clarene Duke, MD;  Location: MAIN OR [facility];  Service: Plastics    PR DEBRIDEMENT SUBCUTANEOUS TISSUE 1ST 20 SQ CM/< Midline 07/19/2021    Procedure: DEBRIDEMENT; SKIN & SUBCUTANEOUS TISSUE TRUNK;  Surgeon: Clarene Duke, MD;  Location: MAIN OR [place];  Service: Plastics    PR INCISION & DRAINAGE COMPLEX PO WOUND INFECTION Midline 05/20/2021    Procedure: INCISION & DRAINAGE, COMPLEX, POSTOPERATIVE WOUND INFECTION BACK/PRONE;  Surgeon: Illene Bolus Dipakkumar  Sandie Ano, MD;  Location: MAIN OR [place];  Service: Neurosurgery    PR IONM 1 ON 1 IN OR W/ATTENDANCE EACH 15 MINUTES N/A 04/25/2014    Procedure: CONTINUOUS INTRAOPERATIVE NEUROPHYSIOLOGY MONITORING IN OR;  Surgeon: Everlean Alstrom, MD;  Location: MAIN OR [place];  Service: Neurosurgery    PR MUSCLE-SKIN FLAP,TRUNK Midline 05/24/2021    Procedure: MUSCLE, MYOCUTANEOUS, OR FASCIOCUTANEOUS FLAP; TRUNK;  Surgeon: Clarene Duke, MD;  Location: MAIN OR [place];  Service: Plastics    PR ODONTOID,FX,GRAFTING,OPEN Midline 04/25/2014 Procedure: OPEN TREATMENT &/OR REDCUCTION ODONTOID FX/DISLOCATION, ANTERIOR APPROACH, INTERNAL FIXATION, WITH GRAFT;  Surgeon: Everlean Alstrom, MD;  Location: MAIN OR [place];  Service: Neurosurgery    PR SURG DIAGNOSTIC EXAM, ANORECTAL N/A 01/21/2021    Procedure: ANORECTAL EXAM, SURGICAL, REQUIRING ANESTHESIA (GENERAL, SPINAL, OR EPIDURAL), DIAGNOSTIC;  Surgeon: Sharyn Lull, MD;  Location: MAIN OR [place];  Service: Gastrointestinal    PR SURG EXCISION OF ANAL LESION(S) N/A 01/21/2021    Procedure: DESTRUCTION OF LESION(S), ANUS (EG, CONDYLOMA, PAPILLOMA, MOLLUSCUM CONTAGIOSUM) SIMPLE; SURGICAL EXCISION;  Surgeon: Sharyn Lull, MD;  Location: MAIN OR [place];  Service: Gastrointestinal    TONSILLECTOMY           Current Outpatient Medications:     ascorbic acid, vitamin C, (VITAMIN C) 500 MG tablet, Take 1 tablet (500 mg total) by mouth daily., Disp: , Rfl:     azathioprine (IMURAN) 50 mg tablet, Take 3 tablets (150 mg total) by mouth daily., Disp: 270 tablet, Rfl: 0    busPIRone (BUSPAR) 15 MG tablet, TAKE 1 TABLET (15 MG TOTAL) BY MOUTH THREE (3) TIMES A DAY., Disp: 270 tablet, Rfl: 3    ferrous sulfate 325 (65 FE) MG tablet, Take 1 tablet (325 mg total) by mouth in the morning., Disp: , Rfl:     HUMIRA PEN CITRATE FREE 40 MG/0.4 ML, Inject the contents of 1 pen (40 mg total) under the skin every seven (7) days., Disp: 4 each, Rfl: 5    levETIRAcetam (KEPPRA) 750 MG tablet, Take 2 tablets (1,500 mg total) by mouth two (2) times a day., Disp: 360 tablet, Rfl: 3    methocarbamoL (ROBAXIN) 500 MG tablet, Take 1 tablet (500 mg total) by mouth two (2) times a day as needed (Pain)., Disp: 90 tablet, Rfl: 1    midazolam 5 mg/spray (0.1 mL) Spry, Give 1 spray (5mg ) into 1 nostril. If no response in 10 min, may give second spray in other nostril. Do not give second dose if patient has trouble breathing or excessive sedation. Max 2 doses/seizure episode, 1 episode every 3 days or 5 episodes/month., Disp: 2 each, Rfl: 4    oxybutynin (DITROPAN) 5 MG tablet, Take 1 tablet (5 mg total) by mouth Three (3) times a day. (Patient taking differently: Take 1 tablet (5 mg total) by mouth in the morning.), Disp: 270 tablet, Rfl: 3    tamsulosin (FLOMAX) 0.4 mg capsule, Take 1 capsule (0.4 mg total) by mouth daily. (Patient taking differently: Take 1 capsule (0.4 mg total) by mouth daily. Evening), Disp: 90 capsule, Rfl: 3    Allergies   Allergen Reactions    Infliximab      Seizure activity  Other reaction(s): Other (See Comments)  Seizures       Family History   Problem Relation Age of Onset    Autoimmune disease Maternal Grandmother         Psoriasis    Polycystic kidney disease Sister     Anesthesia problems  Neg Hx     Ulcerative colitis Neg Hx     Crohn's disease Neg Hx     Colorectal Cancer Neg Hx     Pancreatic cancer Neg Hx     Esophageal cancer Neg Hx        Social History  Accompanied by father.    ROS  See HPI. All other systems reviewed are negative.    Physical Exam  BP 116/69 (BP Site: L Arm, BP Position: Sitting, BP Cuff Size: Medium)  - Pulse 61  - Temp 36.5 ??C (97.7 ??F) (Temporal)  - Resp 18  - Ht 177.8 cm (5' 10)  - Wt 65.1 kg (143 lb 8 oz)  - SpO2 98%  - BMI 20.59 kg/m??      General Appearance: No acute distress.   Pulmonary: Normal respiratory effort.   Skin:  Midline thoracic wound now fully healed. No concern for wound dehiscence of infection. No residual wound opening.              Assessment and Plan  Kerry Mooney is a 42 y.o. male who suffered a 10 ft fall 04/11/21 resulting in multiple spinal fractures & acute spinal cord injury s/p PLIF C2-4, L1-2 decompression laminectomy with reduction of fx T12-L3 PLA with pedicle screw fixation and local autographing on 04/12/21 at [facility], who then had a dehiscence of his incision. He underwent previous debridement and VAC placement by Neurosurgery on 05/20/21 followed by delayed closure by Plastics. His postoperative course was complicated by dehiscence, and he underwent debridement and VAC placement on 07/19/21.  - Overall I was very pleased today as the wound is now fully healed   - Ok to leave the back scar open to air to avoid moisture   - PT/OT ok to continue   - Follow up as needed  - All of the family's question and concerns were addressed today and they were pleased with their care.    Sun Kihn Isaac Bliss, MPAS  [facility]

## 2022-07-28 NOTE — Unmapped (Signed)
Kerry Mooney 's HUMIRA(CF) PEN 40 mg/0.4 mL injection (adalimumab) shipment will be canceled  as a result of a high copay.     I have reached out to the patient  at [phone] and communicated the delivery change. We will not reschedule the medication and have removed this/these medication(s) from the work request.  We have not confirmed the new delivery date.     Note: 3 failed attempts (1/10, 1/15, 1/16) pt needs to apply for copay card

## 2022-07-31 MED ORDER — METHOCARBAMOL 500 MG TABLET
ORAL_TABLET | Freq: Two times a day (BID) | ORAL | 1 refills | 0 days | PRN
Start: 2022-07-31 — End: ?

## 2022-08-01 MED ORDER — METHOCARBAMOL 500 MG TABLET
ORAL_TABLET | Freq: Two times a day (BID) | ORAL | 1 refills | 45 days | Status: CP | PRN
Start: 2022-08-01 — End: 2023-08-01

## 2022-08-01 NOTE — Unmapped (Signed)
Patient is requesting the following refill  Requested Prescriptions     Pending Prescriptions Disp Refills    methocarbamol (ROBAXIN) 500 MG tablet [Pharmacy Med Name: METHOCARBAMOL 500 MG TABLET] 90 tablet 1     Sig: TAKE 1 TABLET (500 MG TOTAL) BY MOUTH TWO (2) TIMES A DAY AS NEEDED (PAIN).       Recent Visits  Date Type Provider Dept   08/23/21 Office Visit Claudean Kinds, MD [facility]   Showing recent visits within past 365 days with a meds authorizing provider and meeting all other requirements  Future Appointments  Date Type Provider Dept   08/25/22 Appointment Criselda Peaches, Lidia Collum, MD [facility]   Showing future appointments within next 365 days with a meds authorizing provider and meeting all other requirements       Labs: Not applicable this refill

## 2022-08-04 ENCOUNTER — Ambulatory Visit
Admit: 2022-08-04 | Discharge: 2022-08-05 | Payer: PRIVATE HEALTH INSURANCE | Attending: Spinal Cord Injury Medicine | Primary: Spinal Cord Injury Medicine

## 2022-08-04 NOTE — Unmapped (Signed)
You are doing great! Let's plan for you to follow up with me in clinic in 6 months or sooner if you need [place]:-)

## 2022-08-04 NOTE — Unmapped (Unsigned)
[facility] Physical Medicine and Rehabilitation   Clinic Note      Patient Name:Kerry Mooney  MRN: [id number]  DOB: Jun 02, 1981  Age: 42 y.o.   Encounter Date: 08/04/2022     ASSESSMENT:     Patient is a 42 y.o. male seen in the SCI clinic for traumatic C2 ASIA D SCI.        RECOMMENDATIONS:     1. Bowel: Will continue taking colace every other day.     2. Bladder: Continue Oxybutynin 5mg  daily and Flomax 0.4mg  nightly.    3. Spasticity: No issues currently    4. Skin: Will continue with pressure reliefs and wound care of his surgical site. Is being closely followed by [facility] in the outpatient clinic.     5. Neuropathic pain: No issues currently.     6. Equipment: No new equipment needs.     7. Therapy: Graduated now from formalized therapy but will plan to continue to stay active. Can revisit in future if functional goals appreciated.        Follow up: No follow-ups on file.      SUBJECTIVE:     Chief Complaint: C2 ASIA D    History of Present Illness: Patient is a 42 y.o. year old male seen following his AIR stay in 06/2021 and 07/2021. He was admitted initially to [facility] following a fall 10 FT through a ceiling landing on exercise equipment. 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 PLIF C2-4, l1-2 decompression laminectomy with reduction of fx T12-L3 PLA with pedicle screw fixation and local autographing. PMH: seizures, ankylosing spondylitis, TBI and crohns disease. Patient was admitted to [facility] AIR from 10/18-10/24/2022, readmitted to [facility] on 05/03/21 for assessment of cervical wound due to drainage and concern for infection and readmitted to [facility] AIR from 06/01/21 to 06/16/21 before requiring transfer back to acute for wound debridement/ washout.Patient was readmitted again to [facility] AIR from 06/25/21-07/18/21 before being transferred back to [facility] main for wound washout.    At the time of his discharge, patient had improved bladder sensation and was performing timed toileting.  He was discharged on oxybutynin 5 mg 3 times daily as well as Flomax 0.4 mg nightly.  He had also had improved sensation and control with his bowels and was no longer on a formalized bowel program.  He was contact-guard assist with his transfers using a rolling walker and mod I to set up with most ADLs.  He was still primarily using a power wheelchair for mobility.    SCI Injury/level: C2 ASIA D    Interval History:   Surgical wound is still healing. He is being followed closely by Plastic Surgery for his wound and they continue to be pleased with his progress.     Has been going out into the community more of late with no falls or incidents.     1. Bowel: Feels urge and has one BM once daily. Has not had any episodes of incontinence of late. Having solid stools and needs to strain. Patient is taking colace every other day and is back on his Humira for his Chron's.    2. Bladder: Takes Flomax 0.4mg  nightly. Also takes oxybutynin 5mg  daily from TID. Has not had any episodes of urinary incontinence.     3. Spasticity: Patient continues to have no issues with spasticity at this time.    4. Skin: Patient has no skin breakdown  anywhere other than his initial surgical wound.    5. Neuropathic pain: Patient denies any issues with neuropathic pain.    6. Equipment: has been using a rollator for longer community distances and had no assistive device in clinic today.     7. Therapy: Has graduated from formalized therapy and is going out into the community more of late. Walked from his home down to a local shopping center, ran a number or errands, then came back up with no issue using his rollator. Says he plans to join a local gym in the near future to continue to work on his strength and endurance.        Functional History:   Selfcare/feeding: Independent  Dressing: Independent  Transfers: Mod I with rolling walker  Ambulation/Mobility: Mod I with wheelchair or rolling walker  Bladder Management: Independent  Bowel Management: Independent      Past Medical History:   Diagnosis Date    Anemia     Crohn's colitis (CMS-HCC)     Spondylolysis     Ankylosing spondylitis     Past Surgical History:   Procedure Laterality Date    HERNIA REPAIR      NECK SURGERY  2016    spinal fusion.has screw in his spine    PR COLONOSCOPY W/BIOPSY SINGLE/MULTIPLE  09/15/2014    Procedure: COLONOSCOPY, FLEXIBLE, PROXIMAL TO SPLENIC FLEXURE; WITH BIOPSY, SINGLE OR MULTIPLE;  Surgeon: Zetta Bills, MD;  Location: [facility] PROCEDURES [place] [facility];  Service: Gastroenterology    PR COLONOSCOPY W/BIOPSY SINGLE/MULTIPLE N/A 10/06/2016    Procedure: COLONOSCOPY, FLEXIBLE, PROXIMAL TO SPLENIC FLEXURE; WITH BIOPSY, SINGLE OR MULTIPLE;  Surgeon: Zetta Bills, MD;  Location: [facility] PROCEDURES [place] [facility];  Service: Gastroenterology    PR COLONOSCOPY W/BIOPSY SINGLE/MULTIPLE N/A 01/04/2021    Procedure: COLONOSCOPY, FLEXIBLE, PROXIMAL TO SPLENIC FLEXURE; WITH BIOPSY, SINGLE OR MULTIPLE;  Surgeon: Zetta Bills, MD;  Location: [facility] [place] [facility];  Service: Gastroenterology    PR DEBRIDEMENT MUSCLE &/FASCIA 1ST 20 SQ CM/< Midline 06/18/2021    Procedure: DEBRIDEMENT; SKIN, SUBCUTANEOUS TISSUE, & MUSCLE;  Surgeon: Clarene Duke, MD;  Location: MAIN OR [place];  Service: Plastics    PR DEBRIDEMENT SUBCUTANEOUS TISSUE 1ST 20 SQ CM/< Midline 07/19/2021    Procedure: DEBRIDEMENT; SKIN & SUBCUTANEOUS TISSUE TRUNK;  Surgeon: Clarene Duke, MD;  Location: MAIN OR [place];  Service: Plastics    PR INCISION & DRAINAGE COMPLEX PO WOUND INFECTION Midline 05/20/2021    Procedure: INCISION & DRAINAGE, COMPLEX, POSTOPERATIVE WOUND INFECTION BACK/PRONE;  Surgeon: Seth Bake, MD;  Location: MAIN OR [place];  Service: Neurosurgery    PR IONM 1 ON 1 IN OR W/ATTENDANCE EACH 15 MINUTES N/A 04/25/2014    Procedure: CONTINUOUS INTRAOPERATIVE NEUROPHYSIOLOGY MONITORING IN OR;  Surgeon: Everlean Alstrom, MD;  Location: MAIN OR [place];  Service: Neurosurgery    PR MUSCLE-SKIN FLAP,TRUNK Midline 05/24/2021    Procedure: MUSCLE, MYOCUTANEOUS, OR FASCIOCUTANEOUS FLAP; TRUNK;  Surgeon: Clarene Duke, MD;  Location: MAIN OR [place];  Service: Plastics    PR ODONTOID,FX,GRAFTING,OPEN Midline 04/25/2014    Procedure: OPEN TREATMENT &/OR REDCUCTION ODONTOID FX/DISLOCATION, ANTERIOR APPROACH, INTERNAL FIXATION, WITH GRAFT;  Surgeon: Everlean Alstrom, MD;  Location: MAIN OR [place];  Service: Neurosurgery    PR SURG DIAGNOSTIC EXAM, ANORECTAL N/A 01/21/2021    Procedure: ANORECTAL EXAM, SURGICAL, REQUIRING ANESTHESIA (GENERAL, SPINAL, OR EPIDURAL), DIAGNOSTIC;  Surgeon: Sharyn Lull, MD;  Location: MAIN OR [place];  Service: Gastrointestinal    PR SURG EXCISION OF ANAL LESION(S) N/A  01/21/2021    Procedure: DESTRUCTION OF LESION(S), ANUS (EG, CONDYLOMA, PAPILLOMA, MOLLUSCUM CONTAGIOSUM) SIMPLE; SURGICAL EXCISION;  Surgeon: Sharyn Lull, MD;  Location: MAIN OR [facility];  Service: Gastrointestinal    TONSILLECTOMY         Social History:  reports that he has never smoked. He has never used smokeless tobacco. He reports that he does not currently use alcohol. He reports that he does not use drugs.     Family History: Patient denies significant family history.                  MEDICATIONS: Reviewed in EPIC. Pertinent as noted in history.    Allergies: Infliximab    Review of Systems: 10 organ systems reviewed and pertinent as noted in HPI.         OBJECTIVE:     Physical Exam:   Vitals: Wt 65 kg (143 lb 3.2 oz)  - BMI 20.55 kg/m??   General: Pleasant gentleman in a transport wheelchair no acute distress  Psych: Pleasant appropriate  Eyes: Anicteric, sclera clear  ENT: Moist mucous membranes  CV: Good perfusion in all extremities  Resp: Regular work of breathing on room air  GI: Soft, nondistended  Cranial Nerves: II-XII grossly intact  Muscle Tone: No increased muscle tone appreciated  ROM: Full range of motion in upper extremities and lower extremities  Gait: Wide based gait. Upper body significantly slouched.     Manual Muscle Testing:    Upper Extremities  Muscle Group    Right    Left  Shoulder Elevators C4  5   5  Elbow Flexors C5   5   5  Wrist Extensor C6  5   5  Elbow Extensor C7   5   5  Finger Flexor C8  5   5  Intrinsic T1   5   5    Lower Extremities  Muscle Group    Right    Left  Hip Flexors L2   4   5  Knee Extensor L3   5   5  Ankle Dorsiflexors L4  4- (to neutral)  5   Plantar flexors S1  5   5      Caro Hight, MD

## 2022-08-17 NOTE — Unmapped (Signed)
[facility]  Established Patient Clinic Note    Assessment/Plan:   Problem List Items Addressed This Visit       Routine health maintenance - Primary       // Screening and Prevention Men  - Lifestyle changes recommended including increased exercise and moderation of caloric intake.  - Recommended regular dental and vision screenings  - Recommended regular sunscreen use to prevent skin cancer and routine FBSEs.  - Advised smoking cessation. not applicable  - CAGE = 0.  Discussed moderation of alcohol intake.  - Discussed hepatitis C risks and screening: not indicated  - Discussed STI screening:  not indicated  - Diabetes screening:  not indicated  - Cholesterol screening: ordered  - Colorectal Screening:  not indicated  - PSA Screening: not indicated  - Low dose CT screening for lung cancer: not applicable  - AAA screening: not indicated  - Advanced Care Planning Note Will discuss at next AWV.     // Vaccines   - seasonal influenza: Already has  - TDaP or TD:  Already has  - COVID: Will get at local pharmacy  - HPV: Not Indicated  - Shingrix: Not Indicated  - pneumovax: Not Indicated             Relevant Orders    Hemoglobin A1c    Lipid Panel    Comprehensive Metabolic Panel    CBC    Flexural eczema     // Eczema   - Symptoms of itch exacerbated by fragranced products and cold weather over scalp, face, neck, chest, bilateral AC fossa and left lower extremity  - Treatment: use mild soaps with lotions in them (Camay - Dove), moisturizers - Alpha Keri/Vaseline, over the counter 1% hydrocortisone, and trimcinalone.  Good quality lotion at least twice a day.         Relevant Medications    triamcinolone (KENALOG) 0.1 % ointment    fluocinonide (LIDEX) 0.05 % external solution       HEALTH MAINTENANCE ITEMS STILL DUE:  Health Maintenance Due   Topic Date Due    Lipid Screening  Never done    COVID-19 Vaccine (4 - 2023-24 season) 03/11/2022       PHQ-2 Score:      PHQ-9 Score: 3    Edinburgh Score: Screening complete, no depression identified / no further action needed today    Subjective   Kerry Mooney is a 42 y.o. male  coming to clinic today for the following issues:    Chief Complaint   Patient presents with    wac     Bilateral rash on arms, hands, chest and neck          HPI    New Rash on BL elbows, face, chest, hands, L leg. Itchy. Red. Better after changing to dove soap.     // Wellness:   - Movement: moderate. Has joined the gym and working to walk more.   - Nutrition: eats whatever he/she desires  - Sleep: 7-8HRS and notes  wakes up early  - Stress: Low       Kerry Mooney is a 42 y.o. male, with the following medical problems as documented above:      ROS    I have reviewed the problem list, medications, and allergies and have updated/reconciled them if needed.    Mr. Medeiros  reports that he has never smoked. He has never used smokeless tobacco.    Objective  VITALS: BP 130/80  - Pulse 63  - Temp 36.7 ??C (98.1 ??F) (Oral)  - Resp 14  - Wt 63.9 kg (140 lb 12.8 oz)  - SpO2 98%  - BMI 20.20 kg/m??     Wt Readings from Last 6 Encounters:   08/25/22 63.9 kg (140 lb 12.8 oz)   08/04/22 65 kg (143 lb 3.2 oz)   07/22/22 65.1 kg (143 lb 8 oz)   05/10/22 64.7 kg (142 lb 9.6 oz)   04/25/22 65.9 kg (145 lb 4.8 oz)   04/21/22 63.5 kg (140 lb)        Physical Exam  Vitals reviewed.   Constitutional:       General: He is not in acute distress.     Appearance: Normal appearance. He is well-developed. He is not diaphoretic.   HENT:      Head: Normocephalic and atraumatic.      Right Ear: Hearing normal.      Left Ear: Hearing normal.      Nose: Nose normal.   Eyes:      General: No scleral icterus.        Right eye: No discharge.         Left eye: No discharge.      Conjunctiva/sclera: Conjunctivae normal.      Right eye: Right conjunctiva is not injected.      Left eye: Left conjunctiva is not injected.   Neck:      Thyroid: No thyroid mass.      Vascular: No carotid bruit or JVD.      Trachea: Trachea and phonation normal.   Cardiovascular:      Rate and Rhythm: Normal rate and regular rhythm. No extrasystoles are present.     Pulses: Normal pulses.           Radial pulses are 2+ on the right side and 2+ on the left side.      Heart sounds: Normal heart sounds. No murmur heard.  Pulmonary:      Effort: Pulmonary effort is normal. No respiratory distress.      Breath sounds: Normal breath sounds. No wheezing or rales.   Musculoskeletal:         General: Deformity present.      Cervical back: Normal range of motion.      Right lower leg: No edema.      Left lower leg: No edema.   Skin:     General: Skin is warm and dry.      Capillary Refill: Capillary refill takes less than 2 seconds.      Findings: Erythema and rash present. No bruising.      Nails: There is no clubbing.      Comments: Diffuse xerosis with erythematous plaques on the on the trunk and extremities, most prominently in the flexural areas with moderate lichenification, erythema, and pigment alteration focally.     Neurological:      Mental Status: He is alert and oriented to person, place, and time. Mental status is at baseline.      Cranial Nerves: No cranial nerve deficit.      Motor: Weakness present.      Coordination: Coordination abnormal.      Gait: Gait normal.   Psychiatric:         Mood and Affect: Mood normal. Mood is not anxious.         Speech: Speech normal.         Behavior:  Behavior normal.         Thought Content: Thought content normal.         Judgment: Judgment normal.         LABS/IMAGING  I have reviewed pertinent recent labs and imaging in Epic    Veatrice Kells, MD  [facility]  [facility]   [address] [place], [place] [address]  Telephone [phone]  Fax [phone]

## 2022-08-18 NOTE — Unmapped (Signed)
[facility] Pharm[facility] Refill Coordination Note    Specialty Medication(s) to be Shipped:   Inflammatory Disorders: Humira    Other medication(s) to be shipped: No additional medications requested for fill at this time     Kerry Mooney, DOB: Mar 06, 1981  Phone: [phone] (home) [phone] (work)      All above HIPAA information was verified with patient.     Was a Nurse, learning disability used for this call? No    Completed refill call assessment today to schedule patient's medication shipment from the [facility] [phone]).  All relevant notes have been reviewed.     Specialty medication(s) and dose(s) confirmed: Regimen is correct and unchanged.   Changes to medications: Andru reports no changes at this time.  Changes to insurance: No  New side effects reported not previously addressed with a pharmacist or physician: None reported  Questions for the pharmacist: No    Confirmed patient received a Conservation officer, historic buildings and a Surveyor, mining with first shipment. The patient will receive a drug information handout for each medication shipped and additional FDA Medication Guides as required.       DISEASE/MEDICATION-SPECIFIC INFORMATION        For patients on injectable medications: Patient currently has 0 doses left.  Next injection is scheduled for 08/21/22.    SPECIALTY MEDICATION ADHERENCE     Medication Adherence    Patient reported X missed doses in the last month: 1  Specialty Medication: HUMIRA(CF) PEN 40 mg/0.4 mL  Patient is on additional specialty medications: No  Informant: patient                       Were doses missed due to medication being on hold? No    Humira 40/0.4 mg/ml: 0 days of medicine on hand        REFERRAL TO PHARMACIST     Referral to the pharmacist: Not needed      [facility]     Shipping address confirmed in Epic.     Delivery Scheduled: Yes, Expected medication delivery date: 08/19/22.     Medication will be delivered via Same Day Courier to the prescription address in Epic [place].    Wyatt Mage M Elisabeth Cara   [facility] Specialty Technician

## 2022-08-19 NOTE — Unmapped (Signed)
Kerry Mooney 's HUMIRA(CF) PEN 40 mg/0.4 mL injection (adalimumab) shipment will be delayed as a result of a high copay.     I have reached out to the patient  at [phone] and communicated the delay. We will wait for a call back from the patient to reschedule the delivery.  We have not confirmed the new delivery date.

## 2022-08-23 MED FILL — HUMIRA PEN CITRATE FREE 40 MG/0.4 ML: SUBCUTANEOUS | 28 days supply | Qty: 4 | Fill #5

## 2022-08-25 ENCOUNTER — Ambulatory Visit: Admit: 2022-08-25 | Discharge: 2022-08-26 | Payer: PRIVATE HEALTH INSURANCE

## 2022-08-25 DIAGNOSIS — L2082 Flexural eczema: Principal | ICD-10-CM

## 2022-08-25 DIAGNOSIS — Z0000 Encounter for general adult medical examination without abnormal findings: Principal | ICD-10-CM

## 2022-08-25 LAB — COMPREHENSIVE METABOLIC PANEL
ALBUMIN: 4.3 g/dL (ref 3.4–5.0)
ALKALINE PHOSPHATASE: 73 U/L (ref 46–116)
ALT (SGPT): 9 U/L — ABNORMAL LOW (ref 10–49)
ANION GAP: 8 mmol/L (ref 5–14)
AST (SGOT): 20 U/L (ref <=34)
BILIRUBIN TOTAL: 0.5 mg/dL (ref 0.3–1.2)
BLOOD UREA NITROGEN: 13 mg/dL (ref 9–23)
BUN / CREAT RATIO: 18
CALCIUM: 9.8 mg/dL (ref 8.7–10.4)
CHLORIDE: 107 mmol/L (ref 98–107)
CO2: 27.4 mmol/L (ref 20.0–31.0)
CREATININE: 0.73 mg/dL
EGFR CKD-EPI (2021) MALE: >90 mL/min/{1.73_m2} (ref >=60)
GLUCOSE RANDOM: 103 mg/dL (ref 70–179)
POTASSIUM: 4.9 mmol/L — ABNORMAL HIGH (ref 3.4–4.8)
PROTEIN TOTAL: 8.5 g/dL — ABNORMAL HIGH (ref 5.7–8.2)
SODIUM: 142 mmol/L (ref 135–145)

## 2022-08-25 LAB — HEMOGLOBIN A1C
ESTIMATED AVERAGE GLUCOSE: 103 mg/dL
HEMOGLOBIN A1C: 5.2 % (ref 4.8–5.6)

## 2022-08-25 LAB — LIPID PANEL
CHOLESTEROL/HDL RATIO SCREEN: 2.2 (ref 1.0–4.5)
CHOLESTEROL: 148 mg/dL (ref <=200)
HDL CHOLESTEROL: 66 mg/dL — ABNORMAL HIGH (ref 40–60)
LDL CHOLESTEROL CALCULATED: 69 mg/dL (ref 40–99)
NON-HDL CHOLESTEROL: 82 mg/dL (ref 70–130)
TRIGLYCERIDES: 63 mg/dL (ref 0–150)
VLDL CHOLESTEROL CAL: 12.6 mg/dL (ref 11–50)

## 2022-08-25 LAB — CBC
HEMATOCRIT: 42.9 % (ref 39.0–48.0)
HEMOGLOBIN: 14 g/dL (ref 12.9–16.5)
MEAN CORPUSCULAR HEMOGLOBIN CONC: 32.7 g/dL (ref 32.0–36.0)
MEAN CORPUSCULAR HEMOGLOBIN: 32.5 pg — ABNORMAL HIGH (ref 25.9–32.4)
MEAN CORPUSCULAR VOLUME: 99.3 fL — ABNORMAL HIGH (ref 77.6–95.7)
MEAN PLATELET VOLUME: 7.6 fL (ref 6.8–10.7)
PLATELET COUNT: 283 10*9/L (ref 150–450)
RED BLOOD CELL COUNT: 4.32 10*12/L (ref 4.26–5.60)
RED CELL DISTRIBUTION WIDTH: 16.9 % — ABNORMAL HIGH (ref 12.2–15.2)
WBC ADJUSTED: 13.3 10*9/L — ABNORMAL HIGH (ref 3.6–11.2)

## 2022-08-25 MED ORDER — FLUOCINONIDE 0.05 % TOPICAL SOLUTION
Freq: Two times a day (BID) | TOPICAL | 0 refills | 0 days | Status: CP
Start: 2022-08-25 — End: 2023-08-25

## 2022-08-25 MED ORDER — TRIAMCINOLONE ACETONIDE 0.1 % TOPICAL OINTMENT
Freq: Two times a day (BID) | TOPICAL | 2 refills | 0 days | Status: CP
Start: 2022-08-25 — End: 2023-08-25

## 2022-08-25 NOTE — Unmapped (Signed)
//   Eczema   - Symptoms of itch exacerbated by fragranced products and cold weather over scalp, face, neck, chest, bilateral AC fossa and left lower extremity  - Treatment: use mild soaps with lotions in them (Camay - Dove), moisturizers - Alpha Keri/Vaseline, over the counter 1% hydrocortisone, and trimcinalone.  Good quality lotion at least twice a day.

## 2022-08-25 NOTE — Unmapped (Signed)
//   Screening and Prevention Men  - Lifestyle changes recommended including increased exercise and moderation of caloric intake.  - Recommended regular dental and vision screenings  - Recommended regular sunscreen use to prevent skin cancer and routine FBSEs.  - Advised smoking cessation. not applicable  - CAGE = 0.  Discussed moderation of alcohol intake.  - Discussed hepatitis C risks and screening: not indicated  - Discussed STI screening:  not indicated  - Diabetes screening:  not indicated  - Cholesterol screening: ordered  - Colorectal Screening:  not indicated  - PSA Screening: not indicated  - Low dose CT screening for lung cancer: not applicable  - AAA screening: not indicated  - Advanced Care Planning Note Will discuss at next AWV.     // Vaccines   - seasonal influenza: Already has  - TDaP or TD:  Already has  - COVID: Will get at local pharmacy  - HPV: Not Indicated  - Shingrix: Not Indicated  - pneumovax: Not Indicated

## 2022-08-30 ENCOUNTER — Ambulatory Visit: Admit: 2022-08-30 | Discharge: 2022-08-31 | Payer: PRIVATE HEALTH INSURANCE

## 2022-09-01 NOTE — Unmapped (Signed)
PROCEDURE: PROLONGED ROUTINE EEG    Patient: Kerry Mooney  Date of Birth: [birth year]  Attending: Florian Buff, M.D.  Ordering Provider: Claudean Kinds, MD  [facility] No: [id number]      DATE STARTED: 08/30/2022 13:56  DATE ENDED: 08/30/2022 15:03     HISTORY:  [name] is a 42 y.o. with a history of Epilepsy, Anemia and Crohn's Colitis.  This EEG was requested to evaluate for seizures and establish a baseline.     SEIZURE MEDICATIONS:  levetiracetam (Keppra) 1500 mg twice daily    DESCRIPTION:  The recording was performed on an XLTEK Digital EEG machine utilizing 21 active electrodes placed according to the international 10-20 system.  The study was recorded digitally with a bandpass of 1-70Hz  and a sampling rate of 200Hz  and was reviewed with the possibility of multiple reformatting.     Awake  The waking background showed appropriate organization with clearly defined anterior-posterior voltage and frequency gradients. Background comprised of predominantly low voltage alpha and beta frequencies with intermittent theta>delta frequencies seen over the left temporal region. There was a well-defined posterior dominant rhythm of 12 Hertz, which was symmetrical and showed normal reactivity.  Anteriorly, there was an expected pattern of lower voltage, irregular, mixed faster frequencies.      Sleep  Drowsiness is characterized by attenuation of the background, but no N2 sleep structures were observed.     HV/Photic Stimulation  Hyperventilation and photic stimulation were not performed    Interictal Activity:  - Occasional sharp transients in the left temporal region, maximal at F7>T3.     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 There were occasional sharp transients seen in the left temporal region which are highly suspicious but not clearly epileptiform in morphology.      No seizures or push button events were captured during this recording.    Crist Fat, MD  Epilepsy Fellow  [facility] Department of Neurology     ---------------------------  I concurrently reviewed this routine EEG with epilepsy fellow, Deepthi Nalluri.  I reviewed the fellow's note.  I agree with the fellow's findings and interpretation.     [place]ian Buff, M.D.

## 2022-09-09 DIAGNOSIS — K50819 Crohn's disease of both small and large intestine with unspecified complications: Principal | ICD-10-CM

## 2022-09-09 MED ORDER — HUMIRA PEN CITRATE FREE 40 MG/0.4 ML
SUBCUTANEOUS | 3 refills | 28 days | Status: CP
Start: 2022-09-09 — End: ?
  Filled 2022-09-15: qty 4, 28d supply, fill #0

## 2022-09-09 NOTE — Unmapped (Signed)
Refill request for Humira sent to the pharmacy for 4 month supply. Pt has appt scheduled for 12/27/22 with Dr. Raphael Gibney.

## 2022-09-09 NOTE — Unmapped (Signed)
[facility] Refill Coordination Note    Specialty Medication(s) to be Shipped:   Inflammatory Disorders: Humira    Other medication(s) to be shipped: No additional medications requested for fill at this time     Kerry Mooney, DOB: 1980/12/22  Phone: [phone] (home) [phone] (work)      All above HIPAA information was verified with patient.     Was a Nurse, learning disability used for this call? No    Completed refill call assessment today to schedule patient's medication shipment from the [facility] [phone]).  All relevant notes have been reviewed.     Specialty medication(s) and dose(s) confirmed: Regimen is correct and unchanged.   Changes to medications: Natrone reports no changes at this time.  Changes to insurance: No  New side effects reported not previously addressed with a pharmacist or physician: None reported  Questions for the pharmacist: No    Confirmed patient received a Conservation officer, historic buildings and a Surveyor, mining with first shipment. The patient will receive a drug information handout for each medication shipped and additional FDA Medication Guides as required.       DISEASE/MEDICATION-SPECIFIC INFORMATION        For patients on injectable medications: Patient currently has 2 doses left.  Next injection is scheduled for 3/3.    SPECIALTY MEDICATION ADHERENCE     Medication Adherence    Patient reported X missed doses in the last month: 0  Specialty Medication: HUMIRA(CF) PEN 40 mg/0.4 mL  Patient is on additional specialty medications: No              Were doses missed due to medication being on hold? No    Humira 40/0.4 mg/ml: 2 days of medicine on hand        REFERRAL TO PHARMACIST     Referral to the pharmacist: Not needed      [facility]     Shipping address confirmed in Epic.     Patient was notified of new phone menu : No    Delivery Scheduled: Yes, Expected medication delivery date: 09/15/22.  However, Rx request for refills was sent to the provider as there are none remaining.     Medication will be delivered via Same Day Courier to the prescription address in Epic [place].    Willette Pa   [facility] Specialty Technician

## 2022-10-01 MED ORDER — BUSPIRONE 15 MG TABLET
ORAL_TABLET | Freq: Three times a day (TID) | ORAL | 3 refills | 0 days
Start: 2022-10-01 — End: ?

## 2022-10-01 NOTE — Unmapped (Signed)
Patient is requesting the following refill  Requested Prescriptions     Pending Prescriptions Disp Refills    busPIRone (BUSPAR) 15 MG tablet [Pharmacy Med Name: BUSPIRONE HCL 15 MG TABLET] 270 tablet 3     Sig: TAKE 1 TABLET (15 MG TOTAL) BY MOUTH THREE (3) TIMES A DAY.       Recent Visits  Date Type Provider Dept   08/25/22 Office Visit Claudean Kinds, MD [facility]   Showing recent visits within past 365 days with a meds authorizing provider and meeting all other requirements  Future Appointments  Date Type Provider Dept   09/01/23 Appointment Criselda Peaches Lidia Collum, MD [facility]   Showing future appointments within next 365 days with a meds authorizing provider and meeting all other requirements

## 2022-10-03 MED ORDER — BUSPIRONE 15 MG TABLET
ORAL_TABLET | Freq: Three times a day (TID) | ORAL | 3 refills | 90 days | Status: CP
Start: 2022-10-03 — End: 2023-10-03

## 2022-10-11 NOTE — Unmapped (Signed)
[facility] Refill Coordination Note    Specialty Medication(s) to be Shipped:   Inflammatory Disorders: Humira    Other medication(s) to be shipped: No additional medications requested for fill at this time     LOWEN WIRTANEN, DOB: 01-Apr-1981  Phone: [phone] (home) [phone] (work)      All above HIPAA information was verified with patient.     Was a Nurse, learning disability used for this call? No    Completed refill call assessment today to schedule patient's medication shipment from the [facility] [phone]).  All relevant notes have been reviewed.     Specialty medication(s) and dose(s) confirmed: Regimen is correct and unchanged.   Changes to medications: Davell reports no changes at this time.  Changes to insurance: No  New side effects reported not previously addressed with a pharmacist or physician: None reported  Questions for the pharmacist: No    Confirmed patient received a Conservation officer, historic buildings and a Surveyor, mining with first shipment. The patient will receive a drug information handout for each medication shipped and additional FDA Medication Guides as required.       DISEASE/MEDICATION-SPECIFIC INFORMATION        For patients on injectable medications: Patient currently has 1 doses left.  Next injection is scheduled for 4/8.    SPECIALTY MEDICATION ADHERENCE     Medication Adherence    Patient reported X missed doses in the last month: 0  Specialty Medication: HUMIRA(CF) PEN 40 mg/0.4 mL  Patient is on additional specialty medications: No              Were doses missed due to medication being on hold? No    Humira 40/0.4 mg/ml: 6 days of medicine on hand        REFERRAL TO PHARMACIST     Referral to the pharmacist: Not needed      [facility]     Shipping address confirmed in Epic.     Patient was notified of new phone menu : No    Delivery Scheduled: Yes, Expected medication delivery date: 10/19/22.     Medication will be delivered via Same Day Courier to the prescription address in Epic WAM.    Willette Pa   [facility] Specialty Technician

## 2022-10-19 MED FILL — HUMIRA PEN CITRATE FREE 40 MG/0.4 ML: SUBCUTANEOUS | 28 days supply | Qty: 4 | Fill #1

## 2022-11-03 NOTE — Unmapped (Signed)
Complex Case Management  SUMMARY NOTE    High Risk Care Coordinator  spoke with patient and verified correct patient using two identifiers today to introduce the Complex Case Management program.     Discussed the following:  Program Services, Expectations of participation, and Verified Demographics    Program status: Declined    Adi Seales-High Risk Care Coordinator  [facility]  [address], [address]  [place], [place] [address]  She/Her/Hers  P: [phone] F: [phone]  [email or web address]

## 2022-11-09 DIAGNOSIS — K50819 Crohn's disease of both small and large intestine with unspecified complications: Principal | ICD-10-CM

## 2022-11-09 NOTE — Unmapped (Signed)
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  Gives doses on Sundays and has 2 pens left on hand for 5/5 & 5/12    Kerry Mooney, DOB: 26-Sep-1980  Phone: [phone] (home) [phone] (work)    All above HIPAA information was verified with patient.     Was a Nurse, learning disability used for this call? No    Specialty Medication(s):   Inflammatory Disorders: Humira     Current Outpatient Medications   Medication Sig Dispense Refill    ascorbic acid, vitamin C, (VITAMIN C) 500 MG tablet Take 1 tablet (500 mg total) by mouth daily.      busPIRone (BUSPAR) 15 MG tablet TAKE 1 TABLET (15 MG TOTAL) BY MOUTH THREE (3) TIMES A DAY. 270 tablet 3    ferrous sulfate 325 (65 FE) MG tablet Take 1 tablet (325 mg total) by mouth in the morning.      fluocinonide (LIDEX) 0.05 % external solution Apply topically two (2) times a day. To scalp 60 mL 0    HUMIRA PEN CITRATE FREE 40 MG/0.4 ML Inject the contents of 1 pen (40 mg total) under the skin every seven (7) days. 4 each 3    levETIRAcetam (KEPPRA) 750 MG tablet Take 2 tablets (1,500 mg total) by mouth two (2) times a day. 360 tablet 3    methocarbamol (ROBAXIN) 500 MG tablet TAKE 1 TABLET (500 MG TOTAL) BY MOUTH TWO (2) TIMES A DAY AS NEEDED (PAIN). 90 tablet 1    midazolam 5 mg/spray (0.1 mL) Spry Give 1 spray (5mg ) into 1 nostril. If no response in 10 min, may give second spray in other nostril. Do not give second dose if patient has trouble breathing or excessive sedation. Max 2 doses/seizure episode, 1 episode every 3 days or 5 episodes/month. 2 each 4    oxybutynin (DITROPAN) 5 MG tablet Take 1 tablet (5 mg total) by mouth Three (3) times a day. (Patient taking differently: Take 1 tablet (5 mg total) by mouth in the morning.) 270 tablet 3    tamsulosin (FLOMAX) 0.4 mg capsule Take 1 capsule (0.4 mg total) by mouth daily. (Patient taking differently: Take 1 capsule (0.4 mg total) by mouth daily. Evening) 90 capsule 3    triamcinolone (KENALOG) 0.1 % ointment Apply topically two (2) times a day. To irritated areas on body 80 g 2     No current facility-administered medications for this visit.        Changes to medications: Adante reports no changes at this time.    Allergies   Allergen Reactions    Infliximab      Seizure activity  Other reaction(s): Other (See Comments)  Seizures       Changes to allergies: No    SPECIALTY MEDICATION ADHERENCE     Humira 40  mg/0.48mL : 18 days of medicine on hand   Medication Adherence    Patient reported X missed doses in the last month: 0  Specialty Medication: Humira          Specialty medication(s) dose(s) confirmed: Regimen is correct and unchanged.     Are there any concerns with adherence? No    Adherence counseling provided? Not needed    CLINICAL MANAGEMENT AND INTERVENTION      Clinical Benefit Assessment:    Do you feel the medicine is effective  or helping your condition? Yes    Clinical Benefit counseling provided? Not needed    Adverse Effects Assessment:    Are you experiencing any side effects? No    Are you experiencing difficulty administering your medicine? No    Quality of Life Assessment:    Quality of Life    Rheumatology  Oncology  Dermatology  Cystic Fibrosis          How many days over the past month did your CD  keep you from your normal activities? For example, brushing your teeth or getting up in the morning. 0    Have you discussed this with your provider? Not needed    Acute Infection Status:    Acute infections noted within Epic:  No active infections  Patient reported infection: None    Therapy Appropriateness:    Is therapy appropriate and patient progressing towards therapeutic goals? Yes, therapy is appropriate and should be continued    DISEASE/MEDICATION-SPECIFIC INFORMATION      For patients on injectable medications: Patient currently has 2 doses left.  Next injection is scheduled for 5/5 & 5/12.    Chronic Inflammatory Diseases: Have you experienced any flares in the last month? No  Has this been reported to your provider? Not applicable    PATIENT SPECIFIC NEEDS     Does the patient have any physical, cognitive, or cultural barriers? No    Is the patient high risk? No    Did the patient require a clinical intervention? No    Does the patient require physician intervention or other additional services (i.e., nutrition, smoking cessation, social work)? No    SOCIAL DETERMINANTS OF HEALTH     At the [facility], we have learned that life circumstances - like trouble affording food, housing, utilities, or transportation can affect the health of many of our patients.   That is why we wanted to ask: are you currently experiencing any life circumstances that are negatively impacting your health and/or quality of life? No    Social Determinants of Health     Financial Resource Strain: Low Risk  (06/17/2021)    Overall Financial Resource Strain (CARDIA)     Difficulty of Paying Living Expenses: Not hard at all   Internet Connectivity: Not on file   Food Insecurity: No Food Insecurity (06/17/2021)    Hunger Vital Sign     Worried About Running Out of Food in the Last Year: Never true     Ran Out of Food in the Last Year: Never true   Tobacco Use: Low Risk  (08/25/2022)    Patient History     Smoking Tobacco Use: Never     Smokeless Tobacco Use: Never     Passive Exposure: Not on file   Housing/Utilities: Low Risk  (06/17/2021)    Housing/Utilities     Within the past 12 months, have you ever stayed: outside, in a car, in a tent, in an overnight shelter, or temporarily in someone else's home (i.e. couch-surfing)?: No     Are you worried about losing your housing?: No     Within the past 12 months, have you been unable to get utilities (heat, electricity) when it was really needed?: No   Alcohol Use: Not on file   Transportation Needs: No Transportation Needs (08/05/2021)    PRAPARE - Transportation     Lack of Transportation (Medical): No     Lack of Transportation (Non-Medical): No   Recent Concern: Transportation Needs -  Unmet Transportation Needs (06/01/2021)    PRAPARE - Therapist, art (Medical): Yes     Lack of Transportation (Non-Medical): Yes   Substance Use: Not on file   Health Literacy: Low Risk  (08/05/2021)    Health Literacy     : Never   Recent Concern: Health Literacy - Medium Risk (07/17/2021)    Health Literacy     : Rarely   Physical Activity: Not on file   Interpersonal Safety: Not on file   Stress: Not on file   Intimate Partner Violence: Not on file   Depression: Not at risk (08/23/2021)    PHQ-2     PHQ-2 Score: 2   Social Connections: Not on file       Would you be willing to receive help with any of the needs that you have identified today? Not applicable       SHIPPING     Specialty Medication(s) to be Shipped:   Inflammatory Disorders: Humira    Other medication(s) to be shipped: No additional medications requested for fill at this time     Changes to insurance: No    Delivery Scheduled: Yes, Expected medication delivery date: 5/7.     Medication will be delivered via Same Day Courier to the confirmed prescription address in [facility].    The patient will receive a drug information handout for each medication shipped and additional FDA Medication Guides as required.  Verified that patient has previously received a Conservation officer, historic buildings and a Surveyor, mining.    The patient or caregiver noted above participated in the development of this care plan and knows that they can request review of or adjustments to the care plan at any time.      All of the patient's questions and concerns have been addressed.    Teofilo Pod, PharmD   [facility] Specialty Pharmacist

## 2022-11-15 MED FILL — HUMIRA PEN CITRATE FREE 40 MG/0.4 ML: SUBCUTANEOUS | 28 days supply | Qty: 4 | Fill #2

## 2022-11-28 DIAGNOSIS — K50819 Crohn's disease of both small and large intestine with unspecified complications: Principal | ICD-10-CM

## 2022-11-28 MED ORDER — AZATHIOPRINE 50 MG TABLET
ORAL_TABLET | ORAL | 0 refills | 0 days | Status: CP
Start: 2022-11-28 — End: ?

## 2022-11-28 NOTE — Unmapped (Signed)
Refill request for azathioprine sent to the pharmacy for 1 month supply. Pt due for labs, lab reminder sent. Pt has appt scheduled with Dr. Raphael Gibney on 12/27/22. Standing labs inputted for [facility].

## 2022-12-13 NOTE — Unmapped (Signed)
[facility] Refill Coordination Note    Specialty Medication(s) to be Shipped:   Inflammatory Disorders: Humira    Other medication(s) to be shipped: No additional medications requested for fill at this time     Kerry Mooney, DOB: [birth year]  Phone: [phone] (home) [phone] (work)      All above HIPAA information was verified with patient's family member, Mother.     Was a Nurse, learning disability used for this call? No    Completed refill call assessment today to schedule patient's medication shipment from the [facility] [phone]).  All relevant notes have been reviewed.     Specialty medication(s) and dose(s) confirmed: Regimen is correct and unchanged.   Changes to medications: Quantavis reports no changes at this time.  Changes to insurance: No  New side effects reported not previously addressed with a pharmacist or physician: None reported  Questions for the pharmacist: No    Confirmed patient received a Conservation officer, historic buildings and a Surveyor, mining with first shipment. The patient will receive a drug information handout for each medication shipped and additional FDA Medication Guides as required.       DISEASE/MEDICATION-SPECIFIC INFORMATION        For patients on injectable medications: Patient currently has 0 doses left.  Next injection is scheduled for not sure.    SPECIALTY MEDICATION ADHERENCE     Medication Adherence    Patient reported X missed doses in the last month: 0  Specialty Medication: HUMIRA(CF) PEN 40 mg/0.4 mL  Patient is on additional specialty medications: No              Were doses missed due to medication being on hold? No    Humira 40/0.4 mg/ml: 0 days of medicine on hand        REFERRAL TO PHARMACIST     Referral to the pharmacist: Not needed      [facility]     Shipping address confirmed in Epic.       Delivery Scheduled: Yes, Expected medication delivery date: 12/15/22.     Medication will be delivered via Same Day Courier to the prescription address in Epic WAM.    Willette Pa   [facility] Specialty Technician

## 2022-12-15 MED FILL — HUMIRA PEN CITRATE FREE 40 MG/0.4 ML: SUBCUTANEOUS | 28 days supply | Qty: 4 | Fill #3

## 2022-12-23 MED ORDER — FLUOCINONIDE 0.05 % TOPICAL SOLUTION
Freq: Two times a day (BID) | TOPICAL | 0 refills | 0 days | Status: CP
Start: 2022-12-23 — End: 2023-12-23

## 2022-12-23 MED ORDER — AZATHIOPRINE 50 MG TABLET
ORAL_TABLET | ORAL | 1 refills | 0 days
Start: 2022-12-23 — End: ?
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
## 2022-12-27 ENCOUNTER — Ambulatory Visit: Admit: 2022-12-27 | Discharge: 2022-12-28 | Payer: PRIVATE HEALTH INSURANCE

## 2022-12-27 DIAGNOSIS — Z79899 Other long term (current) drug therapy: Principal | ICD-10-CM

## 2022-12-27 DIAGNOSIS — K50819 Crohn's disease of both small and large intestine with unspecified complications: Principal | ICD-10-CM

## 2022-12-27 DIAGNOSIS — M459 Ankylosing spondylitis of unspecified sites in spine: Principal | ICD-10-CM

## 2022-12-27 LAB — CBC
HEMATOCRIT: 40.7 % (ref 39.0–48.0)
HEMOGLOBIN: 13.7 g/dL (ref 12.9–16.5)
MEAN CORPUSCULAR HEMOGLOBIN CONC: 33.7 g/dL (ref 32.0–36.0)
MEAN CORPUSCULAR HEMOGLOBIN: 32.9 pg — ABNORMAL HIGH (ref 25.9–32.4)
MEAN CORPUSCULAR VOLUME: 97.7 fL — ABNORMAL HIGH (ref 77.6–95.7)
MEAN PLATELET VOLUME: 7.1 fL (ref 6.8–10.7)
PLATELET COUNT: 224 10*9/L (ref 150–450)
RED BLOOD CELL COUNT: 4.17 10*12/L — ABNORMAL LOW (ref 4.26–5.60)
RED CELL DISTRIBUTION WIDTH: 16 % — ABNORMAL HIGH (ref 12.2–15.2)
WBC ADJUSTED: 10.5 10*9/L (ref 3.6–11.2)

## 2022-12-27 LAB — IRON & TIBC
IRON SATURATION: 11 % — ABNORMAL LOW (ref 20–55)
IRON: 27 ug/dL — ABNORMAL LOW
TOTAL IRON BINDING CAPACITY: 255 ug/dL (ref 250–425)

## 2022-12-27 LAB — C-REACTIVE PROTEIN: C-REACTIVE PROTEIN: 21 mg/L — ABNORMAL HIGH (ref <=10.0)

## 2022-12-27 LAB — COMPREHENSIVE METABOLIC PANEL
ALBUMIN: 4.2 g/dL (ref 3.4–5.0)
ALKALINE PHOSPHATASE: 68 U/L (ref 46–116)
ALT (SGPT): 8 U/L — ABNORMAL LOW (ref 10–49)
ANION GAP: 5 mmol/L (ref 5–14)
AST (SGOT): 20 U/L (ref <=34)
BILIRUBIN TOTAL: 0.6 mg/dL (ref 0.3–1.2)
BLOOD UREA NITROGEN: 15 mg/dL (ref 9–23)
BUN / CREAT RATIO: 20
CALCIUM: 9.5 mg/dL (ref 8.7–10.4)
CHLORIDE: 107 mmol/L (ref 98–107)
CO2: 29.1 mmol/L (ref 20.0–31.0)
CREATININE: 0.76 mg/dL
EGFR CKD-EPI (2021) MALE: >90 mL/min/{1.73_m2} (ref >=60)
GLUCOSE RANDOM: 95 mg/dL (ref 70–179)
POTASSIUM: 4.2 mmol/L (ref 3.4–4.8)
PROTEIN TOTAL: 7.9 g/dL (ref 5.7–8.2)
SODIUM: 141 mmol/L (ref 135–145)

## 2022-12-27 LAB — VITAMIN B12: VITAMIN B-12: 436 pg/mL (ref 211–911)

## 2022-12-27 LAB — FERRITIN: FERRITIN: 96.6 ng/mL

## 2022-12-27 NOTE — Unmapped (Signed)
[facility] at [facility]  Phone: [phone]         Thank you for coming in today, it was a pleasure to see you and it is an honor to help take care of you.     After Visit Instructions:  -- labs today  -- continue humira and azathioprine  - stool based test today  -colonoscopy to reassess your disease today    -- I have placed an order in your record to get you scheduled for a colonoscopy. Call [phone], Option 2 in order to get your procedure scheduled.     -- I will see you back in clinic in 6 months. If you need to contact me, you may either send me a MyChart message or call the office and ask to speak with Melissa Noon (the nurse that works with me). MyChart messages are for non-urgent questions and may take up to 5 days to receive a response.     -------------------------------------------------------------------------------------------------------  Thank you for visiting our Gastroenterology Clinic!    Important phone numbers:     GI Clinic and Nutrition Appointments:  [phone] option 1 for appointments, then option 1 for clinic appointments.  Please call the GI Clinic appointment line if you need to schedule, reschedule, or cancel an appointment in clinic. They can also answer any questions you may have about where your appointment is located and when you need to arrive.       GI Procedure Appointments:  [phone] option 1 for appointments, then option 2 for procedure appointments.  Please call the GI Procedures line if you need to schedule, reschedule, or cancel any type of GI procedure (endoscopy, colonoscopy, motility testing, etc).  You can also call this number for prep instructions, etc.       Radiology: [phone]  If you are being scheduled for any type of radiology study, you will need to call to receive your appointment time.  Please call this number for information.        TEST RESULTS  If you have a MyChart account, your new results will be sent to you through your MyChart account at [email or web address]. For results that require follow-up, a member of your healthcare team will also contact you directly.    PRESCRIPTION REFILL REQUESTS  To request prescription refills, please contact your pharmacy or send your healthcare team a message through your MyChart account at [email or web address]  RECORD REQUESTS  For questions related to medical records, please call Medical Records Release of Information at [phone]  Georgena Spurling  For billing and other financial questions/needs - please contact [phone]. If you need to leave a message, please be sure to leave your full name, date of birth or MR#, best call back # and reason for call.    Sincerely,  Raynelle Highland. Shana Chute, MD  Division of Gastroenterology and Hepatology

## 2022-12-28 ENCOUNTER — Ambulatory Visit: Admit: 2022-12-28 | Discharge: 2022-12-29 | Payer: PRIVATE HEALTH INSURANCE

## 2022-12-28 DIAGNOSIS — G40909 Epilepsy, unspecified, not intractable, without status epilepticus: Principal | ICD-10-CM

## 2022-12-28 LAB — KEPPRA (LEVETIRACETAM): KEPPRA: 40.7 ug/mL (ref 6.0–46.0)

## 2022-12-28 MED ORDER — LEVETIRACETAM 750 MG TABLET
ORAL_TABLET | Freq: Two times a day (BID) | ORAL | 3 refills | 90 days | Status: CP
Start: 2022-12-28 — End: 2023-12-28

## 2022-12-28 NOTE — Unmapped (Signed)
[address], [address]  [place], [place] [address]   Phone: [phone]  Referral Fax: [phone]     Date December 28, 2022 11:06   Date Last seen: May 10, 2022   Date first seen: December 14, 2021  Name Kerry Mooney  MRN [id number]  Primary care provider Criselda Peaches Lidia Collum, MD  Referring Provider Claudean Kinds    Assessment:   Kerry Mooney is a 42 y.o. right handed man  who returns for the management of epilepsy. The patient has a history of childhood seizures treated with phenobarbital from 19 months for age for 2 years. He had not further seizures until March 2016.  He reports hitting his head prior to seizure recurrence in October 2015. He then progressed to having 3-4 seizures. His last seizure was on 05/09/2022 after which Keppra was increased to 1500 mg BID with no further seizures or side effects.seizure sound epileptic. A routine EEG (08/2022) showed left temporal transients F7> T 3.  Mood is doing well, with PHQ 1 and GAD 10- was 2.  Will continue medication as is and obtain levels of Keppra.     Diagnosis:   Epilepsy,     Plan:   Continue  Keppra 1500 mg twice a day   Levels of Keppra   I recommended seizure precautions with regards to avoiding unsupervised water recreational activity, climbing or working at heights, operation of heavy or dangerous machinery, caution around fire and sources of high heat, as well as any other activity which could put you at danger in case of a seizure. Taking a bath is generally not recommended for a patient with uncontrolled epilepsy. I also reviewed the [place] DMV law and recommended to  Follow up in 9 -12 months with [name]     I personally spent  24 minutes face-to-face and non-face-to-face in the care of this patient, which includes all pre, intra, and post visit time on the date of service.  All documented time was specific to the E/M visit and does not include any procedures that may have been performed. Extra time was needed as patient and parent were giving varying reports, hence separated them for some independent input.      Chief Complaint    Since last visit on May 10, 2022   Long term  AMS  Keppra 1500 mg BID  Side effects:    Not at this time, mood has improved significantly   Rescue [facility]SM  Nayzilam  Device  No   Seizures     Epilepsy Safety    He knows the seizure safety precautions and the [place] state mandatory 6 month driving restriction following seizures that could impair driving were discussed.    New results: Blood, EEG, MRI   EEG : Routine with left temporal transients done in 08/2022  Imaging : No new   Blood: Blood work at PCP seen, no Keppra level   Bone health   Vitamin D : No new   Dexa scan : None   Mood:   Mood description: Good     Social concerns: Managing to step away from stressors     Events between  12/14/2021 and May 10, 2022   ASMS Keppra 1000 mg BID   Side effects : No  Device:No  Seizures: Had a small seizure yesterday, (10/30)  felt ike in daze , had some sleep interruption on Saturday, not observed but confused.   Seizure precautions:Driving- No  New results: Blood, EEG, MRI   Ref Range & Units 12/14/21 1516 03/12/19 1109   Levetiracetam Level  6.0 - 46.0 ug/mL 21.9 12.2 R, CM     Brain MRi 03/03/2022: Unrevealing  Bone health - NO  Mood: . Says [place] stable.   Active health concerns        Seizure history  Child hood febrile seizures from 75 months of age, that might have been complex as he was on phenobarbital for 2 years. No seizures after that       In 04/2014, patient did have C2 dens fracture after slipping down the stairs at home when preparing for work.    On 10/06/2014: The  patient has chron's disease and while at [facility] for first dose of Remicade, he was noted to be unresponsive, undergoing grand mal seizure (unclear duration, likely <5 min), per notes. The patient had vomited on himself and was incontinent of urine. He was noted to have post-ictal confusion and sore, otherwise he feels back to baseline. This was believed to be a provoked seizures     11/05/2014- ER   (Pt was in his usual state of health when he went to bed last night, though was extremely tired (exhausted). He slept later than usual and did not get up during the night to go to the bathroom.  When he did wake up, he rushed to the bathroom because of the urgency, ended up running into the door hitting his head and causing a laceration over his eye. When he turned the lights on in the bathroom, he discovered that he had been incontinent (a trail of diarrhea from the bed to the bathroom). He was unaware of this happening, and even still the whole event remains a bit fuzzy. He has a lingering albeit mild headache all over. He did feel a bit nauseated during the clean-up process, but denied any fevers, pain, blood in stool or any frank LOC): Attributed to GI not seizure.     12/09/2014 -ER When he got home, his mother witnessed a generalized tonic-clonic seizure which lasted less than 5 minutes. He has had postictal state. He sustained no trauma or injury from the seizure. He's not been ill recently with fever, or other systemic symptoms. His Crohn's doctor is switching him to Humira.  Started on Keppra 500 mg BID then per  [facility] for this and  told to continue the medication for a few years. He has not had any recurrent seizures since. 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    07/12/2020- ER Large Grand mal seizure seen on ER at [facility] and given 2 g of Keppra with a plan to increase Keppra to 750 mg BID. The patient reports that he however had been taking 500 mg BID .     11/09/2020: Another self reported seizure, no medical notes.   In 04/2021: patient did have C2 dens fracture after slipping down the stairs at home when preparing for work and lumbar. The parents think this was due to a seizure, again no medical notes to support this.     07/2021: Has a seizures, reports going to the ER and then in March 2023,  Keppra was increased to 1000 mg BID and no seizures since then.       History of Present Illness  The patient is accompanied by his father, mother called to supplement history . History details were challenging.      Seizure risk  factors: The was the product of an uncomplicated pregnancy and delivery.  The patient had a normal development.There a history febrile seizure as an infant or child with phenobarbital used from 19 months for a total of 2 years.  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  There is no family history of seizures or epilepsy. Positive head injury.  Scoliosis.     Onset: The patient had his first seizure as an infant believed to be febrile and encephalitis related.    Number of seizure types: One  Seizure/seizure-like event  description:   - Aura: No clear warning,   - Semiology: Makes a sound, then whole body shaking, eyes open, drool, bites tongue and unclear UI. This lasts about 2-3 minutes, has a post ictal period long time.   Frequency: As a child 19 months, on meds for 2 years, no seizures until 09/2014. Then sporadic 10/2014, 11/2014, 07/2020, 11/2020, 04/2021, 07/2021, and most recently  05/09/2022  Provoking Features: stress not eating.   Work up done   EEG:   vEEG/ EMU evaluation: No   Brain MRI imaging: in 2016 was normal.   Brain MRI imaging 03/03/2022 was normal.     He has been  PB for 2 years (19 months for 2 years),  and no seizures  LEV 1000 mg BID with no side effect from seizure medication.    Admission for status epilepticus: No   Admission for frequent seizures: No  Injuries during seizures or attributed to seizures: Conflicting reports, none per initial evaluation, father states today fracture was from seizure, notes from 10/22 show a fall  (with no clear documentation of seizure preceding this).     Past Medical History: He  has a past medical history of Anemia, Crohn's colitis (CMS-HCC), and Spondylolysis.     Past Surgical History:    Past Surgical History:   Procedure Laterality Date    HERNIA REPAIR      NECK SURGERY  2016    spinal fusion.has screw in his spine    PR COLONOSCOPY W/BIOPSY SINGLE/MULTIPLE  09/15/2014    Procedure: COLONOSCOPY, FLEXIBLE, PROXIMAL TO SPLENIC FLEXURE; WITH BIOPSY, SINGLE OR MULTIPLE;  Surgeon: Zetta Bills, MD;  Location: GI PROCEDURES [place] [facility];  Service: Gastroenterology    PR COLONOSCOPY W/BIOPSY SINGLE/MULTIPLE N/A 10/06/2016    Procedure: COLONOSCOPY, FLEXIBLE, PROXIMAL TO SPLENIC FLEXURE; WITH BIOPSY, SINGLE OR MULTIPLE;  Surgeon: Zetta Bills, MD;  Location: GI PROCEDURES [place] [facility];  Service: Gastroenterology    PR COLONOSCOPY W/BIOPSY SINGLE/MULTIPLE N/A 01/04/2021    Procedure: COLONOSCOPY, FLEXIBLE, PROXIMAL TO SPLENIC FLEXURE; WITH BIOPSY, SINGLE OR MULTIPLE;  Surgeon: Zetta Bills, MD;  Location: GI PROCEDURES [place] [facility];  Service: Gastroenterology    PR DEBRIDEMENT MUSCLE &/FASCIA 1ST 20 SQ CM/< Midline 06/18/2021    Procedure: DEBRIDEMENT; SKIN, SUBCUTANEOUS TISSUE, & MUSCLE;  Surgeon: Clarene Duke, MD;  Location: MAIN OR [place];  Service: Plastics    PR DEBRIDEMENT SUBCUTANEOUS TISSUE 1ST 20 SQ CM/< Midline 07/19/2021    Procedure: DEBRIDEMENT; SKIN & SUBCUTANEOUS TISSUE TRUNK;  Surgeon: Clarene Duke, MD;  Location: MAIN OR [place];  Service: Plastics    PR INCISION & DRAINAGE COMPLEX PO WOUND INFECTION Midline 05/20/2021    Procedure: INCISION & DRAINAGE, COMPLEX, POSTOPERATIVE WOUND INFECTION BACK/PRONE;  Surgeon: Seth Bake, MD;  Location: MAIN OR [place];  Service: Neurosurgery    PR IONM 1 ON 1 IN OR W/ATTENDANCE EACH 15  MINUTES N/A 04/25/2014    Procedure: CONTINUOUS INTRAOPERATIVE NEUROPHYSIOLOGY MONITORING IN OR;  Surgeon: Everlean Alstrom, MD;  Location: MAIN OR [facility];  Service: Neurosurgery    PR MUSCLE-SKIN FLAP,TRUNK Midline 05/24/2021    Procedure: MUSCLE, MYOCUTANEOUS, OR FASCIOCUTANEOUS FLAP; TRUNK; Surgeon: Clarene Duke, MD;  Location: MAIN OR [place];  Service: Plastics    PR ODONTOID,FX,GRAFTING,OPEN Midline 04/25/2014    Procedure: OPEN TREATMENT &/OR REDCUCTION ODONTOID FX/DISLOCATION, ANTERIOR APPROACH, INTERNAL FIXATION, WITH GRAFT;  Surgeon: Everlean Alstrom, MD;  Location: MAIN OR [place];  Service: Neurosurgery    PR SURG DIAGNOSTIC EXAM, ANORECTAL N/A 01/21/2021    Procedure: ANORECTAL EXAM, SURGICAL, REQUIRING ANESTHESIA (GENERAL, SPINAL, OR EPIDURAL), DIAGNOSTIC;  Surgeon: Sharyn Lull, MD;  Location: MAIN OR [place];  Service: Gastrointestinal    PR SURG EXCISION OF ANAL LESION(S) N/A 01/21/2021    Procedure: DESTRUCTION OF LESION(S), ANUS (EG, CONDYLOMA, PAPILLOMA, MOLLUSCUM CONTAGIOSUM) SIMPLE; SURGICAL EXCISION;  Surgeon: Sharyn Lull, MD;  Location: MAIN OR [place];  Service: Gastrointestinal    TONSILLECTOMY         Family History: His family history includes Autoimmune disease in his maternal grandmother; Polycystic kidney disease in his sister.     Social history: He  reports that he has never smoked. He has never used smokeless tobacco. He reports that he does not currently use alcohol. He reports that he does not use drugs.      Medications:       Current Outpatient Medications:     ascorbic acid, vitamin C, (VITAMIN C) 500 MG tablet, Take 1 tablet (500 mg total) by mouth daily., Disp: , Rfl:     azathioprine (IMURAN) 50 mg tablet, TAKE 3 TABLETS BY MOUTH EVERY DAY, Disp: 90 tablet, Rfl: 0    busPIRone (BUSPAR) 15 MG tablet, TAKE 1 TABLET (15 MG TOTAL) BY MOUTH THREE (3) TIMES A DAY., Disp: 270 tablet, Rfl: 3    ferrous sulfate 325 (65 FE) MG tablet, Take 1 tablet (325 mg total) by mouth in the morning., Disp: , Rfl:     fluocinonide (LIDEX) 0.05 % external solution, APPLY TOPICALLY TWO (2) TIMES A DAY. TO SCALP, Disp: 60 mL, Rfl: 0    HUMIRA PEN CITRATE FREE 40 MG/0.4 ML, Inject the contents of 1 pen (40 mg total) under the skin every seven (7) days., Disp: 4 each, Rfl: 3 levETIRAcetam (KEPPRA) 750 MG tablet, Take 2 tablets (1,500 mg total) by mouth two (2) times a day., Disp: 360 tablet, Rfl: 3    methocarbamol (ROBAXIN) 500 MG tablet, TAKE 1 TABLET (500 MG TOTAL) BY MOUTH TWO (2) TIMES A DAY AS NEEDED (PAIN)., Disp: 90 tablet, Rfl: 1    oxybutynin (DITROPAN) 5 MG tablet, Take 1 tablet (5 mg total) by mouth Three (3) times a day. (Patient taking differently: Take 1 tablet (5 mg total) by mouth in the morning.), Disp: 270 tablet, Rfl: 3    tamsulosin (FLOMAX) 0.4 mg capsule, Take 1 capsule (0.4 mg total) by mouth daily. (Patient taking differently: Take 1 capsule (0.4 mg total) by mouth daily. Evening), Disp: 90 capsule, Rfl: 3    triamcinolone (KENALOG) 0.1 % ointment, Apply topically two (2) times a day. To irritated areas on body, Disp: 80 g, Rfl: 2    midazolam 5 mg/spray (0.1 mL) Spry, Give 1 spray (5mg ) into 1 nostril. If no response in 10 min, may give second spray in other nostril. Do not give second dose if patient has trouble breathing or excessive sedation. Max  2 doses/seizure episode, 1 episode every 3 days or 5 episodes/month. (Patient not taking: Reported on 12/28/2022), Disp: 2 each, Rfl: 4     Allergies: He is allergic to infliximab.    Review of Systems:      Physical Examination:    Vitals:    12/28/22 1051   BP: 112/64   BP Site: L Arm   BP Position: Sitting   BP Cuff Size: Medium   Pulse: 67   Weight: 64.4 kg (142 lb)       HE ENT: Normocephalic, atraumatic. Hunched back     On neurologic exam, the patient was alert and oriented to person, place, and time. Memory:fair.  Language was clear and fluent with fair fund of  concentration. WORLD backwards, Cranial Nerves: Pupils equal, round, and reactive to light. Visual fields full. Extraocular movements intact. Sensation intact V 1 through V 3 bilaterally. Smile and brow raise were symmetric. Hearing intact to finger rub bilaterally. Tongue, uvula, and palate midline.      Motor: About 5- in the left  upper extremity otherwise, otherwise grossly normal today.  Sensation was intact to light touch throughout.   Coordination: Past pointing in the left hand    Reflexes:asymmetry in legs     Gait was  spondylotic gait but no use of devices which is much improved .     Other: No seizure activity noted during the exam.

## 2022-12-28 NOTE — Unmapped (Signed)
Continue  Keppra 1500 mg twice a day   Levels of Keppra   I recommended seizure precautions with regards to avoiding unsupervised water recreational activity, climbing or working at heights, operation of heavy or dangerous machinery, caution around fire and sources of high heat, as well as any other activity which could put you at danger in case of a seizure. Taking a bath is generally not recommended for a patient with uncontrolled epilepsy. I also reviewed the  DMV law and recommended to  Follow up in 9 -12 months with [name]

## 2022-12-29 IMAGING — DX DG LUMBAR SPINE 2-3V
2 series · 2 of 2 positions shown · non-contrast
Comparison: 04/11/2021, MRI 04/11/2021, CT 04/11/2021

CLINICAL DATA: Status post fusion

EXAM:
LUMBAR SPINE - 2-3 VIEW

[l-spine ap]
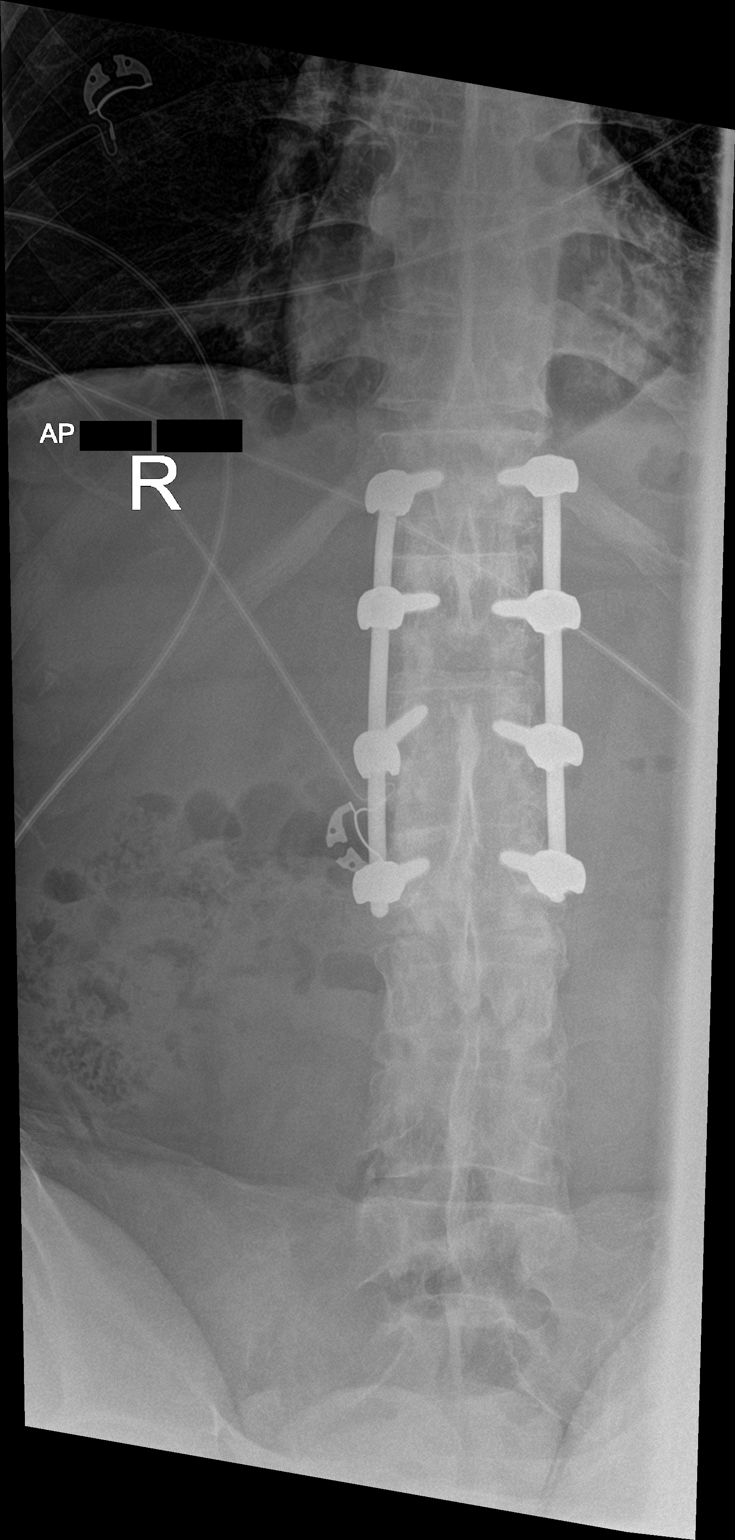

[l-spine lat]
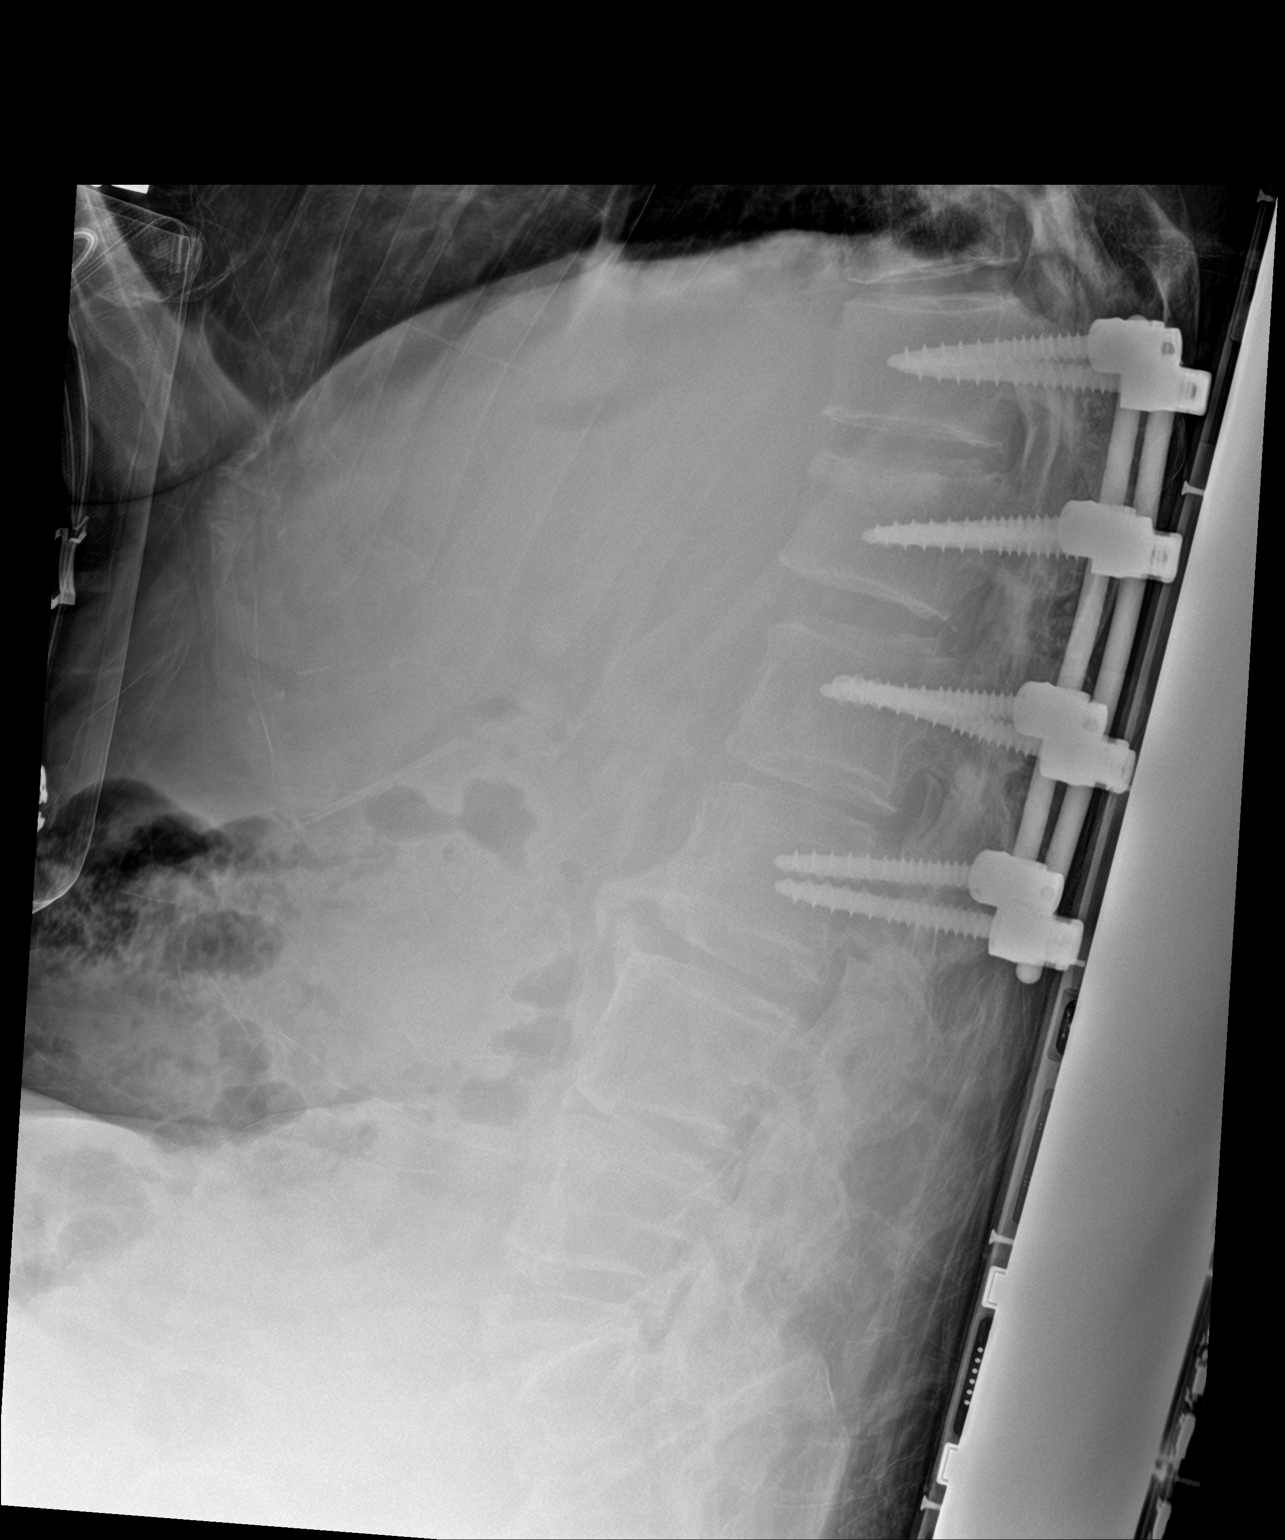

[2 of 2 positions shown; findings below may reference images not displayed]

FINDINGS: Interval posterior spinal fusion T12 through L3 with intact
hardware. More normal appearance of the disc space at L1-L2 compared
to prior CT. Mild superior endplate deformity at L1 with sclerosis
as before.
IMPRESSION: Interval posterior spinal fusion T12 through L3 with intact
appearing hardware and reduction at L1-L2.

## 2022-12-30 MED ORDER — TAMSULOSIN 0.4 MG CAPSULE
ORAL_CAPSULE | Freq: Every day | ORAL | 3 refills | 90 days | Status: CP
Start: 2022-12-30 — End: ?

## 2022-12-30 NOTE — Unmapped (Signed)
Patient is requesting the following refill  Requested Prescriptions     Pending Prescriptions Disp Refills    tamsulosin (FLOMAX) 0.4 mg capsule [Pharmacy Med Name: TAMSULOSIN HCL 0.4 MG CAPSULE] 90 capsule 3     Sig: TAKE 1 CAPSULE BY MOUTH EVERY DAY       Recent Visits  Date Type Provider Dept   08/25/22 Office Visit Claudean Kinds, MD [facility]   Showing recent visits within past 365 days with a meds authorizing provider and meeting all other requirements  Future Appointments  Date Type Provider Dept   09/01/23 Appointment Criselda Peaches Lidia Collum, MD [facility]   Showing future appointments within next 365 days with a meds authorizing provider and meeting all other requirements       Labs: Not applicable this refill

## 2023-01-02 MED ORDER — AZATHIOPRINE 50 MG TABLET
ORAL_TABLET | Freq: Every day | ORAL | 0 refills | 90 days | Status: CP
Start: 2023-01-02 — End: ?

## 2023-01-02 NOTE — Unmapped (Signed)
Refill request for azathioprine sent to the pharmacy for 3 month supply. Pt up to date on appts. Labs DOS 12/27/22 reviewed by Dr. Raphael Gibney.

## 2023-01-03 NOTE — Unmapped (Signed)
Colonoscopy  Procedure #1     Procedure #2   [id number]  MRN   JAIN   Endoscopist     Is the patient's health insurance [employer], [facility]), or [employer]?     Urgent procedure     Are you pregnant?     Are you in the process of scheduling or awaiting results of a heart ultrasound, stress test, or catheterization to evaluate new or worsening chest pain, dizziness, or shortness of breath?     Do you take: Plavix (clopidogrel), Coumadin (warfarin), Lovenox (enoxaparin), Pradaxa (dabigatran), Effient (prasugrel), Xarelto (rivaroxaban), Eliquis (apixaban), Pletal (cilostazol), or Brilinta (ticagrelor)?          Which of the above medications are you taking?          What is the name of the medical practice that manages this medication?          What is the name of the medical provider who manages this medication?     Do you have hemophilia, von Willebrand disease, or low platelets?     Do you have a pacemaker or implanted cardiac defibrillator?     Has a  GI provider specified the location(s)?     Which location(s) did the [facility] provider specify?        [facility]        [place]        [facility]        [facility]     Is procedure indication for variceal banding (this does NOT include variceal screening)?     Have you had a heart attack, stroke or heart stent placement within the past 6 months?     Month of event     Year of event (ONLY ENTER LAST 2 DIGITS)        5  Height (feet)   10  Height (inches)   140  Weight (pounds)   20.1  BMI          Did the ordering provider specify a bowel prep?          What bowel prep was specified?     Do you have chronic kidney disease?     Do you have chronic constipation or have you had poor quality bowel preps for past colonoscopies?   TRUE  Do you have Crohn's disease or ulcerative colitis?     Have you had weight loss surgery?          When you walk around your house or grocery store, do you have to stop and rest due to shortness of breath, chest pain, or light-headedness?     Do you ever use supplemental oxygen?     Have you been hospitalized for cirrhosis of the liver or heart failure in the last 12 months?     Have you been treated for mouth or throat cancer with radiation or surgery?     Have you been told that it is difficult for doctors to insert a breathing tube in you during anesthesia?     Have you had a heart or lung transplant?          Are you on dialysis?     Do you have cirrhosis of the liver?     Do you have myasthenia gravis?     Is the patient a prisoner?          Have you been diagnosed with sleep apnea or do you wear  a CPAP machine at night?     Are you younger than 30?   TRUE  Have you previously received propofol sedation administered by an anesthesiologist for a GI procedure?     Do you drink an average of more than 3 drinks of alcohol per day?     Do you regularly take suboxone or any prescription medications for chronic pain?     Do you regularly take Ativan, Klonopin, Xanax, Valium, lorazepam, clonazepam, alprazolam, or diazepam?     Have you previously had difficulty with sedation during a GI procedure?     Have you been diagnosed with PTSD?     Are you allergic to fentanyl or midazolam (Versed)?     Do you take medications for HIV?   ################# ## ###################################################################################################################   MRN:          [id number]   Anticoag Review:  No   Nurse Triage:  No   GI Clinic Consult:  No   Procedure(s):  Colonoscopy     0   [facility]     [place]        Endoscopist:  JAIN    Urgent:            No   Prep:               Miralax Prep                  ################# ## ###################################################################################################################

## 2023-01-05 ENCOUNTER — Ambulatory Visit: Admit: 2023-01-05 | Discharge: 2023-01-06 | Payer: PRIVATE HEALTH INSURANCE

## 2023-01-05 ENCOUNTER — Encounter: Admit: 2023-01-05 | Discharge: 2023-01-06 | Payer: PRIVATE HEALTH INSURANCE

## 2023-01-05 ENCOUNTER — Ambulatory Visit
Admit: 2023-01-05 | Discharge: 2023-01-06 | Payer: PRIVATE HEALTH INSURANCE | Attending: Internal Medicine | Primary: Internal Medicine

## 2023-01-05 DIAGNOSIS — K50819 Crohn's disease of both small and large intestine with unspecified complications: Principal | ICD-10-CM

## 2023-01-05 DIAGNOSIS — M459 Ankylosing spondylitis of unspecified sites in spine: Principal | ICD-10-CM

## 2023-01-05 DIAGNOSIS — L409 Psoriasis, unspecified: Principal | ICD-10-CM

## 2023-01-05 MED ORDER — HUMIRA PEN CITRATE FREE 40 MG/0.4 ML
SUBCUTANEOUS | 5 refills | 28 days | Status: CP
Start: 2023-01-05 — End: ?
  Filled 2023-02-02: qty 4, 28d supply, fill #0

## 2023-01-05 NOTE — Unmapped (Signed)
Addended by: Dell Ponto on: 01/05/2023 03:06 PM     Modules accepted: Level of Service

## 2023-01-05 NOTE — Unmapped (Signed)
RHEUMATOLOGY NEW CLINIC NOTE    Assessment/Plan:  Kerry Mooney is a 42 y.o. male with PMH of seizure disorder, ankylosing spondylitis, and Crohn's disease. His ankylosing spondylitis and Crohn's disease were diagnosed in 2016 in the setting of weight loss and GI symptoms. History of 1-2 episodes of iritis. Did not tolerate infliximab due to seizure. He has been following with Dr. Raphael Gibney (GI) for his IBD. He has been on humira and azathioprine since 2016. He has complex MSK anatomy with history of fall from attic on 04/11/21 with C2-3 fracture and L1 chance fracture who underwent C2-4 and T12-L3 posterior decompression and fusion (10/2) c/b thoracolumbar wound infection and dehiscence s/p debridement, closure with bilateral trapezius flaps 11/14. C/b seroma, cx grew serratia and MRSA. Patient re-referred to rheumatology for joint management with GI of Crohn's disease and ankylosing spondylitis. The patient continues to experience weight loss (down 4 lbs since last seen). GI symptoms well controlled but last colonoscopy in 2020 with worsening patchy inflammation consistent with Crohn's disease from prior colonoscopy in 2018, next planned for September. He has not had any further episodes of iritis. 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 Interestingly, no antibodies to adalimumab are present.     Ankylosing Spondylitis - Due to concerns for inflammatory arthritis and TNF induced psoriasis, recommend adjusting therapy.   - Will send message to Dr. Raphael Gibney regarding concerns for need to change Humira. Will discuss transition to Stelara with Dr. Raphael Gibney.  - Continue azathioprine 150 mg daily.     IBD - Crohn's Disease, follows with Dr. Raphael Gibney (GI).   - Therapy as above.   - GI planning for repeat colonoscopy 03/2023.     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  - Recommend transition of therapy with discontinuation of humira and consideration of starting Stelara, will discuss with GI as above.     This patient was  seen and evaluated with Dr. Berton Lan and the plan was determined accordingly.     Cecilio Ohlrich, DO  Internal Medicine PGY-2    History of Present Illness:  The patient was seen in consultation at the request of Kerry Mooney  for the evaluation of ankylosing spondylitis and IBD    Primary Care Provider: Claudean Kinds, MD    [name] is a 42 y.o.  male last seen in this division in 2018. Has been taking azathioprine and humira for years. Referred back from GI.     Hx of fall from attic on 04/11/21 with C2-3 fracture and L1 chance fracture who underwent C2-4 and T12-L3 posterior decompression and fusion (10/2) c/b thoracolumbar wound infection and dehiscence s/p debridement, closure with bilateral trapezius flaps 11/14. C/b seroma, cx grew serratia and MRSA.    On discussion with the patient today, he states that he was originally diagnosed with IBD and ankylosing spondylitis in 2016 after significant weight loss. Treatment was started in 2016. His symptoms throughout the course of his disease included weight loss, difficulty eating, abdominal pain and cramping, diarrhea. He has had back pain since he was in middle school. He has had iritis 1-2 times about 10 years ago which required steroid eye drops.     He has gone up on humira from once per month to 40 mg once every week over the course of the last year. He is taking 150 mg of azathioprine daily.  He adds that his hands are more sore than he was in the past. His back and legs are more sore than previously.     He states that he has had some rashes on his lower extremities. His PCP put him on creams. He is on a triamcinolone ointment and fluocinonide solution. He has noticed improvement with these ointments. He started to notice a rash on his lower extremities, upper extremities, and scalp. It is not painful. The rash is dry and itchy.     He states that his weight has stabilized. He experiences abdominal pain and cramping depending on what he eats and he states that this is stable. He does continue to have some loose stools. He has about 1 bowel movement per day. No blood in his stool. No eye pain, redness, vision changes, further episodes of iritis since his 30s. He continues to have joint pain. This is located in his back, shoulders, knees, hips, and bilateral ankles.   Pain is the worst in his lower back and his bilateral shoulders. He has morning stiffness which lasts about 30 minutes. His pain improves with activity. Rest worsens his pain. He has not noticed any swelling. He is able to complete all of his ADLs.       Allergies:  Infliximab    Medications:   Outpatient Medications Prior to Visit   Medication Sig Dispense Refill    ascorbic acid, vitamin C, (VITAMIN C) 500 MG tablet Take 1 tablet (500 mg total) by mouth daily.      azathioprine (IMURAN) 50 mg tablet Take 3 tablets (150 mg total) by mouth daily. 270 tablet 0    busPIRone (BUSPAR) 15 MG tablet TAKE 1 TABLET (15 MG TOTAL) BY MOUTH THREE (3) TIMES A DAY. 270 tablet 3    ferrous sulfate 325 (65 FE) MG tablet Take 1 tablet (325 mg total) by mouth in the morning.      fluocinonide (LIDEX) 0.05 % external solution APPLY TOPICALLY TWO (2) TIMES A DAY. TO SCALP 60 mL 0    HUMIRA PEN CITRATE FREE 40 MG/0.4 ML Inject the contents of 1 pen (40 mg total) under the skin every seven (7) days. 4 each 3    levETIRAcetam (KEPPRA) 750 MG tablet Take 2 tablets (1,500 mg total) by mouth two (2) times a day. 360 tablet 3    methocarbamol (ROBAXIN) 500 MG tablet TAKE 1 TABLET (500 MG TOTAL) BY MOUTH TWO (2) TIMES A DAY AS NEEDED (PAIN). 90 tablet 1    midazolam 5 mg/spray (0.1 mL) Spry Give 1 spray (5mg ) into 1 nostril. If no response in 10 min, may give second spray in other nostril. Do not give second dose if patient has trouble breathing or excessive sedation. Max 2 doses/seizure episode, 1 episode every 3 days or 5 episodes/month. 2 each 4    tamsulosin (FLOMAX) 0.4 mg capsule TAKE 1 CAPSULE BY MOUTH EVERY DAY 90 capsule 3    triamcinolone (KENALOG) 0.1 % ointment Apply topically two (2) times a day. To irritated areas on body 80 g 2    oxybutynin (DITROPAN) 5 MG tablet Take 1 tablet (5 mg total) by mouth Three (3) times a day. (Patient taking differently: Take 1 tablet (5 mg total) by mouth in the morning.) 270 tablet 3     No facility-administered medications prior to visit.       Medical History:  Past Medical History:   Diagnosis Date    Anemia  Crohn's colitis (CMS-HCC)     Spondylolysis     Ankylosing spondylitis       Surgical History:  Past Surgical History:   Procedure Laterality Date    HERNIA REPAIR      NECK SURGERY  2016    spinal fusion.has screw in his spine    PR COLONOSCOPY W/BIOPSY SINGLE/MULTIPLE  09/15/2014    Procedure: COLONOSCOPY, FLEXIBLE, PROXIMAL TO SPLENIC FLEXURE; WITH BIOPSY, SINGLE OR MULTIPLE;  Surgeon: Kerry Bills, MD;  [facility];  Service: Gastroenterology    PR COLONOSCOPY W/BIOPSY SINGLE/MULTIPLE N/A 10/06/2016    Procedure: COLONOSCOPY, FLEXIBLE, PROXIMAL TO SPLENIC FLEXURE; WITH BIOPSY, SINGLE OR MULTIPLE;  Surgeon: Kerry Bills, MD;  Location: [facility];  Service: Gastroenterology    PR COLONOSCOPY W/BIOPSY SINGLE/MULTIPLE N/A 01/04/2021    Procedure: COLONOSCOPY, FLEXIBLE, PROXIMAL TO SPLENIC FLEXURE; WITH BIOPSY, SINGLE OR MULTIPLE;  Surgeon: Kerry Bills, MD;  Location: [facility] PROC[facility];  Service: Gastroenterology    PR DEBRIDEMENT MUSCLE &/FASCIA 1ST 20 SQ CM/< Midline 06/18/2021    Procedure: DEBRIDEMENT; SKIN, SUBCUTANEOUS TISSUE, & MUSCLE;  Surgeon: Clarene Duke, MD;  Location: MAIN OR [place];  Service: Plastics    PR DEBRIDEMENT SUBCUTANEOUS TISSUE 1ST 20 SQ CM/< Midline 07/19/2021    Procedure: DEBRIDEMENT; SKIN & SUBCUTANEOUS TISSUE TRUNK;  Surgeon: Clarene Duke, MD;  Location: MAIN OR [place];  Service: Plastics    PR INCISION & DRAINAGE COMPLEX PO WOUND INFECTION Midline 05/20/2021    Procedure: INCISION & DRAINAGE, COMPLEX, POSTOPERATIVE WOUND INFECTION BACK/PRONE;  Surgeon: Seth Bake, MD;  Location: MAIN OR [place];  Service: Neurosurgery    PR IONM 1 ON 1 IN OR W/ATTENDANCE EACH 15 MINUTES N/A 04/25/2014    Procedure: CONTINUOUS INTRAOPERATIVE NEUROPHYSIOLOGY MONITORING IN OR;  Surgeon: Everlean Alstrom, MD;  Location: MAIN OR [place];  Service: Neurosurgery    PR MUSCLE-SKIN FLAP,TRUNK Midline 05/24/2021    Procedure: MUSCLE, MYOCUTANEOUS, OR FASCIOCUTANEOUS FLAP; TRUNK;  Surgeon: Clarene Duke, MD;  Location: MAIN OR [place];  Service: Plastics    PR ODONTOID,FX,GRAFTING,OPEN Midline 04/25/2014    Procedure: OPEN TREATMENT &/OR REDCUCTION ODONTOID FX/DISLOCATION, ANTERIOR APPROACH, INTERNAL FIXATION, WITH GRAFT;  Surgeon: Everlean Alstrom, MD;  Location: MAIN OR [place];  Service: Neurosurgery    PR SURG DIAGNOSTIC EXAM, ANORECTAL N/A 01/21/2021    Procedure: ANORECTAL EXAM, SURGICAL, REQUIRING ANESTHESIA (GENERAL, SPINAL, OR EPIDURAL), DIAGNOSTIC;  Surgeon: Sharyn Lull, MD;  Location: MAIN OR [place];  Service: Gastrointestinal    PR SURG EXCISION OF ANAL LESION(S) N/A 01/21/2021    Procedure: DESTRUCTION OF LESION(S), ANUS (EG, CONDYLOMA, PAPILLOMA, MOLLUSCUM CONTAGIOSUM) SIMPLE; SURGICAL EXCISION;  Surgeon: Sharyn Lull, MD;  Location: MAIN OR [place];  Service: Gastrointestinal    TONSILLECTOMY         Social History:  Social History     Tobacco Use    Smoking status: Never    Smokeless tobacco: Never   Vaping Use    Vaping status: Never Used   Substance Use Topics    Alcohol use: Not Currently     Comment: rare    Drug use: No   Occupation: He used to work as an Government social research officer for about 15 years. He does not currently work and is in the process of getting disability.       Family History:  Family History Problem Relation Age of Onset    Autoimmune disease Maternal Grandmother         Psoriasis  Polycystic kidney disease Sister     Anesthesia problems Neg Hx     Ulcerative colitis Neg Hx     Crohn's disease Neg Hx     Colorectal Cancer Neg Hx     Pancreatic cancer Neg Hx     Esophageal cancer Neg Hx        Review of Systems: Please see above in the HPI, the remainder of a 10-system review was unremarkable.    Review of outside records: I have reviewed available records through Epic. Pertinent results include:     01/04/2021 Colonoscopy:   The Simple Endoscopic Score for Crohn's Disease was determined based on        the endoscopic appearance of the mucosa in the following segments:       - Ileum: Findings include no ulcers present, no ulcerated surfaces, no        affected surfaces and no narrowings. Segment score: 0.       - Right Colon: Findings include aphthous ulcers less than 0.5 cm in        size, less than 10% ulcerated surfaces, 50-75% of surfaces affected and        a single narrowing that can be passed. Segment score: 5.       - Transverse Colon: Findings include large ulcers 0.5-2 cm in size, less        than 10% ulcerated surfaces, 50-75% of surfaces affected and no        narrowings. Segment score: 5.       - Left Colon: Findings include aphthous ulcers less than 0.5 cm in size,        less than 10% ulcerated surfaces, greater than 75% of surfaces affected        and a single narrowing that can be passed. Segment score: 6.       - Rectum: Findings include aphthous ulcers less than 0.5 cm in size,        less than 10% ulcerated surfaces, less than 50% of surfaces affected and        no narrowings. Segment score: 3.       - Total SES-CD aggregate score: 19.  Impression:    - Preparation of the colon was fair.                         - Condylomata.                         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 Biopsied.                         - Patchy moderate inflammation was found in the entire                          examined colon secondary to Crohn's disease with                          colonic involvement. The appearance was worse than                          2018 colonoscopy. Biopsied.  Objective   Vitals:    01/05/23 0832   BP: 128/65   BP Site: L Arm   BP Position: Sitting   BP Cuff Size: Small   Pulse: 61   Temp: 36.4 ??C (97.5 ??F)   TempSrc: Temporal   Weight: 62.8 kg (138 lb 6.4 oz)     Body mass index is 19.86 kg/m??.  Physical Exam    General:   Well appearing male in no acute distress, severe kyphosis   Eyes:   PERRL, EOMI, conjunctivae are clear.   ENT:   MMM. Oropharhynx without any erythema or exudate.   Neck:   Supple, very limited ROM of neck in all planes 2/2 cervical spinal fusion   Heme:  No cervical or supraclavicular lymphadenopathy   Cardiovascular:  RRR.  S1 and S2 normal, without any murmur, rub, or gallop.   Lungs:  Clear to auscultation bilaterally, without wheeze.  Good air movement. Normal work of breathing.   Skin:    Diffuse pink, patchy, scaly plaques throughout upper and lower extremities, most prominent and diffuse along back, scalp, and posterior ears.    Psychiatry:   Alert and oriented to person, place, and time    Abdomen:   Normoactive bowel sounds, abdomen soft, non-tender and not distended, no hepatosplenomegaly.   Extremities:   Warm and well perfused. No edema.    Musculo Skeletal:   No joint tenderness, deformity, effusions. Decreased range of motion in shoulder, more prominent on the right (chronic since surgeries). Full elbow, hip, knee, ankle, hands and feet. No evidence of active synovitis in the small joints of the hands or feet.   Neurological:  No gross neurologic deficits. 5/5 strength throughout with exception of RUE and RLE 4/5 (chronic since surgeries)

## 2023-01-05 NOTE — Unmapped (Signed)
Last visit:12/27/22    Next visit:    Labs:12/27/22    Medication refilled: Humira x12m

## 2023-01-13 NOTE — Unmapped (Signed)
The [facility] has made a second and final attempt to reach this patient to refill the following medication:HUMIRA(CF) PEN 40 mg/0.4 mL injection (adalimumab).      We have left voicemails on the following phone numbers: [phone], have been unable to leave messages on the following phone numbers: [phone], have sent a MyChart message, have sent a text message to the following phone numbers: [phone], and have sent a Mychart questionnaire..    Dates contacted: 01/05/23 & 01/03/23  Last scheduled delivery: 12/15/22    The patient may be at risk of non-compliance with this medication. The patient should call the [facility] at [phone]  Option 4, then Option 2: Dermatology, Gastroenterology, Rheumatology to refill medication.    Dorisann Frames   [facility] Specialty Technician

## 2023-01-27 NOTE — Unmapped (Signed)
[facility] Refill Coordination Note    Specialty Medication(s) to be Shipped:   Inflammatory Disorders: Humira    Other medication(s) to be shipped: No additional medications requested for fill at this time     Kerry Mooney, DOB: 1980/12/24  Phone: [phone] (home) [phone] (work)      All above HIPAA information was verified with patient.     Was a Nurse, learning disability used for this call? No    Completed refill call assessment today to schedule patient's medication shipment from the [facility] [phone]).  All relevant notes have been reviewed.     Specialty medication(s) and dose(s) confirmed: Regimen is correct and unchanged.   Changes to medications: Jaharri reports no changes at this time.  Changes to insurance: No  New side effects reported not previously addressed with a pharmacist or physician: Yes - Patient reports skin rash. Patient would not like to speak to the pharmacist today. Their provider is aware.  Questions for the pharmacist: No    Confirmed patient received a Conservation officer, historic buildings and a Surveyor, mining with first shipment. The patient will receive a drug information handout for each medication shipped and additional FDA Medication Guides as required.       DISEASE/MEDICATION-SPECIFIC INFORMATION        For patients on injectable medications: Patient currently has 2 doses left.  Next injection is scheduled for 01/27/2023.    SPECIALTY MEDICATION ADHERENCE     Medication Adherence    Patient reported X missed doses in the last month: 0  Specialty Medication: HUMIRA(CF) PEN 40 mg/0.4 mL injection (adalimumab)  Patient is on additional specialty medications: No              Were doses missed due to medication being on hold? No     HUMIRA(CF) PEN 40 mg/0.4 mL injection (adalimumab): 14 days of medicine on hand       Referral to the pharmacist: Not needed      [facility]     Shipping address confirmed in Epic.       Delivery Scheduled: Yes, Expected medication delivery date: 02/02/2023.     Medication will be delivered via Same Day Courier to the prescription address in Epic WAM.    Craige Cotta   [facility] Specialty Technician

## 2023-03-07 NOTE — Unmapped (Signed)
[facility] Refill Coordination Note    Specialty Medication(s) to be Shipped:   Inflammatory Disorders: Humira    Other medication(s) to be shipped: No additional medications requested for fill at this time     Kerry Mooney, DOB: 1980-12-12  Phone: [phone] (home) [phone] (work)      All above HIPAA information was verified with patient.     Was a Nurse, learning disability used for this call? No    Completed refill call assessment today to schedule patient's medication shipment from the [facility] [phone]).  All relevant notes have been reviewed.     Specialty medication(s) and dose(s) confirmed: Regimen is correct and unchanged.   Changes to medications: Kerry Mooney reports no changes at this time.  Changes to insurance: No  New side effects reported not previously addressed with a pharmacist or physician: None reported  Questions for the pharmacist: No    Confirmed patient received a Conservation officer, historic buildings and a Surveyor, mining with first shipment. The patient will receive a drug information handout for each medication shipped and additional FDA Medication Guides as required.       DISEASE/MEDICATION-SPECIFIC INFORMATION        For patients on injectable medications: Patient currently has 1 doses left.  Next injection is scheduled for next week.    SPECIALTY MEDICATION ADHERENCE     Medication Adherence    Patient reported X missed doses in the last month: 0  Specialty Medication: HUMIRA(CF) PEN 40 mg/0.4 mL  Patient is on additional specialty medications: No              Were doses missed due to medication being on hold? No    Humira 40/0.4 mg/ml: 1 doses of medicine on hand        REFERRAL TO PHARMACIST     Referral to the pharmacist: Not needed      [facility]     Shipping address confirmed in Epic.       Delivery Scheduled: Yes, Expected medication delivery date: 03/15/23.     Medication will be delivered via Same Day Courier to the prescription address in Epic WAM.    Willette Pa   [facility] Specialty Technician

## 2023-03-15 MED FILL — HUMIRA PEN CITRATE FREE 40 MG/0.4 ML: SUBCUTANEOUS | 28 days supply | Qty: 4 | Fill #1

## 2023-03-16 NOTE — Unmapped (Signed)
Spoke with patient regarding upcoming GI procedure on 03/20/23 @ 1030 at [facility].    Confirmed patient has prep instructions.    Review medications, vitamins, and supplements to hold prior to procedure.    Confirmed time of arrival and procedure site.    Reminded patient that they would need a driver to bring them and pick them up after the procedure.    Patient verbalized understanding of instructions.     Patient does not have questions at this time.

## 2023-03-20 ENCOUNTER — Encounter
Admit: 2023-03-20 | Discharge: 2023-03-20 | Payer: PRIVATE HEALTH INSURANCE | Attending: Anesthesiology | Primary: Anesthesiology

## 2023-03-20 ENCOUNTER — Ambulatory Visit: Admit: 2023-03-20 | Discharge: 2023-03-20 | Payer: PRIVATE HEALTH INSURANCE

## 2023-03-20 MED ADMIN — Propofol (DIPRIVAN) injection: INTRAVENOUS | @ 16:00:00 | Stop: 2023-03-20

## 2023-03-20 MED ADMIN — lactated Ringers infusion: 10 mL/h | INTRAVENOUS | @ 15:00:00 | Stop: 2023-03-20

## 2023-03-20 MED ADMIN — lidocaine (PF) (XYLOCAINE-MPF) 20 mg/mL (2 %) injection: INTRAVENOUS | @ 15:00:00 | Stop: 2023-03-20

## 2023-03-20 MED ADMIN — Propofol (DIPRIVAN) injection: INTRAVENOUS | @ 15:00:00 | Stop: 2023-03-20

## 2023-03-21 DIAGNOSIS — K50819 Crohn's disease of both small and large intestine with unspecified complications: Principal | ICD-10-CM

## 2023-03-21 MED ORDER — RINVOQ 45 MG TABLET,EXTENDED RELEASE
ORAL_TABLET | Freq: Every day | ORAL | 0 refills | 84 days | Status: CP
Start: 2023-03-21 — End: ?
  Filled 2023-04-11: qty 28, 28d supply, fill #0

## 2023-03-21 MED ORDER — UPADACITINIB ER 30 MG TABLET,EXTENDED RELEASE 24 HR
ORAL_TABLET | Freq: Every day | ORAL | 0 refills | 90 days | Status: CP
Start: 2023-03-21 — End: ?

## 2023-03-21 NOTE — Unmapped (Signed)
MEDICATION ORDER ENTRY DOCUMENTATION    The following medication(s) have been sent to pharmacy for insurance authorization:    Medication: Upadacitinib  Dose & Frequency: 45mg  PO Daily for 12 weeks, then 30mg  PO daily maintenance  Pharmacy: [facility]    TB Check: Yes 07/15/14, will have pt update  Hepatitis B Check: Yes 10/14/20  Lipid Panel: 08/25/22    Requesting Physician: Dr. Zetta Bills    Notes: Failure of Humira (active inflammation on scope 9/9). Pt signed up for complete pro. Messaged pt with update.

## 2023-04-03 MED ORDER — AZATHIOPRINE 50 MG TABLET
ORAL_TABLET | ORAL | 0 refills | 0 days | Status: CP
Start: 2023-04-03 — End: ?

## 2023-04-03 NOTE — Unmapped (Signed)
Encounter for refill request:  Last clinic visit: 12/27/2022  Colonoscopy: 03/20/23  Appointments which have been scheduled for you      Jun 27, 2023 2:00 PM  (Arrive by 1:45 PM)  RETURN IBD with Zetta Bills, MD  [facility] [place]  [facility]) [address]  [facility] [address]  [place] [address]  [phone]        Lab Results   Component Value Date    WBC 10.5 12/27/2022    RBC 4.17 (L) 12/27/2022    HGB 13.7 12/27/2022    HCT 40.7 12/27/2022    PLT 224 12/27/2022    ALT 8 (L) 12/27/2022    AST 20 12/27/2022    ALKPHOS 68 12/27/2022    CRP 21.0 (H) 12/27/2022    CREATININE 0.76 12/27/2022       Azathioprine refills authorized x 1 month, as pt will be transitioning to Rinvoq, once approved.

## 2023-04-03 NOTE — Unmapped (Signed)
lvm to r/s appt on 12/5 as provider will be OOC provided call back number [phone]

## 2023-04-03 NOTE — Unmapped (Signed)
[facility] Specialty Medication Onboarding    Specialty Medication: Rinvoq  Prior Authorization: Approved   Financial Assistance: No - copay  <$25  Final Copay/Day Supply: $0 / 28 days (LD) & 30 days (MD)    Insurance Restrictions: Yes - max 1 month supply     Notes to Pharmacist:   Credit Card on File: not applicable    The triage team has completed the benefits investigation and has determined that the patient is able to fill this medication at [facility]. Please contact the patient to complete the onboarding or follow up with the prescribing physician as needed.

## 2023-04-05 NOTE — Unmapped (Signed)
Patient was notified of operational disruptions. Patient opted to: schedule their refill with understanding of potential delay until 10/1 or later. This was facilitated by pharmacy staff     [facility]    Patient Onboarding/Medication Counseling    Kerry Mooney is a 42 y.o. male with Crohn's who I am counseling today on initiation of therapy.  I am speaking to the patient.    Was a Nurse, learning disability used for this call? No    Verified patient's date of birth / HIPAA.    Specialty medication(s) to be sent: Inflammatory Disorders: Rinvoq      Non-specialty medications/supplies to be sent: n/a      Medications not needed at this time: n/a         Rinvoq (upadacitinib)    Medication & Administration     Dosage:  Crohn's: Take 1 tablet (45mg ) by mouth once daily for 12 weeks, then take 1 tablet (30mg ) daily thereafter     Lab tests required prior to treatment initiation:  Tuberculosis: Tuberculosis screening resulted in a non-reactive Quantiferon TB Gold assay.(Completed: 07/15/2014; Awaiting updated labs)  Hepatitis B: Hepatitis B serology studies are complete and non-reactive.(Completed: 10/14/2020)   Absolute lymphocyte count: ALC greater than 500/mm3.(Completed: 07/22/2021)  Absolute neutrophil count: ANC greater than 1,000/mm3..(Completed: 07/22/2021)  Hemoglobin: Hemoglobin is greater than 8 g/dL.(Completed: 12/27/2022)  Liver function tests: Baseline ALT and AST were evaluated and are documented in the patient chart prior to treatment initiation.(Completed: 12/27/2022)  Pregnancy: Patient is male: screening not applicable.      Administration: Take tablets with or without food.  Swallow tablets whole; do not chew, break, or crush.    Adherence/Missed dose instructions:  Take a missed dose as soon as you think about it.  If it has been more than 8-12 hours since your normal dosing time, skip the missed dose and go back to your normal time the following day.  Never take 2 doses at the same time or extra doses in an attempt to 'catch up' after a missed dose.    Goals of Therapy     Achieve symptom remission  Slow disease progression  Protection of remaining articular structures  Maintenance of function  Maintenance of effective psychosocial functioning    Side Effects & Monitoring Parameters     Upset stomach  Signs of a common cold - minor sore throat, runny or stuffy nose, etc.    The following side effects should be reported to the provider:  Signs of a hypersensitivity reaction - rash; hives; itching; red, swollen, blistered, or peeling skin; wheezing; tightness in the chest or throat; difficulty breathing, swallowing, or talking; swelling of the mouth, face, lips, tongue, or throat; etc.  Reduced immune function - report signs of infection such as fever; chills; body aches; very bad sore throat; ear or sinus pain; cough; more sputum or change in color of sputum; pain with passing urine; wound that will not heal, etc.  Also at a slightly higher risk of some malignancies (mainly skin and blood cancers) due to this reduced immune function.  A new skin growth or lump  Stomach pain that is new or worse or change in bowel habits  Signs of a blood clot - chest pain or pressure; coughing up blood; shortness of breath; swelling, warmth, numbness, or pain in a leg or arm  Signs of MACE - stroke: trouble walking, speaking, and understanding, as well as paralysis or numbness of the face, arm, or leg; MI:  tightness or pain in the chest, neck, back, or arms, as well as fatigue, lightheadedness, abnormal heartbeat, and anxiety      Contraindications, Warnings, & Precautions     Have your bloodwork checked as you have been told by your prescriber  Talk with your doctor if you are pregnant, planning to become pregnant, or breastfeeding - women taking this medication must use birth control while taking this drug and for some time after the last dose  Discuss the possible need for holding your dose(s) of Rinvoq?? when a planned procedure is scheduled with the prescriber as it may delay healing/recovery timeline       Drug/Food Interactions     Medication list reviewed in Epic. The patient was instructed to inform the care team before taking any new medications or supplements. No drug interactions identified.   Talk with you prescriber or pharmacist before receiving any live vaccinations while taking this medication and after you stop taking it    Storage, Handling Precautions, & Disposal     Store in the original container at room temperature and protect from moisture.      Current Medications (including OTC/herbals), Comorbidities and Allergies     Current Outpatient Medications   Medication Sig Dispense Refill    ascorbic acid, vitamin C, (VITAMIN C) 500 MG tablet Take 1 tablet (500 mg total) by mouth daily.      azathioprine (IMURAN) 50 mg tablet TAKE 3 TABLETS BY MOUTH EVERY DAY 90 tablet 0    busPIRone (BUSPAR) 15 MG tablet TAKE 1 TABLET (15 MG TOTAL) BY MOUTH THREE (3) TIMES A DAY. 270 tablet 3    ferrous sulfate 325 (65 FE) MG tablet Take 1 tablet (325 mg total) by mouth in the morning.      fluocinonide (LIDEX) 0.05 % external solution APPLY TOPICALLY TWO (2) TIMES A DAY. TO SCALP 60 mL 0    HUMIRA PEN CITRATE FREE 40 MG/0.4 ML Inject the contents of 1 pen (40 mg total) under the skin every seven (7) days. 4 each 5    levETIRAcetam (KEPPRA) 750 MG tablet Take 2 tablets (1,500 mg total) by mouth two (2) times a day. 360 tablet 3    methocarbamol (ROBAXIN) 500 MG tablet TAKE 1 TABLET (500 MG TOTAL) BY MOUTH TWO (2) TIMES A DAY AS NEEDED (PAIN). 90 tablet 1    midazolam 5 mg/spray (0.1 mL) Spry Give 1 spray (5mg ) into 1 nostril. If no response in 10 min, may give second spray in other nostril. Do not give second dose if patient has trouble breathing or excessive sedation. Max 2 doses/seizure episode, 1 episode every 3 days or 5 episodes/month. 2 each 4    oxybutynin (DITROPAN) 5 MG tablet Take 1 tablet (5 mg total) by mouth Three (3) times a day. (Patient taking differently: Take 1 tablet (5 mg total) by mouth in the morning.) 270 tablet 3    tamsulosin (FLOMAX) 0.4 mg capsule TAKE 1 CAPSULE BY MOUTH EVERY DAY 90 capsule 3    triamcinolone (KENALOG) 0.1 % ointment Apply topically two (2) times a day. To irritated areas on body 80 g 2    upadacitinib (RINVOQ) 30 mg tablet Take 1 tablet (30 mg total) by mouth daily. 90 tablet 0    upadacitinib (RINVOQ) 45 mg Tb24 Take 1 tablet (45 mg total) by mouth daily. 84 tablet 0     No current facility-administered medications for this visit.       Allergies  Allergen Reactions    Infliximab      Seizure activity  Other reaction(s): Other (See Comments)  Seizures       Patient Active Problem List   Diagnosis    Dens fracture (CMS-HCC)    Crohn's disease of small and large intestines with complication (CMS-HCC)    Arthritis associated with inflammatory bowel disease    Anal condyloma    Wound dehiscence, surgical    Fall    Postoperative wound infection    Pressure injury of skin    Protein-calorie malnutrition, severe (CMS-HCC)    Spondylolysis    Crohn's colitis (CMS-HCC)    Closed burst fracture of lumbar vertebra with routine healing    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    Appropriateness of Therapy     Acute infections noted within Epic:  No active infections  Patient reported infection: None    Is the medication and dose appropriate based on diagnosis, medication list, comorbidities, allergies, medical history, patient???s ability to self-administer the medication, and therapeutic goals? Yes    Prescription has been clinically reviewed: Yes      Baseline Quality of Life Assessment      How many days over the past month did your CD  keep you from your normal activities? For example, brushing your teeth or getting up in the morning. Patient declined to answer    Financial Information     Medication Assistance provided: Prior Authorization and Copay Assistance    Anticipated copay of $0 reviewed with patient. Verified delivery address.    Delivery Information     Scheduled delivery date: 10/2    Expected start date: 10/2      Medication will be delivered via Next Day Courier to the prescription address in [facility].  This shipment will not require a signature.      Explained the services we provide at [facility] and that each month we would call to set up refills.  Stressed importance of returning phone calls so that we could ensure they receive their medications in time each month.  Informed patient that we should be setting up refills 7-10 days prior to when they will run out of medication.  A pharmacist will reach out to perform a clinical assessment periodically.  Informed patient that a welcome packet, containing information about our pharmacy and other support services, a Notice of Privacy Practices, and a drug information handout will be sent.      The patient or caregiver noted above participated in the development of this care plan and knows that they can request review of or adjustments to the care plan at any time.      Patient or caregiver verbalized understanding of the above information as well as how to contact the pharmacy at [phone] option 4 with any questions/concerns.  The pharmacy is open Monday through Friday 8:30am-4:30pm.  A pharmacist is available 24/7 via pager to answer any clinical questions they may have.    Patient Specific Needs     Does the patient have any physical, cognitive, or cultural barriers? No    Does the patient have adequate living arrangements? (i.e. the ability to store and take their medication appropriately) Yes    Did you identify any home environmental  safety or security hazards? No    Patient prefers to have medications discussed with  Patient     Is the patient or caregiver able to read and understand education materials at a high school level or above? Yes    Patient's primary language is  English     Is the patient high risk? No    SOCIAL DETERMINANTS OF HEALTH     At the [facility], we have learned that life circumstances - like trouble affording food, housing, utilities, or transportation can affect the health of many of our patients.   That is why we wanted to ask: are you currently experiencing any life circumstances that are negatively impacting your health and/or quality of life? No    Social Determinants of Health     Food Insecurity: No Food Insecurity (06/17/2021)    Hunger Vital Sign     Worried About Running Out of Food in the Last Year: Never true     Ran Out of Food in the Last Year: Never true   Internet Connectivity: Not on file   Housing/Utilities: Low Risk  (06/17/2021)    Housing/Utilities     Within the past 12 months, have you ever stayed: outside, in a car, in a tent, in an overnight shelter, or temporarily in someone else's home (i.e. couch-surfing)?: No     Are you worried about losing your housing?: No     Within the past 12 months, have you been unable to get utilities (heat, electricity) when it was really needed?: No   Tobacco Use: Low Risk  (03/20/2023)    Patient History     Smoking Tobacco Use: Never     Smokeless Tobacco Use: Never     Passive Exposure: Not on file   Transportation Needs: No Transportation Needs (08/05/2021)    PRAPARE - Transportation     Lack of Transportation (Medical): No     Lack of Transportation (Non-Medical): No   Recent Concern: Transportation Needs - Unmet Transportation Needs (06/01/2021)    PRAPARE - Therapist, art (Medical): Yes     Lack of Transportation (Non-Medical): Yes   Alcohol Use: Not on file   Interpersonal Safety: Unknown (04/05/2023)    Interpersonal Safety     Unsafe Where You Currently Live: Not on file     Physically Hurt by Anyone: Not on file     Abused by Anyone: Not on file   Physical Activity: Not on file   Intimate Partner Violence: Not on file   Stress: Not on file   Substance Use: Not on file   Social Connections: Not on file   Financial Resource Strain: Low Risk  (06/17/2021)    Overall Financial Resource Strain (CARDIA)     Difficulty of Paying Living Expenses: Not hard at all   Depression: Not at risk (08/23/2021)    PHQ-2     PHQ-2 Score: 2   Health Literacy: Low Risk  (08/05/2021)    Health Literacy     : Never   Recent Concern: Health Literacy - Medium Risk (07/17/2021)    Health Literacy     : Rarely       Would you be willing to receive help with any of the needs that you have identified today? Not applicable       Teofilo Pod, PharmD  [facility] Specialty Pharmacist

## 2023-04-07 LAB — QUANTIFERON TB GOLD PLUS
QUANTIFERON MITOGEN VALUE: >10 [IU]/mL
QUANTIFERON NIL VALUE: 0.06 [IU]/mL
QUANTIFERON TB GOLD: NEGATIVE
QUANTIFERON TB1 AG VALUE: 0.1 [IU]/mL
QUANTIFERON TB2 AG VALUE: 0.08 [IU]/mL

## 2023-04-30 MED ORDER — AZATHIOPRINE 50 MG TABLET
ORAL_TABLET | ORAL | 1 refills | 0 days
Start: 2023-04-30 — End: ?

## 2023-05-01 MED ORDER — AZATHIOPRINE 50 MG TABLET
ORAL_TABLET | ORAL | 0 refills | 0 days
Start: 2023-05-01 — End: ?

## 2023-05-01 NOTE — Unmapped (Signed)
[facility] Delivery Pharm[facility] Clinical Assessment & Refill Coordination Note    Kerry Mooney, DOB: July 04, 1981  Phone: [phone] (home)     All above HIPAA information was verified with patient.     Was a Nurse, learning disability used for this call? No    Specialty Medication(s):   Inflammatory Disorders: Rinvoq     Current Outpatient Medications   Medication Sig Dispense Refill    ascorbic acid, vitamin C, (VITAMIN C) 500 MG tablet Take 1 tablet (500 mg total) by mouth daily.      azathioprine (IMURAN) 50 mg tablet TAKE 3 TABLETS BY MOUTH EVERY DAY 90 tablet 0    busPIRone (BUSPAR) 15 MG tablet TAKE 1 TABLET (15 MG TOTAL) BY MOUTH THREE (3) TIMES A DAY. 270 tablet 3    ferrous sulfate 325 (65 FE) MG tablet Take 1 tablet (325 mg total) by mouth in the morning.      fluocinonide (LIDEX) 0.05 % external solution APPLY TOPICALLY TWO (2) TIMES A DAY. TO SCALP 60 mL 0    levETIRAcetam (KEPPRA) 750 MG tablet Take 2 tablets (1,500 mg total) by mouth two (2) times a day. 360 tablet 3    methocarbamol (ROBAXIN) 500 MG tablet TAKE 1 TABLET (500 MG TOTAL) BY MOUTH TWO (2) TIMES A DAY AS NEEDED (PAIN). 90 tablet 1    midazolam 5 mg/spray (0.1 mL) Spry Give 1 spray (5mg ) into 1 nostril. If no response in 10 min, may give second spray in other nostril. Do not give second dose if patient has trouble breathing or excessive sedation. Max 2 doses/seizure episode, 1 episode every 3 days or 5 episodes/month. 2 each 4    oxybutynin (DITROPAN) 5 MG tablet Take 1 tablet (5 mg total) by mouth Three (3) times a day. (Patient taking differently: Take 1 tablet (5 mg total) by mouth in the morning.) 270 tablet 3    tamsulosin (FLOMAX) 0.4 mg capsule TAKE 1 CAPSULE BY MOUTH EVERY DAY 90 capsule 3    triamcinolone (KENALOG) 0.1 % ointment Apply topically two (2) times a day. To irritated areas on body 80 g 2    upadacitinib (RINVOQ) 30 mg tablet Take 1 tablet (30 mg total) by mouth daily. 90 tablet 0    upadacitinib (RINVOQ) 45 mg Tb24 Take 1 tablet (45 mg total) by mouth daily. 84 tablet 0     No current facility-administered medications for this visit.        Changes to medications: Whalen reports no changes at this time.    Allergies   Allergen Reactions    Infliximab      Seizure activity  Other reaction(s): Other (See Comments)  Seizures       Changes to allergies: No    SPECIALTY MEDICATION ADHERENCE     Rinvoq 45 mg: ~9 days of medicine on hand   Medication Adherence    Patient reported X missed doses in the last month: 0  Specialty Medication: Rinvoq 45mg  - 1QD  Patient is on additional specialty medications: No  Patient is on more than two specialty medications: No  Informant: patient          Specialty medication(s) dose(s) confirmed: Regimen is correct and unchanged.     Are there any concerns with adherence? No    Adherence counseling provided? Not needed    CLINICAL MANAGEMENT AND INTERVENTION      Clinical Benefit Assessment:    Do you feel the medicine is effective or helping  your condition? Yes    Clinical Benefit counseling provided? Not needed    Adverse Effects Assessment:    Are you experiencing any side effects? No    Are you experiencing difficulty administering your medicine? No    Quality of Life Assessment:    Quality of Life    Rheumatology  Oncology  Dermatology  Cystic Fibrosis          How many days over the past month did your Crohn's  keep you from your normal activities? For example, brushing your teeth or getting up in the morning. 0    Have you discussed this with your provider? Not needed    Acute Infection Status:    Acute infections noted within Epic:  No active infections  Patient reported infection: None    Therapy Appropriateness:    Is therapy appropriate based on current medication list, adverse reactions, adherence, clinical benefit and progress toward achieving therapeutic goals? Yes, therapy is appropriate and should be continued     DISEASE/MEDICATION-SPECIFIC INFORMATION      N/A    Chronic Inflammatory Diseases: Have you experienced any flares in the last month? No  Has this been reported to your provider? Not applicable    PATIENT SPECIFIC NEEDS     Does the patient have any physical, cognitive, or cultural barriers? No    Is the patient high risk? No    Did the patient require a clinical intervention? No    Does the patient require physician intervention or other additional services (i.e., nutrition, smoking cessation, social work)? No    SOCIAL DETERMINANTS OF HEALTH     At the [facility], we have learned that life circumstances - like trouble affording food, housing, utilities, or transportation can affect the health of many of our patients.   That is why we wanted to ask: are you currently experiencing any life circumstances that are negatively impacting your health and/or quality of life? No    Social Determinants of Health     Food Insecurity: No Food Insecurity (06/17/2021)    Hunger Vital Sign     Worried About Running Out of Food in the Last Year: Never true     Ran Out of Food in the Last Year: Never true   Internet Connectivity: Not on file   Housing/Utilities: Low Risk  (06/17/2021)    Housing/Utilities     Within the past 12 months, have you ever stayed: outside, in a car, in a tent, in an overnight shelter, or temporarily in someone else's home (i.e. couch-surfing)?: No     Are you worried about losing your housing?: No     Within the past 12 months, have you been unable to get utilities (heat, electricity) when it was really needed?: No   Tobacco Use: Low Risk  (03/20/2023)    Patient History     Smoking Tobacco Use: Never     Smokeless Tobacco Use: Never     Passive Exposure: Not on file   Transportation Needs: No Transportation Needs (08/05/2021)    PRAPARE - Transportation     Lack of Transportation (Medical): No     Lack of Transportation (Non-Medical): No   Recent Concern: Transportation Needs - Unmet Transportation Needs (06/01/2021)    PRAPARE - Therapist, art (Medical): Yes     Lack of Transportation (Non-Medical): Yes   Alcohol Use: Not on file   Interpersonal Safety: Unknown (05/01/2023)    Interpersonal Safety  Unsafe Where You Currently Live: Not on file     Physically Hurt by Anyone: Not on file     Abused by Anyone: Not on file   Physical Activity: Not on file   Intimate Partner Violence: Not on file   Stress: Not on file   Substance Use: Not on file   Social Connections: Not on file   Financial Resource Strain: Low Risk  (06/17/2021)    Overall Financial Resource Strain (CARDIA)     Difficulty of Paying Living Expenses: Not hard at all   Depression: Not at risk (08/23/2021)    PHQ-2     PHQ-2 Score: 2   Health Literacy: Low Risk  (08/05/2021)    Health Literacy     : Never   Recent Concern: Health Literacy - Medium Risk (07/17/2021)    Health Literacy     : Rarely       Would you be willing to receive help with any of the needs that you have identified today? Not applicable       SHIPPING     Specialty Medication(s) to be Shipped:   Inflammatory Disorders: Rinvoq    Other medication(s) to be shipped: No additional medications requested for fill at this time     Changes to insurance: No    Delivery Scheduled: Yes, Expected medication delivery date: 10/24.     Medication will be delivered via Next Day Courier to the confirmed prescription address in [facility].    The patient will receive a drug information handout for each medication shipped and additional FDA Medication Guides as required.  Verified that patient has previously received a Conservation officer, historic buildings and a Surveyor, mining.    The patient or caregiver noted above participated in the development of this care plan and knows that they can request review of or adjustments to the care plan at any time.      All of the patient's questions and concerns have been addressed.    Teofilo Pod, PharmD   [facility] Specialty Pharmacist

## 2023-05-01 NOTE — Unmapped (Signed)
Encounter for refill request:  Last clinic visit: 12/27/2022  Colonoscopy: 03/20/23  Appointments which have been scheduled for you      Jun 01, 2023 11:00 AM  (Arrive by 10:45 AM)  NEW PHARMD with Payal Terence Lux, CPP  [facility] [place] [facility] REGION) [address]  [facility] 1 through 4  [place] [address]  [phone]        Jun 27, 2023 2:00 PM  (Arrive by 1:45 PM)  RETURN IBD with Zetta Bills, MD  [facility] [place] [facility] REG[facility]) [address]  [facility] 1 through 4  [place] [address]  [phone]     Lab Results   Component Value Date    WBC 10.5 12/27/2022    RBC 4.17 (L) 12/27/2022    HGB 13.7 12/27/2022    HCT 40.7 12/27/2022    PLT 224 12/27/2022    ALT 8 (L) 12/27/2022    AST 20 12/27/2022    ALKPHOS 68 12/27/2022    CRP 21.0 (H) 12/27/2022    CREATININE 0.76 12/27/2022       Azathioprine refills refused. Pt is now on Rinvoq.

## 2023-05-03 MED FILL — RINVOQ 45 MG TABLET,EXTENDED RELEASE: ORAL | 28 days supply | Qty: 28 | Fill #1

## 2023-05-30 NOTE — Unmapped (Signed)
[facility] Delivery Pharm[facility] Refill Coordination Note    Specialty Medication(s) to be Shipped:   Inflammatory Disorders: Rinvoq    Other medication(s) to be shipped: No additional medications requested for fill at this time     Kerry Mooney, DOB: 04-Nov-1980  Phone: [phone] (home)       All above HIPAA information was verified with patient.     Was a Nurse, learning disability used for this call? No    Completed refill call assessment today to schedule patient's medication shipment from the [facility]  [phone]).  All relevant notes have been reviewed.     Specialty medication(s) and dose(s) confirmed: Regimen is correct and unchanged.   Changes to medications: Kerry Mooney reports no changes at this time.  Changes to insurance: No  New side effects reported not previously addressed with a pharmacist or physician: None reported  Questions for the pharmacist: No    Confirmed patient received a Conservation officer, historic buildings and a Surveyor, mining with first shipment. The patient will receive a drug information handout for each medication shipped and additional FDA Medication Guides as required.       DISEASE/MEDICATION-SPECIFIC INFORMATION        N/A    SPECIALTY MEDICATION ADHERENCE     Medication Adherence    Patient reported X missed doses in the last month: 0  Specialty Medication: RINVOQ 45 mg  Patient is on additional specialty medications: No              Were doses missed due to medication being on hold? No    Rinvoq 45 mg: 7 days of medicine on hand        REFERRAL TO PHARMACIST     Referral to the pharmacist: Not needed      [facility]     Shipping address confirmed in Epic.       Delivery Scheduled: Yes, Expected medication delivery date: 06/05/23.     Medication will be delivered via Same Day Courier to the prescription address in Epic [place].    Willette Pa   [facility]  Specialty Technician

## 2023-06-01 ENCOUNTER — Institutional Professional Consult (permissible substitution): Admit: 2023-06-01 | Discharge: 2023-06-02 | Payer: BLUE CROSS/BLUE SHIELD

## 2023-06-01 DIAGNOSIS — K50819 Crohn's disease of both small and large intestine with unspecified complications: Principal | ICD-10-CM

## 2023-06-01 LAB — CBC W/ AUTO DIFF
BASOPHILS ABSOLUTE COUNT: 0.1 10*9/L (ref 0.0–0.1)
BASOPHILS RELATIVE PERCENT: 0.5 %
EOSINOPHILS ABSOLUTE COUNT: 0.1 10*9/L (ref 0.0–0.5)
EOSINOPHILS RELATIVE PERCENT: 1 %
HEMATOCRIT: 39.6 % (ref 39.0–48.0)
HEMOGLOBIN: 13.8 g/dL (ref 12.9–16.5)
LYMPHOCYTES ABSOLUTE COUNT: 1.6 10*9/L (ref 1.1–3.6)
LYMPHOCYTES RELATIVE PERCENT: 14.1 %
MEAN CORPUSCULAR HEMOGLOBIN CONC: 34.8 g/dL (ref 32.0–36.0)
MEAN CORPUSCULAR HEMOGLOBIN: 33.7 pg — ABNORMAL HIGH (ref 25.9–32.4)
MEAN CORPUSCULAR VOLUME: 96.9 fL — ABNORMAL HIGH (ref 77.6–95.7)
MEAN PLATELET VOLUME: 7.6 fL (ref 6.8–10.7)
MONOCYTES ABSOLUTE COUNT: 0.8 10*9/L (ref 0.3–0.8)
MONOCYTES RELATIVE PERCENT: 6.7 %
NEUTROPHILS ABSOLUTE COUNT: 8.8 10*9/L — ABNORMAL HIGH (ref 1.8–7.8)
NEUTROPHILS RELATIVE PERCENT: 77.7 %
PLATELET COUNT: 225 10*9/L (ref 150–450)
RED BLOOD CELL COUNT: 4.08 10*12/L — ABNORMAL LOW (ref 4.26–5.60)
RED CELL DISTRIBUTION WIDTH: 16 % — ABNORMAL HIGH (ref 12.2–15.2)
WBC ADJUSTED: 11.3 10*9/L — ABNORMAL HIGH (ref 3.6–11.2)

## 2023-06-01 LAB — COMPREHENSIVE METABOLIC PANEL
ALBUMIN: 4.2 g/dL (ref 3.4–5.0)
ALKALINE PHOSPHATASE: 72 U/L (ref 46–116)
ALT (SGPT): 15 U/L (ref 10–49)
ANION GAP: 9 mmol/L (ref 5–14)
AST (SGOT): 22 U/L (ref <=34)
BILIRUBIN TOTAL: 0.5 mg/dL (ref 0.3–1.2)
BLOOD UREA NITROGEN: 16 mg/dL (ref 9–23)
BUN / CREAT RATIO: 20
CALCIUM: 10.3 mg/dL (ref 8.7–10.4)
CHLORIDE: 101 mmol/L (ref 98–107)
CO2: 28.4 mmol/L (ref 20.0–31.0)
CREATININE: 0.8 mg/dL (ref 0.73–1.18)
EGFR CKD-EPI (2021) MALE: >90 mL/min/{1.73_m2} (ref >=60)
GLUCOSE RANDOM: 87 mg/dL (ref 70–179)
POTASSIUM: 4 mmol/L (ref 3.4–4.8)
PROTEIN TOTAL: 7.9 g/dL (ref 5.7–8.2)
SODIUM: 138 mmol/L (ref 135–145)

## 2023-06-01 LAB — LIPID PANEL
CHOLESTEROL/HDL RATIO SCREEN: 2.1 (ref 1.0–4.5)
CHOLESTEROL: 159 mg/dL (ref <=200)
HDL CHOLESTEROL: 76 mg/dL — ABNORMAL HIGH (ref 40–60)
LDL CHOLESTEROL CALCULATED: 72 mg/dL (ref 40–99)
NON-HDL CHOLESTEROL: 83 mg/dL (ref 70–130)
TRIGLYCERIDES: 54 mg/dL (ref 0–150)
VLDL CHOLESTEROL CAL: 10.8 mg/dL — ABNORMAL LOW (ref 11–50)
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
## 2023-06-05 MED FILL — RINVOQ 45 MG TABLET,EXTENDED RELEASE: ORAL | 28 days supply | Qty: 28 | Fill #2

## 2023-06-27 NOTE — Unmapped (Signed)
[facility] Clinical Assessment & Refill Coordination Note    Kerry Mooney, DOB: March 30, 1981  Phone: [phone] (home)     All above HIPAA information was verified with patient.     Was a Nurse, learning disability used for this call? No    Specialty Medication(s):   Inflammatory Disorders: Rinvoq     Current Outpatient Medications   Medication Sig Dispense Refill    ascorbic acid, vitamin C, (VITAMIN C) 500 MG tablet Take 1 tablet (500 mg total) by mouth daily.      busPIRone (BUSPAR) 15 MG tablet TAKE 1 TABLET (15 MG TOTAL) BY MOUTH THREE (3) TIMES A DAY. 270 tablet 3    ferrous sulfate 325 (65 FE) MG tablet Take 1 tablet (325 mg total) by mouth in the morning.      fluocinonide (LIDEX) 0.05 % external solution APPLY TOPICALLY TWO (2) TIMES A DAY. TO SCALP 60 mL 0    levETIRAcetam (KEPPRA) 750 MG tablet Take 2 tablets (1,500 mg total) by mouth two (2) times a day. 360 tablet 3    methocarbamol (ROBAXIN) 500 MG tablet TAKE 1 TABLET (500 MG TOTAL) BY MOUTH TWO (2) TIMES A DAY AS NEEDED (PAIN). 90 tablet 1    midazolam 5 mg/spray (0.1 mL) Spry Give 1 spray (5mg ) into 1 nostril. If no response in 10 min, may give second spray in other nostril. Do not give second dose if patient has trouble breathing or excessive sedation. Max 2 doses/seizure episode, 1 episode every 3 days or 5 episodes/month. 2 each 4    oxybutynin (DITROPAN) 5 MG tablet Take 1 tablet (5 mg total) by mouth Three (3) times a day. (Patient taking differently: Take 1 tablet (5 mg total) by mouth in the morning.) 270 tablet 3    tamsulosin (FLOMAX) 0.4 mg capsule TAKE 1 CAPSULE BY MOUTH EVERY DAY 90 capsule 3    triamcinolone (KENALOG) 0.1 % ointment Apply topically two (2) times a day. To irritated areas on body 80 g 2    upadacitinib (RINVOQ) 30 mg tablet Take 1 tablet (30 mg total) by mouth daily. 90 tablet 0    upadacitinib (RINVOQ) 45 mg Tb24 Take 1 tablet (45 mg total) by mouth daily. 84 tablet 0     No current facility-administered medications for this visit.        Changes to medications: Kerry Mooney reports no changes at this time.    Allergies   Allergen Reactions    Infliximab      Seizure activity  Other reaction(s): Other (See Comments)  Seizures       Changes to allergies: No    SPECIALTY MEDICATION ADHERENCE     Rinvoq  45 mg: 7 days of medicine on hand       Medication Adherence    Patient reported X missed doses in the last month: 0  Specialty Medication: Rinvoq  Patient is on additional specialty medications: No  Informant: patient          Specialty medication(s) dose(s) confirmed: Regimen is correct and unchanged.     Are there any concerns with adherence? No    Adherence counseling provided? Not needed    CLINICAL MANAGEMENT AND INTERVENTION      Clinical Benefit Assessment:    Do you feel the medicine is effective or helping your condition? Yes    Clinical Benefit counseling provided?  Has reduced some symptoms    Adverse Effects Assessment:    Are you  experiencing any side effects? No    Are you experiencing difficulty administering your medicine? No    Quality of Life Assessment:    Quality of Life    Rheumatology  Oncology  Dermatology  Cystic Fibrosis          How many days over the past month did your Crohn's  keep you from your normal activities? For example, brushing your teeth or getting up in the morning. Patient declined to answer    Have you discussed this with your provider? Not needed    Acute Infection Status:    Acute infections noted within Epic:  No active infections  Patient reported infection: None    Therapy Appropriateness:    Is therapy appropriate based on current medication list, adverse reactions, adherence, clinical benefit and progress toward achieving therapeutic goals? Yes, therapy is appropriate and should be continued     DISEASE/MEDICATION-SPECIFIC INFORMATION      N/A    Chronic Inflammatory Diseases: Have you experienced any flares in the last month? No    PATIENT SPECIFIC NEEDS     Does the patient have any physical, cognitive, or cultural barriers? No    Is the patient high risk? No    Did the patient require a clinical intervention? No    Does the patient require physician intervention or other additional services (i.e., nutrition, smoking cessation, social work)? No    SOCIAL DETERMINANTS OF HEALTH     At the [facility], we have learned that life circumstances - like trouble affording food, housing, utilities, or transportation can affect the health of many of our patients.   That is why we wanted to ask: are you currently experiencing any life circumstances that are negatively impacting your health and/or quality of life? No    Social Drivers of Health     Food Insecurity: No Food Insecurity (06/17/2021)    Hunger Vital Sign     Worried About Running Out of Food in the Last Year: Never true     Ran Out of Food in the Last Year: Never true   Internet Connectivity: Not on file   Housing/Utilities: Low Risk  (06/17/2021)    Housing/Utilities     Within the past 12 months, have you ever stayed: outside, in a car, in a tent, in an overnight shelter, or temporarily in someone else's home (i.e. couch-surfing)?: No     Are you worried about losing your housing?: No     Within the past 12 months, have you been unable to get utilities (heat, electricity) when it was really needed?: No   Tobacco Use: Low Risk  (06/01/2023)    Patient History     Smoking Tobacco Use: Never     Smokeless Tobacco Use: Never     Passive Exposure: Not on file   Transportation Needs: No Transportation Needs (08/05/2021)    PRAPARE - Transportation     Lack of Transportation (Medical): No     Lack of Transportation (Non-Medical): No   Recent Concern: Transportation Needs - Unmet Transportation Needs (06/01/2021)    PRAPARE - Therapist, art (Medical): Yes     Lack of Transportation (Non-Medical): Yes   Alcohol Use: Not on file   Interpersonal Safety: Not on file   Physical Activity: Not on file   Intimate Partner Violence: Not on file   Stress: Not on file   Substance Use: Not on file (05/17/2023)   Social Connections: Not  on file   Financial Resource Strain: Low Risk  (06/17/2021)    Overall Financial Resource Strain (CARDIA)     Difficulty of Paying Living Expenses: Not hard at all   Depression: Not at risk (08/23/2021)    PHQ-2     PHQ-2 Score: 2   Health Literacy: Low Risk  (08/05/2021)    Health Literacy     : Never   Recent Concern: Health Literacy - Medium Risk (07/17/2021)    Health Literacy     : Rarely       Would you be willing to receive help with any of the needs that you have identified today? Not applicable       SHIPPING     Specialty Medication(s) to be Shipped:   Inflammatory Disorders: Rinvoq    Other medication(s) to be shipped: No additional medications requested for fill at this time     Changes to insurance: No    Delivery Scheduled: Yes, Expected medication delivery date: 12/20.     Medication will be delivered via Next Day Courier to the confirmed prescription address in [facility].    The patient will receive a drug information handout for each medication shipped and additional FDA Medication Guides as required.  Verified that patient has previously received a Conservation officer, historic buildings and a Surveyor, mining.    The patient or caregiver noted above participated in the development of this care plan and knows that they can request review of or adjustments to the care plan at any time.      All of the patient's questions and concerns have been addressed.    Julianne Rice, PharmD   [facility] Specialty Pharmacist

## 2023-06-29 MED FILL — RINVOQ 30 MG TABLET,EXTENDED RELEASE: ORAL | 30 days supply | Qty: 30 | Fill #0

## 2023-07-27 NOTE — Unmapped (Signed)
[facility] Special[facility] Home Delivery Pharm[facility] Refill Coordination Note    Kerry Mooney, DOB: 02/28/1981  Phone: [phone] (home)       All above HIPAA information was verified with patient.         07/27/2023     8:45 AM   Specialty Rx Medication Refill Questionnaire   Which Medications would you like refilled and shipped? Rinvoq, including today, I have 10 daily doses left on hand   Please list all current allergies: none   Have you missed any doses in the last 30 days? No   Have you had any changes to your medication(s) since your last refill? No   How many days remaining of each medication do you have at home? 10   Have you experienced any side effects in the last 30 days? No   Please enter the full address (street address, city, state, zip code) where you would like your medication(s) to be delivered to. [address], [place], [place] [address]   Please specify on which day you would like your medication(s) to arrive. Note: if you need your medication(s) within 3 days, please call the pharmacy to schedule your order at [phone]  07/31/2023   Has your insurance changed since your last refill? No   Would you like a pharmacist to call you to discuss your medication(s)? No   Do you require a signature for your package? (Note: if we are billing Medicare Part B or your order contains a controlled substance, we will require a signature) No         Completed refill call assessment today to schedule patient's medication shipment from the [facility] [phone]).  All relevant notes have been reviewed.       Confirmed patient received a Conservation officer, historic buildings and a Surveyor, mining with first shipment. The patient will receive a drug information handout for each medication shipped and additional FDA Medication Guides as required.         REFERRAL TO PHARMACIST     Referral to the pharmacist: Not needed      [facility]     Shipping address confirmed in Epic.     Delivery Scheduled: Yes, Expected medication delivery date: 08/01/23.     Medication will be delivered via Same Day Courier to the prescription address in Epic [place].    Willette Pa   [facility] Specialty Technician

## 2023-08-01 DIAGNOSIS — K50819 Crohn's disease of both small and large intestine with unspecified complications: Principal | ICD-10-CM

## 2023-08-01 NOTE — Unmapped (Signed)
Kerry Mooney 's RINVOQ 30 mg tablet (upadacitinib) shipment will be delayed as a result of a high copay.     I have reached out to the patient  at [phone]  and communicated the delay. We will call the patient back to reschedule the delivery upon resolution. We have not confirmed the new delivery date.

## 2023-08-02 NOTE — Unmapped (Signed)
[name] 's RINVOQ 30 mg tablet (upadacitinib) shipment will be rescheduled as a result of copay is now approved by patient/caregiver.      I have spoken with the patient  at [phone]  and communicated the delivery change. We will reschedule the medication for the delivery date that the patient agreed upon.  We have confirmed the delivery date as 08/03/23

## 2023-08-03 MED FILL — RINVOQ 30 MG TABLET,EXTENDED RELEASE: ORAL | 30 days supply | Qty: 30 | Fill #1

## 2023-08-24 NOTE — Unmapped (Signed)
[facility] Specialty and Home Delivery Pharmacy Refill Coordination Note    Kerry Mooney, DOB: [birth year]  Phone: [phone] (home)       All above HIPAA information was verified with patient.         08/24/2023     9:12 AM   Specialty Rx Medication Refill Questionnaire   Which Medications would you like refilled and shipped? Rinvoq   Please list all current allergies: none   Have you missed any doses in the last 30 days? No   Have you had any changes to your medication(s) since your last refill? No   How many days remaining of each medication do you have at home? 10, including today's dose   Have you experienced any side effects in the last 30 days? No   Please enter the full address (street address, city, state, zip code) where you would like your medication(s) to be delivered to. [address], [place], [place] [address]   Please specify on which day you would like your medication(s) to arrive. Note: if you need your medication(s) within 3 days, please call the pharmacy to schedule your order at [phone]  08/30/2023   Has your insurance changed since your last refill? No   Would you like a pharmacist to call you to discuss your medication(s)? No   Do you require a signature for your package? (Note: if we are billing Medicare Part B or your order contains a controlled substance, we will require a signature) No         Completed refill call assessment today to schedule patient's medication shipment from the [facility] Delivery Pharm[facility] [phone]).  All relevant notes have been reviewed.       Confirmed patient received a Conservation officer, historic buildings and a Surveyor, mining with first shipment. The patient will receive a drug information handout for each medication shipped and additional FDA Medication Guides as required.         REFERRAL TO PHARMACIST     Referral to the pharmacist: Not needed      [facility]     Shipping address confirmed in Epic.     Delivery Scheduled: Yes, Expected medication delivery date: 08/30/23.     Medication will be delivered via Same Day Courier to the prescription address in Epic [facility].    Unk Lightning   [facility] Delivery Pharm[facility] Specialty Technician

## 2023-08-30 MED FILL — RINVOQ 30 MG TABLET,EXTENDED RELEASE: ORAL | 30 days supply | Qty: 30 | Fill #2

## 2023-09-20 DIAGNOSIS — K50819 Crohn's disease of both small and large intestine with unspecified complications: Principal | ICD-10-CM

## 2023-09-20 MED ORDER — UPADACITINIB ER 30 MG TABLET,EXTENDED RELEASE 24 HR
ORAL_TABLET | Freq: Every day | ORAL | 0 refills | 90.00 days | Status: CP
Start: 2023-09-20 — End: ?
  Filled 2023-10-02: qty 30, 30d supply, fill #0

## 2023-09-20 NOTE — Unmapped (Signed)
 Encounter for refill request:  Last clinic visit: 12/27/2022  Colonoscopy: 03/20/23  Appointments which have been scheduled for you      Oct 03, 2023 11:00 AM  (Arrive by 10:35 AM)  RETURN  EPILEPSY with Maceo Pro, FNP  [facility] NEUROLOGY CLINIC [address] [facility] REGION) [address]  [place] [address]  [phone]        Oct 31, 2023 2:00 PM  (Arrive by 1:45 PM)  RETURN IBD with Zetta Bills, MD  [facility] GI MEDICINE [place] [facility] REGION) [address]  [facility] [address]  [place] [address]  [phone]        Nov 09, 2023 4:20 PM  (Arrive by 4:05 PM)  Physical with Molli Hazard, MD  [facility]) [address] [place] [address]  [phone]   Arrive 15 minutes prior to appointment.         Nov 13, 2023 2:00 PM  (Arrive by 1:45 PM)  RETURN  GENERAL with Ronelle Nigh, MD  [facility] PHYSICAL MEDICINE [facility] [address] [place] [facility] REGION) [phone] [place] [address]  [phone]        Mar 14, 2024 10:00 AM  (Arrive by 9:45 AM)  RETURN  RHEUMATOLOGY with Malvin Johns, DO  [facility] RHEUMATOLOGY SPECIALTY [place]  [facility] REGION) [address]  [facility] [address]  [place] [address]  [phone]             Lab Results   Component Value Date    WBC 11.3 (H) 06/01/2023    RBC 4.08 (L) 06/01/2023    HGB 13.8 06/01/2023    HCT 39.6 06/01/2023    PLT 225 06/01/2023    ALT 15 06/01/2023    AST 22 06/01/2023    ALKPHOS 72 06/01/2023    CRP 21.0 (H) 12/27/2022    CREATININE 0.80 06/01/2023        Rinvoq refills authorized x3 per protocol. Patient is up to date with appointments and labs

## 2023-09-22 NOTE — Unmapped (Signed)
 [facility] Specialty and Home Delivery Pharmacy Refill Coordination Note    Kerry Mooney, DOB: 11-14-1980  Phone: [phone] (home)       All above HIPAA information was verified with patient.         09/21/2023     2:57 PM   Specialty Rx Medication Refill Questionnaire   Which Medications would you like refilled and shipped? Rinvoq, I have 13 doses left after today.   Please list all current allergies: none   Have you missed any doses in the last 30 days? No   Have you had any changes to your medication(s) since your last refill? No   How many days remaining of each medication do you have at home? 13   Have you experienced any side effects in the last 30 days? No   Please enter the full address (street address, city, state, zip code) where you would like your medication(s) to be delivered to. [address], [place], [place] [address]   Please specify on which day you would like your medication(s) to arrive. Note: if you need your medication(s) within 3 days, please call the pharmacy to schedule your order at [phone]  09/28/2023   Has your insurance changed since your last refill? No   Would you like a pharmacist to call you to discuss your medication(s)? No   Do you require a signature for your package? (Note: if we are billing Medicare Part B or your order contains a controlled substance, we will require a signature) No         Completed refill call assessment today to schedule patient's medication shipment from the [facility] Special[facility] and Home Delivery Pharmacy [phone]).  All relevant notes have been reviewed.       Confirmed patient received a Conservation officer, historic buildings and a Surveyor, mining with first shipment. The patient will receive a drug information handout for each medication shipped and additional FDA Medication Guides as required.         REFERRAL TO PHARMACIST     Referral to the pharmacist: Not needed      [facility]     Shipping address confirmed in Epic.     Delivery Scheduled: Yes, Expected medication delivery date: 09/28/23.     Medication will be delivered via Next Day Courier to the prescription address in Epic [place].    Willette Pa   [facility] Specialty and Home Delivery Pharmacy Specialty Technician

## 2023-09-27 NOTE — Unmapped (Signed)
 Kerry Mooney 's RINVOQ 30 mg tablet (upadacitinib) shipment will be delayed as a result of a high copay.     I have reached out to the patient  via MyChart message and communicated the delay. We will wait for a call back from the patient to reschedule the delivery.  We have not confirmed the new delivery date.

## 2023-09-28 MED ORDER — BUSPIRONE 15 MG TABLET
ORAL_TABLET | Freq: Three times a day (TID) | ORAL | 1 refills | 90.00 days | Status: CP
Start: 2023-09-28 — End: 2024-09-27

## 2023-09-28 NOTE — Unmapped (Signed)
 Next Appt With Family Medicine Elton Sin, MD)  11/09/2023 at 4:20 PM    Unable to complete refill request, medication is not included in the refill protocol.   Please review below request.     Patient is requesting the following refill  Requested Prescriptions     Pending Prescriptions Disp Refills    busPIRone (BUSPAR) 15 MG tablet [Pharmacy Med Name: BUSPIRONE HCL 15 MG TABLET] 270 tablet 3     Sig: TAKE 1 TABLET (15 MG TOTAL) BY MOUTH THREE (3) TIMES A DAY.       Recent Visits  No visits were found meeting these conditions.  Showing recent visits within past 365 days and meeting all other requirements  Future Appointments  Date Type Provider Dept   11/09/23 Appointment Bodnar, Genevieve Norlander, MD [facility]   Showing future appointments within next 365 days and meeting all other requirements       Labs: Not applicable this refill

## 2023-10-02 NOTE — Unmapped (Signed)
 Kerry Mooney 's Rinvoq shipment will be rescheduled as a result of a different copay.      I have spoken with the patient  via incoming phone call and communicated the delivery change. We will reschedule the medication for the delivery date that the patient agreed upon.  We have confirmed the delivery date as 10/02/2023

## 2023-10-03 ENCOUNTER — Ambulatory Visit: Admit: 2023-10-03 | Discharge: 2023-10-04 | Payer: BLUE CROSS/BLUE SHIELD

## 2023-10-03 DIAGNOSIS — G40909 Epilepsy, unspecified, not intractable, without status epilepticus: Principal | ICD-10-CM

## 2023-10-03 NOTE — Unmapped (Addendum)
 Assessment:   Kerry Mooney is a 43 y.o. right handed male who is being evaluated for seizure.  His last seizure was on 05/09/2022 feeling dazed.    Patient last seen by Dr. Corrinne Eagle on 12/28/2022.  He is new to me    The patient has a history of childhood seizures treated with phenobarbital from 19 months for age for 2 years. He had not further seizures until March 2016.  He reports hitting his head prior to seizure recurrence in October 2015. He then progressed to have 4 seizures.     A routine EEG (08/2022) showed left temporal transients F7> T 3.     Seizure Type: onset in childhood and recurred on 09/2014  Seizure Etiology: unknown    Previous Seizure Medications:PB-treat from 19 ms to 43 yrs old.       Plan:   I personally spent 41 minutes face-to-face and non-face-to-face in the care of this patient, which includes all pre, intra, and post visit time on the date of service.  All documented time was specific to the E/M visit and does not include any procedures that may have been performed.    Seizure prevention plan  Continue Keppra 1500 mg twice a day   Rescue: [place]-patient has not had the need for    Labs 06/01/2023 reviewed. Hematology mild elevation of WBC at 11.  Chemistries- normal. Levetiracetam level on 12/28/2022 was 40.7 at 11:27 am     We discussed my impressions regarding the patient???s findings, including diagnosis, test results, and implications on future health, effects and side effects of present and future potential medications, as well as further testing and medications required.  - Issues discussed also included safety factors related to driving, swimming and bathing, heights, machinery, burns. We discussed interactions between seizure medications and suicidal thoughts and suicide.   -We discussed future options including increase levetiracetam versus adding on a second agent versus evaluation for neurostimulator device or ablative surgery for seizure if he meets the criteria for intractable epilepsy    Return to clinic for follow up in 1 year    Epilepsy New HPI     HPI: Mr. Kerry Mooney is a 43 y.o. old right handed male  has a past medical history of Anemia, Crohn's colitis (CMS-HCC), and Spondylolysis.The patient reports to have  good  seizure control. No seizure since last visit.   On 10/06/2014: The  patient has chron's disease and while at [facility] for first dose of Remicade, he was noted to be unresponsive, undergoing grand mal seizure (unclear duration, likely <5 min), per notes. The patient had vomited on himself and was incontinent of urine. He was noted to have post-ictal confusion and sore, otherwise he feels back to baseline. This was believed to be a provoked seizures   12/09/2014- Mom witnessed GTC lasting 5 minutes. Levetiracetam 500mg  BID started.  07/12/2020 GTC and 07/2021-presumed focal seizure-feeling dazed without generalized tonic-clonic    Seizure risk factors: The was the product of an uncomplicated pregnancy and delivery.  The patient had a normal development.There a history febrile seizure as an infant or child with phenobarbital used from 19 months for a total of 2 years.  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  There is no family history of seizures or epilepsy. Positive head injury in 2015 from a fall.  Scoliosis.      Onset: The patient had his  first seizure as an infant believed to be febrile and encephalitis related.    Number of seizure types: One  Seizure/seizure-like event  description:   - Aura: No clear warning,   - Semiology: Makes a sound, then whole body shaking, eyes open, drool, bites tongue and unclear UI. This lasts about 2-3 minutes, has a post ictal period long time.   Frequency: As a child 19 months, on meds for 2 years, no seizures until 09/2014. Then sporadic 10/2014, 11/2014, 07/2020, 11/2020, 04/2021, 07/2021, and most recently  05/09/2022  Provoking Features: stress not eating.   Work up done   EEG: 08/30/2022- Interictal Activity:  - Occasional sharp transients in the left temporal region, maximal at F7>T3.   vEEG/ EMU evaluation: No   Brain MRI imaging: in 2016 was normal.   Brain MRI imaging 03/03/2022 was normal.      Since last visit  Patient reported no seizures.  He is compliance with his medication without significant side effects.  He attributed his depression and anxiety as well as lack of energy due to overall health issues and not particularly medication side effects.  He admitted to have some depression and some anxiety though not suicidal.  He also had low energy level.  Patient has some balance issues and some weakness in the left hand.  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       PMH:   Past Medical History:   Diagnosis Date    Anemia     Crohn's colitis (CMS-HCC)     Spondylolysis     Ankylosing spondylitis         FMH:   Family History   Problem Relation Age of Onset    Autoimmune disease Maternal Grandmother         Psoriasis    Polycystic kidney disease Sister     Anesthesia problems Neg Hx     Ulcerative colitis Neg Hx     Crohn's disease Neg Hx     Colorectal Cancer Neg Hx     Pancreatic cancer Neg Hx     Esophageal cancer Neg Hx          SMH:   Social History     Socioeconomic History    Marital status: Single     Spouse name: None    Number of children: None    Years of education: None    Highest education level: None   Tobacco Use    Smoking status: Never    Smokeless tobacco: Never   Vaping Use    Vaping status: Never Used   Substance and Sexual Activity    Alcohol use: Not Currently     Comment: rare    Drug use: No   Social History Psychologist, educational, heavy labor.      Social Drivers of Psychologist, prison and probation services Strain: Low Risk  (06/17/2021)    Overall Financial Resource Strain (CARDIA)     Difficulty of Paying Living Expenses: Not hard at all   Food Insecurity: No Food Insecurity (06/17/2021)    Hunger Vital Sign     Worried About Running Out of Food in the Last Year: Never true     Ran Out of Food in the Last Year: Never true   Transportation Needs: No Transportation Needs (08/05/2021)    PRAPARE - Therapist, art (Medical): No  Lack of Transportation (Non-Medical): No   Recent Concern: Transportation Needs - Unmet Transportation Needs (06/01/2021)    PRAPARE - Therapist, art (Medical): Yes     Lack of Transportation (Non-Medical): Yes         Allergies: Infliximab      Medications:   Current Outpatient Medications   Medication Sig Dispense Refill    ascorbic acid, vitamin C, (VITAMIN C) 500 MG tablet Take 1 tablet (500 mg total) by mouth daily.      busPIRone (BUSPAR) 15 MG tablet TAKE 1 TABLET (15 MG TOTAL) BY MOUTH THREE (3) TIMES A DAY. 270 tablet 1    ferrous sulfate 325 (65 FE) MG tablet Take 1 tablet (325 mg total) by mouth in the morning.      levETIRAcetam (KEPPRA) 750 MG tablet Take 2 tablets (1,500 mg total) by mouth two (2) times a day. 360 tablet 3    oxybutynin (DITROPAN) 5 MG tablet Take 1 tablet (5 mg total) by mouth Three (3) times a day. (Patient taking differently: Take 1 tablet (5 mg total) by mouth in the morning.) 270 tablet 3    tamsulosin (FLOMAX) 0.4 mg capsule TAKE 1 CAPSULE BY MOUTH EVERY DAY 90 capsule 3    upadacitinib (RINVOQ) 30 mg tablet Take 1 tablet (30 mg total) by mouth daily. 90 tablet 0     No current facility-administered medications for this visit.         ROS: Full 10 Points of Review of System are reviewed and negative except stated above in HPI  PMH, surgical History, FMH, Medications and Allergies were reviewed in Epic and updated    Physical Examination:  Vital Sign: Blood pressure 132/72, pulse 58, height 177.8 cm (5' 10), weight 66.4 kg (146 lb 4.8 oz).  PHQ9_ 7  GAD7- 8    General: No acute distress, Well developed/groomed/nourished   Eyes: Pupil equal round reactive to light and accomodation. Extra occular muscles intact, and sclera clear   ENT: Well hydrated moist mucous membrane. Oropharhynx without any erythema or exudate.   Skin: No rash/lesions/breakdown   Psychiatry : appropriate    Extremities: No cyanosis, clubbing or edema.   Musculo Skeletal:No joint tenderness, cervical spine deformity.     Neurological:   Mental Status:  Alert and Oriented to person,place or date and time. Clear speech, intact naming and repetition, follows commands well, normal fund of knowledge and attention span.   Cranial Nerves: II-XII intact.  EOMI.no nystagmus noted. Symmetrical facial sensation and movement, hearing, palatal rise, tongue protrusion and shoulder shrugs.   Motor: normal bulk and tone throughout. 5/5 Strength bilateral on all four extremities.  Slightly weaker left hand grip versus the right.  Sensory: intact to light touch and temperature throughout   Coordination: no tremors noted    mild  dysmetria noted with finger-nose-finger in right hand.   Gait:Romberg was impaired with mild swaying.  Gait is stable

## 2023-10-03 NOTE — Unmapped (Signed)
 Continue with levetiracetam at current dose    [name]  [facility] Neurology Department  Epilepsy Division  [address], [address]  [place]  [place] [address]  Clinic Main line: [phone].   Clinic Fax number: [phone]

## 2023-10-27 NOTE — Unmapped (Signed)
 [facility] Specialty and Home Delivery Pharmacy Refill Coordination Note    Kerry Mooney, DOB: [birth year]  Phone: [phone] (home)       All above HIPAA information was verified with patient.         10/27/2023    11:01 AM   Specialty Rx Medication Refill Questionnaire   Which Medications would you like refilled and shipped? Rinvoq , including today's dose I have 8 days supply.   Please list all current allergies: none   Have you missed any doses in the last 30 days? No   Have you had any changes to your medication(s) since your last refill? No   How many days remaining of each medication do you have at home? 8, including today   Have you experienced any side effects in the last 30 days? No   Please enter the full address (street address, city, state, zip code) where you would like your medication(s) to be delivered to. [address] [place], [place] [address]   Please specify on which day you would like your medication(s) to arrive. Note: if you need your medication(s) within 3 days, please call the pharmacy to schedule your order at [phone]  11/02/2023   Has your insurance changed since your last refill? No   Would you like a pharmacist to call you to discuss your medication(s)? No   Do you require a signature for your package? (Note: if we are billing Medicare Part B or your order contains a controlled substance, we will require a signature) No   I have been provided my out of pocket cost for my medication and approve the pharmacy to charge the amount to my credit card on file. Yes         Completed refill call assessment today to schedule patient's medication shipment from the [facility] [phone]).  All relevant notes have been reviewed.       Confirmed patient received a Conservation officer, historic buildings and a Surveyor, mining with first shipment. The patient will receive a drug information handout for each medication shipped and additional FDA Medication Guides as required. REFERRAL TO PHARMACIST     Referral to the pharmacist: Not needed      [facility]     Shipping address confirmed in Epic.     Delivery Scheduled: Yes, Expected medication delivery date: 11/02/23.     Medication will be delivered via Next Day Courier to the prescription address in Epic [place].    Canary Ceo   [facility] Specialty and Home Delivery Pharmacy Specialty Technician
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
## 2023-10-31 ENCOUNTER — Ambulatory Visit: Admit: 2023-10-31 | Discharge: 2023-11-01 | Payer: BLUE CROSS/BLUE SHIELD

## 2023-10-31 DIAGNOSIS — K50819 Crohn's disease of both small and large intestine with unspecified complications: Principal | ICD-10-CM

## 2023-10-31 DIAGNOSIS — Z79899 Other long term (current) drug therapy: Principal | ICD-10-CM

## 2023-10-31 LAB — COMPREHENSIVE METABOLIC PANEL
ALBUMIN: 4.1 g/dL (ref 3.4–5.0)
ALKALINE PHOSPHATASE: 66 U/L (ref 46–116)
ALT (SGPT): 29 U/L (ref 10–49)
ANION GAP: 10 mmol/L (ref 5–14)
AST (SGOT): 33 U/L (ref <=34)
BILIRUBIN TOTAL: 0.4 mg/dL (ref 0.3–1.2)
BLOOD UREA NITROGEN: 16 mg/dL (ref 9–23)
BUN / CREAT RATIO: 20
CALCIUM: 10.1 mg/dL (ref 8.7–10.4)
CHLORIDE: 104 mmol/L (ref 98–107)
CO2: 27.8 mmol/L (ref 20.0–31.0)
CREATININE: 0.79 mg/dL (ref 0.73–1.18)
EGFR CKD-EPI (2021) MALE: >90 mL/min/1.73m2 (ref >=60)
GLUCOSE RANDOM: 118 mg/dL (ref 70–179)
POTASSIUM: 3.6 mmol/L (ref 3.4–4.8)
PROTEIN TOTAL: 7.7 g/dL (ref 5.7–8.2)
SODIUM: 142 mmol/L (ref 135–145)

## 2023-10-31 LAB — CBC
HEMATOCRIT: 39 % (ref 39.0–48.0)
HEMOGLOBIN: 13.5 g/dL (ref 12.9–16.5)
MEAN CORPUSCULAR HEMOGLOBIN CONC: 34.6 g/dL (ref 32.0–36.0)
MEAN CORPUSCULAR HEMOGLOBIN: 32.1 pg (ref 25.9–32.4)
MEAN CORPUSCULAR VOLUME: 92.7 fL (ref 77.6–95.7)
MEAN PLATELET VOLUME: 6.9 fL (ref 6.8–10.7)
PLATELET COUNT: 236 10*9/L (ref 150–450)
RED BLOOD CELL COUNT: 4.21 10*12/L — ABNORMAL LOW (ref 4.26–5.60)
RED CELL DISTRIBUTION WIDTH: 13.5 % (ref 12.2–15.2)
WBC ADJUSTED: 10.8 10*9/L (ref 3.6–11.2)

## 2023-10-31 LAB — LIPID PANEL
CHOLESTEROL: 150 mg/dL (ref <200)
HDL CHOLESTEROL: 69 mg/dL (ref >40)
LDL CHOLESTEROL CALCULATED: 68 mg/dL (ref <100)
NON-HDL CHOLESTEROL: 81 mg/dL (ref <130)
TRIGLYCERIDES: 92 mg/dL (ref <150)

## 2023-10-31 LAB — IRON & TIBC
IRON SATURATION: 24 % (ref 20–55)
IRON: 72 ug/dL (ref 65–175)
TOTAL IRON BINDING CAPACITY: 298 ug/dL (ref 250–425)

## 2023-10-31 LAB — VITAMIN B12: VITAMIN B-12: 495 pg/mL (ref 211–911)

## 2023-10-31 LAB — FERRITIN: FERRITIN: 242.1 ng/mL (ref 10.5–307.3)

## 2023-10-31 NOTE — Unmapped (Signed)
 Kerry Mooney it was a pleasure seeing you today.  Here is a summary from today's visit:     1.  Continue Rinvoq  30mg  daily as planned  2.  Update labs today including iron levels and cholesterol panel  3.  I will message your new PCP regarding the mild heart murmur  4.  I would recommend doing a stool calprotectin to assess how your Crohn's is doing  5.  We can plan for a colonoscopy in a few months to see response to Rinvoq .   6.  If any trouble or symptoms, do not hesitate to call.     Rana Burr, MD  Associate Professor of Medicine  Division of Gastroenterology & Hepatology  [employer]  [facility]        EXPECTATIONS FOR PATIENT FOLLOW UP AND COMMUNICATION:  -- Follow up Appointments:  Crohn's disease and Ulcerative colitis are serious chronic inflammatory diseases which require close monitoring.  I expect to see patients for a follow up visits at least every 6 months (or more often if you are having a flare, starting new therapy, etc).  In select cases, patients who are only on aminosalicylates / mesalamine, may be seen once per year for follow up.  If you are not able to come for follow up appointments we may not be able to refill your medications or continue caring for you.   -- Appointment Type: While we are continuing to offer video appointments in select cases (stable patients who are not in a flare and without new/active symptoms) during the COVID19 pandemic, I expect to see patients in person at least once per year.   -- Communication:  if you have any questions or concerns, you can communicate with us  via phone (contact information below) or via myChart.  Note that phone messages are given higher priority and triaged first. For urgent issues, please contact us  via phone.      IBD NURSE COORDINATOR CONTACT - Cornel Diesel, RN     Phone: [phone] (direct line)      Fax: [phone]  * For urgent medical concerns after hours or on weekends and holidays, call [phone] and ask to speak to the GI Fellow on call.    * If you have a GI medical question or GI symptoms and would like to speak to your provider's healthcare team, please contact Cornel Diesel, RN (contact information above) OR you can send the GI healthcare team a message through MyChart at [email or web address]    [facility] MESSAGES  -- MyChart messages and advice requests are now going to be sent to our clinical care team. A team member will respond to the message or send it to your provider if needed.   -- The message will typically be addressed within 3-4 business days if it is an issue that can be handled by MyChart.   -- For multiple questions, complex concerns, and/or if you have not been by seen by your provider in the recommended follow-up period you may be asked to schedule a clinic visit.   -- MyChart messages are NOT read after hours or on weekends. MyChart is NOT meant for urgent issues or emergencies. For emergencies call 911 or go to the nearest emergency department.     APPOINTMENT SCHEDULING FOR GI CLINIC AND GI PROCEDURES:  RADIOLOGY - to schedule imaging ordered, please call [phone]  GI PROCEDURES         [phone] option 2   GI MEDICINE CLINIC  [phone] option 1   *To schedule, reschedule, or cancel your GI appointment, please call [phone]. If you are unable to come to an appointment, please notify us  as soon as possible, preferably 24 hours in advance. Doing so may allow other patients with urgent needs to be scheduled in a cancelled appointment slot.     TEST RESULTS   If you have a MyChart account, your new results and a provider message will be sent to you through your MyChart account at [email or web address]. For results that require follow-up, a member of your healthcare team will also contact you.    PRESCRIPTION REFILL REQUESTS  To request prescription refills, please contact your pharmacy or send your healthcare team a message through your MyChart account at [email or web address]  RECORD REQUESTS  For questions related to medical records, please call Medical Records Release of Information at [phone]  FINANCIAL NAVIGATOR   For billing and other financial questions/needs - please contact Wilburt Hands - [phone]. If you need to leave a message, please be sure to leave your full name, date of birth or Medical record number, best call back # and reason for call.    For educational material and resources:  [email or web address]    ================================================================

## 2023-11-01 MED FILL — RINVOQ 30 MG TABLET,EXTENDED RELEASE: ORAL | 30 days supply | Qty: 30 | Fill #1

## 2023-11-13 ENCOUNTER — Ambulatory Visit
Admit: 2023-11-13 | Discharge: 2023-11-14 | Payer: BLUE CROSS/BLUE SHIELD | Attending: Spinal Cord Injury Medicine | Primary: Spinal Cord Injury Medicine

## 2023-11-13 DIAGNOSIS — R351 Nocturia: Principal | ICD-10-CM

## 2023-11-13 DIAGNOSIS — S14151A Other incomplete lesion at C1 level of cervical spinal cord, initial encounter: Principal | ICD-10-CM

## 2023-11-13 NOTE — Unmapped (Signed)
 We referred you to Dr. Teresa Fender for your urinary frequency at night. They should call you to schedule this appointment.

## 2023-11-13 NOTE — Unmapped (Signed)
 [facility] Physical Medicine and Rehabilitation   Clinic Note      Patient Name:Kerry Mooney  MRN: [id number]  DOB: 09-Jun-1981  Age: 43 y.o.   Encounter Date: 11/13/2023     ASSESSMENT:     Patient is a 43 y.o. male seen in the SCI clinic for traumatic C2 ASIA D SCI.        RECOMMENDATIONS:     1. Bowel: Continue following up with GI.     2. Bladder: Continue oxybutynin 5mg  nightly and Flomax 0.4mg  daily. Referred to Dr. Teresa Fender for night night urinary frequency.     3. Spasticity: No issues currently.     4. Skin: No issues currently.     5. Neuropathic pain: No issues currently.     6. Equipment: No new equipment needs.     7. Therapy: Continue to stay active.         Follow up: Return in about 1 year (around 11/12/2024).      SUBJECTIVE:     Chief Complaint: C2 ASIA D    History of Present Illness: Patient is a 43 y.o. year old male seen following his AIR stay in 06/2021 and 07/2021. He was admitted initially to [facility] following a fall 10 FT through a ceiling landing on exercise equipment. 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 PLIF C2-4, l1-2 decompression laminectomy with reduction of fx T12-L3 PLA with pedicle screw fixation and local autographing. PMH: seizures, ankylosing spondylitis, TBI and crohns disease. Patient was admitted to [facility] AIR from 10/18-10/24/2022, readmitted to [facility] on 05/03/21 for assessment of cervical wound due to drainage and concern for infection and readmitted to [facility] AIR from 06/01/21 to 06/16/21 before requiring transfer back to acute for wound debridement/ washout.Patient was readmitted again to [facility] AIR from 06/25/21-07/18/21 before being transferred back to [facility] main for wound washout.    At the time of his discharge, patient had improved bladder sensation and was performing timed toileting.  He was discharged on oxybutynin 5 mg 3 times daily as well as Flomax 0.4 mg nightly.  He had also had improved sensation and control with his bowels and was no longer on a formalized bowel program.  He was contact-guard assist with his transfers using a rolling walker and mod I to set up with most ADLs.  He was still primarily using a power wheelchair for mobility.    SCI Injury/level: C2 ASIA D    Interval History:   Surgical wound is healed.     1. Bowel: He no longer takes colace and has transitioned to Rinvoq for his Chron's which he feels is working well for him. He has a BM every 1-2 days.     2. Bladder: Takes Flomax 0.4mg  nightly. Also takes oxybutynin 5mg  daily. Does urinate frequently throughout the night, 3-4 times nightly, does feel like the stream is irregular at times during the night.     3. Spasticity: Will have leg spasms if he over exerts himself in a day. He states they are not bothersome.     4. Skin: Patient has no skin breakdown anywhere.     5. Neuropathic pain: Patient notes mild neuropathic pain the legs, he notes it is not bothersome.     6. Equipment: He no longer needs rollator, no longer uses shower chair.     7. Therapy: Has not done physical therapy for over a year. Occasionally right leg feels off  balance and notes he does feel nervous at times about taking a turn too fast. Feels left hand stiffness.       Functional History:   Selfcare/feeding: Independent  Dressing: Independent  Transfers: Independent  Ambulation/Mobility: Independent  Bladder Management: Independent  Bowel Management: Independent      Past Medical History:   Diagnosis Date    Anemia     Crohn's colitis     Spondylolysis     Ankylosing spondylitis     Past Surgical History:   Procedure Laterality Date    HERNIA REPAIR      NECK SURGERY  2016    spinal fusion.has screw in his spine    PR COLONOSCOPY W/BIOPSY SINGLE/MULTIPLE  09/15/2014    Procedure: COLONOSCOPY, FLEXIBLE, PROXIMAL TO SPLENIC FLEXURE; WITH BIOPSY, SINGLE OR MULTIPLE;  Surgeon: Rana Burr, MD;  Location: GI PROCEDURES [facility] [facility];  Service: Gastroenterology    PR COLONOSCOPY W/BIOPSY SINGLE/MULTIPLE N/A 10/06/2016    Procedure: COLONOSCOPY, FLEXIBLE, PROXIMAL TO SPLENIC FLEXURE; WITH BIOPSY, SINGLE OR MULTIPLE;  Surgeon: Rana Burr, MD;  Location: GI PROCEDURES [facility];  Service: Gastroenterology    PR COLONOSCOPY W/BIOPSY SINGLE/MULTIPLE N/A 01/04/2021    Procedure: COLONOSCOPY, FLEXIBLE, PROXIMAL TO SPLENIC FLEXURE; WITH BIOPSY, SINGLE OR MULTIPLE;  Surgeon: Rana Burr, MD;  Location: GI PROCEDURES [facility];  Service: Gastroenterology    PR COLONOSCOPY W/BIOPSY SINGLE/MULTIPLE N/A 03/20/2023    Procedure: COLONOSCOPY, FLEXIBLE, PROXIMAL TO SPLENIC FLEXURE; WITH BIOPSY, SINGLE OR MULTIPLE;  Surgeon: Rana Burr, MD;  Location: GI PROCEDURES [place] [facility];  Service: Gastroenterology    PR DEBRIDEMENT MUSCLE &/FASCIA 1ST 20 SQ CM/< Midline 06/18/2021    Procedure: DEBRIDEMENT; SKIN, SUBCUTANEOUS TISSUE, & MUSCLE;  Surgeon: Lasandra Points, MD;  Location: MAIN OR [place];  Service: Plastics    PR DEBRIDEMENT SUBCUTANEOUS TISSUE 1ST 20 SQ CM/< Midline 07/19/2021    Procedure: DEBRIDEMENT; SKIN & SUBCUTANEOUS TISSUE TRUNK;  Surgeon: Lasandra Points, MD;  Location: MAIN OR [place];  Service: Plastics    PR INCISION & DRAINAGE COMPLEX PO WOUND INFECTION Midline 05/20/2021    Procedure: INCISION & DRAINAGE, COMPLEX, POSTOPERATIVE WOUND INFECTION BACK/PRONE;  Surgeon: Lupie Salle, MD;  Location: MAIN OR [place];  Service: Neurosurgery    PR IONM 1 ON 1 IN OR W/ATTENDANCE EACH 15 MINUTES N/A 04/25/2014    Procedure: CONTINUOUS INTRAOPERATIVE NEUROPHYSIOLOGY MONITORING IN OR;  Surgeon: Corinn Dick, MD;  Location: MAIN OR [place];  Service: Neurosurgery    PR MUSCLE-SKIN FLAP,TRUNK Midline 05/24/2021    Procedure: MUSCLE, MYOCUTANEOUS, OR FASCIOCUTANEOUS FLAP; TRUNK;  Surgeon: Lasandra Points, MD;  Location: MAIN OR [place];  Service: Plastics    PR ODONTOID,FX,GRAFTING,OPEN Midline 04/25/2014    Procedure: OPEN TREATMENT &/OR REDCUCTION ODONTOID FX/DISLOCATION, ANTERIOR APPROACH, INTERNAL FIXATION, WITH GRAFT;  Surgeon: Corinn Dick, MD;  Location: MAIN OR [place];  Service: Neurosurgery    PR SURG DIAGNOSTIC EXAM, ANORECTAL N/A 01/21/2021    Procedure: ANORECTAL EXAM, SURGICAL, REQUIRING ANESTHESIA (GENERAL, SPINAL, OR EPIDURAL), DIAGNOSTIC;  Surgeon: Oral Billings, MD;  Location: MAIN OR [place];  Service: Gastrointestinal    PR SURG EXCISION OF ANAL LESION(S) N/A 01/21/2021    Procedure: DESTRUCTION OF LESION(S), ANUS (EG, CONDYLOMA, PAPILLOMA, MOLLUSCUM CONTAGIOSUM) SIMPLE; SURGICAL EXCISION;  Surgeon: Oral Billings, MD;  Location: MAIN OR ;  Service: Gastrointestinal    TONSILLECTOMY         Social History:  reports that he has never smoked. He has never used smokeless tobacco. He reports that he does not  currently use alcohol. He reports that he does not use drugs.     Family History: Patient denies significant family history.                  MEDICATIONS: Reviewed in EPIC. Pertinent as noted in history.    Allergies: Infliximab    Review of Systems: 10 organ systems reviewed and pertinent as noted in HPI.         OBJECTIVE:     Physical Exam:   Vitals: BP 135/72 (BP Site: L Arm, BP Position: Sitting, BP Cuff Size: Medium)  - Pulse 92  - Ht 177.8 cm (5' 10)  - Wt 66.7 kg (147 lb 0.8 oz)  - BMI 21.10 kg/m??   General: Pleasant gentleman in a transport wheelchair no acute distress  Psych: Pleasant appropriate  Eyes: Anicteric, sclera clear  ENT: Moist mucous membranes  CV: Good perfusion in all extremities  Resp: Regular work of breathing on room air  GI: Soft, nondistended  Cranial Nerves: II-XII grossly intact  Muscle Tone: No increased muscle tone appreciated  ROM: Full range of motion in upper extremities and lower extremities  Gait: Wide based gait. Upper body significantly slouched.     Manual Muscle Testing:    Upper Extremities  Muscle Group    Right    Left  Shoulder Elevators C4  5   5  Elbow Flexors C5   5   5  Wrist Extensor C6  5   5  Elbow Extensor C7   5   5  Finger Flexor C8  5   5  Intrinsic T1   5   5    Lower Extremities  Muscle Group    Right    Left  Hip Flexors L2   4+   5  Knee Extensor L3   5   5  Ankle Dorsiflexors L4  4 (to neutral)  5   Plantar flexors S1  5   5      Elma Hacker, DO  [employer]    Physical Medicine and Rehabilitation, PGY2

## 2023-11-14 NOTE — Unmapped (Signed)
 Addended by: Daneille Desilva A on: 11/14/2023 10:53 AM     Modules accepted: Level of Service

## 2023-11-23 NOTE — Unmapped (Signed)
 [facility] Specialty and Home Delivery Pharmacy Refill Coordination Note    Kerry Mooney, DOB: 10-28-1980  Phone: [phone] (home)       All above HIPAA information was verified with patient.         11/23/2023     5:46 AM   Specialty Rx Medication Refill Questionnaire   Which Medications would you like refilled and shipped? Rinvoq; I have 11 days supply left, including today's dose   Please list all current allergies: none   Have you missed any doses in the last 30 days? No   Have you had any changes to your medication(s) since your last refill? No   How many days remaining of each medication do you have at home? 11 days, including today   Have you experienced any side effects in the last 30 days? No   Please enter the full address (street address, city, state, zip code) where you would like your medication(s) to be delivered to. [address], [place], [place] [address]   Please specify on which day you would like your medication(s) to arrive. Note: if you need your medication(s) within 3 days, please call the pharmacy to schedule your order at [phone]  12/01/2023   Has your insurance changed since your last refill? No   Would you like a pharmacist to call you to discuss your medication(s)? No   Do you require a signature for your package? (Note: if we are billing Medicare Part B or your order contains a controlled substance, we will require a signature) No   I have been provided my out of pocket cost for my medication and approve the pharmacy to charge the amount to my credit card on file. Yes         Completed refill call assessment today to schedule patient's medication shipment from the [facility] Delivery [facility] [phone]).  All relevant notes have been reviewed.       Confirmed patient received a Conservation officer, historic buildings and a Surveyor, mining with first shipment. The patient will receive a drug information handout for each medication shipped and additional FDA Medication Guides as required.         REFERRAL TO PHARMACIST     Referral to the pharmacist: Not needed      [facility]     Shipping address confirmed in Epic.     Delivery Scheduled: Yes, Expected medication delivery date: 12/01/23.     Medication will be delivered via Next Day Courier to the prescription address in Epic WAM.    Kerry Mooney   [facility] Specialty and Home Delivery Pharmacy Specialty Technician

## 2023-11-30 MED FILL — RINVOQ 30 MG TABLET,EXTENDED RELEASE: ORAL | 30 days supply | Qty: 30 | Fill #2

## 2023-12-07 ENCOUNTER — Other Ambulatory Visit: Payer: Self-pay

## 2023-12-07 ENCOUNTER — Emergency Department

## 2023-12-07 ENCOUNTER — Emergency Department
Admission: EM | Admit: 2023-12-07 | Discharge: 2023-12-07 | Disposition: A | Discharge status: Home / Self Care | Attending: Emergency Medicine | Admitting: Emergency Medicine

## 2023-12-07 DIAGNOSIS — S00512A Abrasion of oral cavity, initial encounter: Secondary | ICD-10-CM | POA: Diagnosis not present

## 2023-12-07 DIAGNOSIS — X58XXXA Exposure to other specified factors, initial encounter: Secondary | ICD-10-CM | POA: Diagnosis not present

## 2023-12-07 DIAGNOSIS — R569 Unspecified convulsions: Secondary | ICD-10-CM | POA: Insufficient documentation

## 2023-12-07 DIAGNOSIS — S0993XA Unspecified injury of face, initial encounter: Secondary | ICD-10-CM | POA: Diagnosis present

## 2023-12-07 LAB — COMPREHENSIVE METABOLIC PANEL WITH GFR
ALT: 19 U/L (ref 0–44)
AST: 35 U/L (ref 15–41)
Albumin: 3.3 g/dL — ABNORMAL LOW (ref 3.5–5.0)
Alkaline Phosphatase: 44 U/L (ref 38–126)
Anion gap: 14 (ref 5–15)
BUN: 21 mg/dL — ABNORMAL HIGH (ref 6–20)
CO2: 21 mmol/L — ABNORMAL LOW (ref 22–32)
Calcium: 8.9 mg/dL (ref 8.9–10.3)
Chloride: 104 mmol/L (ref 98–111)
Creatinine, Ser: 1.08 mg/dL (ref 0.61–1.24)
GFR, Estimated: >60 mL/min (ref >60)
Glucose, Bld: 96 mg/dL (ref 70–99)
Potassium: 3.3 mmol/L — ABNORMAL LOW (ref 3.5–5.1)
Sodium: 139 mmol/L (ref 135–145)
Total Bilirubin: 0.6 mg/dL (ref 0.0–1.2)
Total Protein: 7.1 g/dL (ref 6.5–8.1)

## 2023-12-07 LAB — CBC WITH DIFFERENTIAL/PLATELET
Abs Immature Granulocytes: 0.24 10*3/uL — ABNORMAL HIGH (ref 0.00–0.07)
Basophils Absolute: 0.1 10*3/uL (ref 0.0–0.1)
Basophils Relative: 1 %
Eosinophils Absolute: 0.1 10*3/uL (ref 0.0–0.5)
Eosinophils Relative: 1 %
HCT: 34.4 % — ABNORMAL LOW (ref 39.0–52.0)
Hemoglobin: 11.4 g/dL — ABNORMAL LOW (ref 13.0–17.0)
Immature Granulocytes: 2 %
Lymphocytes Relative: 15 %
Lymphs Abs: 1.8 10*3/uL (ref 0.7–4.0)
MCH: 30.9 pg (ref 26.0–34.0)
MCHC: 33.1 g/dL (ref 30.0–36.0)
MCV: 93.2 fL (ref 80.0–100.0)
Monocytes Absolute: 0.7 10*3/uL (ref 0.1–1.0)
Monocytes Relative: 6 %
Neutro Abs: 9 10*3/uL — ABNORMAL HIGH (ref 1.7–7.7)
Neutrophils Relative %: 75 %
Platelets: 329 10*3/uL (ref 150–400)
RBC: 3.69 MIL/uL — ABNORMAL LOW (ref 4.22–5.81)
RDW: 13.3 % (ref 11.5–15.5)
WBC: 11.8 10*3/uL — ABNORMAL HIGH (ref 4.0–10.5)
nRBC: 0 % (ref 0.0–0.2)

## 2023-12-07 LAB — URINE DRUG SCREEN, QUALITATIVE (ARMC ONLY)
Amphetamines, Ur Screen: NOT DETECTED
Barbiturates, Ur Screen: NOT DETECTED
Benzodiazepine, Ur Scrn: POSITIVE — AB
Cannabinoid 50 Ng, Ur ~~LOC~~: POSITIVE — AB
Cocaine Metabolite,Ur ~~LOC~~: NOT DETECTED
MDMA (Ecstasy)Ur Screen: NOT DETECTED
Methadone Scn, Ur: NOT DETECTED
Opiate, Ur Screen: NOT DETECTED
Phencyclidine (PCP) Ur S: NOT DETECTED
Tricyclic, Ur Screen: NOT DETECTED

## 2023-12-07 LAB — ETHANOL: Alcohol, Ethyl (B): <15 mg/dL (ref <15)

## 2023-12-07 MED ORDER — LEVETIRACETAM (KEPPRA) 500 MG/5 ML ADULT IV PUSH
1000.0000 mg | INTRAVENOUS | Status: AC
  Administered 2023-12-07: 1000 mg via INTRAVENOUS
  Filled 2023-12-07: qty 10

## 2023-12-07 MED ORDER — LORAZEPAM 2 MG/ML IJ SOLN
INTRAMUSCULAR | Status: DC
Start: 2023-12-07 — End: 2023-12-08
  Filled 2023-12-07: qty 1

## 2023-12-07 MED ORDER — HALOPERIDOL LACTATE 5 MG/ML IJ SOLN
5.0000 mg | Freq: Once | INTRAMUSCULAR | Status: AC
  Administered 2023-12-07: 5 mg via INTRAVENOUS

## 2023-12-07 MED ORDER — LORAZEPAM 2 MG/ML IJ SOLN
2.0000 mg | Freq: Once | INTRAMUSCULAR | Status: AC
  Administered 2023-12-07: 2 mg via INTRAVENOUS

## 2023-12-07 MED ORDER — HALOPERIDOL LACTATE 5 MG/ML IJ SOLN: 5.0000 mg | Freq: Once | INTRAMUSCULAR | Status: DC

## 2023-12-07 MED ORDER — SODIUM CHLORIDE 0.9 % IV BOLUS
500.0000 mL | Freq: Once | INTRAVENOUS | Status: AC
Start: 2023-12-07 — End: 2023-12-07
  Administered 2023-12-07: 500 mL via INTRAVENOUS

## 2023-12-07 MED ORDER — LORAZEPAM 2 MG/ML IJ SOLN
2.0000 mg | Freq: Once | INTRAMUSCULAR | Status: AC
  Administered 2023-12-07: 2 mg via INTRAVENOUS
  Filled 2023-12-07: qty 1

## 2023-12-07 MED ORDER — HALOPERIDOL LACTATE 5 MG/ML IJ SOLN
INTRAMUSCULAR | Status: DC
Start: 2023-12-07 — End: 2023-12-08
  Filled 2023-12-07: qty 1

## 2023-12-07 NOTE — ED Triage Notes (Signed)
 Pt here via [facility] from home with c/o of 2 seizures today, HX of seizures. EMS gave 5mg  of Versed .   18G to L AC.

## 2023-12-07 NOTE — Discharge Instructions (Addendum)
 You had a seizure and were treated in our emergency department.  Your laboratory tests and CT scan showed no concerning findings.  Continue to take your Keppra  as already prescribed and follow-up with your primary care provider and neurologist.  You should not drive or do any other activities that would be dangerous if you were to have a seizure (ex. swimming, riding a bicycle) until you are cleared to do so by your neurologist.  Return to the emergency department with any new or worsening symptoms.

## 2023-12-07 NOTE — ED Notes (Signed)
 Pt currently fighting staff and attempting to get out of bed, MD aware, new orders, see MAR. Verbal de escalation attempted with no success. Psychologist, forensic at bedside.

## 2023-12-07 NOTE — ED Notes (Signed)
 Assisted pt with urinal and provided pt with a blanket.

## 2023-12-07 NOTE — ED Notes (Signed)
 After admin of meds, pt still fighting staff and not staying in bed, MD aware.

## 2023-12-07 NOTE — ED Notes (Signed)
 Pt assisted with urinal, Dad currently at bedside. Sitter at bedside

## 2023-12-07 NOTE — ED Provider Notes (Signed)
 [facility] Provider Note    Event Date/Time   First MD Initiated Contact with Patient 12/07/23 1354     (approximate)   History   Chief Complaint: Seizures   HPI  Julian Allen is a 43 y.o. male with a history of seizures who was brought to the ED due to a seizure at home.  Has been Noncompliant with his medication.  Takes Keppra  1000 twice daily per his mother.  No trauma fever or other recent illness.  EMS gave the patient 5 mg IM Versed  after which he was somnolent.        Past Medical History:  Diagnosis Date   Crohn's disease (HCC)    Seizures (HCC)    Spondylitis [facility])     Current Outpatient Rx   Order #: [id number] Class: Normal   Order #: [id number] Class: Normal   Order #: [id number] Class: Normal   Order #: [id number] Class: Normal   Order #: [id number] Class: Normal   Order #: [id number] Class: Normal   Order #: [id number] Class: Print   Order #: [id number] Class: Normal   Order #: [id number] Class: Normal   Order #: [id number] Class: Print    Past Surgical History:  Procedure Laterality Date   POSTERIOR CERVICAL FUSION/FORAMINOTOMY N/A 04/11/2021   Procedure: Cervical Two - Cervical Four Laminectomy and Posterior Fusion With Instrumentation;  Surgeon: Agustina Aldrich, MD;  Location: [facility] OR;  Service: Neurosurgery;  Laterality: N/A;   SPINE SURGERY      Physical Exam   Triage Vital Signs: ED Triage Vitals  Encounter Vitals Group     BP 12/07/23 1416 104/75     Systolic BP Percentile --      Diastolic BP Percentile --      Pulse Rate 12/07/23 1416 95     Resp 12/07/23 1416 20     Temp 12/07/23 1416 97.8 F (36.6 C)     Temp Source 12/07/23 1416 Axillary     SpO2 12/07/23 1416 93 %     Weight --      Height --      Head Circumference --      Peak Flow --      Pain Score 12/07/23 1400 0     Pain Loc --      Pain Education --      Exclude from Growth Chart --     Most recent vital signs: Vitals:   12/07/23 1416 12/07/23 1514  BP:  104/75 (!) 111/58  Pulse: 95 100  Resp: 20   Temp: 97.8 F (36.6 C)   SpO2: 93% 95%    General: Somnolent, no distress, breathing comfortably.  CV:  Good peripheral perfusion.  Regular rate and rhythm Resp:  Normal effort.  Clear to auscultation bilaterally Abd:  No distention.  Soft nontender Other:  Recent urinary incontinence.  Small abrasion left lateral tongue   ED Results / Procedures / Treatments   Labs (all labs ordered are listed, but only abnormal results are displayed) Labs Reviewed  COMPREHENSIVE METABOLIC PANEL WITH GFR - Abnormal; Notable for the following components:      Result Value   Potassium 3.3 (*)    CO2 21 (*)    BUN 21 (*)    Albumin  3.3 (*)    All other components within normal limits  CBC WITH DIFFERENTIAL/PLATELET - Abnormal; Notable for the following components:   WBC 11.8 (*)    RBC 3.69 (*)    Hemoglobin 11.4 (*)    HCT 34.4 (*)  Neutro Abs 9.0 (*)    Abs Immature Granulocytes 0.24 (*)    All other components within normal limits  ETHANOL  URINE DRUG SCREEN, QUALITATIVE ([facility] ONLY)     EKG Interpreted by me Normal sinus rhythm rate of 95.  Normal axis intervals QRS ST segments and T waves   RADIOLOGY    PROCEDURES:  Procedures   MEDICATIONS ORDERED IN ED: Medications  haloperidol lactate (HALDOL) 5 MG/ML injection (has no administration in time range)  LORazepam  (ATIVAN ) 2 MG/ML injection (has no administration in time range)  sodium chloride  0.9 % bolus 500 mL (500 mLs Intravenous Bolus 12/07/23 1424)  haloperidol lactate (HALDOL) injection 5 mg (5 mg Intravenous Given 12/07/23 1417)  LORazepam  (ATIVAN ) injection 2 mg (2 mg Intravenous Given 12/07/23 1436)  levETIRAcetam  (KEPPRA ) undiluted injection 1,000 mg (1,000 mg Intravenous Given 12/07/23 1440)  LORazepam  (ATIVAN ) injection 2 mg (2 mg Intravenous Given 12/07/23 1504)     IMPRESSION / MDM / ASSESSMENT AND PLAN / ED COURSE  I reviewed the triage vital signs and the  nursing notes.  DDx: Epilepsy, electrolyte derangement, drug abuse, alcohol intoxication  Patient's presentation is most consistent with acute presentation with potential threat to life or bodily function.  Patient arrived somnolent after seizure, received benzodiazepines by EMS.  Maintaining his airway, will observe for improvement in his mental status.  No signs of trauma.   Clinical Course as of 12/07/23 1533  Thu Dec 07, 2023  1432 Patient is awake but postictal and very agitated.  Unable to calm down with IV Haldol, will give IV Ativan  as well. [PS]    Clinical Course User Index [PS] Jacquie Maudlin, MD    ----------------------------------------- 3:32 PM on 12/07/2023 ----------------------------------------- Somnolence again after medications to calm him.  Labs reassuring.  Awaiting UDS, ethanol.  Will need to observe until mental status clears, at which point he will be stable for discharge.   FINAL CLINICAL IMPRESSION(S) / ED DIAGNOSES   Final diagnoses:  Seizure (HCC)     Rx / DC Orders   ED Discharge Orders     None        Note:  This document was prepared using Dragon voice recognition software and may include unintentional dictation errors.   Jacquie Maudlin, MD 12/07/23 [id number]

## 2023-12-07 NOTE — ED Provider Notes (Signed)
  Physical Exam  BP 97/64   Pulse 70   Temp 97.8 F (36.6 C) (Axillary)   Resp 18   SpO2 99%   Physical Exam  Procedures  Procedures  ED Course / MDM   Clinical Course as of 12/07/23 2200  Thu Dec 07, 2023  1432 Patient is awake but postictal and very agitated.  Unable to calm down with IV Haldol, will give IV Ativan  as well. [PS]  1545 S/o from Dr. Vicenta Graft: - 43 year old male with known seizure disorder and medication nonadherence presents for evaluation after seizure.  Received Versed  by EMS, somnolent, woke up and was acting more appropriate and then became somewhat confused and agitated.  No evidence of head trauma.  Received further benzodiazepines.  Plan to reevaluate after metabolization  No further convulsive activity here  TO DO: - re-eval [MM]  2012 Family bedside is noticing that patient seized, fell and hit his head on a desk, requesting CT head given-I believe this is reasonable, CT head ordered. [MM]  2123 CTH: IMPRESSION: No acute intracranial process.   [MM]  2157 Patient reevaluated, AO x 4, calm, cooperative.  States he has not had seizure in over a year.  Is taking his Keppra  as prescribed.  He is at baseline mental state.  Stable for discharge home from my perspective.  Plan for close outpatient neurology follow-up.  Continue Keppra  as already prescribed.  Counseled not to drive until cleared by his neurologist.  ED return precautions in place.  Patient and family agree with plan. [MM]    Clinical Course User Index [MM] Collis Deaner, MD [PS] Jacquie Maudlin, MD   Medical Decision Making Amount and/or Complexity of Data Reviewed Labs: ordered. Radiology: ordered.  Risk Prescription drug management.          Collis Deaner, MD 12/07/23 2200

## 2023-12-07 NOTE — ED Notes (Signed)
 Lab called to ask why UDS has not been run, Lab states they dont have it, this RN called X2 and making lab aware it has been sent, issues with tube system.

## 2023-12-07 NOTE — ED Notes (Signed)
 Pt currently resting calmly at this time. Sitter at bedside.

## 2023-12-12 NOTE — Unmapped (Signed)
 Called patient in response to a VM he left reporting he had a seizure on Thursday.    Patient does not remember much about the seizure. He reports he has been under some stress lately and he thinks that is what caused the breakthrough seizure.  Patient declined referral for MH services.     He was at home on Thursday around lunchtime when he suddenly had a seizure.  His mother saw the seizure and called EMS.  Patient is not sure about the details of the seizure.  He was not aware during the seizure.     Negative workup in the ER.  Discussed adding more potassium rich foods to his diet due to low potassium (3.3). discussed elevated BUN- possibly dehydrated. Patient states he has been urinating a lot lately and has an appointment with urology next week.     It has been awhile since his last seizure.      Confirmed he is taking Keppra 1500 mg BID.     He did not miss any doses of Keppra prior to the seizure on 12/07/23. Per ER note, patient has not been compliant with medication.  Discussed the need to take Keppra 1500 mg twice a day to prevent another seizure.     Discussed seizure safety.   Seizures/ Events may happen at any time. It is important to take certain precautions to maintain your safety:    - When possible, take showers instead of baths, as it is possible to drown in even shallow water during a seizure/event.     -Do not swim unsupervised or in open water where rescue could be difficult.     -Do not climb to heights and do not operate heavy machinery.     -When cooking, use the back burners of the stove and avoid open flames or hot stove tops.     -Avoid any activities which could be dangerous in the event of a loss of consciousness.  This includes no driving until seizure free for 6 months.      Will route to provider.

## 2023-12-13 NOTE — Unmapped (Signed)
 If only they GOT  levetiracetam level so we know how to adjust his medication.      Staff  Can you place patient on wait list for early appt. Thanks  [employer]

## 2023-12-21 DIAGNOSIS — K50819 Crohn's disease of both small and large intestine with unspecified complications: Principal | ICD-10-CM

## 2023-12-21 MED ORDER — RINVOQ 30 MG TABLET,EXTENDED RELEASE
ORAL_TABLET | Freq: Every day | ORAL | 0 refills | 90.00000 days
Start: 2023-12-21 — End: ?

## 2023-12-22 MED ORDER — UPADACITINIB ER 30 MG TABLET,EXTENDED RELEASE 24 HR
ORAL_TABLET | Freq: Every day | ORAL | 0 refills | 90.00000 days | Status: CP
Start: 2023-12-22 — End: ?
  Filled 2024-01-01: qty 30, 30d supply, fill #0

## 2023-12-22 NOTE — Unmapped (Signed)
 Encounter for refill request:  Last clinic visit: 10/31/2023  Colonoscopy:     Lab Results   Component Value Date    WBC 10.8 10/31/2023    RBC 4.21 (L) 10/31/2023    HGB 13.5 10/31/2023    HCT 39.0 10/31/2023    PLT 236 10/31/2023    ALT 29 10/31/2023    AST 33 10/31/2023    ALKPHOS 66 10/31/2023    CRP 21.0 (H) 12/27/2022    CREATININE 0.79 10/31/2023       Rinvoq refills authorized x 3 month supply.

## 2023-12-25 NOTE — Unmapped (Signed)
 [facility] Special[facility] and Home Delivery Pharmacy Refill Coordination Note    Kerry Mooney, DOB: [birth year]  Phone: [phone] (home)       All above HIPAA information was verified with patient.         12/23/2023     8:01 AM   Specialty Rx Medication Refill Questionnaire   Which Medications would you like refilled and shipped? Rinvoq, I have 11 doses left, including today's dose.   Please list all current allergies: none   Have you missed any doses in the last 30 days? No   Have you had any changes to your medication(s) since your last refill? No   How many days remaining of each medication do you have at home? 11, including today   Have you experienced any side effects in the last 30 days? No   Please enter the full address (street address, city, state, zip code) where you would like your medication(s) to be delivered to. [address], [place], [place], [address]   Please specify on which day you would like your medication(s) to arrive. Note: if you need your medication(s) within 3 days, please call the pharmacy to schedule your order at [phone]  01/01/2024   Has your insurance changed since your last refill? No   Would you like a pharmacist to call you to discuss your medication(s)? No   Do you require a signature for your package? (Note: if we are billing Medicare Part B or your order contains a controlled substance, we will require a signature) No   I have been provided my out of pocket cost for my medication and approve the pharmacy to charge the amount to my credit card on file. Yes         Completed refill call assessment today to schedule patient's medication shipment from the [facility] and Home Delivery Pharmacy [phone]).  All relevant notes have been reviewed.       Confirmed patient received a Conservation officer, historic buildings and a Surveyor, mining with first shipment. The patient will receive a drug information handout for each medication shipped and additional FDA Medication Guides as required. REFERRAL TO PHARMACIST     Referral to the pharmacist: Not needed      [facility]     Shipping address confirmed in Epic.     Delivery Scheduled: Yes, Expected medication delivery date: 01/01/24.     Medication will be delivered via Same Day Courier to the prescription address in Epic [place].    Kerry Mooney   [facility] Specialty and Home Delivery Pharmacy Specialty Technician

## 2023-12-27 MED ORDER — TAMSULOSIN 0.4 MG CAPSULE
ORAL_CAPSULE | Freq: Every day | ORAL | 3 refills | 90.00000 days | Status: CP
Start: 2023-12-27 — End: ?

## 2023-12-27 NOTE — Unmapped (Signed)
 Unable to complete refill request, please advise as the patient is outside the 15 months window.  Please review below request.     Patient is requesting the following refill  Requested Prescriptions     Pending Prescriptions Disp Refills    tamsulosin (FLOMAX) 0.4 mg capsule [Pharmacy Med Name: TAMSULOSIN HCL 0.4 MG CAPSULE] 90 capsule 3     Sig: TAKE 1 CAPSULE BY MOUTH EVERY DAY       Recent Visits  No visits were found meeting these conditions.  Showing recent visits within past 365 days and meeting all other requirements  Future Appointments  Date Type Provider Dept   01/16/24 Appointment Bodnar, Coralie Sieving, MD [facility]   Showing future appointments within next 365 days and meeting all other requirements       Labs: Not applicable this refill

## 2023-12-29 NOTE — Unmapped (Signed)
 FAX CONFIRMATION     Orders/documents that were faxed: disability paperwork  Facility/location it was faxed to: [employer]  Phone:   [phone]  Time sent:  Fax confirmation received and document shredded

## 2023-12-29 NOTE — Unmapped (Signed)
 Disability form completed

## 2024-01-05 NOTE — Unmapped (Signed)
 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 Does note having a lot of caffeine in the morning & drinking up until bed. Denies any issues with UTIs or incontinence. Been on oxybutynin  and Flomax  for about 3 years. Symptoms suggest neurogenic bladder from spinal cord injury, possible prostate involvement. I have recommended that the patient undergo urodynamics. We discussed the rationale behind this test as well as what information I hope to obtain from it. We discussed how the test is performed as well as its risks. Discussed switching from oxybutynin  to vesicare, discussed risks/side effects of the medication.   - Discontinue oxybutynin .  - Start solifenacin (Vesicare) once daily.  - Discuss urodynamics testing with colleagues.  - Advise reducing caffeine and managing fluid intake, especially before bedtime.  - Follow up in 3 months.    ______________________________________________________________________    Referring Physician:   Arrie Wanda Rouse, MD  [address]  [place],  [place] [address]    PCP:   [name], MD    Chief Complaint:    HPI:   43 y.o. male seen in consultation at the request of Arrie Wanda Cherry* for night urinary urgency.  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      Mobility: ambulates on his own without assistance     Current Bladder Management:    Cath Schedule: none    Indwelling Catheter:no     Urinary Symptoms  Duration of symptoms: 3 years since SCI  Incontinence:  no notes some issues getting full control of his voiding, but hasn't had any leakage since leaving rehab after his initial injury.   Stress Urinary Incontinence: no   Urgency Urinary Incontinence:  no   Incontinence: triggers: n/a   Wearing pads: n/a  Frequency: yes  Urgency: yes, not as bothersome as frequency   Nocturia: yes  Enuresis: not for a couple years   Incomplete Emptying: sometimes   Post Void Dribbling: some     Male:   IPSS  Incomplete Emptying: 3  Frequency: 5  Intermittency: 3  Urgency: 2  Weak stream: 3  Straining: 3  Nocturia: 5    Total: 24  (0-7 mild, 8-19 moderate, 20-35 severe)    QOL: 5     PVR: 137 mls    Urologic Surgery: no    Urologic Medications: yes, currently taking oxybutynin  5 mg and flomax  daily      Urinary Tract Infections: no, mentions only being treated a couple of times for UTIs    Urodynamics: no    Imaging: no recent upper tract imaging    Labs: normal CMP 10/31/23, normal lipid panel 10/31/23    Erectile Dysfunction: no    Ejaculatory Dysfunction: no    Bowel dysfunction: no, but does note history of Crohns. Recently switched from humira  to Rinvoq  and reports this has been going really well. Going about 1x a day.     Medical History  Past Medical History[1]    Surgical History  Past Surgical History[2]    Social History:  Patient  reports that he has never smoked. He has never used smokeless tobacco. He reports that he does not currently use alcohol. He reports that he does not use drugs.     Family History:  The patient's family history includes Autoimmune disease in his maternal grandmother; Polycystic kidney disease in his sister.     Medications:  Current  Medications[3]    Allergies:  Infliximab     Physical Exam:  BP 117/81 (BP Site: L Arm, BP Position: Sitting)  - Pulse 75  - Ht 175.3 cm (5' 9)  - Wt 67.4 kg (148 lb 9 oz)  - BMI 21.94 kg/m??   General: well developed, well nourished, no acute distress  Psychiatric: alert, well groomed   Neurologic: Grossly intact. Upper body significantly slouched.   HEENT: PERLA, EOM intact, normocephalic, atraumatic  Neck: supple and no masses  Chest: symmetrical  Lungs: non-labored breathing  Heart: normal rate, no JVD  Abdomen: Soft, nontender and non-distended.  GU: Patient deferred.  Extremities: no deformities, no edema, no cyanosis    Labs:   Results for orders placed or performed in visit on 10/31/23   CBC Result Value Ref Range    WBC 10.8 3.6 - 11.2 10*9/L    RBC 4.21 (L) 4.26 - 5.60 10*12/L    HGB 13.5 12.9 - 16.5 g/dL    HCT 60.9 60.9 - 51.9 %    MCV 92.7 77.6 - 95.7 fL    MCH 32.1 25.9 - 32.4 pg    MCHC 34.6 32.0 - 36.0 g/dL    RDW 86.4 87.7 - 84.7 %    MPV 6.9 6.8 - 10.7 fL    Platelet 236 150 - 450 10*9/L   Comprehensive Metabolic Panel   Result Value Ref Range    Sodium 142 135 - 145 mmol/L    Potassium 3.6 3.4 - 4.8 mmol/L    Chloride 104 98 - 107 mmol/L    CO2 27.8 20.0 - 31.0 mmol/L    Anion Gap 10 5 - 14 mmol/L    BUN 16 9 - 23 mg/dL    Creatinine 9.20 9.26 - 1.18 mg/dL    BUN/Creatinine Ratio 20     eGFR CKD-EPI (2021) Male >90 >=60 mL/min/1.55m2    Glucose 118 70 - 179 mg/dL    Calcium 89.8 8.7 - 89.5 mg/dL    Albumin 4.1 3.4 - 5.0 g/dL    Total Protein 7.7 5.7 - 8.2 g/dL    Total Bilirubin 0.4 0.3 - 1.2 mg/dL    AST 33 <=65 U/L    ALT 29 10 - 49 U/L    Alkaline Phosphatase 66 46 - 116 U/L   Ferritin   Result Value Ref Range    Ferritin 242.1 10.5 - 307.3 ng/mL   Iron & TIBC   Result Value Ref Range    Iron 72 65 - 175 ug/dL    TIBC 701 749 - 574 ug/dL    Iron Saturation (%) 24 20 - 55 %   Vitamin B12 Level   Result Value Ref Range    Vitamin B-12 495 211 - 911 pg/ml   Lipid panel, non fasting   Result Value Ref Range    Cholesterol, Total 150 <200 mg/dL    Cholesterol, HDL 69 >40 mg/dL    Cholesterol, LDL, Calculated 68 <100 mg/dL    Cholesterol, Non-HDL, Calculated 81 <130 mg/dL    Triglycerides 92 <849 mg/dL    Fasting Unknown      *Note: Due to a large number of results and/or encounters for the requested time period, some results have not been displayed. A complete set of results can be found in Results Review.             [1]   Past Medical History:  Diagnosis Date    Anemia     Crohn's  colitis        Spondylolysis     Ankylosing spondylitis   [2]   Past Surgical History:  Procedure Laterality Date    HERNIA REPAIR      NECK SURGERY  2016    spinal fusion.has screw in his spine    PR COLONOSCOPY W/BIOPSY SINGLE/MULTIPLE  09/15/2014    Procedure: COLONOSCOPY, FLEXIBLE, PROXIMAL TO SPLENIC FLEXURE; WITH BIOPSY, SINGLE OR MULTIPLE;  Surgeon: Rodger Phlegm, MD;  Location: [facility];  Service: Gastroenterology    PR COLONOSCOPY W/BIOPSY SINGLE/MULTIPLE N/A 10/06/2016    Procedure: COLONOSCOPY, FLEXIBLE, PROXIMAL TO SPLENIC FLEXURE; WITH BIOPSY, SINGLE OR MULTIPLE;  Surgeon: Rodger Phlegm, MD;  Location: [facility];  Service: Gastroenterology    PR COLONOSCOPY W/BIOPSY SINGLE/MULTIPLE N/A 01/04/2021    Procedure: COLONOSCOPY, FLEXIBLE, PROXIMAL TO SPLENIC FLEXURE; WITH BIOPSY, SINGLE OR MULTIPLE;  Surgeon: Rodger Phlegm, MD;  [facility];  Service: Gastroenterology    PR COLONOSCOPY W/BIOPSY SINGLE/MULTIPLE N/A 03/20/2023    Procedure: COLONOSCOPY, FLEXIBLE, PROXIMAL TO SPLENIC FLEXURE; WITH BIOPSY, SINGLE OR MULTIPLE;  Surgeon: Phlegm Rodger, MD;  Location: [facility];  Service: Gastroenterology    PR DEBRIDEMENT MUSCLE &/FASCIA 1ST 20 SQ CM/< Midline 06/18/2021    Procedure: DEBRIDEMENT; SKIN, SUBCUTANEOUS TISSUE, & MUSCLE;  Surgeon: Estefana Melany Devonshire, MD;  Location: MAIN OR [place];  Service: Plastics    PR DEBRIDEMENT SUBCUTANEOUS TISSUE 1ST 20 SQ CM/< Midline 07/19/2021    Procedure: DEBRIDEMENT; SKIN & SUBCUTANEOUS TISSUE TRUNK;  Surgeon: Estefana Melany Devonshire, MD;  Location: MAIN OR [place];  Service: Plastics    PR INCISION & DRAINAGE COMPLEX PO WOUND INFECTION Midline 05/20/2021    Procedure: INCISION & DRAINAGE, COMPLEX, POSTOPERATIVE WOUND INFECTION BACK/PRONE;  Surgeon: Weyman Ephriam Lessen, MD;  Location: MAIN OR [place];  Service: Neurosurgery    PR IONM 1 ON 1 IN OR W/ATTENDANCE EACH 15 MINUTES N/A 04/25/2014    Procedure: CONTINUOUS INTRAOPERATIVE NEUROPHYSIOLOGY MONITORING IN OR;  Surgeon: Stanly Glow, MD;  Location: MAIN OR [place];  Service: Neurosurgery    PR MUSCLE-SKIN FLAP,TRUNK Midline 05/24/2021    Procedure: MUSCLE, MYOCUTANEOUS, OR FASCIOCUTANEOUS FLAP; TRUNK;  Surgeon: Estefana Melany Devonshire, MD;  Location: MAIN OR [place];  Service: Plastics    PR ODONTOID,FX,GRAFTING,OPEN Midline 04/25/2014    Procedure: OPEN TREATMENT &/OR REDCUCTION ODONTOID FX/DISLOCATION, ANTERIOR APPROACH, INTERNAL FIXATION, WITH GRAFT;  Surgeon: Stanly Glow, MD;  Location: MAIN OR [place];  Service: Neurosurgery    PR SURG DIAGNOSTIC EXAM, ANORECTAL N/A 01/21/2021    Procedure: ANORECTAL EXAM, SURGICAL, REQUIRING ANESTHESIA (GENERAL, SPINAL, OR EPIDURAL), DIAGNOSTIC;  Surgeon: Fara Murlene Ransom, MD;  Location: MAIN OR [place];  Service: Gastrointestinal    PR SURG EXCISION OF ANAL LESION(S) N/A 01/21/2021    Procedure: DESTRUCTION OF LESION(S), ANUS (EG, CONDYLOMA, PAPILLOMA, MOLLUSCUM CONTAGIOSUM) SIMPLE; SURGICAL EXCISION;  Surgeon: Fara Murlene Ransom, MD;  Location: MAIN OR [place];  Service: Gastrointestinal    TONSILLECTOMY     [3]   Current Outpatient Medications   Medication Sig Dispense Refill    ascorbic acid, vitamin C, (VITAMIN C) 500 MG tablet Take 1 tablet (500 mg total) by mouth daily.      busPIRone  (BUSPAR ) 15 MG tablet TAKE 1 TABLET (15 MG TOTAL) BY MOUTH THREE (3) TIMES A DAY. 270 tablet 1    ferrous sulfate 325 (65 FE) MG tablet Take 1 tablet (325 mg total) by mouth in the morning.      levETIRAcetam  (KEPPRA ) 750 MG tablet Take  2 tablets (1,500 mg total) by mouth two (2) times a day. 360 tablet 3    oxybutynin  (DITROPAN ) 5 MG tablet Take 1 tablet (5 mg total) by mouth Three (3) times a day. (Patient taking differently: Take 1 tablet (5 mg total) by mouth in the morning.) 270 tablet 3    tamsulosin  (FLOMAX ) 0.4 mg capsule TAKE 1 CAPSULE BY MOUTH EVERY DAY 90 capsule 3    upadacitinib  (RINVOQ ) 30 mg tablet Take 1 tablet (30 mg total) by mouth daily. 90 tablet 0     No current facility-administered medications for this visit.

## 2024-01-08 ENCOUNTER — Ambulatory Visit: Admit: 2024-01-08 | Discharge: 2024-01-09 | Payer: BLUE CROSS/BLUE SHIELD

## 2024-01-08 DIAGNOSIS — N319 Neuromuscular dysfunction of bladder, unspecified: Principal | ICD-10-CM

## 2024-01-08 DIAGNOSIS — R3915 Urgency of urination: Principal | ICD-10-CM

## 2024-01-08 DIAGNOSIS — S14151A Other incomplete lesion at C1 level of cervical spinal cord, initial encounter: Principal | ICD-10-CM

## 2024-01-08 MED ORDER — SOLIFENACIN 10 MG TABLET
ORAL_TABLET | Freq: Every day | ORAL | 11 refills | 30.00000 days | Status: CP
Start: 2024-01-08 — End: 2025-01-07

## 2024-01-18 NOTE — Unmapped (Signed)
Disability form completed and faxed.

## 2024-01-23 NOTE — Unmapped (Signed)
 [facility] Specialty and Home Delivery Pharmacy Refill Coordination Note    Kerry Mooney, DOB: 09-Apr-1981  Phone: [phone] (home)       All above HIPAA information was verified with patient.         01/23/2024     9:38 AM   Specialty Rx Medication Refill Questionnaire   Which Medications would you like refilled and shipped? Rinvoq , I have 9 days supply, including today.   Please list all current allergies: none   Have you missed any doses in the last 30 days? No   Have you had any changes to your medication(s) since your last refill? No   How much of each medication do you have remaining at home? (eg. number of tablets, injections, etc.) 9 pills   Have you experienced any side effects in the last 30 days? No   Please enter the full address (street address, city, state, zip code) where you would like your medication(s) to be delivered to. [address] [place], [place] [address]   Please specify on which day you would like your medication(s) to arrive. Note: if you need your medication(s) within 3 days, please call the pharmacy to schedule your order at [phone]  01/30/2024   Has your insurance changed since your last refill? No   Would you like a pharmacist to call you to discuss your medication(s)? No   Do you require a signature for your package? (Note: if we are billing Medicare Part B or your order contains a controlled substance, we will require a signature) No   I have been provided my out of pocket cost for my medication and approve the pharmacy to charge the amount to my credit card on file. Yes         Completed refill call assessment today to schedule patient's medication shipment from the [facility] and [facility] Delivery Pharmacy [phone]).  All relevant notes have been reviewed.       Confirmed patient received a Conservation officer, historic buildings and a Surveyor, mining with first shipment. The patient will receive a drug information handout for each medication shipped and additional FDA Medication Guides as required.         REFERRAL TO PHARMACIST     Referral to the pharmacist: Not needed      [facility]     Shipping address confirmed in Epic.     Delivery Scheduled: Yes, Expected medication delivery date: 01/30/24.     Medication will be delivered via Next Day Courier to the prescription address in Epic [place].    Kerry Mooney   [facility] Specialty and Home Delivery Pharmacy Specialty Technician

## 2024-01-29 MED FILL — RINVOQ 30 MG TABLET,EXTENDED RELEASE: ORAL | 30 days supply | Qty: 30 | Fill #1

## 2024-02-08 NOTE — Unmapped (Addendum)
 Called [place] CHRISTELLA Kitty and left patient message to call the [facility] back to reschedule the below appt(s):    Provider: Delon Eans  Type of Appt: Return urology on 04/08/24     Asked the patient to call back at [phone].    Thanks,  Winford Nay    Artera message sent 02/22/24  MyChart message sent 02/23/24

## 2024-02-19 ENCOUNTER — Ambulatory Visit: Admit: 2024-02-19 | Discharge: 2024-02-19 | Payer: BLUE CROSS/BLUE SHIELD

## 2024-02-19 DIAGNOSIS — G40909 Epilepsy, unspecified, not intractable, without status epilepticus: Principal | ICD-10-CM

## 2024-02-19 LAB — KEPPRA (LEVETIRACETAM): KEPPRA: 30.1 ug/mL (ref 6.0–46.0)

## 2024-02-19 MED ORDER — LEVETIRACETAM 750 MG TABLET
ORAL_TABLET | ORAL | 3 refills | 0.00000 days | Status: CP
Start: 2024-02-19 — End: ?

## 2024-02-19 NOTE — Unmapped (Signed)
 Assessment:   Kerry Mooney is a 43 y.o. right handed male who is being evaluated for recent breakthrough seizure.  His last seizures were on 05/09/2022 feeling dazed.  Patient had a convulsion on 12/07/2023 in the setting of his cat's death and having to care for his mother    The patient has a history of childhood seizures treated with phenobarbital from 19 months for age for 2 years. He had not further seizures until March 2016.  He reports hitting his head prior to seizure recurrence in October 2015. He then progressed to have 4 seizures.     A routine EEG (08/2022) showed left temporal transients F7> T 3.     Seizure Type: onset in childhood and recurred on 09/2014  Seizure Etiology: unknown    Previous Seizure Medications:PB-treat from 19 ms to 43 yrs old.       Plan:   I personally spent 30 minutes face-to-face and non-face-to-face in the care of this patient, which includes all pre, intra, and post visit time on the date of service.  All documented time was specific to the E/M visit and does not include any procedures that may have been performed.    Seizure prevention plan  Increase-Keppra  750 mg tab- take 2 tabs in am and 2.5 tabs in pm - last dose 5am.   Rescue: patient decline as he has no history for prolong or cluster seizures.      Labs 10/2023 reviewed. Hematology and Chemistries were normal. Levetiracetam  level on 12/28/2022 was 40.7 at 11:27 am     We discussed my impressions regarding the patient???s findings, including diagnosis, test results, and implications on future health, effects and side effects of present and future potential medications, as well as further testing and medications required.  - Issues discussed also included safety factors related to driving, swimming and bathing, heights, machinery, burns. We discussed interactions between seizure medications and suicidal thoughts and suicide.   -We discussed future options including increase levetiracetam  versus adding on a second agent versus evaluation for neurostimulator device or ablative surgery for seizure if he meets the criteria for intractable epilepsy    Return to clinic for follow up in 6-9 months    Epilepsy New HPI     HPI: Mr. Kerry Mooney is a 43 y.o. old right handed male  has a past medical history of Anemia, Crohn's colitis, and Spondylolysis.The patient reports to have good seizure control. No seizure since last visit.   On 10/06/2014: The  patient has chron's disease and while at [facility] for first dose of Remicade, he was noted to be unresponsive, undergoing grand mal seizure (unclear duration, likely <5 min), per notes. The patient had vomited on himself and was incontinent of urine. He was noted to have post-ictal confusion and sore, otherwise he feels back to baseline. This was believed to be a provoked seizures   12/09/2014- Mom witnessed GTC lasting 5 minutes. Levetiracetam  500mg  BID started.  07/12/2020 GTC and 07/2021-presumed focal seizure-feeling dazed without generalized tonic-clonic    Seizure risk factors: The was the product of an uncomplicated pregnancy and delivery.  The patient had a normal development.There a history febrile seizure as an infant or child with phenobarbital used from 19 months for a total of 2 years.  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  There is no family history of seizures or epilepsy. Positive head injury in  2015 from a fall.  Scoliosis.      Onset: The patient had his first seizure as an infant believed to be febrile and encephalitis related.    Number of seizure types: One  Seizure/seizure-like event  description:   - Aura: No clear warning,   - Semiology: Makes a sound, then whole body shaking, eyes open, drool, bites tongue and unclear UI. This lasts about 2-3 minutes, has a post ictal period long time.   Frequency: As a child 19 months, on meds for 2 years, no seizures until 09/2014. Then sporadic 10/2014, 11/2014, 07/2020, 11/2020, 04/2021, 07/2021, and most recently  05/09/2022  Provoking Features: stress not eating.   Work up done   EEG: 08/30/2022- Interictal Activity:  - Occasional sharp transients in the left temporal region, maximal at F7>T3.   vEEG/ EMU evaluation: No   Brain MRI imaging: in 2016 was normal.   Brain MRI imaging 03/03/2022 was normal.      Since last visit  History of Present Illness  Kerry Mooney is a 43 year old male with a history of seizures who presents for follow-up after a seizure in May 2025.    He has a history of seizures and is currently on Keppra  1500 mg twice daily. His last seizure occurred on 2023-12-15, coinciding with the death of his cat, a stressful event. He does not recall the events surrounding the seizure but remembers leaving the ER with his father. His mother witnessed the seizure and was frightened by it. Prior to this, his last seizure was in October 2023.    He experiences ongoing symptoms of depression, anxiety, low energy, and weakness in his left hand. 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 He is currently taking Buspar  for mood, but he is unsure of its effectiveness. His anxiety and depression scores are high, with a 13 on the anxiety scale and a 12 on the depression scale. Patient decline establishing mental health support    His mother is on dialysis four times a week, and his father has emphysema. He is involved in caregiving for his mother, handling tasks such as cooking, cleaning, and laundry. His mother has some assistance from a nurse and in-home physical therapy. His sleep schedule is irregular, often waking up early after falling asleep in the evening. He is also undergoing urological evaluation due to frequent nighttime urination.    Recent labs from April 2025 were reviewed. He takes an iron supplement regularly. He is currently in the process of appealing disability paperwork with the help of a legal group.       PMH:   Past Medical History:   Diagnosis Date    Anemia     Crohn's colitis        Spondylolysis     Ankylosing spondylitis         FMH:   Family History   Problem Relation Age of Onset    Autoimmune disease Maternal Grandmother         Psoriasis    Polycystic kidney disease Sister     Anesthesia problems Neg Hx     Ulcerative colitis Neg Hx     Crohn's disease Neg Hx     Colorectal Cancer Neg Hx     Pancreatic cancer Neg Hx     Esophageal cancer Neg Hx          SMH:  Social History     Socioeconomic History    Marital status: Single     Spouse name: None    Number of children: None    Years of education: None    Highest education level: None   Tobacco Use    Smoking status: Never    Smokeless tobacco: Never   Vaping Use    Vaping status: Never Used   Substance and Sexual Activity    Alcohol use: Not Currently     Comment: rare    Drug use: No   Social History Psychologist, educational, heavy labor.      Social Drivers of Psychologist, prison and probation services Strain: Low Risk  (06/17/2021)    Overall Financial Resource Strain (CARDIA)     Difficulty of Paying Living Expenses: Not hard at all   Food Insecurity: No Food Insecurity (06/17/2021)    Hunger Vital Sign     Worried About Running Out of Food in the Last Year: Never true     Ran Out of Food in the Last Year: Never true   Transportation Needs: No Transportation Needs (08/05/2021)    PRAPARE - Therapist, art (Medical): No     Lack of Transportation (Non-Medical): No   Recent Concern: Transportation Needs - Unmet Transportation Needs (06/01/2021)    PRAPARE - Therapist, art (Medical): Yes     Lack of Transportation (Non-Medical): Yes         Allergies: Infliximab      Medications:   Current Outpatient Medications   Medication Sig Dispense Refill    ascorbic acid, vitamin C, (VITAMIN C) 500 MG tablet Take 1 tablet (500 mg total) by mouth daily. busPIRone  (BUSPAR ) 15 MG tablet TAKE 1 TABLET (15 MG TOTAL) BY MOUTH THREE (3) TIMES A DAY. 270 tablet 1    ferrous sulfate 325 (65 FE) MG tablet Take 1 tablet (325 mg total) by mouth in the morning.      solifenacin  (VESICARE ) 10 MG tablet Take 1 tablet (10 mg total) by mouth daily. 30 tablet 11    tamsulosin  (FLOMAX ) 0.4 mg capsule TAKE 1 CAPSULE BY MOUTH EVERY DAY 90 capsule 3    upadacitinib  (RINVOQ ) 30 mg tablet Take 1 tablet (30 mg total) by mouth daily. 90 tablet 0    levETIRAcetam  (KEPPRA ) 750 MG tablet Take 2 tabs in am and 2.5 tabs in pm. 405 tablet 3     No current facility-administered medications for this visit.         ROS: Full 10 Points of Review of System are reviewed and negative except stated above in HPI  PMH, surgical History, FMH, Medications and Allergies were reviewed in Epic and updated    Physical Examination:  Vital Sign: Blood pressure 115/70, pulse 88, height 175.3 cm (5' 9), weight 67.6 kg (149 lb).  PHQ9_ 7  last visit.  12 today  GAD7- 8 last visit 13 today    General: No acute distress, Well developed/groomed/nourished   Eyes: Pupil equal round reactive to light and accomodation. Extra occular muscles intact, and sclera clear   ENT: Well hydrated moist mucous membrane. Oropharhynx without any erythema or exudate.   Skin: No rash/lesions/breakdown   Psychiatry : appropriate    Extremities: No cyanosis, clubbing or edema.   Musculo Skeletal:No joint tenderness, cervical spine deformity.     Neurological:   Mental Status:  Alert and  Oriented to person,place or date and time. Clear speech, intact naming and repetition, follows commands well, normal fund of knowledge and attention span.   Cranial Nerves: II-XII intact.  EOMI.no nystagmus noted. Symmetrical facial sensation and movement, hearing, palatal rise, tongue protrusion and shoulder shrugs.   Motor: normal bulk and tone throughout. 5/5 Strength bilateral on all four extremities.  Slightly weaker left hand grip versus the right.  Sensory: intact to light touch and temperature throughout   Coordination: no tremors noted   mild dysmetria noted with finger-nose-finger in right hand.   Gait:Romberg was impaired with mild swaying.  Gait is stablewith stoop posture.  able to balance self on left leg but wobbly in right.

## 2024-02-20 NOTE — Unmapped (Signed)
 [facility] Refill Coordination Note    Kerry Mooney, DOB: June 10, 1981  Phone: [phone] (home)       All above HIPAA information was verified with patient.         02/20/2024    10:35 AM   Specialty Rx Medication Refill Questionnaire   Which Medications would you like refilled and shipped? Rinvoq , I have 11days supply on hand, including today.   Please list all current allergies: none   Have you missed any doses in the last 30 days? No   Have you had any changes to your medication(s) since your last refill? No   How much of each medication do you have remaining at home? (eg. number of tablets, injections, etc.) 11 pills, including today's dose   Have you experienced any side effects in the last 30 days? No   Please enter the full address (street address, city, state, zip code) where you would like your medication(s) to be delivered to. [address], [place], [place] [address]   Please specify on which day you would like your medication(s) to arrive. Note: if you need your medication(s) within 3 days, please call the pharmacy to schedule your order at [phone]  02/28/2024   Has your insurance changed since your last refill? No   Would you like a pharmacist to call you to discuss your medication(s)? No   Do you require a signature for your package? (Note: if we are billing Medicare Part B or your order contains a controlled substance, we will require a signature) No   I have been provided my out of pocket cost for my medication and approve the pharmacy to charge the amount to my credit card on file. Yes         Completed refill call assessment today to schedule patient's medication shipment from the [facility] [phone]).  All relevant notes have been reviewed.       Confirmed patient received a Conservation officer, historic buildings and a Surveyor, mining with first shipment. The patient will receive a drug information handout for each medication shipped and additional FDA Medication Guides as required.         REFERRAL TO PHARMACIST     Referral to the pharmacist: Not needed      [facility]     Shipping address confirmed in Epic.     Delivery Scheduled: Yes, Expected medication delivery date: 02/28/24.     Medication will be delivered via Next Day Courier to the prescription address in Epic [place].    Kerry Mooney   [facility] Specialty Technician

## 2024-02-21 NOTE — Unmapped (Signed)
 Urodynamics Triage Note    Referring Provider: PA Nusbaum    Date of Study: 02/26/24    __________________________________________________________________________    Indications: 43 y.o. male with history of C2 ASIA D SCI presenting with irritative LUTS.      Fill rate: 20-30 mL/sec        ___________________________________________________________________________    ANTIBIOTIC PROPHYLAXIS FOR URODYNAMICS    Antibiotic prophylaxis indicated: no

## 2024-02-26 ENCOUNTER — Ambulatory Visit: Admit: 2024-02-26 | Discharge: 2024-02-27 | Payer: BLUE CROSS/BLUE SHIELD

## 2024-02-26 DIAGNOSIS — R3915 Urgency of urination: Principal | ICD-10-CM

## 2024-02-26 DIAGNOSIS — N319 Neuromuscular dysfunction of bladder, unspecified: Principal | ICD-10-CM

## 2024-02-26 DIAGNOSIS — S14151A Other incomplete lesion at C1 level of cervical spinal cord, initial encounter: Principal | ICD-10-CM

## 2024-02-26 MED ORDER — CEFDINIR 300 MG CAPSULE
ORAL_CAPSULE | Freq: Two times a day (BID) | ORAL | 0 refills | 7.00000 days | Status: CP
Start: 2024-02-26 — End: ?

## 2024-02-26 MED ADMIN — diatrizoate meglumine (HYPAQUE) 30 % solution 300 mL: 300 mL | URETHRAL | @ 19:00:00 | Stop: 2024-02-26

## 2024-02-26 MED ADMIN — cephalexin (KEFLEX) capsule 500 mg: 500 mg | ORAL | @ 19:00:00 | Stop: 2024-02-26

## 2024-02-26 NOTE — Unmapped (Addendum)
 [facility]:  Taking Care of Yourself After Cystoscopy/Urodynamics Procedures    *Drink plenty of water  for a day or two following your procedure.  Try to have about 8 ounces (one cup) at a time, and do this 6 times or more per day.  (If you have fluid restrictions, please ask the nurse or doctor for advice).    *AVOID alcoholic, carbonated and caffeinated drinks for a day or two, as they may cause uncomfortable symptoms.    *For the first 8 hours after the procedure, your urine may be pink or red in color.  Small clots or a few drops of blood can be a normal side effect of the instruments.  Large amounts of bleeding or difficulty urinating are not normal.  Call your doctor if this happens.    *You may experience some mild discomfort of a burning sensation with urination after having this procedure.  If it does not improve, or if other symptoms appear (fever, chills, or difficulty emptying), call your doctor.    *You may return to normal daily activities such as work, school, driving, exercising and housework.    *If your doctor gave you a prescription, take it as ordered.    *If you need a return appointment, the secretary will make it for you when you check out. To contact the Urology Clinic during business hours, call [phone].    [facility] Operator can be reached at [phone] if you need to get in contact with your doctor.  After the hours, the operator can page the doctor on call for urgent concerns:    Urology Patients should ask for the Urology resident ???on call???.    You can get more immediate assistance at the Emergency Room or Urgent Care if necessary.

## 2024-02-26 NOTE — Unmapped (Signed)
 43 y.o. male with history of  C2 ASIA D SCI who I am seeing today at the request of PA Nusbaum for evaluation of urinary urgency/frequency.        Voiding diary: none completed      Procedure Description  Informed consent obtained prior to the start of the procedure.         PROCEDURE:    A urinalysis was performed to exclude infection, UA suggestive of UTI, though patient denies dysuria or fever/chills, decision was made to proceed with VUDS. The patient was then catheterized to measure the residual urine.   A 3F dual lumen urodynamics catheter was placed under sterile conditions into the patient???s bladder. A 3F catheter was placed into the rectum in order to measure abdominal pressure. EMG patches were placed in the appropriate position.  All connections were confirmed and calibrations/adjusted made.  Contrast was instilled into the bladder through our dual lumen catheters. Periodic fluoroscopic images were taken during filling & voiding for the cystogram/VCUG portion of the examination.  The patient was able to void and the bladder was then emptied of its residual.    RESULTS     UROFLOW:  Revealed a Qmax of  8.51mL/sec.  He voided 56 mL and had a residual of  65 mL.  It was a normal pattern and represented normal habits.      CMG:   This was performed with Contrast in the sitting position at a fill rate of 10 mL/min.      FILL/STORAGE:   Capacity: Strong urge was 72.4  mL and capacity was 144  mL.     Compliance:  normal. End fill detrusor pressure was 3 cmH2O.  Calculated compliance was 48 mL/cmH2O     Detrusor overactivity was seen.  Volume at first detrusor contraction was 72mL.   Detrusor overactivity incontinence was not demonstrated.     Stress incontinence was not demonstrated on this study.      MICTURITION STUDY:  Patient voided from NDO event        FLUOROSCOPY  Cystogram: Smooth bladder wall contours, closed BN during filling, left VUR grade 2  Voiding Cystourethrogram: during void from NDO event-wide open BN with appropriate fill or proximal urethra, left VUR grade 2.      EMG:   This was performed with patches.  He had voluntary contractions.  EMG signal did not show noticeable change during voiding.          ASSESSMENT:   43 y.o. male with history of cervical SCI who presents with worsening LUTs in the last 5 months. UA today suggestive of UTI, though patient not reporting any UTI symptoms, urodynamics findings of early onset NDO with VUR make me suspect underlying UTI/cystitis which may skew urodynamics results. Will plan to repeat VUDS next month after treating for possible UTI.       1. Sensation: increased   2. Capacity: decreased    3. Compliance: normal   4. Stress Incontinence:  not observed   5. Detrusor Overactivity:  observed   6. Emptying:  incomplete   7. Outlet:  unobstructed          PLAN:   Urine sent for culture.  Post procedural antibiotics were administered.  Plan for repeat VUDS next month.      Fluoro time: 12 seconds

## 2024-02-27 MED FILL — RINVOQ 30 MG TABLET,EXTENDED RELEASE: ORAL | 30 days supply | Qty: 30 | Fill #2

## 2024-02-28 DIAGNOSIS — N3000 Acute cystitis without hematuria: Principal | ICD-10-CM

## 2024-02-28 MED ORDER — CEPHALEXIN 500 MG CAPSULE
ORAL_CAPSULE | Freq: Three times a day (TID) | ORAL | 0 refills | 7.00000 days | Status: CP
Start: 2024-02-28 — End: ?

## 2024-03-13 NOTE — Unmapped (Signed)
 RHEUMATOLOGY NEW CLINIC NOTE    Assessment/Plan:  Kerry Mooney is a 43 y.o. male with PMH of seizure disorder, ankylosing spondylitis, and Crohn's disease. His ankylosing spondylitis and Crohn's disease were diagnosed in 2016 in the setting of weight loss and GI symptoms. History of 1-2 episodes of iritis. Did not tolerate infliximab due to seizure. He has been following with Dr. Darrick (GI) for his IBD. He was on humira  and azathioprine  since 2016. He has complex MSK anatomy with history of fall from attic on 04/11/21 with C2-3 fracture and L1 chance fracture who underwent C2-4 and T12-L3 posterior decompression and fusion (10/2) c/b thoracolumbar wound infection and dehiscence s/p debridement, closure with bilateral trapezius flaps 11/14. C/b seroma, cx grew serratia and MRSA. Patient re-referred to rheumatology for joint management with GI of Crohn's disease and ankylosing spondylitis. 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 Interestingly, no antibodies to adalimumab  were present. Therapy was switched to rinvoq  and his weight is up, GI symptoms well-controlled and psoriasis resolved. His joints are stiff but not swollen.  Assessment & Plan  Crohn's disease involving small and large intestine  Crohn's disease is well-managed with Rinvoq , leading to significant improvement in gastrointestinal symptoms. He reports solid stools and weight gain, indicating effective control of the disease.  A colonoscopy is planned to assess internal healing, as previous treatment with Humira  managed symptoms but did not promote healing.  - labs today to monitor Rinvoq  therapy.    Ankylosing spondylitis of spine  Ankylosing spondylitis is managed with Rinvoq , which appears to be effective. He reports stiffness in joints, particularly in the hands, knees, and ankles, but no significant swelling. The stiffness may be due to damage rather than active inflammation.  - Encourage starting a walking program to alleviate joint stiffness.  - Consider alternative exercises like biking or rowing to reduce impact on the spine.    Psoriasis  Psoriasis symptoms have improved since switching to Rinvoq , with only mild dry skin remaining.      RTC 1 year    History of Present Illness:  The patient was seen in consultation at the request of Kerry Mooney  for the evaluation of ankylosing spondylitis and IBD    Primary Care Provider: [name], MD    Kerry Mooney is a 43 y.o.  male last seen in this division in 2018. Has been taking azathioprine  and humira  for years. Referred back from GI.     Hx of fall from attic on 04/11/21 with C2-3 fracture and L1 chance fracture who underwent C2-4 and T12-L3 posterior decompression and fusion (10/2) c/b thoracolumbar wound infection and dehiscence s/p debridement, closure with bilateral trapezius flaps 11/14. C/b seroma, cx grew serratia and MRSA.    On discussion with the patient today, he states that he was originally diagnosed with IBD and ankylosing spondylitis in 2016 after significant weight loss. Treatment was started in 2016. His symptoms throughout the course of his disease included weight loss, difficulty eating, abdominal pain and cramping, diarrhea. He has had back pain since he was in middle school. He has had iritis 1-2 times about 10 years ago which required steroid eye drops.     He has gone up on humira  from once per month to 40 mg once every week over the course of the last year. He is taking 150 mg of azathioprine  daily.  He adds that his hands are more sore than he was in the past. His back and legs are more sore than previously.     He states that he has had some rashes on his lower extremities. His PCP put him on creams. He is on a triamcinolone  ointment and fluocinonide  solution. He has noticed improvement with these ointments. He started to notice a rash on his lower extremities, upper extremities, and scalp. It is not painful. The rash is dry and itchy.     He states that his weight has stabilized. He experiences abdominal pain and cramping depending on what he eats and he states that this is stable. He does continue to have some loose stools. He has about 1 bowel movement per day. No blood in his stool. No eye pain, redness, vision changes, further episodes of iritis since his 30s. He continues to have joint pain. This is located in his back, shoulders, knees, hips, and bilateral ankles.   Pain is the worst in his lower back and his bilateral shoulders. He has morning stiffness which lasts about 30 minutes. His pain improves with activity. Rest worsens his pain. He has not noticed any swelling. He is able to complete all of his ADLs.     History of Present Illness  Kerry Mooney is a 43 year old male with Crohn's disease, ankylosing spondylitis and TNF induced psoriasis who presents for a rheumatology follow-up.    He is currently taking Rinvoq  30 mg, which has significantly improved his gastrointestinal symptoms related to Crohn's disease. He now has solid stools and has gained weight, reaching over 150 pounds for the first time in approximately 15 years. His psoriasis has also improved, with less dry skin than before.    He recently experienced a urinary tract infection and is undergoing urological evaluation due to frequent urination, which may be related to his prostate and fused spine. He underwent a urodynamic exam and was treated with antibiotics for the infection. Another urodynamic test is scheduled at the end of the month.    Regarding his joint symptoms, he experiences stiffness and achiness. His hands are less functional, affecting his ability to carry items confidently with the left. His knees and ankles are stiff but not noticeably swollen. He remains active around the house but does not engage in specific exercise routines.    He had a significant seizure in May, the first in a couple of years, leading to a slight increase in his Keppra  dosage. He now takes 1500 mg in the morning and at night, with an additional half pill in the evening.    His sleep is generally undisturbed by pain, although he wakes up early and frequently at night to urinate, which he attributes to his urological issues. He goes to bed early and wakes up around 3 or 4 AM, feeling that his sleep schedule has shifted rather than being insufficient.    He has a history of spinal surgeries, including a titanium screw placement in 2015 after a neck fracture and subsequent surgeries following a fall in 2022. He is followed by a rehabilitation doctor for his spinal issues.     Allergies:  Infliximab    Medications:   Outpatient Medications Prior to Visit   Medication Sig Dispense Refill    ascorbic acid, vitamin C, (VITAMIN C) 500 MG tablet Take 1 tablet (500 mg total) by mouth daily.      busPIRone  (BUSPAR ) 15 MG tablet TAKE 1 TABLET (15 MG  TOTAL) BY MOUTH THREE (3) TIMES A DAY. 270 tablet 1    cephalexin  (KEFLEX ) 500 MG capsule Take 1 capsule (500 mg total) by mouth Three (3) times a day. Take with food. 21 capsule 0    ferrous sulfate 325 (65 FE) MG tablet Take 1 tablet (325 mg total) by mouth in the morning.      levETIRAcetam  (KEPPRA ) 750 MG tablet Take 2 tabs in am and 2.5 tabs in pm. 405 tablet 3    solifenacin  (VESICARE ) 10 MG tablet Take 1 tablet (10 mg total) by mouth daily. 30 tablet 11    tamsulosin  (FLOMAX ) 0.4 mg capsule TAKE 1 CAPSULE BY MOUTH EVERY DAY 90 capsule 3    upadacitinib  (RINVOQ ) 30 mg tablet Take 1 tablet (30 mg total) by mouth daily. 90 tablet 0     No facility-administered medications prior to visit.     Medical History:  Past Medical History:   Diagnosis Date    Anemia     Crohn's colitis    (CMS-HCC)     Spondylolysis     Ankylosing spondylitis     Surgical History:  Past Surgical History:   Procedure Laterality Date    HERNIA REPAIR      NECK SURGERY  2016    spinal fusion.has screw in his spine    PR COLONOSCOPY W/BIOPSY SINGLE/MULTIPLE  09/15/2014    Procedure: COLONOSCOPY, FLEXIBLE, PROXIMAL TO SPLENIC FLEXURE; WITH BIOPSY, SINGLE OR MULTIPLE;  Surgeon: Rodger Phlegm, MD;  Location: GI PROCEDURES [place] [facility];  Service: Gastroenterology    PR COLONOSCOPY W/BIOPSY SINGLE/MULTIPLE N/A 10/06/2016    Procedure: COLONOSCOPY, FLEXIBLE, PROXIMAL TO SPLENIC FLEXURE; WITH BIOPSY, SINGLE OR MULTIPLE;  Surgeon: Rodger Phlegm, MD;  Location: GI PROCEDURES [place] [facility];  Service: Gastroenterology    PR COLONOSCOPY W/BIOPSY SINGLE/MULTIPLE N/A 01/04/2021    Procedure: COLONOSCOPY, FLEXIBLE, PROXIMAL TO SPLENIC FLEXURE; WITH BIOPSY, SINGLE OR MULTIPLE;  Surgeon: Rodger Phlegm, MD;  Location: GI PROCEDURES [place] [facility];  Service: Gastroenterology    PR COLONOSCOPY W/BIOPSY SINGLE/MULTIPLE N/A 03/20/2023    Procedure: COLONOSCOPY, FLEXIBLE, PROXIMAL TO SPLENIC FLEXURE; WITH BIOPSY, SINGLE OR MULTIPLE;  Surgeon: Phlegm Rodger, MD;  Location: GI PROCEDURES [place] [facility];  Service: Gastroenterology    PR DEBRIDEMENT MUSCLE &/FASCIA 1ST 20 SQ CM/< Midline 06/18/2021    Procedure: DEBRIDEMENT; SKIN, SUBCUTANEOUS TISSUE, & MUSCLE;  Surgeon: Estefana Melany Devonshire, MD;  Location: MAIN OR [place];  Service: Plastics    PR DEBRIDEMENT SUBCUTANEOUS TISSUE 1ST 20 SQ CM/< Midline 07/19/2021    Procedure: DEBRIDEMENT; SKIN & SUBCUTANEOUS TISSUE TRUNK;  Surgeon: Estefana Melany Devonshire, MD;  Location: MAIN OR [place];  Service: Plastics    PR INCISION & DRAINAGE COMPLEX PO WOUND INFECTION Midline 05/20/2021    Procedure: INCISION & DRAINAGE, COMPLEX, POSTOPERATIVE WOUND INFECTION BACK/PRONE;  Surgeon: Weyman Ephriam Lessen, MD;  Location: MAIN OR [place];  Service: Neurosurgery    PR IONM 1 ON 1 IN OR W/ATTENDANCE EACH 15 MINUTES N/A 04/25/2014    Procedure: CONTINUOUS INTRAOPERATIVE NEUROPHYSIOLOGY MONITORING IN OR;  Surgeon: Stanly Glow, MD;  Location: MAIN OR [place];  Service: Neurosurgery    PR MUSCLE-SKIN FLAP,TRUNK Midline 05/24/2021    Procedure: MUSCLE, MYOCUTANEOUS, OR FASCIOCUTANEOUS FLAP; TRUNK;  Surgeon: Estefana Melany Devonshire, MD;  Location: MAIN OR [place];  Service: Plastics    PR ODONTOID,FX,GRAFTING,OPEN Midline 04/25/2014    Procedure: OPEN TREATMENT &/OR REDCUCTION ODONTOID FX/DISLOCATION, ANTERIOR APPROACH, INTERNAL FIXATION, WITH GRAFT;  Surgeon: Stanly Glow, MD;  Location: MA[facility] OR [facility];  Service: Neurosurgery  PR SURG DIAGNOSTIC EXAM, ANORECTAL N/A 01/21/2021    Procedure: ANORECTAL EXAM, SURGICAL, REQUIRING ANESTHESIA (GENERAL, SPINAL, OR EPIDURAL), DIAGNOSTIC;  Surgeon: Fara Murlene Ransom, MD;  Location: MAIN OR [place];  Service: Gastrointestinal    PR SURG EXCISION OF ANAL LESION(S) N/A 01/21/2021    Procedure: DESTRUCTION OF LESION(S), ANUS (EG, CONDYLOMA, PAPILLOMA, MOLLUSCUM CONTAGIOSUM) SIMPLE; SURGICAL EXCISION;  Surgeon: Fara Murlene Ransom, MD;  Location: MAIN OR [place];  Service: Gastrointestinal    TONSILLECTOMY       Social History:  Social History     Tobacco Use    Smoking status: Never    Smokeless tobacco: Never   Vaping Use    Vaping status: Never Used   Substance Use Topics    Alcohol use: Not Currently     Comment: rare    Drug use: No   Occupation: He used to work as an Government social research officer for about 15 years. He does not currently work and is in the process of getting disability.     Family History:  Family History   Problem Relation Age of Onset    Autoimmune disease Maternal Grandmother         Psoriasis    Polycystic kidney disease Sister     Anesthesia problems Neg Hx     Ulcerative colitis Neg Hx     Crohn's disease Neg Hx     Colorectal Cancer Neg Hx     Pancreatic cancer Neg Hx     Esophageal cancer Neg Hx      Review of Systems: Please see above in the HPI, the remainder of a 10-system review was unremarkable.    Review of outside records: I have reviewed available records through Epic. Pertinent results include:     01/04/2021 Colonoscopy:   The Simple Endoscopic Score for Crohn's Disease was determined based on        the endoscopic appearance of the mucosa in the following segments:       - Ileum: Findings include no ulcers present, no ulcerated surfaces, no        affected surfaces and no narrowings. Segment score: 0.       - Right Colon: Findings include aphthous ulcers less than 0.5 cm in        size, less than 10% ulcerated surfaces, 50-75% of surfaces affected and        a single narrowing that can be passed. Segment score: 5.       - Transverse Colon: Findings include large ulcers 0.5-2 cm in size, less        than 10% ulcerated surfaces, 50-75% of surfaces affected and no        narrowings. Segment score: 5.       - Left Colon: Findings include aphthous ulcers less than 0.5 cm in size,        less than 10% ulcerated surfaces, greater than 75% of surfaces affected        and a single narrowing that can be passed. Segment score: 6.       - Rectum: Findings include aphthous ulcers less than 0.5 cm in size,        less than 10% ulcerated surfaces, less than 50% of surfaces affected and        no narrowings. Segment score: 3.       - Total SES-CD aggregate score: 19.  Impression:    - Preparation of the colon was fair.                         -  Condylomata.                         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 Biopsied.                         - Patchy moderate inflammation was found in the entire                          examined colon secondary to Crohn's disease with                          colonic involvement. The appearance was worse than                          2018 colonoscopy. Biopsied.                                                                                     Objective   Vitals:    03/14/24 1040   BP: 122/68   BP Position: Sitting   Pulse: 61   Temp: 36.9 ??C (98.4 ??F)   TempSrc: Temporal   Weight: 69.4 kg (153 lb)     Body mass index is 22.59 kg/m??.  Physical Exam    General:   Well appearing male in no acute distress, severe kyphosis   Eyes:   PERRL, EOMI, conjunctivae are clear.   ENT:   MMM. Oropharhynx without any erythema or exudate.   Neck:   Supple, very limited ROM of neck in all planes 2/2 cervical spinal fusion   Skin:    Diffuse pink, patchy, scaly plaques throughout upper and lower extremities, most prominent and diffuse along back, scalp, and posterior ears.    Psychiatry:   Alert and oriented to person, place, and time   Extremities:   Warm and well perfused. No edema.    Musculo Skeletal:   No joint tenderness, deformity, effusions. Decreased range of motion in both shoulders.  Full elbow, hip, knee, ankle, hands and feet. No evidence of active synovitis in the small joints of the hands. No dactylitis

## 2024-03-14 ENCOUNTER — Ambulatory Visit
Admit: 2024-03-14 | Discharge: 2024-03-14 | Payer: BLUE CROSS/BLUE SHIELD | Attending: Internal Medicine | Primary: Internal Medicine

## 2024-03-14 ENCOUNTER — Ambulatory Visit: Admit: 2024-03-14 | Discharge: 2024-03-14 | Payer: BLUE CROSS/BLUE SHIELD

## 2024-03-14 DIAGNOSIS — K50819 Crohn's disease of both small and large intestine with unspecified complications: Principal | ICD-10-CM

## 2024-03-14 DIAGNOSIS — M459 Ankylosing spondylitis of unspecified sites in spine: Principal | ICD-10-CM

## 2024-03-14 LAB — CBC W/ AUTO DIFF
BASOPHILS ABSOLUTE COUNT: 0.1 10*9/L (ref 0.0–0.1)
BASOPHILS RELATIVE PERCENT: 0.5 %
EOSINOPHILS ABSOLUTE COUNT: 0.1 10*9/L (ref 0.0–0.5)
EOSINOPHILS RELATIVE PERCENT: 1 %
HEMATOCRIT: 39.1 % (ref 39.0–48.0)
HEMOGLOBIN: 13.3 g/dL (ref 12.9–16.5)
LYMPHOCYTES ABSOLUTE COUNT: 1.3 10*9/L (ref 1.1–3.6)
LYMPHOCYTES RELATIVE PERCENT: 13 %
MEAN CORPUSCULAR HEMOGLOBIN CONC: 34 g/dL (ref 32.0–36.0)
MEAN CORPUSCULAR HEMOGLOBIN: 30.2 pg (ref 25.9–32.4)
MEAN CORPUSCULAR VOLUME: 88.8 fL (ref 77.6–95.7)
MEAN PLATELET VOLUME: 6.7 fL — ABNORMAL LOW (ref 6.8–10.7)
MONOCYTES ABSOLUTE COUNT: 0.9 10*9/L — ABNORMAL HIGH (ref 0.3–0.8)
MONOCYTES RELATIVE PERCENT: 8.4 %
NEUTROPHILS ABSOLUTE COUNT: 8 10*9/L — ABNORMAL HIGH (ref 1.8–7.8)
NEUTROPHILS RELATIVE PERCENT: 77.1 %
PLATELET COUNT: 223 10*9/L (ref 150–450)
RED BLOOD CELL COUNT: 4.4 10*12/L (ref 4.26–5.60)
RED CELL DISTRIBUTION WIDTH: 15.4 % — ABNORMAL HIGH (ref 12.2–15.2)
WBC ADJUSTED: 10.4 10*9/L (ref 3.6–11.2)

## 2024-03-14 LAB — COMPREHENSIVE METABOLIC PANEL
ALBUMIN: 4.2 g/dL (ref 3.4–5.0)
ALKALINE PHOSPHATASE: 60 U/L (ref 46–116)
ALT (SGPT): 15 U/L (ref 10–49)
ANION GAP: 11 mmol/L (ref 5–14)
AST (SGOT): 25 U/L (ref <=34)
BILIRUBIN TOTAL: 0.4 mg/dL (ref 0.3–1.2)
BLOOD UREA NITROGEN: 15 mg/dL (ref 9–23)
BUN / CREAT RATIO: 16
CALCIUM: 9.6 mg/dL (ref 8.7–10.4)
CHLORIDE: 106 mmol/L (ref 98–107)
CO2: 27 mmol/L (ref 20.0–31.0)
CREATININE: 0.92 mg/dL (ref 0.73–1.18)
EGFR CKD-EPI (2021) MALE: >90 mL/min/1.73m2 (ref >=60)
GLUCOSE RANDOM: 93 mg/dL (ref 70–179)
POTASSIUM: 4.6 mmol/L (ref 3.4–4.8)
PROTEIN TOTAL: 8.1 g/dL (ref 5.7–8.2)
SODIUM: 144 mmol/L (ref 135–145)

## 2024-03-20 DIAGNOSIS — K50819 Crohn's disease of both small and large intestine with unspecified complications: Principal | ICD-10-CM

## 2024-03-20 MED ORDER — RINVOQ 30 MG TABLET,EXTENDED RELEASE
ORAL_TABLET | Freq: Every day | ORAL | 0 refills | 90.00000 days
Start: 2024-03-20 — End: ?

## 2024-03-21 MED ORDER — UPADACITINIB ER 30 MG TABLET,EXTENDED RELEASE 24 HR
ORAL_TABLET | Freq: Every day | ORAL | 0 refills | 90.00000 days | Status: CP
Start: 2024-03-21 — End: ?
  Filled 2024-03-29: qty 30, 30d supply, fill #0

## 2024-03-21 NOTE — Unmapped (Signed)
 Encounter for refill request:  Last clinic visit: 10/31/2023  Colonoscopy:     Lab Results   Component Value Date    WBC 10.4 03/14/2024    RBC 4.40 03/14/2024    HGB 13.3 03/14/2024    HCT 39.1 03/14/2024    PLT 223 03/14/2024    ALT 15 03/14/2024    AST 25 03/14/2024    ALKPHOS 60 03/14/2024    CRP 21.0 (H) 12/27/2022    CREATININE 0.92 03/14/2024       Rinvoq  refills authorized x 3 month supply.

## 2024-03-24 MED ORDER — BUSPIRONE 15 MG TABLET
ORAL_TABLET | Freq: Three times a day (TID) | ORAL | 1 refills | 0.00000 days
Start: 2024-03-24 — End: ?

## 2024-03-25 NOTE — Unmapped (Signed)
 [facility] Home Delivery Pharm[facility] Refill Coordination Note    Kerry Mooney, DOB: 1980/10/06  Phone: [phone] (home)       All above HIPAA information was verified with patient.         03/25/2024     6:35 AM   Specialty Rx Medication Refill Questionnaire   Which Medications would you like refilled and shipped? Rinvoq . I have 7 days left including the dose for today.   Please list all current allergies: none   Have you missed any doses in the last 30 days? No   Have you had any changes to your medication(s) since your last refill? No   How much of each medication do you have remaining at home? (eg. number of tablets, injections, etc.) 7 tablets   Have you experienced any side effects in the last 30 days? No   Please enter the full address (street address, city, state, zip code) where you would like your medication(s) to be delivered to. [address], [place], [place]  [address]   Please specify on which day you would like your medication(s) to arrive. Note: if you need your medication(s) within 3 days, please call the pharmacy to schedule your order at [phone]  03/29/2024   Has your insurance changed since your last refill? No   Would you like a pharmacist to call you to discuss your medication(s)? No   Do you require a signature for your package? (Note: if we are billing Medicare Part B or your order contains a controlled substance, we will require a signature) No   I have been provided my out of pocket cost for my medication and approve the pharmacy to charge the amount to my credit card on file. Yes         Completed refill call assessment today to schedule patient's medication shipment from the [facility] [phone]).  All relevant notes have been reviewed.       Confirmed patient received a Conservation officer, historic buildings and a Surveyor, mining with first shipment. The patient will receive a drug information handout for each medication shipped and additional FDA Medication Guides as required.         REFERRAL TO PHARMACIST     Referral to the pharmacist: Not needed      [facility]     Shipping address confirmed in Epic.     Delivery Scheduled: Yes, Expected medication delivery date: 03/29/2024.     Medication will be delivered via Same Day Courier to the prescription address in Epic WAM.    Kerry Mooney   [facility] Delivery Pharmacy Specialty Technician

## 2024-03-26 MED ORDER — BUSPIRONE 15 MG TABLET
ORAL_TABLET | Freq: Three times a day (TID) | ORAL | 1 refills | 90.00000 days | Status: CP
Start: 2024-03-26 — End: 2025-03-26

## 2024-03-26 NOTE — Unmapped (Signed)
 Unable to complete refill request, medication is not included in the refill protocol.   Please review below request.     Adult Refill: Patient is requesting the following refill  Requested Prescriptions     Pending Prescriptions Disp Refills    busPIRone  (BUSPAR ) 15 MG tablet [Pharmacy Med Name: BUSPIRONE  HCL 15 MG TABLET] 270 tablet 1     Sig: TAKE 1 TABLET (15 MG TOTAL) BY MOUTH THREE (3) TIMES A DAY.       Recent Visits  No visits were found meeting these conditions.  Showing recent visits within past 365 days and meeting all other requirements  Future Appointments  No visits were found meeting these conditions.  Showing future appointments within next 365 days and meeting all other requirements       Labs: Not applicable this refill

## 2024-04-08 ENCOUNTER — Ambulatory Visit: Admit: 2024-04-08 | Discharge: 2024-04-09 | Payer: BLUE CROSS/BLUE SHIELD

## 2024-04-08 DIAGNOSIS — N319 Neuromuscular dysfunction of bladder, unspecified: Principal | ICD-10-CM

## 2024-04-08 DIAGNOSIS — R3915 Urgency of urination: Principal | ICD-10-CM

## 2024-04-08 DIAGNOSIS — S14151A Other incomplete lesion at C1 level of cervical spinal cord, initial encounter: Principal | ICD-10-CM

## 2024-04-08 DIAGNOSIS — N200 Calculus of kidney: Principal | ICD-10-CM

## 2024-04-08 MED ORDER — CEPHALEXIN 500 MG CAPSULE
ORAL_CAPSULE | Freq: Once | ORAL | 0 refills | 1.00000 days | Status: CP
Start: 2024-04-08 — End: 2024-04-08

## 2024-04-08 MED ADMIN — diatrizoate meglumine (HYPAQUE) 30 % solution 300 mL: 300 mL | URETHRAL | @ 16:00:00 | Stop: 2024-04-08

## 2024-04-08 MED ADMIN — cephalexin (KEFLEX) capsule 500 mg: 500 mg | ORAL | @ 16:00:00 | Stop: 2024-04-08

## 2024-04-08 MED ADMIN — diatrizoate meglumine (HYPAQUE) 30 % solution 300 mL: 300 mL | URETHRAL | @ 17:00:00 | Stop: 2024-04-08

## 2024-04-08 NOTE — Unmapped (Signed)
 43 y.o. male who I am seeing today at the request of PA Nusbaum for evaluation of urgency/frequency in the setting of incomplete cervical spinal cord injury.        Voiding diary: none completed      Procedure Description  Informed consent obtained prior to the start of the procedure.         PROCEDURE:    A urinalysis was performed to exclude infection. The patient was then catheterized to measure the residual urine.   A 50F dual lumen urodynamics catheter was placed under sterile conditions into the patient???s bladder. A 50F catheter was placed into the rectum in order to measure abdominal pressure. EMG patches were placed in the appropriate position.  All connections were confirmed and calibrations/adjusted made.  Contrast was instilled into the bladder through our dual lumen catheters. Periodic fluoroscopic images were taken during filling & voiding for the cystogram/VCUG portion of the examination.  The patient was able to void and the bladder was then emptied of its residual.    RESULTS     UROFLOW:  Revealed a Qmax of  14.55mL/sec.  He voided 46 mL and had a residual of  50 mL.  Unable to interpret     CMG:   This was performed with Contrast in the sitting position at a fill rate of 10 mL/min.      FILL/STORAGE:   Capacity:  First urge was 20 mL, strong urge was 20 mL and capacity was 109 mL.     Compliance:  Normal. End fill detrusor pressure was 2 cmH2O.  Calculated compliance was 54.5 mL/cmH2O     Detrusor overactivity was seen.  Volume at first detrusor contraction was 20mL.   Detrusor overactivity incontinence was demonstrated starting at 20 mL at a Pdet of 26.6 cmH2O.       Stress incontinence was not demonstrated on this study.      MICTURITION STUDY:   Voiding was performed in the sitting  position.  Opening pressure was 25.8 cm of water and Pdet at Qmax was 28.4 cm of water.  Qmax was 8.5 mL/sec.  It was a normal pattern.  He voided 49 mL and had a residual of 60 mL.  It was a volitional void, with a sustained contraction.  Abdominal strain was present at onset of void.  It represented normal habits.      Bladder outlet obstructive index: unable to interpret due to low detrusor pressure        FLUOROSCOPY  Cystogram: slightly irregular bladder wall contours, closed BN during filling, Left VUR (grade 2)  Voiding Cystourethrogram: open BN with grade 2 VUR.          EMG:   This was performed with patches.  He had voluntary contractions.  EMG signal intermittently increased during second cycle.       ASSESSMENT:   43 y.o. male who presents with worsening urinary urgency/frequency in the setting of incomplete cervical SCI in 2022. Two cycles completed. Today's urodynamics show similar findings compared to urodynamics completed last month. Patient is noted to have phasic NDOI (low amplitude detrusor pressures) associated with incomplete bladder emptying*. Overall, cystometric capacity is low, compliance calculates as normal but would have to factor in early onset or left VUR (grade 2) at ~30 mL*. During second cycle, he was able to enter volitional voiding phase generated after initial valsalva. VCUG also demonstrate wide open bladder neck but ?filling defect at level of bulbar urethra, per conversation with Dr.  Borawski will correlate with upcoming cystoscopy.       1. Sensation: increased   2. Capacity: decreased    3. Compliance: normal*   4. Stress Incontinence:  not observed   5. Detrusor Overactivity:  observed   6. Emptying: incomplete*   7. Outlet: unobstructed          PLAN:   Follow-up with Dr. Jillyn for cystoscopy with RUS prior.  Post procedural antibiotics were administered.  Urine sent for culture.      Fluoro time: 36 seconds

## 2024-04-08 NOTE — Unmapped (Addendum)
 [facility]:  Taking Care of Yourself After Cystoscopy/Urodynamics Procedures    *Drink plenty of water  for a day or two following your procedure.  Try to have about 8 ounces (one cup) at a time, and do this 6 times or more per day.  (If you have fluid restrictions, please ask the nurse or doctor for advice).    *AVOID alcoholic, carbonated and caffeinated drinks for a day or two, as they may cause uncomfortable symptoms.    *For the first 8 hours after the procedure, your urine may be pink or red in color.  Small clots or a few drops of blood can be a normal side effect of the instruments.  Large amounts of bleeding or difficulty urinating are not normal.  Call your doctor if this happens.    *You may experience some mild discomfort of a burning sensation with urination after having this procedure.  If it does not improve, or if other symptoms appear (fever, chills, or difficulty emptying), call your doctor.    *You may return to normal daily activities such as work, school, driving, exercising and housework.    *If your doctor gave you a prescription, take it as ordered.    *If you need a return appointment, the secretary will make it for you when you check out. To contact the Urology Clinic during business hours, call [phone].    [facility] Operator can be reached at [phone] if you need to get in contact with your doctor.  After the hours, the operator can page the doctor on call for urgent concerns:    Urology Patients should ask for the Urology resident ???on call???.    You can get more immediate assistance at the Emergency Room or Urgent Care if necessary.

## 2024-04-09 ENCOUNTER — Inpatient Hospital Stay: Admit: 2024-04-09 | Discharge: 2024-04-10 | Payer: BLUE CROSS/BLUE SHIELD

## 2024-04-12 ENCOUNTER — Inpatient Hospital Stay: Admit: 2024-04-12 | Discharge: 2024-04-13 | Payer: BLUE CROSS/BLUE SHIELD

## 2024-04-25 NOTE — Unmapped (Signed)
 [facility] Clinical Assessment & Refill Coordination Note    Kerry Mooney, DOB: February 27, 1981  Phone: [phone] (home)     All above HIPAA information was verified with patient.     Was a Nurse, learning disability used for this call? No    Specialty Medication(s):   Inflammatory Disorders: Rinvoq      Current Medications[1]     Changes to medications: [place] reports no changes at this time.    Medication list has been reviewed and updated in Epic: Yes    Allergies[2]    Changes to allergies: No    Allergies have been reviewed and updated in Epic: Yes    SPECIALTY MEDICATION ADHERENCE     Rinvoq  30 mg: 5 days of medicine on hand     Medication Adherence    Patient reported X missed doses in the last month: 0  Specialty Medication: Rinvoq  30mg  - 1qd  Patient is on additional specialty medications: No  Informant: patient          Specialty medication(s) dose(s) confirmed: Regimen is correct and unchanged.     Are there any concerns with adherence? No    Adherence counseling provided? Not needed    CLINICAL MANAGEMENT AND INTERVENTION      Clinical Benefit Assessment:    Do you feel the medicine is effective or helping your condition? Yes    Clinical Benefit counseling provided? Not needed    Adverse Effects Assessment:    Are you experiencing any side effects? No    Are you experiencing difficulty administering your medicine? No    Quality of Life Assessment:    Quality of Life    Rheumatology  Oncology  Dermatology  Cystic Fibrosis          How many days over the past month did your Crohn's  keep you from your normal activities? For example, brushing your teeth or getting up in the morning. 0    Have you discussed this with your provider? Not needed    Acute Infection Status:    Acute infections noted within Epic:  No active infections    Patient reported infection: None    Therapy Appropriateness:    Is the medication and dose appropriate considering the patient???s diagnosis, treatment, and disease journey, comorbidities, medical history, current medications, allergies, therapeutic goals, self-administration ability, and access barriers? Yes, therapy is appropriate and should be continued     Clinical Intervention:    Was an intervention completed as part of this clinical assessment? No    DISEASE/MEDICATION-SPECIFIC INFORMATION      N/A    Chronic Inflammatory Diseases: Have you experienced any flares in the last month? No  Has this been reported to your provider? Not applicable    PATIENT SPECIFIC NEEDS     Does the patient have any physical, cognitive, or cultural barriers? No    Is the patient high risk? No    Does the patient require physician intervention or other additional services (i.e., nutrition, smoking cessation, social work)? No    Does the patient have an additional or emergency contact listed in their chart? Yes    SOCIAL DETERMINANTS OF HEALTH     At the [facility], we have learned that life circumstances - like trouble affording food, housing, utilities, or transportation can affect the health of many of our patients.   That is why we wanted to ask: are you currently experiencing any life circumstances that are negatively impacting your  health and/or quality of life? No    Social Drivers of Health     Food Insecurity: No Food Insecurity (06/17/2021)    Hunger Vital Sign     Worried About Running Out of Food in the Last Year: Never true     Ran Out of Food in the Last Year: Never true   Tobacco Use: Low Risk  (04/08/2024)    Patient History     Smoking Tobacco Use: Never     Smokeless Tobacco Use: Never     Passive Exposure: Not on file   Transportation Needs: No Transportation Needs (08/05/2021)    PRAPARE - Transportation     Lack of Transportation (Medical): No     Lack of Transportation (Non-Medical): No   Recent Concern: Transportation Needs - Unmet Transportation Needs (06/01/2021)    PRAPARE - Therapist, art (Medical): Yes     Lack of Transportation (Non-Medical): Yes   Alcohol Use: Not on file   Housing: Not on file   Physical Activity: Not on file   Utilities: Not on file   Stress: Not on file   Interpersonal Safety: Not At Risk (03/14/2024)    Interpersonal Safety     Unsafe Where You Currently Live: No     Physically Hurt by Anyone: No     Abused by Anyone: No   Substance Use: Not on file (05/17/2023)   Intimate Partner Violence: Not At Risk (03/14/2024)    Humiliation, Afraid, Rape, and Kick questionnaire     Fear of Current or Ex-Partner: No     Emotionally Abused: No     Physically Abused: No     Sexually Abused: No   Social Connections: Not on file   Financial Resource Strain: Low Risk  (06/17/2021)    Overall Financial Resource Strain (CARDIA)     Difficulty of Paying Living Expenses: Not hard at all   Health Literacy: Low Risk  (08/05/2021)    Health Literacy     : Never   Recent Concern: Health Literacy - Medium Risk (07/17/2021)    Health Literacy     : Rarely   Internet Connectivity: Not on file       Would you be willing to receive help with any of the needs that you have identified today? Not applicable       SHIPPING     Specialty Medication(s) to be Shipped:   Inflammatory Disorders: Rinvoq     Other medication(s) to be shipped: No additional medications requested for fill at this time    Specialty Medications not needed at this time: N/A     Changes to insurance: No    Cost and Payment: Patient has a $0 copay, payment information is not required.    Delivery Scheduled: Yes, Expected medication delivery date: 10/20.     Medication will be delivered via Same Day Courier to the confirmed prescription address in [facility].    The patient will receive a drug information handout for each medication shipped and additional FDA Medication Guides as required.  Verified that patient has previously received a Conservation officer, historic buildings and a Surveyor, mining.    The patient or caregiver noted above participated in the development of this care plan and knows that they can request review of or adjustments to the care plan at any time.      All of the patient's questions and concerns have been addressed.    Bernice Mullin A Gabrien Mentink, PharmD  [facility] Specialty Pharmacist       [1]   Current Outpatient Medications   Medication Sig Dispense Refill    ascorbic acid, vitamin C, (VITAMIN C) 500 MG tablet Take 1 tablet (500 mg total) by mouth daily.      busPIRone  (BUSPAR ) 15 MG tablet TAKE 1 TABLET (15 MG TOTAL) BY MOUTH THREE (3) TIMES A DAY. 270 tablet 1    ferrous sulfate 325 (65 FE) MG tablet Take 1 tablet (325 mg total) by mouth in the morning.      levETIRAcetam  (KEPPRA ) 750 MG tablet Take 2 tabs in am and 2.5 tabs in pm. 405 tablet 3    solifenacin  (VESICARE ) 10 MG tablet Take 1 tablet (10 mg total) by mouth daily. 30 tablet 11    tamsulosin  (FLOMAX ) 0.4 mg capsule TAKE 1 CAPSULE BY MOUTH EVERY DAY 90 capsule 3    upadacitinib  (RINVOQ ) 30 mg tablet Take 1 tablet (30 mg total) by mouth daily. 90 tablet 0     No current facility-administered medications for this visit.   [2]   Allergies  Allergen Reactions    Infliximab      Seizure activity  Other reaction(s): Other (See Comments)  Seizures

## 2024-04-28 DIAGNOSIS — K50819 Crohn's disease of both small and large intestine with unspecified complications: Principal | ICD-10-CM

## 2024-04-29 NOTE — Unmapped (Signed)
 [facility] Rinvoq  shipment will be delayed as a result of prior authorization being required by the patient's insurance.     I have reached out to the patient  at [phone] and communicated the delay. We will call the patient back to reschedule the delivery upon resolution. We have not confirmed the new delivery date.

## 2024-04-30 MED FILL — RINVOQ 30 MG TABLET,EXTENDED RELEASE: ORAL | 30 days supply | Qty: 30 | Fill #1

## 2024-04-30 NOTE — Unmapped (Signed)
 [facility] Rinvoq  shipment will be rescheduled as a result of prior authorization now approved.     I have reached out to the patient  at [phone] and communicated the delivery change. We will reschedule the medication for the delivery date that the patient agreed upon.  We have confirmed the delivery date as 10/21, via same day courier.

## 2024-05-25 NOTE — Progress Notes (Signed)
 Procedures    + UA with symptoms.  Will defer cysto today.  Will send culture.  Empiric rx sent.

## 2024-05-27 ENCOUNTER — Ambulatory Visit: Admit: 2024-05-27 | Discharge: 2024-05-28 | Payer: BLUE CROSS/BLUE SHIELD | Attending: Urology | Primary: Urology

## 2024-05-27 DIAGNOSIS — R3915 Urgency of urination: Principal | ICD-10-CM

## 2024-05-27 DIAGNOSIS — N319 Neuromuscular dysfunction of bladder, unspecified: Principal | ICD-10-CM

## 2024-05-27 DIAGNOSIS — N3000 Acute cystitis without hematuria: Principal | ICD-10-CM

## 2024-05-27 MED ORDER — CEPHALEXIN 500 MG CAPSULE
ORAL_CAPSULE | Freq: Three times a day (TID) | ORAL | 0 refills | 10.00000 days | Status: CP
Start: 2024-05-27 — End: ?

## 2024-05-27 MED ADMIN — cephalexin (KEFLEX) capsule 500 mg: 500 mg | ORAL | @ 17:00:00 | Stop: 2024-05-27

## 2024-05-27 NOTE — Progress Notes (Signed)
 [facility] Refill Coordination Note    Kerry Mooney, DOB: 1981-01-20  Phone: [phone] (home)       All above HIPAA information was verified with patient.         05/27/2024     4:35 AM   Specialty Rx Medication Refill Questionnaire   Which Medications would you like refilled and shipped? Rinvoq , I have 4 doses left, including today.   Please list all current allergies: none   Have you missed any doses in the last 30 days? No   Have you had any changes to your medication(s) since your last refill? No   How much of each medication do you have remaining at home? (eg. number of tablets, injections, etc.) 4 doses   Have you experienced any side effects in the last 30 days? No   Please enter the full address (street address, city, state, zip code) where you would like your medication(s) to be delivered to. [address] [place], [place] [address]   Please specify on which day you would like your medication(s) to arrive. Note: if you need your medication(s) within 3 days, please call the pharmacy to schedule your order at [phone]  05/31/2024   Has your insurance changed since your last refill? No   Would you like a pharmacist to call you to discuss your medication(s)? No   Do you require a signature for your package? (Note: if we are billing Medicare Part B or your order contains a controlled substance, we will require a signature) No   I have been provided my out of pocket cost for my medication and approve the pharmacy to charge the amount to my credit card on file. Yes         Completed refill call assessment today to schedule patient's medication shipment from the [facility] [phone]).  All relevant notes have been reviewed.       Confirmed patient received a Conservation Officer, Historic Buildings and a Surveyor, Mining with first shipment. The patient will receive a drug information handout for each medication shipped and additional FDA Medication Guides as required.         REFERRAL TO PHARMACIST     Referral to the pharmacist: Not needed      [facility]     Shipping address confirmed in Epic.     Delivery Scheduled: Yes, Expected medication delivery date: 05/31/24.     Medication will be delivered via Next Day Courier to the prescription address in Epic [place].    Kerry Mooney   [facility] Specialty Technician

## 2024-05-30 MED FILL — RINVOQ 30 MG TABLET,EXTENDED RELEASE: ORAL | 30 days supply | Qty: 30 | Fill #2

## 2024-06-20 DIAGNOSIS — K50819 Crohn's disease of both small and large intestine with unspecified complications: Principal | ICD-10-CM

## 2024-06-20 MED ORDER — UPADACITINIB ER 30 MG TABLET,EXTENDED RELEASE 24 HR
ORAL_TABLET | Freq: Every day | ORAL | 0 refills | 30.00000 days | Status: CP
Start: 2024-06-20 — End: ?
  Filled 2024-06-26: qty 30, 30d supply, fill #0

## 2024-06-20 NOTE — Telephone Encounter (Signed)
 Encounter for refill request:  Last clinic visit: 10/31/2023  Colonoscopy:     Lab Results   Component Value Date    WBC 10.4 03/14/2024    RBC 4.40 03/14/2024    HGB 13.3 03/14/2024    HCT 39.1 03/14/2024    PLT 223 03/14/2024    ALT 15 03/14/2024    AST 25 03/14/2024    ALKPHOS 60 03/14/2024    CRP 21.0 (H) 12/27/2022    CREATININE 0.92 03/14/2024       Rinvoq  refills authorized x 1. Pt due for labs and appt. GIS messaged to schedule.
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
## 2024-06-24 NOTE — Progress Notes (Signed)
 [facility] Refill Coordination Note    Kerry Mooney, DOB: 12/27/1980  Phone: [phone] (home)       All above HIPAA information was verified with patient.         06/21/2024    11:03 AM   Specialty Rx Medication Refill Questionnaire   Which Medications would you like refilled and shipped? Rinvoq , i have 10 days supply, including today   Please list all current allergies: none   Have you missed any doses in the last 30 days? No   Have you had any changes to your medication(s) since your last refill? No   How much of each medication do you have remaining at home? (eg. number of tablets, injections, etc.) 10 tablets, including today's dose   Have you experienced any side effects in the last 30 days? No   Please enter the full address (street address, city, state, zip code) where you would like your medication(s) to be delivered to. [address], [place], [place] [address]   Please specify on which day you would like your medication(s) to arrive. Note: if you need your medication(s) within 3 days, please call the pharmacy to schedule your order at [phone]  06/27/2024   Has your insurance changed since your last refill? No   Would you like a pharmacist to call you to discuss your medication(s)? No   Do you require a signature for your package? (Note: if we are billing Medicare Part B or your order contains a controlled substance, we will require a signature) No   I have been provided my out of pocket cost for my medication and approve the pharmacy to charge the amount to my credit card on file. Yes         Completed refill call assessment today to schedule patient's medication shipment from the [facility] [phone]).  All relevant notes have been reviewed.       Confirmed patient received a Conservation Officer, Historic Buildings and a Surveyor, Mining with first shipment. The patient will receive a drug information handout for each medication shipped and additional FDA Medication Guides as required.         REFERRAL TO PHARMACIST     Referral to the pharmacist: Not needed      [facility]     Shipping address confirmed in Epic.     Delivery Scheduled: Yes, Expected medication delivery date: 06/27/24.     Medication will be delivered via Next Day Courier to the prescription address in Epic [place].    Kerry Mooney   [facility] Specialty Technician

## 2024-06-25 ENCOUNTER — Ambulatory Visit
Admit: 2024-06-25 | Discharge: 2024-06-26 | Payer: BLUE CROSS/BLUE SHIELD | Attending: Student in an Organized Health Care Education/Training Program | Primary: Student in an Organized Health Care Education/Training Program

## 2024-06-25 DIAGNOSIS — M459 Ankylosing spondylitis of unspecified sites in spine: Principal | ICD-10-CM

## 2024-06-25 DIAGNOSIS — K50819 Crohn's disease of both small and large intestine with unspecified complications: Principal | ICD-10-CM

## 2024-06-25 DIAGNOSIS — D84821 Immunosuppression due to drug therapy (HHS-HCC): Principal | ICD-10-CM

## 2024-06-25 DIAGNOSIS — Z79899 Other long term (current) drug therapy: Principal | ICD-10-CM

## 2024-06-25 DIAGNOSIS — Z7185 Vaccine counseling: Principal | ICD-10-CM

## 2024-06-25 LAB — CBC
HEMATOCRIT: 40 % (ref 39.0–48.0)
HEMOGLOBIN: 13.4 g/dL (ref 12.9–16.5)
MEAN CORPUSCULAR HEMOGLOBIN CONC: 33.6 g/dL (ref 32.0–36.0)
MEAN CORPUSCULAR HEMOGLOBIN: 30.1 pg (ref 25.9–32.4)
MEAN CORPUSCULAR VOLUME: 89.7 fL (ref 77.6–95.7)
MEAN PLATELET VOLUME: 7.3 fL (ref 6.8–10.7)
PLATELET COUNT: 233 10*9/L (ref 150–450)
RED BLOOD CELL COUNT: 4.46 10*12/L (ref 4.26–5.60)
RED CELL DISTRIBUTION WIDTH: 15 % (ref 12.2–15.2)
WBC ADJUSTED: 14 10*9/L — ABNORMAL HIGH (ref 3.6–11.2)

## 2024-06-25 LAB — COMPREHENSIVE METABOLIC PANEL
ALBUMIN: 4.3 g/dL (ref 3.4–5.0)
ALKALINE PHOSPHATASE: 71 U/L (ref 46–116)
ALT (SGPT): 18 U/L (ref 10–49)
ANION GAP: 9 mmol/L (ref 5–14)
AST (SGOT): 25 U/L (ref <=34)
BILIRUBIN TOTAL: 0.4 mg/dL (ref 0.3–1.2)
BLOOD UREA NITROGEN: 15 mg/dL (ref 9–23)
BUN / CREAT RATIO: 16
CALCIUM: 9.8 mg/dL (ref 8.7–10.4)
CHLORIDE: 106 mmol/L (ref 98–107)
CO2: 27.2 mmol/L (ref 20.0–31.0)
CREATININE: 0.93 mg/dL (ref 0.73–1.18)
EGFR CKD-EPI (2021) MALE: >90 mL/min/1.73m2 (ref >=60)
GLUCOSE RANDOM: 86 mg/dL (ref 70–179)
POTASSIUM: 4.2 mmol/L (ref 3.4–4.8)
PROTEIN TOTAL: 7.8 g/dL (ref 5.7–8.2)
SODIUM: 142 mmol/L (ref 135–145)

## 2024-06-25 NOTE — Patient Instructions (Addendum)
 [place] CHRISTELLA Kitty it was a pleasure seeing you today.  Here is a summary from today's visit:     Continue your Rinvoq  30mg  daily as prescribed.   We will check Rinvoq  monitoring labs in clinic today.   Schedule your colonoscopy by calling [phone] option 2. This is to ensure you do not have ongoing inflammation after starting the Rinvoq  medication.   Complete your stool test for inflammation at your local [facility]. We have placed this order to [facility] today. You will need to pick up the stool collection kit before you drop it off.   Today we discussed that two vaccine you can receive would be the influenza vaccine and the COVID vaccine. You were not interested in these vaccines today, but we can always readdress them at a future appointment. You can also get them with your PCP or at a local pharmacy.    If any trouble or symptoms, do not hesitate to call.     Rodger Phlegm, MD  Associate Professor of Medicine  Division of Gastroenterology & Hepatology  [employer]  - [facility]            EXPECTATIONS FOR PATIENT FOLLOW UP AND COMMUNICATION:  -- Follow up Appointments:  Crohn's disease and Ulcerative colitis are serious chronic inflammatory diseases which require close monitoring.  I expect to see patients for a follow up visits at least every 6 months (or more often if you are having a flare, starting new therapy, etc).  In select cases, patients who are only on aminosalicylates / mesalamine, may be seen once per year for follow up.  If you are not able to come for follow up appointments we may not be able to refill your medications or continue caring for you.   -- Appointment Type: While we are continuing to offer video appointments in select cases (stable patients who are not in a flare and without new/active symptoms) during the COVID19 pandemic, I expect to see patients in person at least once per year.   -- Communication:  if you have any questions or concerns, you can communicate with us  via phone (contact information below) or via myChart.  Note that phone messages are given higher priority and triaged first. For urgent issues, please contact us  via phone.      IBD NURSE COORDINATOR CONTACT - Duwaine Legions, RN     Phone: [phone] (direct line)      Fax: [phone]  * For urgent medical concerns after hours or on weekends and holidays, call [phone] and ask to speak to the GI Fellow on call.    * If you have a GI medical question or GI symptoms and would like to speak to your provider's healthcare team, please contact Duwaine Legions, RN (contact information above) OR you can send the GI healthcare team a message through MyChart at [email or web address]    [facility] MESSAGES  -- MyChart messages and advice requests are now going to be sent to our clinical care team. A team member will respond to the message or send it to your provider if needed.   -- The message will typically be addressed within 3-4 business days if it is an issue that can be handled by MyChart.   -- For multiple questions, complex concerns, and/or if you have not been by seen by your provider in the recommended follow-up period you may be asked to schedule a clinic visit.   -- MyChart messages are NOT read after hours or  on weekends. MyChart is NOT meant for urgent issues or emergencies. For emergencies call 911 or go to the nearest emergency department.     APPOINTMENT SCHEDULING FOR GI CLINIC AND GI PROCEDURES:  RADIOLOGY - to schedule imaging ordered, please call [phone]  GI PROCEDURES         [phone] option 2   GI MEDICINE CLINIC  [phone] option 1   *To schedule, reschedule, or cancel your GI appointment, please call [phone]. If you are unable to come to an appointment, please notify us  as soon as possible, preferably 24 hours in advance. Doing so may allow other patients with urgent needs to be scheduled in a cancelled appointment slot.     TEST RESULTS   If you have a MyChart account, your new results and a provider message will be sent to you through your MyChart account at [email or web address]. For results that require follow-up, a member of your healthcare team will also contact you.    PRESCRIPTION REFILL REQUESTS  To request prescription refills, please contact your pharmacy or send your healthcare team a message through your MyChart account at [email or web address]  RECORD REQUESTS  For questions related to medical records, please call Medical Records Release of Information at [phone]  FINANCIAL NAVIGATOR   For billing and other financial questions/needs - please call [phone]. If you need to leave a message, please be sure to leave your full name, date of birth or Medical record number, best call back # and reason for call.    For educational material and resources:  [email or web address]    ================================================================

## 2024-07-12 NOTE — Patient Instructions (Signed)
 [facility]:  Taking Care of Yourself After Cystoscopy Procedures    *Drink plenty of water for a day or two following your procedure.  Try to have about 8 ounces (one cup) at a time, and do this 6 times or more per day.  (If you have fluid restrictions, please ask the nurse or doctor for advice).    *AVOID alcoholic, carbonated and caffeinated drinks for a day or two, as they may cause uncomfortable symptoms.    *For the first 8 hours after the procedure, your urine may be pink or red in color.  Small clots or a few drops of blood can be a normal side effect of the instruments.  Large amounts of bleeding or difficulty urinating are not normal.  Call your doctor if this happens.    *You may experience some mild discomfort of a burning sensation with urination after having this procedure.  If it does not improve, or if other symptoms appear (fever, chills, or difficulty emptying), call your doctor.    *You may return to normal daily activities such as work, school, driving, exercising and housework.    *If your doctor gave you a prescription, take it as ordered.    *If you need a return appointment, the secretary will make it for you when you check out. To contact the Urology Clinic during business hours, call [phone].    [facility] Operator can be reached at [phone] if you need to get in contact with your doctor.  After the hours, the operator can page the doctor on call for urgent concerns:    Urology Patients should ask for the Urology resident ???on call???.    You can get more immediate assistance at the Emergency Room or Urgent Care if necessary.

## 2024-07-15 ENCOUNTER — Ambulatory Visit: Admit: 2024-07-15 | Discharge: 2024-07-16 | Payer: BLUE CROSS/BLUE SHIELD | Attending: Urology | Primary: Urology

## 2024-07-15 DIAGNOSIS — R3915 Urgency of urination: Principal | ICD-10-CM

## 2024-07-15 DIAGNOSIS — N319 Neuromuscular dysfunction of bladder, unspecified: Principal | ICD-10-CM

## 2024-07-15 NOTE — Progress Notes (Signed)
 Procedures    + UA with symptoms again  Will defer cysto today.  Rescheduled for 1/15 @ 1300.  Will keep on antibiotic through cysto.  Will send culture.

## 2024-07-17 DIAGNOSIS — N3000 Acute cystitis without hematuria: Principal | ICD-10-CM

## 2024-07-17 MED ORDER — LEVOFLOXACIN 500 MG TABLET
ORAL_TABLET | ORAL | 0 refills | 10.00000 days | Status: CP
Start: 2024-07-17 — End: 2024-07-27

## 2024-07-17 MED ORDER — CEPHALEXIN 500 MG CAPSULE
ORAL_CAPSULE | Freq: Three times a day (TID) | ORAL | 0 refills | 10.00000 days
Start: 2024-07-17 — End: ?

## 2024-07-17 NOTE — Telephone Encounter (Signed)
 Sent in alterntive antibiotic

## 2024-07-25 ENCOUNTER — Ambulatory Visit: Admit: 2024-07-25 | Discharge: 2024-07-25 | Payer: BLUE CROSS/BLUE SHIELD | Attending: Urology | Primary: Urology

## 2024-07-25 DIAGNOSIS — N319 Neuromuscular dysfunction of bladder, unspecified: Principal | ICD-10-CM

## 2024-07-25 DIAGNOSIS — N390 Urinary tract infection, site not specified: Principal | ICD-10-CM

## 2024-07-25 DIAGNOSIS — K50819 Crohn's disease of both small and large intestine with unspecified complications: Principal | ICD-10-CM

## 2024-07-25 MED ORDER — UPADACITINIB ER 30 MG TABLET,EXTENDED RELEASE 24 HR
ORAL_TABLET | Freq: Every day | ORAL | 0 refills | 90.00000 days | Status: CP
Start: 2024-07-25 — End: ?
  Filled 2024-07-26: qty 30, 30d supply, fill #0

## 2024-07-25 MED ORDER — NITROFURANTOIN MACROCRYSTAL 100 MG CAPSULE
ORAL_CAPSULE | Freq: Every day | ORAL | 11 refills | 30.00000 days | Status: CP
Start: 2024-07-25 — End: ?

## 2024-07-25 MED ADMIN — sodium chloride irrigation (NS) 0.9 % irrigation solution 500 mL: 500 mL | @ 18:00:00 | Stop: 2024-07-25

## 2024-07-25 MED ADMIN — lidocaine 2% gel (XYLOCAINE) jelly urojet 20 mL: 20 mL | URETHRAL | @ 18:00:00 | Stop: 2024-07-25

## 2024-07-25 NOTE — Progress Notes (Signed)
 [facility] Refill Coordination Note    Specialty Medication(s) to be Shipped:   Inflammatory Disorders: Rinvoq     Other medication(s) to be shipped: No additional medications requested for fill at this time    Specialty Medications not needed at this time: N/A     Kerry Mooney, DOB: 01/30/1981  Phone: [phone] (home)       All above HIPAA information was verified with patient.     Was a nurse, learning disability used for this call? No    Completed refill call assessment today to schedule patient's medication shipment from the [facility]  [phone]).  All relevant notes have been reviewed.     Specialty medication(s) and dose(s) confirmed: Regimen is correct and unchanged.   Changes to medications: [name] reports no changes at this time.  Changes to insurance: No  New side effects reported not previously addressed with a pharmacist or physician: None reported  Questions for the pharmacist: No    Confirmed patient received a Conservation Officer, Historic Buildings and a Surveyor, Mining with first shipment. The patient will receive a drug information handout for each medication shipped and additional FDA Medication Guides as required.       DISEASE/MEDICATION-SPECIFIC INFORMATION        N/A    SPECIALTY MEDICATION ADHERENCE     Medication Adherence    Patient reported X missed doses in the last month: 0  Specialty Medication: RINVOQ  30 mg tablet (upadacitinib )  Patient is on additional specialty medications: No              Were doses missed due to medication being on hold? No     RINVOQ  30 mg tablet (upadacitinib ) : 4 days of medicine on hand       Specialty medication is an injection or given on a cycle: No    REFERRAL TO PHARMACIST     Referral to the pharmacist: Not needed      [facility]     Shipping address confirmed in Epic.     Cost and Payment: Patient has a $0 copay, payment information is not required.    Delivery Scheduled: Yes, Expected medication delivery date: 07/30/2024.     Medication will be delivered via Next Day Courier to the prescription address in Epic WAM.     [name]   [facility]  Specialty Technician

## 2024-07-25 NOTE — Patient Instructions (Signed)
 We Want to Hear From You!    After your visit, you may receive a formal survey about your experience. If so, please complete it as we pay close attention to your feedback and comments. This survey also helps us  recognize members of our Care Team who are doing an excellent job!

## 2024-07-25 NOTE — Progress Notes (Signed)
 Cystoscopy (male)  Time out was performed immediately prior to the procedure.    The patient was prepped and draped in the usual sterile fashion.  Flexible cystosopy was performed.  The urethra was normal.  The bladder urothelium inspectge.  Diffuse erythema with bullous edema c/w irritation from UTI.  Directed cytlogy obtainbed  The ureteral orifices were visualized bilaterally, and efflux was clear.  Left UO was more lateral than anticipated  The patient tolerated the procedure well and was given one dose of antibiotic prophylaxis afterwards.    Specimen:      Plan:  Daily nitrofurantoin   Follow up cytology  Repeat cysto in 3 months

## 2024-07-25 NOTE — Telephone Encounter (Signed)
 Encounter for refill request:  Last clinic visit: 06/25/2024  Colonoscopy:   Appointments which have been scheduled for you      Dec 24, 2024 10:00 AM  (Arrive by 9:45 AM)  RETURN IBD with Rodger Phlegm, MD  [facility] [place] [facility]) [address]  [facility] [address]  [place] [address]  [phone]       Lab Results   Component Value Date    WBC 14.0 (H) 06/25/2024    RBC 4.46 06/25/2024    HGB 13.4 06/25/2024    HCT 40.0 06/25/2024    PLT 233 06/25/2024    ALT 18 06/25/2024    AST 25 06/25/2024    ALKPHOS 71 06/25/2024    CRP 21.0 (H) 12/27/2022    CREATININE 0.93 06/25/2024       Rinvoq  refills authorized x 3 month supply.
